# Patient Record
Sex: Male | Born: 1950 | Race: Black or African American | Hispanic: No | Marital: Married | Smoking: Never smoker
Health system: Southern US, Community
[De-identification: ages and names within clinical notes are randomized; demographics above are authoritative.]

## PROBLEM LIST (undated history)

## (undated) DIAGNOSIS — I639 Cerebral infarction, unspecified: Secondary | ICD-10-CM

## (undated) DIAGNOSIS — R569 Unspecified convulsions: Secondary | ICD-10-CM

## (undated) DIAGNOSIS — I10 Essential (primary) hypertension: Secondary | ICD-10-CM

## (undated) DIAGNOSIS — G931 Anoxic brain damage, not elsewhere classified: Secondary | ICD-10-CM

## (undated) DIAGNOSIS — J811 Chronic pulmonary edema: Secondary | ICD-10-CM

## (undated) DIAGNOSIS — I82409 Acute embolism and thrombosis of unspecified deep veins of unspecified lower extremity: Secondary | ICD-10-CM

## (undated) DIAGNOSIS — N189 Chronic kidney disease, unspecified: Secondary | ICD-10-CM

## (undated) DIAGNOSIS — M109 Gout, unspecified: Secondary | ICD-10-CM

## (undated) DIAGNOSIS — G8192 Hemiplegia, unspecified affecting left dominant side: Secondary | ICD-10-CM

## (undated) DIAGNOSIS — D649 Anemia, unspecified: Secondary | ICD-10-CM

## (undated) DIAGNOSIS — D472 Monoclonal gammopathy: Secondary | ICD-10-CM

## (undated) DIAGNOSIS — Z8673 Personal history of transient ischemic attack (TIA), and cerebral infarction without residual deficits: Secondary | ICD-10-CM

## (undated) DIAGNOSIS — D696 Thrombocytopenia, unspecified: Secondary | ICD-10-CM

## (undated) DIAGNOSIS — E785 Hyperlipidemia, unspecified: Secondary | ICD-10-CM

## (undated) DIAGNOSIS — R1310 Dysphagia, unspecified: Secondary | ICD-10-CM

## (undated) DIAGNOSIS — F1010 Alcohol abuse, uncomplicated: Secondary | ICD-10-CM

## (undated) HISTORY — DX: Anoxic brain damage, not elsewhere classified: G93.1

## (undated) HISTORY — DX: Dysphagia, unspecified: R13.10

## (undated) HISTORY — DX: Hemiplegia, unspecified affecting left dominant side: G81.92

## (undated) HISTORY — DX: Anemia, unspecified: D64.9

## (undated) HISTORY — DX: Thrombocytopenia, unspecified: D69.6

## (undated) HISTORY — DX: Chronic pulmonary edema: J81.1

## (undated) HISTORY — DX: Chronic kidney disease, unspecified: N18.9

## (undated) HISTORY — PX: OTHER SURGICAL HISTORY: SHX169

## (undated) HISTORY — DX: Unspecified convulsions: R56.9

## (undated) HISTORY — DX: Personal history of transient ischemic attack (TIA), and cerebral infarction without residual deficits: Z86.73

## (undated) HISTORY — DX: Cerebral infarction, unspecified: I63.9

---

## 1999-02-20 ENCOUNTER — Encounter: Admission: RE | Admit: 1999-02-20 | Discharge: 1999-02-20 | Payer: Self-pay | Admitting: Family Medicine

## 1999-03-03 ENCOUNTER — Encounter: Admission: RE | Admit: 1999-03-03 | Discharge: 1999-03-03 | Payer: Self-pay | Admitting: Family Medicine

## 1999-12-04 ENCOUNTER — Encounter: Admission: RE | Admit: 1999-12-04 | Discharge: 1999-12-04 | Payer: Self-pay | Admitting: Family Medicine

## 1999-12-21 ENCOUNTER — Encounter: Admission: RE | Admit: 1999-12-21 | Discharge: 1999-12-21 | Payer: Self-pay | Admitting: Family Medicine

## 2000-01-04 ENCOUNTER — Encounter: Admission: RE | Admit: 2000-01-04 | Discharge: 2000-01-04 | Payer: Self-pay | Admitting: Family Medicine

## 2000-03-28 ENCOUNTER — Encounter: Admission: RE | Admit: 2000-03-28 | Discharge: 2000-03-28 | Payer: Self-pay | Admitting: Family Medicine

## 2000-04-11 ENCOUNTER — Encounter: Admission: RE | Admit: 2000-04-11 | Discharge: 2000-04-11 | Payer: Self-pay | Admitting: Family Medicine

## 2000-04-18 ENCOUNTER — Encounter: Admission: RE | Admit: 2000-04-18 | Discharge: 2000-04-18 | Payer: Self-pay | Admitting: Family Medicine

## 2001-03-28 ENCOUNTER — Encounter: Admission: RE | Admit: 2001-03-28 | Discharge: 2001-03-28 | Payer: Self-pay | Admitting: Sports Medicine

## 2002-01-22 ENCOUNTER — Encounter: Admission: RE | Admit: 2002-01-22 | Discharge: 2002-01-22 | Payer: Self-pay | Admitting: Sports Medicine

## 2002-07-18 ENCOUNTER — Encounter: Admission: RE | Admit: 2002-07-18 | Discharge: 2002-07-18 | Payer: Self-pay | Admitting: Sports Medicine

## 2002-10-11 ENCOUNTER — Encounter: Admission: RE | Admit: 2002-10-11 | Discharge: 2002-10-11 | Payer: Self-pay | Admitting: Family Medicine

## 2002-11-12 ENCOUNTER — Encounter: Admission: RE | Admit: 2002-11-12 | Discharge: 2002-11-12 | Payer: Self-pay | Admitting: Family Medicine

## 2002-11-19 ENCOUNTER — Encounter: Admission: RE | Admit: 2002-11-19 | Discharge: 2002-11-19 | Payer: Self-pay | Admitting: Family Medicine

## 2003-09-16 ENCOUNTER — Encounter: Admission: RE | Admit: 2003-09-16 | Discharge: 2003-09-16 | Payer: Self-pay | Admitting: Family Medicine

## 2004-01-02 ENCOUNTER — Encounter: Admission: RE | Admit: 2004-01-02 | Discharge: 2004-01-02 | Payer: Self-pay | Admitting: Family Medicine

## 2004-08-27 ENCOUNTER — Ambulatory Visit: Payer: Self-pay | Admitting: Family Medicine

## 2005-03-08 ENCOUNTER — Ambulatory Visit: Payer: Self-pay | Admitting: Family Medicine

## 2005-03-22 ENCOUNTER — Ambulatory Visit: Payer: Self-pay | Admitting: Family Medicine

## 2005-08-30 ENCOUNTER — Ambulatory Visit: Payer: Self-pay | Admitting: Family Medicine

## 2006-08-12 ENCOUNTER — Ambulatory Visit: Payer: Self-pay | Admitting: Family Medicine

## 2006-09-02 ENCOUNTER — Ambulatory Visit: Payer: Self-pay | Admitting: Family Medicine

## 2006-09-07 ENCOUNTER — Ambulatory Visit: Payer: Self-pay | Admitting: Sports Medicine

## 2006-11-24 DIAGNOSIS — I10 Essential (primary) hypertension: Secondary | ICD-10-CM | POA: Insufficient documentation

## 2006-11-24 DIAGNOSIS — M109 Gout, unspecified: Secondary | ICD-10-CM | POA: Insufficient documentation

## 2006-11-24 DIAGNOSIS — K5730 Diverticulosis of large intestine without perforation or abscess without bleeding: Secondary | ICD-10-CM | POA: Insufficient documentation

## 2006-11-24 DIAGNOSIS — E785 Hyperlipidemia, unspecified: Secondary | ICD-10-CM | POA: Insufficient documentation

## 2006-11-24 DIAGNOSIS — K649 Unspecified hemorrhoids: Secondary | ICD-10-CM | POA: Insufficient documentation

## 2007-01-04 ENCOUNTER — Ambulatory Visit: Payer: Self-pay | Admitting: Sports Medicine

## 2007-01-04 DIAGNOSIS — N390 Urinary tract infection, site not specified: Secondary | ICD-10-CM | POA: Insufficient documentation

## 2007-01-04 DIAGNOSIS — N400 Enlarged prostate without lower urinary tract symptoms: Secondary | ICD-10-CM | POA: Insufficient documentation

## 2007-01-09 ENCOUNTER — Encounter: Admission: RE | Admit: 2007-01-09 | Discharge: 2007-01-09 | Payer: Self-pay | Admitting: Sports Medicine

## 2007-01-10 ENCOUNTER — Telehealth: Payer: Self-pay | Admitting: *Deleted

## 2007-06-05 ENCOUNTER — Encounter (INDEPENDENT_AMBULATORY_CARE_PROVIDER_SITE_OTHER): Payer: Self-pay | Admitting: Family Medicine

## 2007-06-05 LAB — CONVERTED CEMR LAB
ALT: 20 units/L
AST: 28 units/L
Creatinine, Ser: 1.2 mg/dL
HCT: 35.5 %
Hemoglobin: 12.2 g/dL

## 2007-08-04 ENCOUNTER — Telehealth (INDEPENDENT_AMBULATORY_CARE_PROVIDER_SITE_OTHER): Payer: Self-pay | Admitting: Family Medicine

## 2007-08-30 ENCOUNTER — Encounter (INDEPENDENT_AMBULATORY_CARE_PROVIDER_SITE_OTHER): Payer: Self-pay | Admitting: Family Medicine

## 2012-12-04 ENCOUNTER — Ambulatory Visit
Admission: RE | Admit: 2012-12-04 | Discharge: 2012-12-04 | Disposition: A | Discharge status: Home / Self Care | Payer: PRIVATE HEALTH INSURANCE | Source: Ambulatory Visit | Attending: Internal Medicine | Admitting: Internal Medicine

## 2012-12-04 ENCOUNTER — Other Ambulatory Visit: Payer: Self-pay | Admitting: Internal Medicine

## 2012-12-04 DIAGNOSIS — R609 Edema, unspecified: Secondary | ICD-10-CM

## 2013-07-19 ENCOUNTER — Ambulatory Visit
Admission: RE | Admit: 2013-07-19 | Discharge: 2013-07-19 | Disposition: A | Discharge status: Home / Self Care | Payer: PRIVATE HEALTH INSURANCE | Source: Ambulatory Visit | Attending: Family Medicine | Admitting: Family Medicine

## 2013-07-19 ENCOUNTER — Other Ambulatory Visit: Payer: Self-pay | Admitting: Family Medicine

## 2013-07-19 DIAGNOSIS — Z86718 Personal history of other venous thrombosis and embolism: Secondary | ICD-10-CM

## 2014-10-31 DIAGNOSIS — H2513 Age-related nuclear cataract, bilateral: Secondary | ICD-10-CM | POA: Diagnosis not present

## 2014-10-31 DIAGNOSIS — H4011X1 Primary open-angle glaucoma, mild stage: Secondary | ICD-10-CM | POA: Diagnosis not present

## 2014-11-25 DIAGNOSIS — Z7901 Long term (current) use of anticoagulants: Secondary | ICD-10-CM | POA: Diagnosis not present

## 2014-11-25 DIAGNOSIS — I10 Essential (primary) hypertension: Secondary | ICD-10-CM | POA: Diagnosis not present

## 2014-11-25 DIAGNOSIS — N182 Chronic kidney disease, stage 2 (mild): Secondary | ICD-10-CM | POA: Diagnosis not present

## 2014-12-17 DIAGNOSIS — R799 Abnormal finding of blood chemistry, unspecified: Secondary | ICD-10-CM | POA: Diagnosis not present

## 2014-12-17 DIAGNOSIS — Z79899 Other long term (current) drug therapy: Secondary | ICD-10-CM | POA: Diagnosis not present

## 2014-12-17 DIAGNOSIS — M150 Primary generalized (osteo)arthritis: Secondary | ICD-10-CM | POA: Diagnosis not present

## 2014-12-17 DIAGNOSIS — M1A09X1 Idiopathic chronic gout, multiple sites, with tophus (tophi): Secondary | ICD-10-CM | POA: Diagnosis not present

## 2014-12-17 DIAGNOSIS — N189 Chronic kidney disease, unspecified: Secondary | ICD-10-CM | POA: Diagnosis not present

## 2014-12-17 DIAGNOSIS — M5136 Other intervertebral disc degeneration, lumbar region: Secondary | ICD-10-CM | POA: Diagnosis not present

## 2014-12-17 DIAGNOSIS — Z789 Other specified health status: Secondary | ICD-10-CM | POA: Diagnosis not present

## 2015-01-21 DIAGNOSIS — M1A9XX1 Chronic gout, unspecified, with tophus (tophi): Secondary | ICD-10-CM | POA: Diagnosis not present

## 2015-01-28 DIAGNOSIS — I129 Hypertensive chronic kidney disease with stage 1 through stage 4 chronic kidney disease, or unspecified chronic kidney disease: Secondary | ICD-10-CM | POA: Diagnosis not present

## 2015-01-28 DIAGNOSIS — N182 Chronic kidney disease, stage 2 (mild): Secondary | ICD-10-CM | POA: Diagnosis not present

## 2015-01-28 DIAGNOSIS — I10 Essential (primary) hypertension: Secondary | ICD-10-CM | POA: Diagnosis not present

## 2015-02-10 DIAGNOSIS — D649 Anemia, unspecified: Secondary | ICD-10-CM | POA: Diagnosis not present

## 2015-02-10 DIAGNOSIS — I10 Essential (primary) hypertension: Secondary | ICD-10-CM | POA: Diagnosis not present

## 2015-02-10 DIAGNOSIS — N183 Chronic kidney disease, stage 3 (moderate): Secondary | ICD-10-CM | POA: Diagnosis not present

## 2015-03-18 DIAGNOSIS — N183 Chronic kidney disease, stage 3 (moderate): Secondary | ICD-10-CM | POA: Diagnosis not present

## 2015-03-18 DIAGNOSIS — I10 Essential (primary) hypertension: Secondary | ICD-10-CM | POA: Diagnosis not present

## 2015-03-18 DIAGNOSIS — R197 Diarrhea, unspecified: Secondary | ICD-10-CM | POA: Diagnosis not present

## 2015-03-20 DIAGNOSIS — R197 Diarrhea, unspecified: Secondary | ICD-10-CM | POA: Diagnosis not present

## 2015-03-26 DIAGNOSIS — E785 Hyperlipidemia, unspecified: Secondary | ICD-10-CM | POA: Diagnosis not present

## 2015-03-26 DIAGNOSIS — E786 Lipoprotein deficiency: Secondary | ICD-10-CM | POA: Diagnosis not present

## 2015-03-26 DIAGNOSIS — R197 Diarrhea, unspecified: Secondary | ICD-10-CM | POA: Diagnosis not present

## 2015-04-23 DIAGNOSIS — M1A09X1 Idiopathic chronic gout, multiple sites, with tophus (tophi): Secondary | ICD-10-CM | POA: Diagnosis not present

## 2015-04-23 DIAGNOSIS — N189 Chronic kidney disease, unspecified: Secondary | ICD-10-CM | POA: Diagnosis not present

## 2015-04-23 DIAGNOSIS — M150 Primary generalized (osteo)arthritis: Secondary | ICD-10-CM | POA: Diagnosis not present

## 2015-04-23 DIAGNOSIS — M5136 Other intervertebral disc degeneration, lumbar region: Secondary | ICD-10-CM | POA: Diagnosis not present

## 2015-06-30 DIAGNOSIS — R197 Diarrhea, unspecified: Secondary | ICD-10-CM | POA: Diagnosis not present

## 2015-06-30 DIAGNOSIS — R195 Other fecal abnormalities: Secondary | ICD-10-CM | POA: Diagnosis not present

## 2015-06-30 DIAGNOSIS — Z23 Encounter for immunization: Secondary | ICD-10-CM | POA: Diagnosis not present

## 2015-06-30 DIAGNOSIS — I10 Essential (primary) hypertension: Secondary | ICD-10-CM | POA: Diagnosis not present

## 2015-07-08 ENCOUNTER — Inpatient Hospital Stay (HOSPITAL_COMMUNITY): Payer: Medicare Other

## 2015-07-08 ENCOUNTER — Inpatient Hospital Stay (HOSPITAL_COMMUNITY)
Admission: EM | Admit: 2015-07-08 | Discharge: 2015-07-19 | DRG: 308 | Disposition: A | Discharge status: Skilled Nursing Facility | Payer: Medicare Other | Attending: Internal Medicine | Admitting: Internal Medicine

## 2015-07-08 ENCOUNTER — Emergency Department (HOSPITAL_COMMUNITY): Payer: Medicare Other

## 2015-07-08 ENCOUNTER — Encounter (HOSPITAL_COMMUNITY): Payer: Self-pay | Admitting: Emergency Medicine

## 2015-07-08 DIAGNOSIS — M1000 Idiopathic gout, unspecified site: Secondary | ICD-10-CM | POA: Diagnosis present

## 2015-07-08 DIAGNOSIS — R9431 Abnormal electrocardiogram [ECG] [EKG]: Secondary | ICD-10-CM | POA: Diagnosis not present

## 2015-07-08 DIAGNOSIS — J9691 Respiratory failure, unspecified with hypoxia: Secondary | ICD-10-CM | POA: Diagnosis not present

## 2015-07-08 DIAGNOSIS — Z4659 Encounter for fitting and adjustment of other gastrointestinal appliance and device: Secondary | ICD-10-CM

## 2015-07-08 DIAGNOSIS — J189 Pneumonia, unspecified organism: Secondary | ICD-10-CM | POA: Diagnosis present

## 2015-07-08 DIAGNOSIS — F1010 Alcohol abuse, uncomplicated: Secondary | ICD-10-CM | POA: Diagnosis present

## 2015-07-08 DIAGNOSIS — G931 Anoxic brain damage, not elsewhere classified: Secondary | ICD-10-CM | POA: Diagnosis present

## 2015-07-08 DIAGNOSIS — R1310 Dysphagia, unspecified: Secondary | ICD-10-CM | POA: Diagnosis not present

## 2015-07-08 DIAGNOSIS — J810 Acute pulmonary edema: Secondary | ICD-10-CM | POA: Diagnosis not present

## 2015-07-08 DIAGNOSIS — I639 Cerebral infarction, unspecified: Secondary | ICD-10-CM

## 2015-07-08 DIAGNOSIS — Z888 Allergy status to other drugs, medicaments and biological substances status: Secondary | ICD-10-CM | POA: Diagnosis not present

## 2015-07-08 DIAGNOSIS — I429 Cardiomyopathy, unspecified: Secondary | ICD-10-CM | POA: Diagnosis present

## 2015-07-08 DIAGNOSIS — N179 Acute kidney failure, unspecified: Secondary | ICD-10-CM | POA: Diagnosis not present

## 2015-07-08 DIAGNOSIS — D696 Thrombocytopenia, unspecified: Secondary | ICD-10-CM | POA: Diagnosis present

## 2015-07-08 DIAGNOSIS — G9340 Encephalopathy, unspecified: Secondary | ICD-10-CM | POA: Diagnosis not present

## 2015-07-08 DIAGNOSIS — Y848 Other medical procedures as the cause of abnormal reaction of the patient, or of later complication, without mention of misadventure at the time of the procedure: Secondary | ICD-10-CM | POA: Diagnosis present

## 2015-07-08 DIAGNOSIS — I468 Cardiac arrest due to other underlying condition: Secondary | ICD-10-CM | POA: Diagnosis present

## 2015-07-08 DIAGNOSIS — F17200 Nicotine dependence, unspecified, uncomplicated: Secondary | ICD-10-CM | POA: Diagnosis present

## 2015-07-08 DIAGNOSIS — E785 Hyperlipidemia, unspecified: Secondary | ICD-10-CM | POA: Diagnosis not present

## 2015-07-08 DIAGNOSIS — Z86718 Personal history of other venous thrombosis and embolism: Secondary | ICD-10-CM | POA: Diagnosis not present

## 2015-07-08 DIAGNOSIS — Z9289 Personal history of other medical treatment: Secondary | ICD-10-CM

## 2015-07-08 DIAGNOSIS — R0902 Hypoxemia: Secondary | ICD-10-CM

## 2015-07-08 DIAGNOSIS — I5022 Chronic systolic (congestive) heart failure: Secondary | ICD-10-CM | POA: Diagnosis present

## 2015-07-08 DIAGNOSIS — T8182XA Emphysema (subcutaneous) resulting from a procedure, initial encounter: Secondary | ICD-10-CM | POA: Diagnosis not present

## 2015-07-08 DIAGNOSIS — G8194 Hemiplegia, unspecified affecting left nondominant side: Secondary | ICD-10-CM | POA: Diagnosis not present

## 2015-07-08 DIAGNOSIS — E872 Acidosis: Secondary | ICD-10-CM | POA: Diagnosis present

## 2015-07-08 DIAGNOSIS — Y92019 Unspecified place in single-family (private) house as the place of occurrence of the external cause: Secondary | ICD-10-CM

## 2015-07-08 DIAGNOSIS — I10 Essential (primary) hypertension: Secondary | ICD-10-CM | POA: Diagnosis present

## 2015-07-08 DIAGNOSIS — J9600 Acute respiratory failure, unspecified whether with hypoxia or hypercapnia: Secondary | ICD-10-CM | POA: Diagnosis not present

## 2015-07-08 DIAGNOSIS — D62 Acute posthemorrhagic anemia: Secondary | ICD-10-CM | POA: Diagnosis present

## 2015-07-08 DIAGNOSIS — I469 Cardiac arrest, cause unspecified: Secondary | ICD-10-CM

## 2015-07-08 DIAGNOSIS — E870 Hyperosmolality and hypernatremia: Secondary | ICD-10-CM | POA: Diagnosis not present

## 2015-07-08 DIAGNOSIS — S11023A Puncture wound without foreign body of trachea, initial encounter: Secondary | ICD-10-CM | POA: Diagnosis present

## 2015-07-08 DIAGNOSIS — Z79899 Other long term (current) drug therapy: Secondary | ICD-10-CM | POA: Diagnosis not present

## 2015-07-08 DIAGNOSIS — I4901 Ventricular fibrillation: Secondary | ICD-10-CM | POA: Diagnosis not present

## 2015-07-08 DIAGNOSIS — M109 Gout, unspecified: Secondary | ICD-10-CM | POA: Diagnosis present

## 2015-07-08 DIAGNOSIS — Z452 Encounter for adjustment and management of vascular access device: Secondary | ICD-10-CM | POA: Diagnosis not present

## 2015-07-08 DIAGNOSIS — Z7901 Long term (current) use of anticoagulants: Secondary | ICD-10-CM

## 2015-07-08 DIAGNOSIS — Z4682 Encounter for fitting and adjustment of non-vascular catheter: Secondary | ICD-10-CM | POA: Diagnosis not present

## 2015-07-08 DIAGNOSIS — Q321 Other congenital malformations of trachea: Secondary | ICD-10-CM | POA: Diagnosis not present

## 2015-07-08 DIAGNOSIS — E876 Hypokalemia: Secondary | ICD-10-CM | POA: Diagnosis not present

## 2015-07-08 DIAGNOSIS — N170 Acute kidney failure with tubular necrosis: Secondary | ICD-10-CM | POA: Diagnosis not present

## 2015-07-08 DIAGNOSIS — J9601 Acute respiratory failure with hypoxia: Secondary | ICD-10-CM | POA: Diagnosis not present

## 2015-07-08 DIAGNOSIS — T797XXA Traumatic subcutaneous emphysema, initial encounter: Secondary | ICD-10-CM | POA: Diagnosis not present

## 2015-07-08 DIAGNOSIS — R4182 Altered mental status, unspecified: Secondary | ICD-10-CM | POA: Diagnosis not present

## 2015-07-08 DIAGNOSIS — K7200 Acute and subacute hepatic failure without coma: Secondary | ICD-10-CM | POA: Diagnosis present

## 2015-07-08 DIAGNOSIS — J811 Chronic pulmonary edema: Secondary | ICD-10-CM

## 2015-07-08 DIAGNOSIS — J681 Pulmonary edema due to chemicals, gases, fumes and vapors: Secondary | ICD-10-CM | POA: Diagnosis not present

## 2015-07-08 DIAGNOSIS — I130 Hypertensive heart and chronic kidney disease with heart failure and stage 1 through stage 4 chronic kidney disease, or unspecified chronic kidney disease: Secondary | ICD-10-CM | POA: Diagnosis present

## 2015-07-08 DIAGNOSIS — J398 Other specified diseases of upper respiratory tract: Secondary | ICD-10-CM | POA: Diagnosis present

## 2015-07-08 DIAGNOSIS — T884XXA Failed or difficult intubation, initial encounter: Secondary | ICD-10-CM | POA: Diagnosis present

## 2015-07-08 DIAGNOSIS — R092 Respiratory arrest: Secondary | ICD-10-CM | POA: Diagnosis not present

## 2015-07-08 DIAGNOSIS — E7800 Pure hypercholesterolemia, unspecified: Secondary | ICD-10-CM | POA: Diagnosis not present

## 2015-07-08 DIAGNOSIS — J690 Pneumonitis due to inhalation of food and vomit: Secondary | ICD-10-CM | POA: Diagnosis not present

## 2015-07-08 HISTORY — DX: Essential (primary) hypertension: I10

## 2015-07-08 HISTORY — DX: Gout, unspecified: M10.9

## 2015-07-08 HISTORY — DX: Hyperlipidemia, unspecified: E78.5

## 2015-07-08 HISTORY — DX: Cardiac arrest, cause unspecified: I46.9

## 2015-07-08 HISTORY — DX: Alcohol abuse, uncomplicated: F10.10

## 2015-07-08 LAB — CBC WITH DIFFERENTIAL/PLATELET
BASOS ABS: 0 10*3/uL (ref 0.0–0.1)
Basophils Relative: 0 %
EOS PCT: 1 %
Eosinophils Absolute: 0.1 10*3/uL (ref 0.0–0.7)
HCT: 32.8 % — ABNORMAL LOW (ref 39.0–52.0)
Hemoglobin: 10.9 g/dL — ABNORMAL LOW (ref 13.0–17.0)
LYMPHS PCT: 35 %
Lymphs Abs: 3.1 10*3/uL (ref 0.7–4.0)
MCH: 31.7 pg (ref 26.0–34.0)
MCHC: 33.2 g/dL (ref 30.0–36.0)
MCV: 95.3 fL (ref 78.0–100.0)
MONO ABS: 0.6 10*3/uL (ref 0.1–1.0)
Monocytes Relative: 7 %
Neutro Abs: 4.9 10*3/uL (ref 1.7–7.7)
Neutrophils Relative %: 57 %
PLATELETS: 131 10*3/uL — AB (ref 150–400)
RBC: 3.44 MIL/uL — ABNORMAL LOW (ref 4.22–5.81)
RDW: 14.5 % (ref 11.5–15.5)
WBC: 8.7 10*3/uL (ref 4.0–10.5)

## 2015-07-08 LAB — COMPREHENSIVE METABOLIC PANEL
ALT: 34 U/L (ref 17–63)
ANION GAP: 16 — AB (ref 5–15)
AST: 229 U/L — AB (ref 15–41)
Albumin: 2.6 g/dL — ABNORMAL LOW (ref 3.5–5.0)
Alkaline Phosphatase: 99 U/L (ref 38–126)
BUN: 8 mg/dL (ref 6–20)
CHLORIDE: 105 mmol/L (ref 101–111)
CO2: 19 mmol/L — ABNORMAL LOW (ref 22–32)
Calcium: 8.1 mg/dL — ABNORMAL LOW (ref 8.9–10.3)
Creatinine, Ser: 1.65 mg/dL — ABNORMAL HIGH (ref 0.61–1.24)
GFR, EST AFRICAN AMERICAN: 49 mL/min — AB (ref >60)
GFR, EST NON AFRICAN AMERICAN: 42 mL/min — AB (ref >60)
Glucose, Bld: 160 mg/dL — ABNORMAL HIGH (ref 65–99)
POTASSIUM: 3.7 mmol/L (ref 3.5–5.1)
Sodium: 140 mmol/L (ref 135–145)
Total Bilirubin: 0.7 mg/dL (ref 0.3–1.2)
Total Protein: 6.2 g/dL — ABNORMAL LOW (ref 6.5–8.1)

## 2015-07-08 LAB — I-STAT CHEM 8, ED
BUN: 8 mg/dL (ref 6–20)
CALCIUM ION: 1.03 mmol/L — AB (ref 1.13–1.30)
Chloride: 107 mmol/L (ref 101–111)
Creatinine, Ser: 1.6 mg/dL — ABNORMAL HIGH (ref 0.61–1.24)
GLUCOSE: 163 mg/dL — AB (ref 65–99)
HCT: 36 % — ABNORMAL LOW (ref 39.0–52.0)
HEMOGLOBIN: 12.2 g/dL — AB (ref 13.0–17.0)
Potassium: 3.7 mmol/L (ref 3.5–5.1)
Sodium: 141 mmol/L (ref 135–145)
TCO2: 17 mmol/L (ref 0–100)

## 2015-07-08 LAB — POCT I-STAT, CHEM 8
BUN: 7 mg/dL (ref 6–20)
BUN: 6 mg/dL (ref 6–20)
BUN: 7 mg/dL (ref 6–20)
BUN: 6 mg/dL (ref 6–20)
CALCIUM ION: 1.05 mmol/L — AB (ref 1.13–1.30)
CALCIUM ION: 0.98 mmol/L — AB (ref 1.13–1.30)
CHLORIDE: 112 mmol/L — AB (ref 101–111)
CHLORIDE: 110 mmol/L (ref 101–111)
CREATININE: 1.3 mg/dL — AB (ref 0.61–1.24)
CREATININE: 1 mg/dL (ref 0.61–1.24)
Calcium, Ion: 1.01 mmol/L — ABNORMAL LOW (ref 1.13–1.30)
Calcium, Ion: 1 mmol/L — ABNORMAL LOW (ref 1.13–1.30)
Chloride: 108 mmol/L (ref 101–111)
Chloride: 115 mmol/L — ABNORMAL HIGH (ref 101–111)
Creatinine, Ser: 1 mg/dL (ref 0.61–1.24)
Creatinine, Ser: 1 mg/dL (ref 0.61–1.24)
Glucose, Bld: 119 mg/dL — ABNORMAL HIGH (ref 65–99)
Glucose, Bld: 199 mg/dL — ABNORMAL HIGH (ref 65–99)
Glucose, Bld: 148 mg/dL — ABNORMAL HIGH (ref 65–99)
Glucose, Bld: 114 mg/dL — ABNORMAL HIGH (ref 65–99)
HCT: 37 % — ABNORMAL LOW (ref 39.0–52.0)
HCT: 35 % — ABNORMAL LOW (ref 39.0–52.0)
HEMATOCRIT: 33 % — AB (ref 39.0–52.0)
HEMATOCRIT: 36 % — AB (ref 39.0–52.0)
HEMOGLOBIN: 11.2 g/dL — AB (ref 13.0–17.0)
Hemoglobin: 12.6 g/dL — ABNORMAL LOW (ref 13.0–17.0)
Hemoglobin: 12.2 g/dL — ABNORMAL LOW (ref 13.0–17.0)
Hemoglobin: 11.9 g/dL — ABNORMAL LOW (ref 13.0–17.0)
POTASSIUM: 4.2 mmol/L (ref 3.5–5.1)
POTASSIUM: 3.6 mmol/L (ref 3.5–5.1)
Potassium: 3.6 mmol/L (ref 3.5–5.1)
Potassium: 3.4 mmol/L — ABNORMAL LOW (ref 3.5–5.1)
SODIUM: 141 mmol/L (ref 135–145)
SODIUM: 142 mmol/L (ref 135–145)
SODIUM: 139 mmol/L (ref 135–145)
Sodium: 142 mmol/L (ref 135–145)
TCO2: 18 mmol/L (ref 0–100)
TCO2: 15 mmol/L (ref 0–100)
TCO2: 14 mmol/L (ref 0–100)
TCO2: 16 mmol/L (ref 0–100)

## 2015-07-08 LAB — I-STAT ARTERIAL BLOOD GAS, ED
Acid-base deficit: 8 mmol/L — ABNORMAL HIGH (ref 0.0–2.0)
Bicarbonate: 19.4 mEq/L — ABNORMAL LOW (ref 20.0–24.0)
O2 Saturation: 100 %
PCO2 ART: 44.7 mmHg (ref 35.0–45.0)
TCO2: 21 mmol/L (ref 0–100)
pH, Arterial: 7.247 — ABNORMAL LOW (ref 7.350–7.450)
pO2, Arterial: 347 mmHg — ABNORMAL HIGH (ref 80.0–100.0)

## 2015-07-08 LAB — APTT: aPTT: 34 seconds (ref 24–37)

## 2015-07-08 LAB — BLOOD GAS, ARTERIAL
Acid-base deficit: 11.7 mmol/L — ABNORMAL HIGH (ref 0.0–2.0)
BICARBONATE: 12.6 meq/L — AB (ref 20.0–24.0)
Drawn by: 338991
FIO2: 0.5
MECHVT: 550 mL
O2 Saturation: 99.3 %
PATIENT TEMPERATURE: 95.5
PEEP: 5 cmH2O
RATE: 28 resp/min
TCO2: 13.3 mmol/L (ref 0–100)
pCO2 arterial: 20.4 mmHg — ABNORMAL LOW (ref 35.0–45.0)
pH, Arterial: 7.397 (ref 7.350–7.450)
pO2, Arterial: 218 mmHg — ABNORMAL HIGH (ref 80.0–100.0)

## 2015-07-08 LAB — PROTIME-INR
INR: 1.25 (ref 0.00–1.49)
INR: 1.32 (ref 0.00–1.49)
PROTHROMBIN TIME: 16.5 s — AB (ref 11.6–15.2)
Prothrombin Time: 15.8 seconds — ABNORMAL HIGH (ref 11.6–15.2)

## 2015-07-08 LAB — BASIC METABOLIC PANEL
Anion gap: 13 (ref 5–15)
BUN: 7 mg/dL (ref 6–20)
CHLORIDE: 106 mmol/L (ref 101–111)
CO2: 17 mmol/L — ABNORMAL LOW (ref 22–32)
Calcium: 7 mg/dL — ABNORMAL LOW (ref 8.9–10.3)
Creatinine, Ser: 1.25 mg/dL — ABNORMAL HIGH (ref 0.61–1.24)
GFR calc Af Amer: >60 mL/min (ref >60)
GFR calc non Af Amer: 59 mL/min — ABNORMAL LOW (ref >60)
GLUCOSE: 113 mg/dL — AB (ref 65–99)
POTASSIUM: 3.1 mmol/L — AB (ref 3.5–5.1)
Sodium: 136 mmol/L (ref 135–145)

## 2015-07-08 LAB — TROPONIN I
TROPONIN I: 0.37 ng/mL — AB (ref <0.031)
Troponin I: <0.03 ng/mL (ref <0.031)

## 2015-07-08 LAB — MAGNESIUM: MAGNESIUM: 1 mg/dL — AB (ref 1.7–2.4)

## 2015-07-08 LAB — GLUCOSE, CAPILLARY
GLUCOSE-CAPILLARY: 164 mg/dL — AB (ref 65–99)
GLUCOSE-CAPILLARY: 164 mg/dL — AB (ref 65–99)
GLUCOSE-CAPILLARY: 108 mg/dL — AB (ref 65–99)
Glucose-Capillary: 113 mg/dL — ABNORMAL HIGH (ref 65–99)
Glucose-Capillary: 155 mg/dL — ABNORMAL HIGH (ref 65–99)
Glucose-Capillary: 131 mg/dL — ABNORMAL HIGH (ref 65–99)
Glucose-Capillary: 99 mg/dL (ref 65–99)
Glucose-Capillary: 102 mg/dL — ABNORMAL HIGH (ref 65–99)
Glucose-Capillary: 108 mg/dL — ABNORMAL HIGH (ref 65–99)

## 2015-07-08 LAB — I-STAT CG4 LACTIC ACID, ED: LACTIC ACID, VENOUS: 6.4 mmol/L — AB (ref 0.5–2.0)

## 2015-07-08 LAB — RAPID URINE DRUG SCREEN, HOSP PERFORMED
AMPHETAMINES: NOT DETECTED
BARBITURATES: NOT DETECTED
Benzodiazepines: NOT DETECTED
Cocaine: NOT DETECTED
Opiates: NOT DETECTED
Tetrahydrocannabinol: NOT DETECTED

## 2015-07-08 LAB — URINALYSIS, ROUTINE W REFLEX MICROSCOPIC
Bilirubin Urine: NEGATIVE
Glucose, UA: 100 mg/dL — AB
KETONES UR: NEGATIVE mg/dL
LEUKOCYTES UA: NEGATIVE
Nitrite: NEGATIVE
Specific Gravity, Urine: 1.012 (ref 1.005–1.030)
UROBILINOGEN UA: 0.2 mg/dL (ref 0.0–1.0)
pH: 6.5 (ref 5.0–8.0)

## 2015-07-08 LAB — ETHANOL

## 2015-07-08 LAB — I-STAT TROPONIN, ED: TROPONIN I, POC: 0 ng/mL (ref 0.00–0.08)

## 2015-07-08 LAB — CBG MONITORING, ED: GLUCOSE-CAPILLARY: 158 mg/dL — AB (ref 65–99)

## 2015-07-08 LAB — URINE MICROSCOPIC-ADD ON

## 2015-07-08 LAB — HEPARIN LEVEL (UNFRACTIONATED): HEPARIN UNFRACTIONATED: 0.56 [IU]/mL (ref 0.30–0.70)

## 2015-07-08 LAB — SAMPLE TO BLOOD BANK

## 2015-07-08 LAB — MRSA PCR SCREENING: MRSA by PCR: NEGATIVE

## 2015-07-08 LAB — TRIGLYCERIDES: Triglycerides: 75 mg/dL (ref <150)

## 2015-07-08 MED ORDER — PROPOFOL 1000 MG/100ML IV EMUL
INTRAVENOUS | Status: AC
Start: 2015-07-08 — End: 2015-07-08
  Filled 2015-07-08: qty 100

## 2015-07-08 MED ORDER — INFLUENZA VAC SPLIT QUAD 0.5 ML IM SUSY
0.5000 mL | PREFILLED_SYRINGE | INTRAMUSCULAR | Status: DC
  Filled 2015-07-08: qty 0.5

## 2015-07-08 MED ORDER — SODIUM CHLORIDE 0.9 % IJ SOLN
10.0000 mL | Freq: Two times a day (BID) | INTRAMUSCULAR | Status: DC
  Administered 2015-07-08 – 2015-07-18 (×19): 10 mL

## 2015-07-08 MED ORDER — SUCCINYLCHOLINE CHLORIDE 20 MG/ML IJ SOLN
1.5000 mg/kg | Freq: Once | INTRAMUSCULAR | Status: DC
  Administered 2015-07-08: 100 mg via INTRAVENOUS

## 2015-07-08 MED ORDER — ASPIRIN 300 MG RE SUPP
300.0000 mg | RECTAL | Status: DC
Start: 2015-07-08 — End: 2015-07-08

## 2015-07-08 MED ORDER — MIDAZOLAM BOLUS VIA INFUSION
1.0000 mg | INTRAVENOUS | Status: DC | PRN
  Filled 2015-07-08: qty 1

## 2015-07-08 MED ORDER — THIAMINE HCL 100 MG/ML IJ SOLN
100.0000 mg | Freq: Every day | INTRAMUSCULAR | Status: DC
  Administered 2015-07-08 – 2015-07-18 (×11): 100 mg via INTRAVENOUS
  Filled 2015-07-08 (×11): qty 2

## 2015-07-08 MED ORDER — ARTIFICIAL TEARS OP OINT
1.0000 "application " | TOPICAL_OINTMENT | Freq: Three times a day (TID) | OPHTHALMIC | Status: DC
  Administered 2015-07-08 – 2015-07-09 (×4): 1 via OPHTHALMIC
  Filled 2015-07-08 (×3): qty 3.5

## 2015-07-08 MED ORDER — SODIUM CHLORIDE 0.9 % IV SOLN: 2000.0000 mL | Freq: Once | INTRAVENOUS | Status: AC

## 2015-07-08 MED ORDER — SODIUM CHLORIDE 0.9 % IJ SOLN
10.0000 mL | INTRAMUSCULAR | Status: DC | PRN
  Administered 2015-07-17: 10 mL
  Filled 2015-07-08: qty 40

## 2015-07-08 MED ORDER — ASPIRIN 300 MG RE SUPP: 300.0000 mg | Freq: Every day | RECTAL | Status: DC

## 2015-07-08 MED ORDER — HEPARIN BOLUS VIA INFUSION
2500.0000 [IU] | Freq: Once | INTRAVENOUS | Status: AC
Start: 2015-07-08 — End: 2015-07-08
  Administered 2015-07-08: 2500 [IU] via INTRAVENOUS
  Filled 2015-07-08: qty 2500

## 2015-07-08 MED ORDER — SUCCINYLCHOLINE CHLORIDE 20 MG/ML IJ SOLN
INTRAMUSCULAR | Status: AC
  Administered 2015-07-08: 100 mg via INTRAVENOUS
  Filled 2015-07-08: qty 1

## 2015-07-08 MED ORDER — SODIUM CHLORIDE 0.9 % IV SOLN
1.0000 mg/h | INTRAVENOUS | Status: DC
  Administered 2015-07-08 – 2015-07-09 (×3): 4 mg/h via INTRAVENOUS
  Filled 2015-07-08 (×3): qty 10

## 2015-07-08 MED ORDER — ANTISEPTIC ORAL RINSE SOLUTION (CORINZ)
7.0000 mL | Freq: Four times a day (QID) | OROMUCOSAL | Status: DC
  Administered 2015-07-08 (×2): 7 mL via OROMUCOSAL

## 2015-07-08 MED ORDER — CISATRACURIUM BOLUS VIA INFUSION
0.0500 mg/kg | INTRAVENOUS | Status: DC | PRN
  Filled 2015-07-08: qty 4

## 2015-07-08 MED ORDER — SODIUM CHLORIDE 0.9 % IV SOLN
25.0000 ug/h | INTRAVENOUS | Status: DC
  Administered 2015-07-08: 100 ug/h via INTRAVENOUS
  Administered 2015-07-09 (×2): 200 ug/h via INTRAVENOUS
  Filled 2015-07-08 (×3): qty 50

## 2015-07-08 MED ORDER — CHLORHEXIDINE GLUCONATE 0.12% ORAL RINSE (MEDLINE KIT)
15.0000 mL | Freq: Two times a day (BID) | OROMUCOSAL | Status: DC
  Administered 2015-07-08: 15 mL via OROMUCOSAL

## 2015-07-08 MED ORDER — MIDAZOLAM HCL 2 MG/2ML IJ SOLN
1.0000 mg | INTRAMUSCULAR | Status: DC | PRN
  Administered 2015-07-10 – 2015-07-11 (×2): 2 mg via INTRAVENOUS
  Filled 2015-07-08 (×2): qty 2

## 2015-07-08 MED ORDER — SODIUM CHLORIDE 0.9 % IV SOLN
1.0000 ug/kg/min | INTRAVENOUS | Status: DC
  Administered 2015-07-08 – 2015-07-09 (×2): 1 ug/kg/min via INTRAVENOUS
  Filled 2015-07-08 (×2): qty 20

## 2015-07-08 MED ORDER — POTASSIUM CHLORIDE 10 MEQ/50ML IV SOLN
10.0000 meq | INTRAVENOUS | Status: AC
  Administered 2015-07-08 – 2015-07-09 (×3): 10 meq via INTRAVENOUS
  Filled 2015-07-08 (×3): qty 50

## 2015-07-08 MED ORDER — SODIUM CHLORIDE 0.9 % IV SOLN
2000.0000 mL | Freq: Once | INTRAVENOUS | Status: AC
  Administered 2015-07-08: 1000 mL via INTRAVENOUS

## 2015-07-08 MED ORDER — ANTISEPTIC ORAL RINSE SOLUTION (CORINZ)
7.0000 mL | OROMUCOSAL | Status: DC
  Administered 2015-07-08 – 2015-07-11 (×32): 7 mL via OROMUCOSAL

## 2015-07-08 MED ORDER — HEPARIN (PORCINE) IN NACL 100-0.45 UNIT/ML-% IJ SOLN
800.0000 [IU]/h | INTRAMUSCULAR | Status: DC
  Administered 2015-07-08: 800 [IU]/h via INTRAVENOUS
  Filled 2015-07-08: qty 250

## 2015-07-08 MED ORDER — PNEUMOCOCCAL VAC POLYVALENT 25 MCG/0.5ML IJ INJ
0.5000 mL | INJECTION | INTRAMUSCULAR | Status: AC
  Administered 2015-07-11: 0.5 mL via INTRAMUSCULAR
  Filled 2015-07-08: qty 0.5

## 2015-07-08 MED ORDER — FENTANYL CITRATE (PF) 100 MCG/2ML IJ SOLN
50.0000 ug | Freq: Once | INTRAMUSCULAR | Status: DC
  Filled 2015-07-08: qty 2

## 2015-07-08 MED ORDER — CISATRACURIUM BOLUS VIA INFUSION
0.1000 mg/kg | Freq: Once | INTRAVENOUS | Status: AC
  Administered 2015-07-08: 7.9 mg via INTRAVENOUS
  Filled 2015-07-08: qty 8

## 2015-07-08 MED ORDER — ROCURONIUM BROMIDE 50 MG/5ML IV SOLN
INTRAVENOUS | Status: AC
  Filled 2015-07-08: qty 2

## 2015-07-08 MED ORDER — ETOMIDATE 2 MG/ML IV SOLN
INTRAVENOUS | Status: AC
  Administered 2015-07-08: 20 mg via INTRAVENOUS
  Filled 2015-07-08: qty 20

## 2015-07-08 MED ORDER — AMPICILLIN-SULBACTAM SODIUM 3 (2-1) G IJ SOLR
3.0000 g | Freq: Three times a day (TID) | INTRAMUSCULAR | Status: DC
Start: 2015-07-08 — End: 2015-07-11
  Administered 2015-07-08 – 2015-07-11 (×10): 3 g via INTRAVENOUS
  Filled 2015-07-08 (×11): qty 3

## 2015-07-08 MED ORDER — NOREPINEPHRINE BITARTRATE 1 MG/ML IV SOLN
0.0000 ug/min | INTRAVENOUS | Status: DC
  Administered 2015-07-09: 4 ug/min via INTRAVENOUS
  Filled 2015-07-08 (×2): qty 4

## 2015-07-08 MED ORDER — ASPIRIN 300 MG RE SUPP
300.0000 mg | RECTAL | Status: AC
  Administered 2015-07-08: 300 mg via RECTAL
  Filled 2015-07-08: qty 1

## 2015-07-08 MED ORDER — PANTOPRAZOLE SODIUM 40 MG IV SOLR
40.0000 mg | INTRAVENOUS | Status: DC
  Administered 2015-07-08 – 2015-07-18 (×11): 40 mg via INTRAVENOUS
  Filled 2015-07-08 (×11): qty 40

## 2015-07-08 MED ORDER — ETOMIDATE 2 MG/ML IV SOLN
20.0000 mg | Freq: Once | INTRAVENOUS | Status: AC
  Administered 2015-07-08: 20 mg via INTRAVENOUS

## 2015-07-08 MED ORDER — IOHEXOL 350 MG/ML SOLN
100.0000 mL | Freq: Once | INTRAVENOUS | Status: AC | PRN
  Administered 2015-07-08: 80 mL via INTRAVENOUS

## 2015-07-08 MED ORDER — FENTANYL BOLUS VIA INFUSION
25.0000 ug | INTRAVENOUS | Status: DC | PRN
  Filled 2015-07-08: qty 25

## 2015-07-08 MED ORDER — FOLIC ACID 5 MG/ML IJ SOLN
1.0000 mg | Freq: Every day | INTRAMUSCULAR | Status: DC
  Administered 2015-07-08 – 2015-07-18 (×9): 1 mg via INTRAVENOUS
  Filled 2015-07-08 (×13): qty 0.2

## 2015-07-08 MED ORDER — CHLORHEXIDINE GLUCONATE 0.12% ORAL RINSE (MEDLINE KIT)
15.0000 mL | Freq: Two times a day (BID) | OROMUCOSAL | Status: DC
  Administered 2015-07-08 – 2015-07-18 (×17): 15 mL via OROMUCOSAL

## 2015-07-08 MED ORDER — LIDOCAINE HCL (CARDIAC) 20 MG/ML IV SOLN
INTRAVENOUS | Status: AC
  Filled 2015-07-08: qty 5

## 2015-07-08 MED ORDER — MAGNESIUM SULFATE 2 GM/50ML IV SOLN
2.0000 g | Freq: Once | INTRAVENOUS | Status: AC
  Administered 2015-07-08: 2 g via INTRAVENOUS
  Filled 2015-07-08: qty 50

## 2015-07-08 MED ORDER — MIDAZOLAM HCL 2 MG/2ML IJ SOLN: 1.0000 mg | Freq: Once | INTRAMUSCULAR | Status: DC

## 2015-07-08 MED ORDER — PROPOFOL 1000 MG/100ML IV EMUL
5.0000 ug/kg/min | Freq: Once | INTRAVENOUS | Status: AC
  Administered 2015-07-08: 5 ug/kg/min via INTRAVENOUS

## 2015-07-08 NOTE — Progress Notes (Signed)
[facility] Progress Note Patient Name: Mitchell [name] DOB: 1951-05-30 MRN: [id number]   Date of Service  07/08/2015  HPI/Events of Note  K 3.1  eICU Interventions  Repleted     Intervention Category Intermediate Interventions: Electrolyte abnormality - evaluation and management  Mitchell [name] 07/08/2015, 9:24 PM

## 2015-07-08 NOTE — Progress Notes (Signed)
Patient arrived to unit with arctic sun pads on but cooling had not been initiated by the [employer].  PIV infiltrated upon arrival therefore we were unable to begin paralytic for cooling protocol.  Remo Lipps Minor, NP is at the bedside for line placement.

## 2015-07-08 NOTE — ED Notes (Signed)
Dr. Titus Mould at bedside and speaking with family.

## 2015-07-08 NOTE — Procedures (Signed)
Central Venous Catheter Insertion Procedure Note Vansh Kubala [id number] 1950/11/06  Procedure: Insertion of Central Venous Catheter Indications: Assessment of intravascular volume, Drug and/or fluid administration and Frequent blood sampling  Procedure Details Consent: Risks of procedure as well as the alternatives and risks of each were explained to the (patient/caregiver).  Consent for procedure obtained. Time Out: Verified patient identification, verified procedure, site/side was marked, verified correct patient position, special equipment/implants available, medications/allergies/relevent history reviewed, required imaging and test results available.  Performed  Maximum sterile technique was used including antiseptics, cap, gloves, gown, hand hygiene, mask and sheet. Skin prep: Chlorhexidine; local anesthetic administered A antimicrobial bonded/coated triple lumen catheter was placed in the left internal jugular vein using the Seldinger technique. Ultrasound guidance used.Yes.   Catheter placed to 20 cm. Blood aspirated via all 3 ports and then flushed x 3. Line sutured x 2 and dressing applied.  Evaluation Blood flow good Complications: No apparent complications Patient did tolerate procedure well. Chest X-ray ordered to verify placement.  CXR: pending.  Richardson Landry Minor ACNP [facility] PCCM Pager [phone] till 3 pm If no answer page [phone] 07/08/2015, 9:08 AM   US guidance Shock, hypotehrmia  Lavon Paganini. Titus Mould, MD, [place] Pgr: [facility]

## 2015-07-08 NOTE — Progress Notes (Signed)
Chaplain was paged to assist family of Pt with cardiac arrest. Chaplain prayed with family and assisted with comfort care. Chaplain spoke with family clergy by phone. Pt was taken to CT for scan. Chaplain will return to check on family.   07/08/15 0700  Clinical Encounter Type  Visited With Family  Visit Type Spiritual support  Referral From Nurse  Spiritual Encounters  Spiritual Needs Emotional;Prayer  Stress Factors  Patient Stress Factors Health changes  Family Stress Factors Major life changes

## 2015-07-08 NOTE — ED Notes (Signed)
Paged Dr. Nadyne Coombes

## 2015-07-08 NOTE — Progress Notes (Signed)
ANTICOAGULATION CONSULT NOTE - Initial Consult  Pharmacy Consult for Heparin and Unasyn Indication: ACS/STEMI + aspiration  Allergies  Allergen Reactions  . Hydrochlorothiazide     REACTION: precipitates gout  . Pseudoephedrine     REACTION: rash    Patient Measurements: Height: 5\' 8"  (172.7 cm) Weight: 175 lb (79.379 kg) IBW/kg (Calculated) : 68.4 Heparin Dosing Weight: 79.4 kg  Vital Signs: Temp: 98.2 F (36.8 C) (10/11 0815) Temp Source: Rectal (10/11 0651) BP: 114/71 mmHg (10/11 0847) Pulse Rate: 117 (10/11 0847)  Labs:  Recent Labs  07/08/15 0622 07/08/15 0628  HGB 10.9* 12.2*  HCT 32.8* 36.0*  PLT 131*  --   APTT 34  --   LABPROT 15.8*  --   INR 1.25  --   CREATININE 1.65* 1.60*  TROPONINI <0.03  --     Estimated Creatinine Clearance: 44.5 mL/min (by C-G formula based on Cr of 1.6).   Medical History: Past Medical History  Diagnosis Date  . Hypertension   . Alcohol abuse   . Gout   . Hyperlipemia     Medications:  Prescriptions prior to admission  Medication Sig Dispense Refill Last Dose  . AZOR 10-40 MG tablet Take 1 tablet by mouth daily.  1 07/07/2015 at Unknown time    Assessment: 64 y/o M found by family in respiratory distress. Shocked for Vfib>>ROSC (down approx 10 min), sinus tach. CT of head and check negative for neurologic injury and PE. Tracheal tear from traumatic intubation. Noted h/o DVT this past year. Patient initiated on hypothermia protocol with plans to start Unasyn to cover tracheal tear contamination from oral secretions and heparin for ischemia.  Anticoagulation: h/o DVT within the last year. Compliance with anticoagulation per wife? Baseline INR elevated 1.25,Start IV heparin cover for cardiac ischemia. Hgb 12.2. Plts 131 (watch)  ID: Tracheal tear from traumatic intubation. Cover for contamination from oral secretions. LA 6.4  Cards: h/o HTN, HLD  Neuro: h/o ETOH abuse. Currently sedated with hypothermia  protocol. Nimbex 14mcg/kg/min, Propofol at 70mcg/kg/min  GI/Nutrition: Noted baseline INR elevated 1.25. MV/thiamine/FA with EtOH abuse. Check baseline TG on propofol (also with h/o HLD)  Renal: Scr 1.6. Lactic acidosis.  Respir: VDRF  Goal of Therapy:  Heparin level 0.3-0.7 units/ml Monitor platelets by anticoagulation protocol: Yes   Plan:  Check baseline triglycerides now. Then q72h Unasyn 3g IV q8hr Heparin bolus 2500 units, Heparin infusion at 800 units/hr Check heparin level in 8 hrs and daily.   Marwa Fuhrman S. Alford Highland, PharmD, BCPS Clinical Staff Pharmacist Pager [phone]  Eilene Ghazi Stillinger 07/08/2015,9:31 AM

## 2015-07-08 NOTE — Consult Note (Addendum)
CARDIOLOGY CONSULT NOTE  Patient ID: Mitchell [name] MRN: [id number] DOB/AGE: 10-13-1950 64 y.o.  Admit date: 07/08/2015 Referring Physician: Critical Care Primary Physician:  No primary care provider on file. Reason for Consultation: Cardiac arrest  HPI: Mitchell [name]  is a 64 y.o. male with known HTN, Gout, Hyperlipidemia, ETOH abuse, and spontaneous DVT, on Xarelto. He was last seen on an outpatient basis in 2012 for palpitations and decreased exercise tolerance with normal echo and nuclear stress test.  Per ED and attending notes, he was noted to have respiratory distress early in the morning of 07/08/15 and EMS was activated. He was down for approximately 10-20 minutes, when EMS he was in V fib., defibrillated x 1 and epi x 1 with return of circulation. Intubated and transported to [facility] ED.  Wife states he had gotten up to go to the bathroom this morning and when he got back in the bed, he started making snorting noises and then became unresponsive. She states he is very sedentary with declining health status and decreased appetite over the past several months. Over the past 2 weeks, she states he has stayed primarily in the bed and has only left the house on 1 or 2 occasions. She states he denied any specific complaints other than not feeling well.   Past Medical History  Diagnosis Date  . Hypertension   . Alcohol abuse   . Gout   . Hyperlipemia      History reviewed. No pertinent past surgical history.   No family history on file.   Social History: Social History   Social History  . Marital Status: Married    Spouse Name: N/A  . Number of Children: N/A  . Years of Education: N/A   Occupational History  . Not on file.   Social History Main Topics  . Smoking status: Not on file  . Smokeless tobacco: Not on file  . Alcohol Use: Not on file  . Drug Use: Not on file  . Sexual Activity: Not on file   Other Topics Concern  . Not on file   Social History Narrative      Prescriptions prior to admission  Medication Sig Dispense Refill Last Dose  . AZOR 10-40 MG tablet Take 1 tablet by mouth daily.  1 07/07/2015 at Unknown time     ROS: unable to obtain, pt intubated and sedated; see HoPI  Physical Exam: Blood pressure 123/97, pulse 84, temperature 90.3 F (32.4 C), temperature source Core (Comment), resp. rate 28, height 5\' 8"  (1.727 m), weight 80 kg (176 lb 5.9 oz), SpO2 100 %.   General appearance: sedated Neck: no adenopathy, no carotid bruit, no JVD, supple, symmetrical, trachea midline and thyroid not enlarged, symmetric, no tenderness/mass/nodules Lungs: clear to auscultation bilaterally Heart: regular rate and rhythm, S1, S2 normal, no murmur, click, rub or gallop Extremities: extremities normal, atraumatic, no cyanosis or edema and cool to touch d/t induced hypothermia Pulses: left DP absent, right DP feeble (likely d/t reduced peripheral circulation from induced hypothermia) Skin: Skin color, texture, turgor normal. No rashes or lesions Neurologic: unresponsive to any stimuli  Labs:   Lab Results  Component Value Date   WBC 8.7 07/08/2015   HGB 12.6* 07/08/2015   HCT 37.0* 07/08/2015   MCV 95.3 07/08/2015   PLT 131* 07/08/2015    Recent Labs Lab 07/08/15 0622  07/08/15 1217  NA 140  < > 141  K 3.7  < > 3.6  CL 105  < > 112*  CO2 19*  --   --   BUN 8  < > 6  CREATININE 1.65*  < > 1.00  CALCIUM 8.1*  --   --   PROT 6.2*  --   --   BILITOT 0.7  --   --   ALKPHOS 99  --   --   ALT 34  --   --   AST 229*  --   --   GLUCOSE 160*  < > 199*  < > = values in this interval not displayed.  Lipid Panel     Component Value Date/Time   TRIG 75 07/08/2015 1123    BNP (last 3 results) No results for input(s): BNP in the last 8760 hours.  ProBNP (last 3 results) No results for input(s): PROBNP in the last 8760 hours.  HEMOGLOBIN A1C No results found for: HGBA1C, MPG  Cardiac Panel (last 3 results)  Recent Labs   07/08/15 0622  TROPONINI <0.03    Lab Results  Component Value Date   TROPONINI <0.03 07/08/2015     TSH No results for input(s): TSH in the last 8760 hours.  EKG 07/08/2015: sinus tachycardia at a rate of 106bpm, normal axis, left atrial abnormality, diffuse T wave abnormality   Radiology: Ct Head Wo Contrast  07/08/2015   CLINICAL DATA:  Status post cardiac arrest.  EXAM: CT HEAD WITHOUT CONTRAST  TECHNIQUE: Contiguous axial images were obtained from the base of the skull through the vertex without intravenous contrast.  COMPARISON:  None.  FINDINGS: No mass effect or midline shift. No evidence of acute intracranial hemorrhage, or infarction. No abnormal extra-axial fluid collections. Gray-white matter differentiation is normal. Basal cisterns are preserved. There is mild brain parenchymal atrophy and chronic small vessel disease changes.  No depressed skull fractures. Visualized paranasal sinuses and mastoid air cells are not opacified.  IMPRESSION: No acute intracranial abnormality.  Mild brain parenchymal atrophy and chronic microvascular disease.   Electronically Signed   By: Fidela Salisbury M.D.   On: 07/08/2015 07:59   Ct Angio Chest Pe W/cm &/or Wo Cm  07/08/2015   CLINICAL DATA:  Hypoxia.  Status post cardiac arrest.  EXAM: CT ANGIOGRAPHY CHEST WITH CONTRAST  TECHNIQUE: Multidetector CT imaging of the chest was performed using the standard protocol during bolus administration of intravenous contrast. Multiplanar CT image reconstructions and MIPs were obtained to evaluate the vascular anatomy.  CONTRAST:  27mL OMNIPAQUE IOHEXOL 350 MG/ML SOLN  COMPARISON:  Chest radiograph July 08, 2015  FINDINGS: There is no demonstrable pulmonary embolus. There is no appreciable thoracic aortic aneurysm. There is patchy atherosclerotic disease in the aortic arch region. Visualized great vessels show mild atherosclerotic change.  There is extensive soft tissue air in the visualized lower neck  tracking into the left superior mediastinum. There is an endotracheal tube present. Air is seen tracking slightly superior to the endotracheal tube in the lower neck region at the level of the thyroid, raising concern for potential tracheal injury.  There is bibasilar atelectatic change. Areas of patchy ground-glass opacity bilaterally most likely represent early pulmonary edema. No pneumothorax.  Visualized thyroid appears unremarkable. No adenopathy is appreciable. There are scattered foci of coronary artery calcification. Pericardium is not thickened.  In the visualized upper abdomen, there are no appreciable lesions.  There are no blastic or lytic bone lesions. There is degenerative change in the thoracic spine.  Review of the MIP images confirms the above findings.  IMPRESSION: There is extensive air in the  neck and upper mediastinal region. Air is noted just anterior to the trachea in the lower neck region. Given the history and the presence of the endotracheal tube, question traumatic intubation with tracheal injury.  No demonstrable pulmonary embolus.  No pneumothorax.  Evidence of early pulmonary edema.  Bibasilar atelectatic change.  No adenopathy.  Critical Value/emergent results were called by telephone at the time of interpretation on 07/08/2015 at 8:01 am to Dr. Pryor Curia , who verbally acknowledged these results.   Electronically Signed   By: Lowella Grip III M.D.   On: 07/08/2015 08:01   Dg Chest Port 1 View  07/08/2015   CLINICAL DATA:  Central line placement.  Ventilator.  Post CPR  EXAM: PORTABLE CHEST 1 VIEW  COMPARISON:  07/08/2015  FINDINGS: Endotracheal tube 3 cm above the carina unchanged. Left jugular central venous catheter tip in the SVC in good position. No pneumothorax. NG tube in the stomach.  Improved aeration of the lungs. Lungs are now clear without infiltrate or atelectasis. No effusion.  IMPRESSION: Endotracheal tube in good position.  Central venous catheter in good  position.  Improved aeration.  Lungs are clear.   Electronically Signed   By: Franchot Gallo M.D.   On: 07/08/2015 09:26   Dg Chest Portable 1 View  07/08/2015   CLINICAL DATA:  Post code.  Assess endotracheal tube.  EXAM: PORTABLE CHEST 1 VIEW  COMPARISON:  None.  FINDINGS: Endotracheal tube with tip measuring 3 cm above the carinal. Additional catheter projected over the left side of the neck is of nonspecific etiology and may be external. Shallow inspiration. Normal heart size and pulmonary vascularity. No focal airspace disease or consolidation in the lungs. No pneumothorax. Visualized ribs appear intact. Degenerative changes in the spine and shoulders.  IMPRESSION: Endotracheal tube tip measures 3 cm above the carinal. No evidence of active pulmonary disease.   Electronically Signed   By: Lucienne Capers M.D.   On: 07/08/2015 06:57    Scheduled Meds: . ampicillin-sulbactam (UNASYN) IV  3 g Intravenous Q8H  . antiseptic oral rinse  7 mL Mouth Rinse QID  . artificial tears  1 application Both Eyes 3 times per day  . aspirin  300 mg Rectal Daily  . chlorhexidine gluconate  15 mL Mouth Rinse BID  . fentaNYL (SUBLIMAZE) injection  50 mcg Intravenous Once  . folic acid  1 mg Intravenous Daily  . lidocaine (cardiac) 100 mg/77ml      . midazolam  1 mg Intravenous Once  . sodium chloride  10-40 mL Intracatheter Q12H  . thiamine IV  100 mg Intravenous Daily   Continuous Infusions: . cisatracurium (NIMBEX) infusion 1 mcg/kg/min (07/08/15 0930)  . fentaNYL infusion INTRAVENOUS 100 mcg/hr (07/08/15 0930)  . heparin 800 Units/hr (07/08/15 1025)  . midazolam (VERSED) infusion 4 mg/hr (07/08/15 0930)  . norepinephrine (LEVOPHED) Adult infusion     PRN Meds:.[COMPLETED] cisatracurium **AND** cisatracurium (NIMBEX) infusion **AND** cisatracurium, fentaNYL, midazolam, midazolam, sodium chloride  ASSESSMENT AND PLAN:  1. Post V.fib Cardiac Arrest 2. Benign essential HTN 3. Hyperlipidemia 4. 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 Initial troponin negative, will repeat.  Consider echocardiogram to evaluate LVEF.  EEG has been performed, pending.  Will defer further testing until results available.   Rachel Bo, NP-C 07/08/2015, 12:25 PM [facility] Pager: [phone] Office: [phone] If no answer Cell [phone]   Cardiac Panel (last 3 results)  Recent Labs  07/08/15 0622 07/08/15 1250  TROPONINI <0.03 0.37*   Patient seen and examined, extremely difficult to state etiology for this sudden onset of cardiac arrest, patient previously has not complained of any chest pain or shortness of breath, I had seen him about 4 years ago. EKG reveals early  transitional R waves with a borderline criteria for LVH, cannot exclude posterior infarct old. Nonspecific inferior ST changes are evident, baseline artifact present.  Would recommend obtaining echocardiogram depending on his neurological comes. Presently on hypothermia protocol, will be difficult to obtain echo. I will continue to follow the patient with you. Does not appear to be ACS, serum troponin elevation is very miniscule compared to his presentation and may suggest global hypoxemia and secondary troponin leak.  I have personally reviewed the patient's record and performed physical exam and agree with the assessment and plan of Ms. Neldon Labella, NP-C.  Adrian Prows, MD 07/08/2015, 4:39 PM [facility] [place] Pager: [phone] Office: [phone] If no answer: Cell:  [phone]

## 2015-07-08 NOTE — Progress Notes (Signed)
EEG Completed; Results Pending  

## 2015-07-08 NOTE — ED Notes (Signed)
Paged Dr. Smith.

## 2015-07-08 NOTE — Progress Notes (Signed)
   07/08/15 1000  Clinical Encounter Type  Visited With Family  Visit Type Initial;Spiritual support  Referral From Nurse  Spiritual Encounters  Spiritual Needs Emotional  Stress Factors  Family Stress Factors Major life changes  Chaplain visited with family; Chaplain facilitated family to see Pt;  Chaplain was notified that the Pt came in through the ED; Chaplain will stay for a little while and return if wife Mrs. Deskins need further support

## 2015-07-08 NOTE — Procedures (Signed)
History: 64 yo M s/p cardiac arrest.   Sedation: versed  Technique: This is a 19 channel routine scalp EEG performed at the bedside with bipolar and monopolar montages arranged in accordance to the international 10/20 system of electrode placement. One channel was dedicated to EKG recording.    Background: The background is markedly attenuated. When reviewed at a sensitivity of 3 V/mm there is visible some low amplitude beta activity seen bilaterally. There is no posterior dominant rhythm.  Photic stimulation: Physiologic driving is not performed  EEG Abnormalities: 1) markedly attenuated background  Clinical Interpretation: This EEG is markedly abnormal due to severe attenuation of background rhythms. This can be seen with over sedation, diffuse cortical injury, hypothermia. There was no seizure or seizure predisposition recorded on this study.   Roland Rack, MD [facility] [phone]  If 7pm- 7am, please page neurology on call as listed in [place].

## 2015-07-08 NOTE — Procedures (Signed)
Arterial Catheter Insertion Procedure Note Mitchell [name] [id number] [birth year]  Procedure: Insertion of Arterial Catheter  Indications: Blood pressure monitoring and Frequent blood sampling  Procedure Details Consent: Risks of procedure as well as the alternatives and risks of each were explained to the (patient/caregiver).  Consent for procedure obtained. Time Out: Verified patient identification, verified procedure, site/side was marked, verified correct patient position, special equipment/implants available, medications/allergies/relevent history reviewed, required imaging and test results available.  Performed  Maximum sterile technique was used including antiseptics, cap, gloves, gown, hand hygiene, mask and sheet. Skin prep: Chlorhexidine; local anesthetic administered 20 gauge catheter was inserted into left femoral artery using the Seldinger technique.  Evaluation Blood flow good; BP tracing good. Complications: No apparent complications  Used left femoral in case he needs cardiac cath via right femoral artery.   Richardson Landry Minor ACNP [facility] PCCM Pager [phone] till 3 pm If no answer page [phone] 07/08/2015, 9:09 AM   Tolerated I consented [place] gudiance  Daniel J. Titus Mould, MD, [place] Pgr: [facility]

## 2015-07-08 NOTE — Consult Note (Signed)
Mitchell [name], Mitchell [name] [id number] [birth year] No att. providers found  Reason for Consult: neck emphysema after intubation, possible tracheal inhury  HPI: 64yo found down after likely MI, intubated with [facility] supraglottic airway in field then converted to ETT in the ER with glide scope. Had chest CT that I reviewed that showed some subcutaneous/mediastinal emphysema/air anterior to and lateral to the trachea suspicious for a possible tracheal injury. ENT consulted for the above  Allergies:  Allergies  Allergen Reactions  . Hydrochlorothiazide     REACTION: precipitates gout  . Pseudoephedrine     REACTION: rash    ROS: unable to obtain ROS due to intubation  PMH:  Past Medical History  Diagnosis Date  . Hypertension   . Alcohol abuse   . Gout   . Hyperlipemia     FH: History reviewed. No pertinent family history.  SH:  Social History   Social History  . Marital Status: Married    Spouse Name: N/A  . Number of Children: N/A  . Years of Education: N/A   Occupational History  . Not on file.   Social History Main Topics  . Smoking status: Current Some Day Smoker    Types: Cigars  . Smokeless tobacco: Not on file  . Alcohol Use: Not on file  . Drug Use: Not on file  . Sexual Activity: Not on file   Other Topics Concern  . Not on file   Social History Narrative    PSH: History reviewed. No pertinent past surgical history.  Physical  Exam: sedated with ETT in place orally and secure. Sanguinous secretions from oral suction. Neck with some crepitus subcutaneously but trachea grossly midline. No palpable lymphadenopathy.   Procedure Note: [id number]  Verbal timeout was performed prior to the procedure. The 18mm flexible scope was advanced through the left nasal cavity. The septum and turbinates appeared normal. The middle meatus was free of polyps or purulence. The eustachian tube, choana, and adenoids were normal in appearance. Even with copious oral and nasal suctioning the pharynx  and laryngeal area are filled with pooled sanguinous secretions and the larynx could not be visualized. The patient tolerated the procedure with no immediate complications.  A/P: possible tracheal/laryngeal/esophageal/pharyngeal injury from emergent intubation. Would monitor on antibiotics for now. Once extubated would check a barium swallow, possibly also a CT neck to evaluate the esophagus/larynx, and will need a speech pathology swallow eval after extubation. If no aspiration and if no leak or only a small contained leak on the barium swallow/CT neck could likely monitor with outpatient followup, as a small injury will likely heal with time.   Ruby Cola 07/08/2015 5:40 PM

## 2015-07-08 NOTE — ED Notes (Addendum)
Pt. arrived with EMS from home , found by spouse in respiratory distress, EMS initiated CPR at home received Epinephrine 1 mg and shocked x1 for Vfib - converted to sinus tachycardia / ROSC, CBG=149 by EMS  , intubated at scene [facility] airway) .

## 2015-07-08 NOTE — ED Notes (Signed)
Mitchell [name] with critical care at bedside

## 2015-07-08 NOTE — ED Notes (Signed)
Pt transported to CT and returned to room with this RN and RT. Pt tolerated well. Remained on monitor.
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
## 2015-07-08 NOTE — H&P (Signed)
PULMONARY / CRITICAL CARE MEDICINE   Name: Mitchell [name] MRN: [id number] DOB: April 12, 1951    ADMISSION DATE:  07/08/2015   REFERRING MD :  EDP  CHIEF COMPLAINT:  Arrest  INITIAL PRESENTATION: Post arrest  STUDIES:  10/11 ct head >>atrophy mild, neg acute 10/11 ct angio chest>>There is extensive air in the neck and upper mediastinal region. Air is noted just anterior to the trachea in the lower neck region  SIGNIFICANT EVENTS: 10/11 cardiac arrest  HISTORY OF PRESENT ILLNESS:    64 yo AAM with incomplete pmh but known HTN, Gout, Hyperlipidemia, ETOH abuse who was noted to have respiratory distress and odd resp sounds per wife 1 min after laying back down  early am 10/11 and EMS was activated. Wife attempted cpr at home. He was down for approximately 10 minutes AND  when EMS he was in V fib. Shocked x 1 and epi x1 with return of circulation. Intubated and transported to [facility] ED. He was taken for CT of chest and head to rule out PE and or Neurological injury.   PAST MEDICAL HISTORY :   has a past medical history of Hypertension; Alcohol abuse; Gout; and Hyperlipemia.  has no past surgical history on file. dvt this past year Prior to Admission medications he takes a blood thinner  Not on File   Allergies  Allergen Reactions  . Hydrochlorothiazide     REACTION: precipitates gout  . Pseudoephedrine     REACTION: rash    FAMILY HISTORY:  has no family status information on file.  SOCIAL HISTORY:  truck driver, smoker., retired  REVIEW OF SYSTEMS:  Unable , code vented  SUBJECTIVE: [id number]  VITAL SIGNS: Temp:  [96.3 F (35.7 C)-98.8 F (37.1 C)] 97.9 F (36.6 C) (10/11 0754) Pulse Rate:  [104-135] 118 (10/11 0754) Resp:  [23-38] 38 (10/11 0754) BP: (111-125)/(82-98) 120/98 mmHg (10/11 0700) SpO2:  [98 %-100 %] 100 % (10/11 0754) FiO2 (%):  [50 %-100 %] 50 % (10/11 0754) Weight:  [175 lb (79.379 kg)] 175 lb (79.379 kg) (10/11 0651) HEMODYNAMICS:   VENTILATOR  SETTINGS: Vent Mode:  [-] PRVC FiO2 (%):  [50 %-100 %] 50 % Set Rate:  [10 bmp-28 bmp] 28 bmp Vt Set:  [480 mL-550 mL] 550 mL PEEP:  [5 cmH20] 5 cmH20 INTAKE / OUTPUT:  Intake/Output Summary (Last 24 hours) at 07/08/15 [id number] Last data filed at 07/08/15 0655  Gross per 24 hour  Intake      0 ml  Output     20 ml  Net    -20 ml    PHYSICAL EXAMINATION: General:  Disheveled AAM, sedated and with nmb ON BOARD  Neuro: NMB, perr, no movement any ext , no seizures noted HEENT: Disconjugate gaze, PERL 3 mm, crepitus? Mild  Cardiovascular:  HSR RR Lungs:  MIld rhonchi Abdomen: SOft , decreased b s Musculoskeletal:  Bilateral wrist contracture Skin: cold  LABS:  CBC  Recent Labs Lab 07/08/15 0622 07/08/15 0628  WBC 8.7  --   HGB 10.9* 12.2*  HCT 32.8* 36.0*  PLT 131*  --    Coag's  Recent Labs Lab 07/08/15 0622  APTT 34  INR 1.25   BMET  Recent Labs Lab 07/08/15 0622 07/08/15 0628  NA 140 141  K 3.7 3.7  CL 105 107  CO2 19*  --   BUN 8 8  CREATININE 1.65* 1.60*  GLUCOSE 160* 163*   Electrolytes  Recent Labs Lab 07/08/15 0622  CALCIUM 8.1*  MG 1.0*   Sepsis Markers  Recent Labs Lab 07/08/15 0628  LATICACIDVEN 6.40*   ABG  Recent Labs Lab 07/08/15 0711  PHART 7.247*  PCO2ART 44.7  PO2ART 347.0*   Liver Enzymes  Recent Labs Lab 07/08/15 0622  AST 229*  ALT 34  ALKPHOS 99  BILITOT 0.7  ALBUMIN 2.6*   Cardiac Enzymes  Recent Labs Lab 07/08/15 0622  TROPONINI <0.03   Glucose  Recent Labs Lab 07/08/15 0616  GLUCAP 158*    Imaging Ct Head Wo Contrast  07/08/2015   CLINICAL DATA:  Status post cardiac arrest.  EXAM: CT HEAD WITHOUT CONTRAST  TECHNIQUE: Contiguous axial images were obtained from the base of the skull through the vertex without intravenous contrast.  COMPARISON:  None.  FINDINGS: No mass effect or midline shift. No evidence of acute intracranial hemorrhage, or infarction. No abnormal extra-axial fluid  collections. Gray-white matter differentiation is normal. Basal cisterns are preserved. There is mild brain parenchymal atrophy and chronic small vessel disease changes.  No depressed skull fractures. Visualized paranasal sinuses and mastoid air cells are not opacified.  IMPRESSION: No acute intracranial abnormality.  Mild brain parenchymal atrophy and chronic microvascular disease.   Electronically Signed   By: Fidela Salisbury M.D.   On: 07/08/2015 07:59   Ct Angio Chest Pe W/cm &/or Wo Cm  07/08/2015   CLINICAL DATA:  Hypoxia.  Status post cardiac arrest.  EXAM: CT ANGIOGRAPHY CHEST WITH CONTRAST  TECHNIQUE: Multidetector CT imaging of the chest was performed using the standard protocol during bolus administration of intravenous contrast. Multiplanar CT image reconstructions and MIPs were obtained to evaluate the vascular anatomy.  CONTRAST:  52mL OMNIPAQUE IOHEXOL 350 MG/ML SOLN  COMPARISON:  Chest radiograph July 08, 2015  FINDINGS: There is no demonstrable pulmonary embolus. There is no appreciable thoracic aortic aneurysm. There is patchy atherosclerotic disease in the aortic arch region. Visualized great vessels show mild atherosclerotic change.  There is extensive soft tissue air in the visualized lower neck tracking into the left superior mediastinum. There is an endotracheal tube present. Air is seen tracking slightly superior to the endotracheal tube in the lower neck region at the level of the thyroid, raising concern for potential tracheal injury.  There is bibasilar atelectatic change. Areas of patchy ground-glass opacity bilaterally most likely represent early pulmonary edema. No pneumothorax.  Visualized thyroid appears unremarkable. No adenopathy is appreciable. There are scattered foci of coronary artery calcification. Pericardium is not thickened.  In the visualized upper abdomen, there are no appreciable lesions.  There are no blastic or lytic bone lesions. There is degenerative change  in the thoracic spine.  Review of the MIP images confirms the above findings.  IMPRESSION: There is extensive air in the neck and upper mediastinal region. Air is noted just anterior to the trachea in the lower neck region. Given the history and the presence of the endotracheal tube, question traumatic intubation with tracheal injury.  No demonstrable pulmonary embolus.  No pneumothorax.  Evidence of early pulmonary edema.  Bibasilar atelectatic change.  No adenopathy.  Critical Value/emergent results were called by telephone at the time of interpretation on 07/08/2015 at 8:01 am to Dr. Pryor Curia , who verbally acknowledged these results.   Electronically Signed   By: Lowella Grip III M.D.   On: 07/08/2015 08:01   Dg Chest Portable 1 View  07/08/2015   CLINICAL DATA:  Post code.  Assess endotracheal tube.  EXAM: PORTABLE CHEST 1 VIEW  COMPARISON:  None.  FINDINGS: Endotracheal tube with tip measuring 3 cm above the carinal. Additional catheter projected over the left side of the neck is of nonspecific etiology and may be external. Shallow inspiration. Normal heart size and pulmonary vascularity. No focal airspace disease or consolidation in the lungs. No pneumothorax. Visualized ribs appear intact. Degenerative changes in the spine and shoulders.  IMPRESSION: Endotracheal tube tip measures 3 cm above the carinal. No evidence of active pulmonary disease.   Electronically Signed   By: Lucienne Capers M.D.   On: 07/08/2015 06:57     ASSESSMENT / PLAN:  PULMONARY OETT 10/11>> A: Respiratory distress Post arrest Tracheal tear (ill assume traumatic intubation) P:   Vent bundle PRN BD ENT consult Unasyn for tracheal tear contamination from oral secretions  CARDIOVASCULAR CVL ij 10/11>>> A:  Post V fib arrest R/o ischemia H/o DVT  P:  Hypothermia protocol Cards consult possible cardiac cath in future Heparin drip  RENAL A:  Creatine 1.64 Lactic acidosis P:   Follow  creatine Follow lactic acid Avoid diuretics   GASTROINTESTINAL A:  GI protection P:   PPI  HEMATOLOGIC A:   Thrombocytopenia P:  Monitor  INFECTIOUS A:   Tracheal tear P:   10/11 unasyn>>  ENDOCRINE A:   At risk hyperglcyemia with hypothermia P:   ssi  NEUROLOGIC A: At risk severe anoxic brain injury Sedated and with NMB ETOH use, at risk WD P:   RASS goal: 0 Sedate per protocol Thiamine/folic acid for etoh history   FAMILY  - Updates:   - Inter-disciplinary family meet or Palliative Care meeting due by:  day 7    TODAY'S SUMMARY:   64 yo AAM with incomplete pmh but known HTN, Gout, Hyperlipidemia, ETOH abuse who was noted to have respiratory distress early am 10/11 and EMS was activated. He was down for approximately 10-20 minutes AND  when EMS he was in V fib. Shocked x 1 and epi x1 with return of circulation. Intubated and transported to [facility] ED. He was taken for CT of chest and head to rule out PE and or Neurological injury.   Richardson Landry Minor ACNP Maryanna Shape PCCM Pager [phone] till 3 pm If no answer page [phone] 07/08/2015, 8:07 AM   STAFF NOTE: I, Merrie Roof, MD FACP have personally reviewed patient's available data, including medical history, events of note, physical examination and test results as part of my evaluation. I have discussed with resident/NP and other care providers such as pharmacist, RN and RRT. In addition, I personally evaluated patient and elicited key findings of:  Post arrest, per, neck crep?, mild abdo distention, MAP 100 but dropping, I interviewed wife extensive, dvt is months to 1 year and compliance with anticoagulation an issue in past, assess CT angio stat, My concern is cardiac ischemia as source VF arrest , has risk factors, witnessed arrest, down time likely 10 min , VF, cool to 32-34, hypothermia protocol, CT head neg bleed, tear noted tracheal , assume traumatic intubation, too sick to neck CT, add empiric abx, ENT  assessment, at some stage would need bronch examination, ett in place now however, ABG abg noted, increase MV, cardiology on board, LA 6.5 some concern, consider early cath, MAP goal 80-85 now, change to versed / fent off prop with concern etoh ed component, give thiamine, folic acid, MVI, no WUA, may need redcution MV on vent once at goal, repeat abg, place cvl , get cvp, aline needed The patient is critically ill with multiple  organ systems failure and requires high complexity decision making for assessment and support, frequent evaluation and titration of therapies, application of advanced monitoring technologies and extensive interpretation of multiple databases.   Critical Care Time devoted to patient care services described in this note is45 Minutes. This time reflects time of care of this signee: Merrie Roof, MD FACP. This critical care time does not reflect procedure time, or teaching time or supervisory time of PA/NP/Med student/Med Resident etc but could involve care discussion time. Rest per NP/medical resident whose note is outlined above and that I agree with   Lavon Paganini. Titus Mould, MD, [place] Pgr: [facility] 07/08/2015 9:19 AM

## 2015-07-08 NOTE — Progress Notes (Signed)
[place] for Heparin  Indication: ACS/STEMI   Allergies  Allergen Reactions  . Hydrochlorothiazide     REACTION: precipitates gout  . Pseudoephedrine     REACTION: rash    Patient Measurements: Height: 5\' 8"  (172.7 cm) Weight: 176 lb 5.9 oz (80 kg) IBW/kg (Calculated) : 68.4 Heparin Dosing Weight: 79.4 kg  Vital Signs: Temp: 91 F (32.8 C) (10/11 1800) Temp Source: Core (Comment) (10/11 1800) BP: 101/74 mmHg (10/11 1544) Pulse Rate: 56 (10/11 1800)  Labs:  Recent Labs  07/08/15 0622  07/08/15 1217 07/08/15 1250 07/08/15 1420 07/08/15 1423 07/08/15 1606 07/08/15 1820  HGB 10.9*  < > 12.6*  --   --  12.2* 11.9*  --   HCT 32.8*  < > 37.0*  --   --  36.0* 35.0*  --   PLT 131*  --   --   --   --   --   --   --   APTT 34  --   --   --   --   --   --   --   LABPROT 15.8*  --   --   --  16.5*  --   --   --   INR 1.25  --   --   --  1.32  --   --   --   HEPARINUNFRC  --   --   --   --   --   --   --  0.56  CREATININE 1.65*  < > 1.00  --   --  1.00 1.00  --   TROPONINI <0.03  --   --  0.37*  --   --   --   --   < > = values in this interval not displayed.  Estimated Creatinine Clearance: 71.3 mL/min (by C-G formula based on Cr of 1).   Medical History: Past Medical History  Diagnosis Date  . Hypertension   . Alcohol abuse   . Gout   . Hyperlipemia     Medications:  Prescriptions prior to admission  Medication Sig Dispense Refill Last Dose  . AZOR 10-40 MG tablet Take 1 tablet by mouth daily.  1 07/07/2015 at Unknown time    Assessment: 64 y/o M found by family in respiratory distress. Shocked for Vfib>>ROSC (down approx 10 min), sinus tach. CT of head and check negative for neurologic injury and PE. Tracheal tear from traumatic intubation. Noted h/o DVT this past year. Patient initiated on hypothermia protocol with plans to start Unasyn to cover tracheal tear contamination from oral secretions and heparin for  ischemia.  H/o DVT within the last year. Compliance with anticoagulation per wife? Baseline INR elevated 1.25,Start IV heparin cover for cardiac ischemia. Hgb 12.2. Plts 131.  Initial HL is therapeutic at 0.56 on heparin 800 units/hr. No issues with infusion or bleeding noted.   Goal of Therapy:  Heparin level 0.3-0.7 units/ml Monitor platelets by anticoagulation protocol: Yes   Plan:  Continue heparin 800 units/hr Daily HL/CBC Monitor s/sx of bleeding   Andrey Cota. Diona Foley, PharmD Clinical Pharmacist Pager [phone] 07/08/2015,7:33 PM

## 2015-07-08 NOTE — Progress Notes (Signed)
K 3.1 reported to eMD Mann[place]

## 2015-07-09 DIAGNOSIS — N179 Acute kidney failure, unspecified: Secondary | ICD-10-CM

## 2015-07-09 DIAGNOSIS — I469 Cardiac arrest, cause unspecified: Secondary | ICD-10-CM | POA: Diagnosis present

## 2015-07-09 DIAGNOSIS — Q321 Other congenital malformations of trachea: Secondary | ICD-10-CM

## 2015-07-09 DIAGNOSIS — I4901 Ventricular fibrillation: Secondary | ICD-10-CM

## 2015-07-09 LAB — BASIC METABOLIC PANEL
ANION GAP: 14 (ref 5–15)
Anion gap: 10 (ref 5–15)
Anion gap: 10 (ref 5–15)
Anion gap: 11 (ref 5–15)
Anion gap: 14 (ref 5–15)
Anion gap: 9 (ref 5–15)
BUN: 5 mg/dL — AB (ref 6–20)
BUN: 7 mg/dL (ref 6–20)
BUN: 7 mg/dL (ref 6–20)
BUN: 8 mg/dL (ref 6–20)
BUN: 8 mg/dL (ref 6–20)
BUN: 9 mg/dL (ref 6–20)
CALCIUM: 7.4 mg/dL — AB (ref 8.9–10.3)
CHLORIDE: 109 mmol/L (ref 101–111)
CHLORIDE: 111 mmol/L (ref 101–111)
CHLORIDE: 112 mmol/L — AB (ref 101–111)
CHLORIDE: 113 mmol/L — AB (ref 101–111)
CHLORIDE: 114 mmol/L — AB (ref 101–111)
CO2: 17 mmol/L — AB (ref 22–32)
CO2: 17 mmol/L — AB (ref 22–32)
CO2: 17 mmol/L — AB (ref 22–32)
CO2: 17 mmol/L — ABNORMAL LOW (ref 22–32)
CO2: 18 mmol/L — ABNORMAL LOW (ref 22–32)
CO2: 19 mmol/L — AB (ref 22–32)
CREATININE: 1.17 mg/dL (ref 0.61–1.24)
CREATININE: 1.25 mg/dL — AB (ref 0.61–1.24)
CREATININE: 1.37 mg/dL — AB (ref 0.61–1.24)
CREATININE: 1.6 mg/dL — AB (ref 0.61–1.24)
Calcium: 7 mg/dL — ABNORMAL LOW (ref 8.9–10.3)
Calcium: 7 mg/dL — ABNORMAL LOW (ref 8.9–10.3)
Calcium: 7.1 mg/dL — ABNORMAL LOW (ref 8.9–10.3)
Calcium: 7.2 mg/dL — ABNORMAL LOW (ref 8.9–10.3)
Calcium: 7.3 mg/dL — ABNORMAL LOW (ref 8.9–10.3)
Chloride: 110 mmol/L (ref 101–111)
Creatinine, Ser: 1.2 mg/dL (ref 0.61–1.24)
Creatinine, Ser: 1.22 mg/dL (ref 0.61–1.24)
GFR calc Af Amer: >60 mL/min (ref >60)
GFR calc Af Amer: >60 mL/min (ref >60)
GFR calc Af Amer: >60 mL/min (ref >60)
GFR calc non Af Amer: 44 mL/min — ABNORMAL LOW (ref >60)
GFR calc non Af Amer: 53 mL/min — ABNORMAL LOW (ref >60)
GFR calc non Af Amer: 59 mL/min — ABNORMAL LOW (ref >60)
GFR calc non Af Amer: >60 mL/min (ref >60)
GFR calc non Af Amer: >60 mL/min (ref >60)
GFR, EST AFRICAN AMERICAN: 51 mL/min — AB (ref >60)
GLUCOSE: 108 mg/dL — AB (ref 65–99)
GLUCOSE: 89 mg/dL (ref 65–99)
Glucose, Bld: 97 mg/dL (ref 65–99)
Glucose, Bld: 91 mg/dL (ref 65–99)
Glucose, Bld: 85 mg/dL (ref 65–99)
Glucose, Bld: 85 mg/dL (ref 65–99)
POTASSIUM: 3.6 mmol/L (ref 3.5–5.1)
POTASSIUM: 3.8 mmol/L (ref 3.5–5.1)
POTASSIUM: 3.4 mmol/L — AB (ref 3.5–5.1)
Potassium: 3.3 mmol/L — ABNORMAL LOW (ref 3.5–5.1)
Potassium: 3.5 mmol/L (ref 3.5–5.1)
Potassium: 3.5 mmol/L (ref 3.5–5.1)
SODIUM: 142 mmol/L (ref 135–145)
SODIUM: 142 mmol/L (ref 135–145)
Sodium: 138 mmol/L (ref 135–145)
Sodium: 139 mmol/L (ref 135–145)
Sodium: 139 mmol/L (ref 135–145)
Sodium: 142 mmol/L (ref 135–145)

## 2015-07-09 LAB — GLUCOSE, CAPILLARY
GLUCOSE-CAPILLARY: 102 mg/dL — AB (ref 65–99)
GLUCOSE-CAPILLARY: 83 mg/dL (ref 65–99)
GLUCOSE-CAPILLARY: 89 mg/dL (ref 65–99)
GLUCOSE-CAPILLARY: 90 mg/dL (ref 65–99)
GLUCOSE-CAPILLARY: 98 mg/dL (ref 65–99)
Glucose-Capillary: 104 mg/dL — ABNORMAL HIGH (ref 65–99)
Glucose-Capillary: 97 mg/dL (ref 65–99)
Glucose-Capillary: 77 mg/dL (ref 65–99)
Glucose-Capillary: 83 mg/dL (ref 65–99)
Glucose-Capillary: 72 mg/dL (ref 65–99)

## 2015-07-09 LAB — CBC
HCT: 32 % — ABNORMAL LOW (ref 39.0–52.0)
HEMOGLOBIN: 11.1 g/dL — AB (ref 13.0–17.0)
MCH: 31.3 pg (ref 26.0–34.0)
MCHC: 34.7 g/dL (ref 30.0–36.0)
MCV: 90.1 fL (ref 78.0–100.0)
PLATELETS: 102 10*3/uL — AB (ref 150–400)
RBC: 3.55 MIL/uL — AB (ref 4.22–5.81)
RDW: 13.7 % (ref 11.5–15.5)
WBC: 10.7 10*3/uL — ABNORMAL HIGH (ref 4.0–10.5)

## 2015-07-09 LAB — HEPARIN LEVEL (UNFRACTIONATED): HEPARIN UNFRACTIONATED: 0.5 [IU]/mL (ref 0.30–0.70)

## 2015-07-09 LAB — LACTIC ACID, PLASMA
Lactic Acid, Venous: 1.4 mmol/L (ref 0.5–2.0)
Lactic Acid, Venous: 1.3 mmol/L (ref 0.5–2.0)

## 2015-07-09 LAB — MAGNESIUM
MAGNESIUM: 1.8 mg/dL (ref 1.7–2.4)
Magnesium: 0.9 mg/dL — CL (ref 1.7–2.4)

## 2015-07-09 LAB — TROPONIN I: Troponin I: 0.25 ng/mL — ABNORMAL HIGH (ref <0.031)

## 2015-07-09 MED ORDER — DEXTROSE 50 % IV SOLN
INTRAVENOUS | Status: AC
  Filled 2015-07-09: qty 50

## 2015-07-09 MED ORDER — NOREPINEPHRINE BITARTRATE 1 MG/ML IV SOLN
0.0000 ug/min | INTRAVENOUS | Status: DC
  Administered 2015-07-09: 8 ug/min via INTRAVENOUS
  Administered 2015-07-11: 1 ug/min via INTRAVENOUS
  Filled 2015-07-09 (×2): qty 16

## 2015-07-09 MED ORDER — ASPIRIN 325 MG PO TABS
325.0000 mg | ORAL_TABLET | Freq: Every day | ORAL | Status: DC
  Administered 2015-07-09 – 2015-07-10 (×2): 325 mg
  Filled 2015-07-09 (×3): qty 1

## 2015-07-09 MED ORDER — MAGNESIUM SULFATE 4 GM/100ML IV SOLN
4.0000 g | Freq: Once | INTRAVENOUS | Status: AC
  Administered 2015-07-09: 4 g via INTRAVENOUS
  Filled 2015-07-09: qty 100

## 2015-07-09 NOTE — Progress Notes (Signed)
PULMONARY / CRITICAL CARE MEDICINE   Name: Mitchell [name] MRN: [id number] DOB: 1951-04-16    ADMISSION DATE:  07/08/2015   REFERRING MD :  EDP  CHIEF COMPLAINT:  S/P Arrest  INITIAL PRESENTATION: Post arrest  STUDIES:  10/11 ct head - atrophy mild, neg acute 10/11 ct angio chest - There is extensive air in the neck and upper mediastinal region. Air is noted just anterior to the trachea in the lower neck region 10/11 EEG - attenuated background rhythm. No seizure activity.  SIGNIFICANT EVENTS: 10/11 - Admitted post cardiac arrest & cooling initiated 10/12 - Rewarming to occur  SUBJECTIVE: No acute events overnight. Patient has required some low-dose vasopressor infusion this morning.  REVIEW OF SYSTEMS:  Unable to obtain as patient is intubated, sedated, & paralyzed.  VITAL SIGNS: Temp:  [88.9 F (31.6 C)-93.2 F (34 C)] 91.6 F (33.1 C) (10/12 1100) Pulse Rate:  [54-89] 71 (10/12 1100) Resp:  [25-31] 28 (10/12 1100) BP: (101-130)/(69-90) 122/69 mmHg (10/12 0756) SpO2:  [100 %] 100 % (10/12 1100) FiO2 (%):  [40 %-50 %] 40 % (10/12 0800) HEMODYNAMICS: CVP:  [4 mmHg-7 mmHg] 5 mmHg VENTILATOR SETTINGS: Vent Mode:  [-] PRVC FiO2 (%):  [40 %-50 %] 40 % Set Rate:  [28 bmp] 28 bmp Vt Set:  [550 mL] 550 mL PEEP:  [5 cmH20] 5 cmH20 Plateau Pressure:  [15 cmH20-18 cmH20] 16 cmH20 INTAKE / OUTPUT:  Intake/Output Summary (Last 24 hours) at 07/09/15 1106 Last data filed at 07/09/15 1034  Gross per 24 hour  Intake 1047.94 ml  Output   1895 ml  Net -847.06 ml    PHYSICAL EXAMINATION: General:  Sedated & paralyzed. No acute distress. Family at bedside.  Integument:  Cool & dry. No rash on exposed skin. No bruising. HEENT:  No scleral injection or icterus. Endotracheal tube in place.  Cardiovascular:  Regular rate. No edema. No appreciable JVD.  Pulmonary:  Good aeration & clear to auscultation bilaterally. Symmetric chest wall rise on ventilator. Abdomen: Soft. Bowel sounds  present. Nondistended.  Neurological: Patient paralyzed & sedated. Pupils pinpoint and equal bilaterally.  LABS:  CBC  Recent Labs Lab 07/08/15 0622  07/08/15 1423 07/08/15 1606 07/09/15 0400  WBC 8.7  --   --   --  10.7*  HGB 10.9*  < > 12.2* 11.9* 11.1*  HCT 32.8*  < > 36.0* 35.0* 32.0*  PLT 131*  --   --   --  102*  < > = values in this interval not displayed. Coag's  Recent Labs Lab 07/08/15 0622 07/08/15 1420  APTT 34  --   INR 1.25 1.32   BMET  Recent Labs Lab 07/09/15 0010 07/09/15 0400 07/09/15 0744  NA 138 142 142  K 3.8 3.6 3.4*  CL 109 110 111  CO2 19* 18* 17*  BUN 5* 7 8  CREATININE 1.20 1.22 1.17  GLUCOSE 108* 97 91   Electrolytes  Recent Labs Lab 07/08/15 0622  07/09/15 0010 07/09/15 0400 07/09/15 0744  CALCIUM 8.1*  < > 7.1* 7.4* 7.3*  MG 1.0*  --   --   --   --   < > = values in this interval not displayed. Sepsis Markers  Recent Labs Lab 07/08/15 0628  LATICACIDVEN 6.40*   ABG  Recent Labs Lab 07/08/15 0711 07/08/15 1130  PHART 7.247* 7.397  PCO2ART 44.7 20.4*  PO2ART 347.0* 218*   Liver Enzymes  Recent Labs Lab 07/08/15 0622  AST 229*  ALT 34  ALKPHOS 99  BILITOT 0.7  ALBUMIN 2.6*   Cardiac Enzymes  Recent Labs Lab 07/08/15 0622 07/08/15 1250  TROPONINI <0.03 0.37*   Glucose  Recent Labs Lab 07/08/15 2206 07/09/15 0005 07/09/15 0219 07/09/15 0349 07/09/15 0546 07/09/15 0736  GLUCAP 102* 98 97 90 77 89    Imaging No results found.   ASSESSMENT / PLAN:  PULMONARY OETT 10/11>> A: Respiratory Failure Post Arrest Tracheal tear (ill assume traumatic intubation)  P:   Vent bundle PRN BD ENT consult See ID section  CARDIOVASCULAR CVL ij 10/11>>> A:  Post V fib arrest H/o DVT  P:  Cardiology following Hypothermia protocol Heparin gtt Levophed to maintain MAP  RENAL A:  Acute Renal Failure - Improving Lactic acidosis  P:   Monitoring UOP w/ Foley Trending daily  BUN/Creatinine Trending Lactic Acid q6hr Monitoring electrolytes per Hypothermia Protocol  GASTROINTESTINAL A:  No acute issues  P:   Protonix IV daily Holding on tube feeds at this time  HEMATOLOGIC A:   Anemia - blood loss from tracheal tear Thrombocytopenia  P:  Trending Hgb daily w/ CBC Trending Platelets daily with CBC Heparin gtt per protocol  INFECTIOUS A:   Tracheal tear  P:   Unasyn 10/11>>  ENDOCRINE A:  At risk hyperglcyemia with hypothermia  P:   Accuchecks q1hr  NEUROLOGIC A:  At risk severe anoxic brain injury - EEG w/o seizure activity Sedated and with NMB ETOH use - at risk for withdrawal  P:   RASS goal: 0 Sedate per protocol Versed gtt Fentanyl gtt Thiamine IV daily Folic acid IV daily   FAMILY  - Updates: Patient's wife and other family members updated by me at bedside.  - Inter-disciplinary family meet or Palliative Care meeting due by:  10/18  TODAY'S SUMMARY:   64 yo AAM with incomplete pmh but known HTN, Gout, Hyperlipidemia, ETOH abuse who was noted to have respiratory distress early am 10/11 and EMS was activated. He was down for approximately 10-20 minutes AND  when EMS he was in V fib. Shocked x 1 and epi x1 with return of circulation. Intubated and transported to [facility] ED. He was taken for CT of chest and head to rule out PE and or Neurological injury. Patient continuing on a cooling protocol. We'll await rewarming to assess neurological injury. Patient's family wished to make him a DO NOT RESUSCITATE at this point.  I have spent a total of 39 minutes of critical care time today caring for the patient, updating the patient's family, & reviewing the patient's electronic medical record.  Sonia Baller Ashok Cordia, M.D. [facility] Critical [facility] Pager:  [phone] After 3pm or if no response, call [phone]  07/09/2015 11:06 AM

## 2015-07-09 NOTE — Progress Notes (Signed)
CRITICAL VALUE ALERT  Critical value received:  Magnesium 0.9  Date of notification:  10/12    Time of notification:  1305   Critical value read back:Yes.    Nurse who received alert:  Dennison Mascot  MD notified (1st page):  Dr.Nestor  Time of first page: 1306  Responding MD:  Dr.Nestor, electrolytes replaced

## 2015-07-09 NOTE — Progress Notes (Signed)
EKG CRITICAL VALUE     12 lead EKG performed.  Critical value noted.  Lianne Bushy, RN notified.   Martie Lee, CCT 07/09/2015 6:45 AM

## 2015-07-09 NOTE — Progress Notes (Signed)
Subjective:  Intubated and sedated. No obvious purposeful movements, wife at bedside.  Objective:  Vital Signs in the last 24 hours: Temp:  [88.9 F (31.6 C)-99 F (37.2 C)] 90.5 F (32.5 C) (10/12 0900) Pulse Rate:  [54-114] 64 (10/12 0900) Resp:  [16-31] 28 (10/12 0900) BP: (101-130)/(69-101) 122/69 mmHg (10/12 0756) SpO2:  [100 %] 100 % (10/12 0900) FiO2 (%):  [40 %-50 %] 40 % (10/12 0800) Weight:  [80 kg (176 lb 5.9 oz)] 80 kg (176 lb 5.9 oz) (10/11 1000)  Intake/Output from previous day: 10/11 0701 - 10/12 0700 In: 1053.6 [I.V.:703.6; IV Piggyback:350] Out: 2065 [Urine:1965; Emesis/NG output:100]  Physical Exam:   General appearance: sedated Eyes: Dolls eve movement absent Neck: no carotid bruit, supple, symmetrical, trachea midline and not able to completely evaluate due to ET tube and central lines Neck: JVP - normal, carotids 2+= without bruits Resp: clear to auscultation bilaterally Chest wall: cooling pads noted Cardio: regular rate and rhythm, S1, S2 normal, no murmur, click, rub or gallop and distant heart sounds GI: soft, non-tender; bowel sounds normal; no masses,  no organomegaly Extremities: Cool due to cooling protocol. No signs of arterial insufficiency    Lab Results: BMP  Recent Labs  07/09/15 0010 07/09/15 0400 07/09/15 0744  NA 138 142 142  K 3.8 3.6 3.4*  CL 109 110 111  CO2 19* 18* 17*  GLUCOSE 108* 97 91  BUN 5* 7 8  CREATININE 1.20 1.22 1.17  CALCIUM 7.1* 7.4* 7.3*  GFRNONAA >60 >60 >60  GFRAA >60 >60 >60    CBC  Recent Labs Lab 07/08/15 0622  07/09/15 0400  WBC 8.7  --  10.7*  RBC 3.44*  --  3.55*  HGB 10.9*  < > 11.1*  HCT 32.8*  < > 32.0*  PLT 131*  --  102*  MCV 95.3  --  90.1  MCH 31.7  --  31.3  MCHC 33.2  --  34.7  RDW 14.5  --  13.7  LYMPHSABS 3.1  --   --   MONOABS 0.6  --   --   EOSABS 0.1  --   --   BASOSABS 0.0  --   --   < > = values in this interval not displayed.  HEMOGLOBIN A1C No results found  for: HGBA1C, MPG  Cardiac Panel (last 3 results)  Recent Labs  07/08/15 0622 07/08/15 1250  TROPONINI <0.03 0.37*    BNP (last 3 results) No results for input(s): PROBNP in the last 8760 hours.  TSH No results for input(s): TSH in the last 8760 hours.  CHOLESTEROL No results for input(s): CHOL in the last 8760 hours.  Hepatic Function Panel  Recent Labs  07/08/15 0622  PROT 6.2*  ALBUMIN 2.6*  AST 229*  ALT 34  ALKPHOS 99  BILITOT 0.7    Imaging: Imaging results have been reviewed  Cardiac Studies: EKG 07/09/2015: Normal sinus rhythm at rate of 64 bpm, prolonged routine trauma, T wave inversion, cannot exclude inferior ischemia.  Abnormal EKG.  Compared to EKG 07/08/2015: Bronchus QT interval is new.  Sinus tachycardia no longer present. Assessment/Plan:  1.  Cardiac arrest 2.  Abnormal EKG 3.  Anoxic injury Recommendation: We will await rewarming to evaluate his neurologic recovery.  By EKG it appears that he has significant pain injury.  I had a long discussion with the patient's wife at the bedside about abnormal EKG, PVCs that is noted on telemetry, risk of recurrent VT.  I suspect the EKG abnormality is probably due to anoxic injury and CNS etiology.  I will repeat serum troponin this morning for trending cardiac markers.  Echo would be helpful, however I'll wait rewarming.  I have discussed end-of-life issues with the patient's wife who agrees that in the event of recurrent cardiac arrest  especially in view of CNS issues, not to have prolonged CPR.  She understands that long-term outlook, short-term outlook is fairly critical at this point.  I have also discussed this with Dr. Tera Partridge, pulmonary critical care.   Adrian Prows, M.D. 07/09/2015, 9:27 AM [facility] Pager: [phone] Office: [phone] If no answer: [phone]

## 2015-07-09 NOTE — Progress Notes (Signed)
Upon assessment, patient had blood oozing from central line, hematuria, and blood coming out of OG tube/oral airway. Heparin stopped at 0700. Pharmacy made aware. Will continue to monitor and address on morning rounds with MD.

## 2015-07-10 DIAGNOSIS — I10 Essential (primary) hypertension: Secondary | ICD-10-CM

## 2015-07-10 LAB — CBC WITH DIFFERENTIAL/PLATELET
BASOS ABS: 0 10*3/uL (ref 0.0–0.1)
Basophils Relative: 0 %
EOS ABS: 0.1 10*3/uL (ref 0.0–0.7)
EOS PCT: 0 %
HCT: 28.8 % — ABNORMAL LOW (ref 39.0–52.0)
HEMOGLOBIN: 10.1 g/dL — AB (ref 13.0–17.0)
Lymphocytes Relative: 5 %
Lymphs Abs: 0.7 10*3/uL (ref 0.7–4.0)
MCH: 32.1 pg (ref 26.0–34.0)
MCHC: 35.1 g/dL (ref 30.0–36.0)
MCV: 91.4 fL (ref 78.0–100.0)
Monocytes Absolute: 1 10*3/uL (ref 0.1–1.0)
Monocytes Relative: 7 %
NEUTROS PCT: 88 %
Neutro Abs: 12.3 10*3/uL — ABNORMAL HIGH (ref 1.7–7.7)
PLATELETS: 97 10*3/uL — AB (ref 150–400)
RBC: 3.15 MIL/uL — AB (ref 4.22–5.81)
RDW: 15.3 % (ref 11.5–15.5)
WBC: 13.9 10*3/uL — AB (ref 4.0–10.5)

## 2015-07-10 LAB — BASIC METABOLIC PANEL
ANION GAP: 11 (ref 5–15)
ANION GAP: 14 (ref 5–15)
BUN: 10 mg/dL (ref 6–20)
BUN: 9 mg/dL (ref 6–20)
CALCIUM: 7.3 mg/dL — AB (ref 8.9–10.3)
CALCIUM: 7.4 mg/dL — AB (ref 8.9–10.3)
CO2: 17 mmol/L — ABNORMAL LOW (ref 22–32)
CO2: 18 mmol/L — AB (ref 22–32)
CREATININE: 1.72 mg/dL — AB (ref 0.61–1.24)
CREATININE: 2.1 mg/dL — AB (ref 0.61–1.24)
Chloride: 112 mmol/L — ABNORMAL HIGH (ref 101–111)
Chloride: 113 mmol/L — ABNORMAL HIGH (ref 101–111)
GFR calc non Af Amer: 31 mL/min — ABNORMAL LOW (ref >60)
GFR calc non Af Amer: 40 mL/min — ABNORMAL LOW (ref >60)
GFR, EST AFRICAN AMERICAN: 46 mL/min — AB (ref >60)
GFR, EST AFRICAN AMERICAN: 36 mL/min — AB (ref >60)
GLUCOSE: 80 mg/dL (ref 65–99)
Glucose, Bld: 81 mg/dL (ref 65–99)
Potassium: 4.2 mmol/L (ref 3.5–5.1)
Potassium: 4.4 mmol/L (ref 3.5–5.1)
SODIUM: 143 mmol/L (ref 135–145)
Sodium: 142 mmol/L (ref 135–145)

## 2015-07-10 LAB — GLUCOSE, CAPILLARY
GLUCOSE-CAPILLARY: 42 mg/dL — AB (ref 65–99)
GLUCOSE-CAPILLARY: 60 mg/dL — AB (ref 65–99)
GLUCOSE-CAPILLARY: 61 mg/dL — AB (ref 65–99)
GLUCOSE-CAPILLARY: 63 mg/dL — AB (ref 65–99)
GLUCOSE-CAPILLARY: 74 mg/dL (ref 65–99)
GLUCOSE-CAPILLARY: 83 mg/dL (ref 65–99)
GLUCOSE-CAPILLARY: 97 mg/dL (ref 65–99)
Glucose-Capillary: 74 mg/dL (ref 65–99)
Glucose-Capillary: 53 mg/dL — ABNORMAL LOW (ref 65–99)
Glucose-Capillary: 42 mg/dL — CL (ref 65–99)
Glucose-Capillary: 54 mg/dL — ABNORMAL LOW (ref 65–99)
Glucose-Capillary: 89 mg/dL (ref 65–99)

## 2015-07-10 LAB — BLOOD GAS, ARTERIAL
ACID-BASE DEFICIT: 9.2 mmol/L — AB (ref 0.0–2.0)
Bicarbonate: 15.5 mEq/L — ABNORMAL LOW (ref 20.0–24.0)
DRAWN BY: 103701
FIO2: 0.4
LHR: 28 {breaths}/min
MECHVT: 550 mL
O2 SAT: 98.4 %
PATIENT TEMPERATURE: 96.8
PEEP/CPAP: 5 cmH2O
TCO2: 16.5 mmol/L (ref 0–100)
pCO2 arterial: 28.8 mmHg — ABNORMAL LOW (ref 35.0–45.0)
pH, Arterial: 7.345 — ABNORMAL LOW (ref 7.350–7.450)
pO2, Arterial: 137 mmHg — ABNORMAL HIGH (ref 80.0–100.0)

## 2015-07-10 LAB — CBC
HCT: 33.3 % — ABNORMAL LOW (ref 39.0–52.0)
Hemoglobin: 11.6 g/dL — ABNORMAL LOW (ref 13.0–17.0)
MCH: 31.8 pg (ref 26.0–34.0)
MCHC: 34.8 g/dL (ref 30.0–36.0)
MCV: 91.2 fL (ref 78.0–100.0)
PLATELETS: 122 10*3/uL — AB (ref 150–400)
RBC: 3.65 MIL/uL — AB (ref 4.22–5.81)
RDW: 14.3 % (ref 11.5–15.5)
WBC: 17.1 10*3/uL — ABNORMAL HIGH (ref 4.0–10.5)

## 2015-07-10 LAB — MAGNESIUM: Magnesium: 2.3 mg/dL (ref 1.7–2.4)

## 2015-07-10 LAB — HEPATIC FUNCTION PANEL
ALBUMIN: 2.3 g/dL — AB (ref 3.5–5.0)
ALK PHOS: 81 U/L (ref 38–126)
ALT: 29 U/L (ref 17–63)
AST: 62 U/L — ABNORMAL HIGH (ref 15–41)
BILIRUBIN INDIRECT: 0.2 mg/dL — AB (ref 0.3–0.9)
BILIRUBIN TOTAL: 0.4 mg/dL (ref 0.3–1.2)
Bilirubin, Direct: 0.2 mg/dL (ref 0.1–0.5)
Total Protein: 6.1 g/dL — ABNORMAL LOW (ref 6.5–8.1)

## 2015-07-10 LAB — PHOSPHORUS: PHOSPHORUS: 3.9 mg/dL (ref 2.5–4.6)

## 2015-07-10 LAB — TRIGLYCERIDES: TRIGLYCERIDES: 189 mg/dL — AB (ref <150)

## 2015-07-10 MED ORDER — CLONAZEPAM 0.5 MG PO TBDP: 2.0000 mg | ORAL_TABLET | Freq: Four times a day (QID) | ORAL | Status: DC

## 2015-07-10 MED ORDER — DEXTROSE 50 % IV SOLN
25.0000 mL | Freq: Once | INTRAVENOUS | Status: AC
  Administered 2015-07-10: 25 mL via INTRAVENOUS
  Filled 2015-07-10: qty 50

## 2015-07-10 MED ORDER — HYDROCORTISONE NA SUCCINATE PF 100 MG IJ SOLR
100.0000 mg | Freq: Two times a day (BID) | INTRAMUSCULAR | Status: DC
  Administered 2015-07-10: 100 mg via INTRAVENOUS
  Filled 2015-07-10: qty 2

## 2015-07-10 MED ORDER — CLONAZEPAM 0.1 MG/ML ORAL SUSPENSION: 2.0000 mg | Freq: Four times a day (QID) | ORAL | Status: DC

## 2015-07-10 MED ORDER — DEXTROSE 50 % IV SOLN
INTRAVENOUS | Status: AC
  Administered 2015-07-10: 25 mL
  Filled 2015-07-10: qty 50

## 2015-07-10 MED ORDER — DEXTROSE 50 % IV SOLN
25.0000 mL | Freq: Once | INTRAVENOUS | Status: AC
  Administered 2015-07-10: 25 mL via INTRAVENOUS

## 2015-07-10 MED ORDER — FENTANYL CITRATE (PF) 100 MCG/2ML IJ SOLN
50.0000 ug | INTRAMUSCULAR | Status: DC | PRN
  Administered 2015-07-10 (×3): 50 ug via INTRAVENOUS
  Filled 2015-07-10: qty 2

## 2015-07-10 MED ORDER — DEXTROSE 50 % IV SOLN
INTRAVENOUS | Status: AC
  Administered 2015-07-10: 50 mL
  Filled 2015-07-10: qty 50

## 2015-07-10 MED ORDER — SODIUM CHLORIDE 0.45 % IV SOLN: INTRAVENOUS | Status: DC

## 2015-07-10 MED ORDER — METOPROLOL TARTRATE 1 MG/ML IV SOLN
5.0000 mg | Freq: Four times a day (QID) | INTRAVENOUS | Status: DC
  Administered 2015-07-11 – 2015-07-14 (×11): 5 mg via INTRAVENOUS
  Filled 2015-07-10 (×12): qty 5

## 2015-07-10 MED ORDER — LORAZEPAM 2 MG/ML IJ SOLN
1.0000 mg | Freq: Three times a day (TID) | INTRAMUSCULAR | Status: DC
  Administered 2015-07-10: 1 mg via INTRAVENOUS

## 2015-07-10 MED ORDER — FENTANYL CITRATE (PF) 100 MCG/2ML IJ SOLN
100.0000 ug | INTRAMUSCULAR | Status: DC | PRN
  Administered 2015-07-11: 100 ug via INTRAVENOUS
  Filled 2015-07-10: qty 2

## 2015-07-10 MED ORDER — SODIUM BICARBONATE 8.4 % IV SOLN
INTRAVENOUS | Status: DC
  Administered 2015-07-10: 14:00:00 via INTRAVENOUS
  Filled 2015-07-10 (×5): qty 900

## 2015-07-10 MED ORDER — VITAL AF 1.2 CAL PO LIQD
1000.0000 mL | ORAL | Status: DC
Start: 2015-07-10 — End: 2015-07-12
  Administered 2015-07-10: 1000 mL
  Filled 2015-07-10 (×4): qty 1000

## 2015-07-10 MED ORDER — VITAL HIGH PROTEIN PO LIQD
1000.0000 mL | ORAL | Status: DC
  Filled 2015-07-10 (×2): qty 1000

## 2015-07-10 MED ORDER — ADULT MULTIVITAMIN LIQUID CH
5.0000 mL | Freq: Every day | ORAL | Status: DC
  Administered 2015-07-18: 5 mL
  Filled 2015-07-10 (×11): qty 5

## 2015-07-10 MED ORDER — LORAZEPAM 2 MG/ML IJ SOLN
2.0000 mg | INTRAMUSCULAR | Status: DC | PRN
Start: 2015-07-10 — End: 2015-07-12
  Administered 2015-07-11 – 2015-07-12 (×4): 2 mg via INTRAVENOUS
  Filled 2015-07-10 (×5): qty 1

## 2015-07-10 MED ORDER — FENTANYL CITRATE (PF) 100 MCG/2ML IJ SOLN
INTRAMUSCULAR | Status: AC
  Filled 2015-07-10: qty 2

## 2015-07-10 MED ORDER — MIDAZOLAM BOLUS VIA INFUSION
2.0000 mg | INTRAVENOUS | Status: DC | PRN
  Filled 2015-07-10: qty 2

## 2015-07-10 MED ORDER — DEXTROSE 50 % IV SOLN: 25.0000 mL | Freq: Once | INTRAVENOUS | Status: AC

## 2015-07-10 MED ORDER — SODIUM CHLORIDE 0.9 % IV SOLN
INTRAVENOUS | Status: DC
  Administered 2015-07-09 – 2015-07-13 (×2): via INTRAVENOUS

## 2015-07-10 MED ORDER — SODIUM CHLORIDE 0.9 % IV BOLUS (SEPSIS)
750.0000 mL | Freq: Once | INTRAVENOUS | Status: AC
  Administered 2015-07-10: 750 mL via INTRAVENOUS

## 2015-07-10 MED ORDER — ADULT MULTIVITAMIN W/MINERALS CH: 1.0000 | ORAL_TABLET | Freq: Every day | ORAL | Status: DC

## 2015-07-10 MED ORDER — LORAZEPAM 2 MG/ML IJ SOLN
INTRAMUSCULAR | Status: AC
  Filled 2015-07-10: qty 1

## 2015-07-10 NOTE — Progress Notes (Signed)
Subjective:  Remains intubated, sedation was discontinued around 1:30am.  Pt clearly aware of my presence in room, opened eyes to touch and follows movements with eyes. Some movement of left hand and arm.    Objective:  Vital Signs in the last 24 hours: Temp:  [90.5 F (32.5 C)-99.1 F (37.3 C)] 98.6 F (37 C) (10/13 0600) Pulse Rate:  [58-137] 103 (10/13 0600) Resp:  [25-28] 28 (10/13 0600) BP: (112-136)/(61-85) 136/79 mmHg (10/13 0401) SpO2:  [98 %-100 %] 98 % (10/13 0600) FiO2 (%):  [40 %] 40 % (10/13 0401)  Intake/Output from previous day: 10/12 0701 - 10/13 0700 In: 1231.3 [I.V.:831.3; IV Piggyback:400] Out: 205 [Urine:155; Emesis/NG output:50]  Physical Exam: General appearance: Mild distress on waking during exam Eyes: Follow movement with eyes purposefully Neck: no carotid bruit, supple, symmetrical, trachea midline and not able to completely evaluate due to ET tube and central lines Resp: clear to auscultation bilaterally Cardio: regular rate and rhythm, S1, S2 normal, no murmur, click, rub or gallop and distant heart sounds GI: soft, non-tender; bowel sounds normal; no masses,  no organomegaly Extremities: No signs of arterial insufficiency  Lab Results: BMP  Recent Labs  07/09/15 2015 07/10/15 07/10/15 0346  NA 139 142 143  K 3.5 4.2 4.4  CL 112* 113* 112*  CO2 17* 18* 17*  GLUCOSE 85 80 81  BUN 9 9 10   CREATININE 1.60* 1.72* 2.10*  CALCIUM 7.0* 7.3* 7.4*  GFRNONAA 44* 40* 31*  GFRAA 51* 46* 36*    CBC  Recent Labs Lab 07/08/15 0622  07/10/15 0346  WBC 8.7  < > 17.1*  RBC 3.44*  < > 3.65*  HGB 10.9*  < > 11.6*  HCT 32.8*  < > 33.3*  PLT 131*  < > 122*  MCV 95.3  < > 91.2  MCH 31.7  < > 31.8  MCHC 33.2  < > 34.8  RDW 14.5  < > 14.3  LYMPHSABS 3.1  --   --   MONOABS 0.6  --   --   EOSABS 0.1  --   --   BASOSABS 0.0  --   --   < > = values in this interval not displayed.  HEMOGLOBIN A1C No results found for: HGBA1C, MPG  Cardiac Panel  (last 3 results)  Recent Labs  07/08/15 0622 07/08/15 1250 07/09/15 1500  TROPONINI <0.03 0.37* 0.25*    BNP (last 3 results) No results for input(s): PROBNP in the last 8760 hours.  TSH No results for input(s): TSH in the last 8760 hours.  CHOLESTEROL No results for input(s): CHOL in the last 8760 hours.  Hepatic Function Panel  Recent Labs  07/08/15 0622  PROT 6.2*  ALBUMIN 2.6*  AST 229*  ALT 34  ALKPHOS 99  BILITOT 0.7    Imaging: Imaging results have been reviewed  Cardiac Studies: EKG 07/09/2015: Normal sinus rhythm at rate of 64 bpm, prolonged routine trauma, T wave inversion, cannot exclude inferior ischemia.  Abnormal EKG.  Compared to EKG 07/08/2015: Bronchus QT interval is new.  Sinus tachycardia no longer present.  Assessment/Plan:  1.  Cardiac arrest 2.  Abnormal EKG 3.  Anoxic injury  Recommendation: Troponin remains flat.  Pt opened eyes during exam and followed my movements with eyes purposefully. Heart rate and blood pressure increased as it appeared he became aware of his surroundings.  As cognitive function appears to be returning since rewarming and withdrawing sedation, will go ahead and obtain echocardiogram to  evaluate cardiac function. No family present during exam.  Rachel Bo, NP-C 07/10/2015, 7:54 AM [facility] Pager: [phone] Office: [phone]

## 2015-07-10 NOTE — Progress Notes (Signed)
eLink Physician-Brief Progress Note Patient Name: Barak Friar DOB: 03-Nov-1950 MRN: [id number]   Date of Service  07/10/2015  HPI/Events of Note  Persistent borderline hypoglycmia despite no insulin rx/ glucose already in IV  eICU Interventions  Hydrocortisone 100 mg IV now and q12     Intervention Category Major Interventions: Seizures - evaluation and management Intermediate Interventions: Other:  Christinia Gully 07/10/2015, 6:58 PM

## 2015-07-10 NOTE — Progress Notes (Signed)
[place] Progress Note Patient Name: Mitchell [name] DOB: 11-Mar-1951 MRN: [id number]   Date of Service  07/10/2015  HPI/Events of Note  Having jerking episodes with eeg not suggesting sz so this is probably myoclonus  eICU Interventions  Try clonazepam 2 mg q 6 and increase ativan       Intervention Category Major Interventions: Seizures - evaluation and management  Christinia Gully 07/10/2015, 6:56 PM

## 2015-07-10 NOTE — Progress Notes (Signed)
Initial Nutrition Assessment   INTERVENTION:   Initiate Vital AF 1.2 @ 25 ml/hr via OG tube and increase by 10 ml every 4 hours to goal rate of 65 ml/hr.   Tube feeding regimen provides 1872 kcal (100% of needs), 117 grams of protein, and 1265 ml of H2O.    NUTRITION DIAGNOSIS:   Inadequate oral intake related to inability to eat as evidenced by NPO status.  GOAL:   Patient will meet greater than or equal to 90% of their needs  MONITOR:   Labs, I & O's, Weight trends, TF tolerance, Vent status  REASON FOR ASSESSMENT:   Consult Enteral/tube feeding initiation and management  ASSESSMENT:   Pt with hx of ETOH abuse, per wife very sedentary with declining health status and appetite for the last several months. Pt admitted after arrest at home, CPR by wife.   Patient is currently intubated on ventilator support MV: 12.8 L/min Temp (24hrs), Avg:96.5 F (35.8 C), Min:91.9 F (33.3 C), Max:99.1 F (37.3 C)   Medications reviewed and include: folic acid, thiamine Labs reviewed: TG 189  Nutrition-Focused physical exam completed. Findings are no fat depletion, mild/moderate muscle depletion, and mild edema.   Pt awake on ventilator. Nurse at bedside. No family present. Per RN family reported pt drinks a lot of ETOH each day. Noted pt shaking, RN felt due to withdrawals.   Diet Order:    NPO  Skin:  Reviewed, no issues  Last BM:  10/11  Height:   Ht Readings from Last 1 Encounters:  No data found for Ht   Weight:   Wt Readings from Last 1 Encounters:  07/08/15 176 lb 5.9 oz (80 kg)   Ideal Body Weight:  70 kg  BMI:  Body mass index is 26.82 kg/(m^2).  Estimated Nutritional Needs:   Kcal:  [id number]  Protein:  100-120 grams  Fluid:  >1.8 L/day  EDUCATION NEEDS:   No education needs identified at this time  [place], [place], [place] Pager [phone] After Hours Pager

## 2015-07-10 NOTE — Progress Notes (Signed)
PULMONARY / CRITICAL CARE MEDICINE   Name: Tennis Mckinnon MRN: [id number] DOB: 1950-12-18    ADMISSION DATE:  07/08/2015   REFERRING MD :  EDP  CHIEF COMPLAINT:  S/P Arrest  INITIAL PRESENTATION: Post arrest  STUDIES:  10/11 ct head - atrophy mild, neg acute 10/11 ct angio chest - There is extensive air in the neck and upper mediastinal region. Air is noted just anterior to the trachea in the lower neck region 10/11 EEG - attenuated background rhythm. No seizure activity.  SIGNIFICANT EVENTS: 10/11 - Admitted post cardiac arrest & cooling initiated 10/12 - Rewarming  SUBJECTIVE: eyes opening, more awake  VITAL SIGNS: Temp:  [90.5 F (32.5 C)-99.1 F (37.3 C)] 98.6 F (37 C) (10/13 0600) Pulse Rate:  [63-137] 103 (10/13 0600) Resp:  [25-28] 28 (10/13 0600) BP: (112-136)/(61-85) 136/79 mmHg (10/13 0401) SpO2:  [98 %-100 %] 98 % (10/13 0600) FiO2 (%):  [40 %] 40 % (10/13 0401) HEMODYNAMICS: CVP:  [4 mmHg-8 mmHg] 4 mmHg VENTILATOR SETTINGS: Vent Mode:  [-] PRVC FiO2 (%):  [40 %] 40 % Set Rate:  [28 bmp] 28 bmp Vt Set:  [550 mL] 550 mL PEEP:  [5 cmH20] 5 cmH20 Plateau Pressure:  [16 cmH20-21 cmH20] 21 cmH20 INTAKE / OUTPUT:  Intake/Output Summary (Last 24 hours) at 07/10/15 0813 Last data filed at 07/10/15 0600  Gross per 24 hour  Intake 1202.53 ml  Output    205 ml  Net 997.53 ml    PHYSICAL EXAMINATION: General: awakans, mild aggitation HEENT: no change in min crep Cardiovascular:  Regular rate. No edema. No appreciable JVD.  Pulmonary:  coarse Abdomen: Soft. Bowel sounds present. Nondistended.  Neurological: awake, eyes open, does not follow commands   LABS:  CBC  Recent Labs Lab 07/08/15 0622  07/08/15 1606 07/09/15 0400 07/10/15 0346  WBC 8.7  --   --  10.7* 17.1*  HGB 10.9*  < > 11.9* 11.1* 11.6*  HCT 32.8*  < > 35.0* 32.0* 33.3*  PLT 131*  --   --  102* 122*  < > = values in this interval not displayed. Coag's  Recent Labs Lab  07/08/15 0622 07/08/15 1420  APTT 34  --   INR 1.25 1.32   BMET  Recent Labs Lab 07/09/15 2015 07/10/15 07/10/15 0346  NA 139 142 143  K 3.5 4.2 4.4  CL 112* 113* 112*  CO2 17* 18* 17*  BUN '9 9 10  ' CREATININE 1.60* 1.72* 2.10*  GLUCOSE 85 80 81   Electrolytes  Recent Labs Lab 07/09/15 1137 07/09/15 1500 07/09/15 2015 07/10/15 07/10/15 0346  CALCIUM 7.0* 7.2* 7.0* 7.3* 7.4*  MG 0.9* 1.8  --   --  2.3  PHOS  --   --   --   --  3.9   Sepsis Markers  Recent Labs Lab 07/08/15 0628 07/09/15 1137 07/09/15 1730  LATICACIDVEN 6.40* 1.4 1.3   ABG  Recent Labs Lab 07/08/15 0711 07/08/15 1130  PHART 7.247* 7.397  PCO2ART 44.7 20.4*  PO2ART 347.0* 218*   Liver Enzymes  Recent Labs Lab 07/08/15 0622  AST 229*  ALT 34  ALKPHOS 99  BILITOT 0.7  ALBUMIN 2.6*   Cardiac Enzymes  Recent Labs Lab 07/08/15 0622 07/08/15 1250 07/09/15 1500  TROPONINI <0.03 0.37* 0.25*   Glucose  Recent Labs Lab 07/09/15 0736 07/09/15 1142 07/09/15 1557 07/09/15 2001 07/09/15 2359 07/10/15 0345  GLUCAP 89 83 72 83 74 74    Imaging No results found.  ASSESSMENT / PLAN:  PULMONARY OETT 10/11>> A: Respiratory Failure Post Arrest Tracheal tear (ill assume traumatic intubation)  P:   Now rewarmed, likley needs lower MV , likely has alk now pcxr in am  If can get MV low, LIKELY, will SBT short time  CARDIOVASCULAR CVL ij 10/11>>> A:  Post V fib arrest H/o DVT Concern ishcmia as cause  P:  Cardiology following Hypothermia protocol- to normo x 48 hr Heparin gtt, off bleeding Levophed to maintain MAP, likely can dc with lower normo rewarm goals Get echo  RENAL A:  Acute Renal Failure - ATN post arrest Lactic acidosis NONAG P:   Chem in am  Avoid free water with risk brain edema kvo cvp 4 after rewarm, bolus May need bicarb for NONAG  GASTROINTESTINAL A:  NPO  P:   Protonix IV daily Start TF  lft in am for shock  liver  HEMATOLOGIC A:   Anemia - blood loss from tracheal tear Thrombocytopenia  P:  scd Cbc in am  Heparin gtt per protocol - off bleeding  INFECTIOUS A:   Tracheal tear  P:   Unasyn 10/11>>  ENDOCRINE A:  At risk hyperglcyemia with hypothermia - controlled  P:   Accuchecks q1hr  NEUROLOGIC A:  At risk severe anoxic brain injury - EEG w/o seizure activity ETOH use - at risk for withdrawal  P:   RASS goal: 0 Sedate per protocol Consider ativan low dose to avoid wd Thiamine IV daily Folic acid IV daily   FAMILY  - Updates: i updated fmaily  - Inter-disciplinary family meet or Palliative Care meeting due by:  10/18  Ccm time 30 mijn   Lavon Paganini. Titus Mould, MD, [place] Pgr: [facility]

## 2015-07-10 NOTE — Progress Notes (Signed)
Pt with continued intermittent tremor with body temperature 37/water temperature 30 per normothermia protocol; pt also continues with low blood sugars; MD elink aware and orders received; will continue to closely monitor

## 2015-07-10 NOTE — Progress Notes (Signed)
Pt opens eyes and tracks movement in room; able to follow simple commands-squeeze both hands, wiggle your toes, and able to answer simple yes/no questions-do you hurt, are you cold; frequent reorientation provided; family at bedside; questions encouraged and answered; will continue to closely monitor

## 2015-07-10 NOTE — Progress Notes (Signed)
eLink Physician-Brief Progress Note Patient Name: Mitchell [name] DOB: [birth year] MRN: [id number]   Date of Service  07/10/2015  HPI/Events of Note  Nursing now reports following commands so clearly can't be myoclonus  eICU Interventions  D/c clonazapam/ d/c normothermia protocol as causing severe shivering     Intervention Category Major Interventions: Change in mental status - evaluation and management  Christinia Gully 07/10/2015, 7:33 PM

## 2015-07-10 NOTE — Progress Notes (Signed)
Versed 55mL and fentanyl 156mL wasted per protocol down sink with RN x 2

## 2015-07-11 ENCOUNTER — Inpatient Hospital Stay (HOSPITAL_COMMUNITY): Payer: Medicare Other

## 2015-07-11 DIAGNOSIS — I4901 Ventricular fibrillation: Secondary | ICD-10-CM | POA: Diagnosis not present

## 2015-07-11 DIAGNOSIS — Z9289 Personal history of other medical treatment: Secondary | ICD-10-CM

## 2015-07-11 DIAGNOSIS — E785 Hyperlipidemia, unspecified: Secondary | ICD-10-CM

## 2015-07-11 LAB — BLOOD GAS, ARTERIAL
Acid-base deficit: 5.6 mmol/L — ABNORMAL HIGH (ref 0.0–2.0)
BICARBONATE: 17.9 meq/L — AB (ref 20.0–24.0)
Drawn by: 398981
FIO2: 0.4
LHR: 28 {breaths}/min
MECHVT: 550 mL
O2 Saturation: 99.5 %
PATIENT TEMPERATURE: 99.1
PEEP: 5 cmH2O
TCO2: 18.7 mmol/L (ref 0–100)
pCO2 arterial: 27.8 mmHg — ABNORMAL LOW (ref 35.0–45.0)
pH, Arterial: 7.427 (ref 7.350–7.450)
pO2, Arterial: 211 mmHg — ABNORMAL HIGH (ref 80.0–100.0)

## 2015-07-11 LAB — COMPREHENSIVE METABOLIC PANEL
ALBUMIN: 1.9 g/dL — AB (ref 3.5–5.0)
ALK PHOS: 64 U/L (ref 38–126)
ALT: 21 U/L (ref 17–63)
ANION GAP: 8 (ref 5–15)
AST: 35 U/L (ref 15–41)
BUN: 16 mg/dL (ref 6–20)
CALCIUM: 7 mg/dL — AB (ref 8.9–10.3)
CHLORIDE: 113 mmol/L — AB (ref 101–111)
CO2: 19 mmol/L — AB (ref 22–32)
Creatinine, Ser: 2.41 mg/dL — ABNORMAL HIGH (ref 0.61–1.24)
GFR calc non Af Amer: 27 mL/min — ABNORMAL LOW (ref >60)
GFR, EST AFRICAN AMERICAN: 31 mL/min — AB (ref >60)
GLUCOSE: 112 mg/dL — AB (ref 65–99)
POTASSIUM: 3.9 mmol/L (ref 3.5–5.1)
Sodium: 140 mmol/L (ref 135–145)
Total Bilirubin: 0.6 mg/dL (ref 0.3–1.2)
Total Protein: 5.5 g/dL — ABNORMAL LOW (ref 6.5–8.1)

## 2015-07-11 LAB — GLUCOSE, CAPILLARY
Glucose-Capillary: 100 mg/dL — ABNORMAL HIGH (ref 65–99)
Glucose-Capillary: 106 mg/dL — ABNORMAL HIGH (ref 65–99)
Glucose-Capillary: 113 mg/dL — ABNORMAL HIGH (ref 65–99)
Glucose-Capillary: 83 mg/dL (ref 65–99)
Glucose-Capillary: 87 mg/dL (ref 65–99)
Glucose-Capillary: 96 mg/dL (ref 65–99)

## 2015-07-11 LAB — CBC WITH DIFFERENTIAL/PLATELET
Basophils Absolute: 0 10*3/uL (ref 0.0–0.1)
Basophils Relative: 0 %
Eosinophils Absolute: 0 10*3/uL (ref 0.0–0.7)
Eosinophils Relative: 0 %
HEMATOCRIT: 26.2 % — AB (ref 39.0–52.0)
HEMOGLOBIN: 9 g/dL — AB (ref 13.0–17.0)
LYMPHS ABS: 0.3 10*3/uL — AB (ref 0.7–4.0)
LYMPHS PCT: 3 %
MCH: 31.7 pg (ref 26.0–34.0)
MCHC: 34.4 g/dL (ref 30.0–36.0)
MCV: 92.3 fL (ref 78.0–100.0)
MONO ABS: 0.4 10*3/uL (ref 0.1–1.0)
MONOS PCT: 4 %
NEUTROS ABS: 9.3 10*3/uL — AB (ref 1.7–7.7)
NEUTROS PCT: 94 %
Platelets: 72 10*3/uL — ABNORMAL LOW (ref 150–400)
RBC: 2.84 MIL/uL — ABNORMAL LOW (ref 4.22–5.81)
RDW: 14.7 % (ref 11.5–15.5)
WBC: 10 10*3/uL (ref 4.0–10.5)

## 2015-07-11 LAB — TRIGLYCERIDES: Triglycerides: 68 mg/dL (ref <150)

## 2015-07-11 LAB — MAGNESIUM: MAGNESIUM: 2 mg/dL (ref 1.7–2.4)

## 2015-07-11 MED ORDER — HYDROCORTISONE NA SUCCINATE PF 100 MG IJ SOLR
50.0000 mg | Freq: Four times a day (QID) | INTRAMUSCULAR | Status: DC
  Administered 2015-07-11 – 2015-07-12 (×4): 50 mg via INTRAVENOUS
  Filled 2015-07-11 (×4): qty 2

## 2015-07-11 MED ORDER — PERFLUTREN LIPID MICROSPHERE
1.0000 mL | INTRAVENOUS | Status: AC | PRN
  Filled 2015-07-11: qty 10

## 2015-07-11 MED ORDER — SODIUM CHLORIDE 0.9 % IV SOLN
1.5000 g | Freq: Two times a day (BID) | INTRAVENOUS | Status: DC
  Administered 2015-07-11 – 2015-07-12 (×3): 1.5 g via INTRAVENOUS
  Filled 2015-07-11 (×5): qty 1.5

## 2015-07-11 MED ORDER — LORAZEPAM 2 MG/ML IJ SOLN
1.0000 mg | Freq: Three times a day (TID) | INTRAMUSCULAR | Status: DC
  Administered 2015-07-11 (×2): 1 mg via INTRAVENOUS
  Filled 2015-07-11 (×2): qty 1

## 2015-07-11 MED ORDER — PERFLUTREN LIPID MICROSPHERE
INTRAVENOUS | Status: AC
  Administered 2015-07-11: 2 mL
  Filled 2015-07-11: qty 10

## 2015-07-11 NOTE — Procedures (Signed)
Extubation Procedure Note  Patient Details:   Name: Mitchell [name] DOB: 11-20-1950 MRN: [id number]   Airway Documentation:     Evaluation  O2 sats: stable throughout Complications: No apparent complications Patient did tolerate procedure well. Bilateral Breath Sounds: Clear Suctioning: Airway Yes   Pt. Was extubated to a 4L [place] with RN at the bedside without any complications, dyspnea or stridor noted. Pt. Is doing well at this time & all vitals are within normal limits.  [name], Eddie North 07/11/2015, 11:50 AM

## 2015-07-11 NOTE — Progress Notes (Addendum)
PULMONARY / CRITICAL CARE MEDICINE   Name: Mitchell [name] MRN: [id number] DOB: 1950/10/16    ADMISSION DATE:  07/08/2015   REFERRING MD :  EDP  CHIEF COMPLAINT:  S/P Arrest  INITIAL PRESENTATION: Post arrest  STUDIES:  10/11 ct head - atrophy mild, neg acute 10/11 ct angio chest - There is extensive air in the neck and upper mediastinal region. Air is noted just anterior to the trachea in the lower neck region 10/11 EEG - attenuated background rhythm. No seizure activity.  SIGNIFICANT EVENTS: 10/11 - Admitted post cardiac arrest & cooling initiated 10/12 - Rewarming 10/13- awake, followed commands, normothermia stopped for severe shivering   SUBJECTIVE: eyes opening, more awake  VITAL SIGNS: Temp:  [97.5 F (36.4 C)-99.3 F (37.4 C)] 98.8 F (37.1 C) (10/14 0700) Pulse Rate:  [49-129] 104 (10/14 0700) Resp:  [16-28] 17 (10/14 0700) BP: (90-146)/(46-87) 132/75 mmHg (10/14 0700) SpO2:  [99 %-100 %] 100 % (10/14 0700) FiO2 (%):  [40 %] 40 % (10/14 0332) Weight:  [80.8 kg (178 lb 2.1 oz)] 80.8 kg (178 lb 2.1 oz) (10/14 0350) HEMODYNAMICS: CVP:  [5 mmHg-9 mmHg] 9 mmHg VENTILATOR SETTINGS: Vent Mode:  [-] PRVC FiO2 (%):  [40 %] 40 % Set Rate:  [28 bmp] 28 bmp Vt Set:  [550 mL] 550 mL PEEP:  [5 cmH20] 5 cmH20 Plateau Pressure:  [16 cmH20-20 cmH20] 19 cmH20 INTAKE / OUTPUT:  Intake/Output Summary (Last 24 hours) at 07/11/15 0918 Last data filed at 07/11/15 0700  Gross per 24 hour  Intake 2361.31 ml  Output    470 ml  Net 1891.31 ml    PHYSICAL EXAMINATION: General: awakans, mild agitation, follows commands  HEENT: no change in min crep Cardiovascular:  Regular rate. No edema. No appreciable JVD.  Pulmonary:  Coarse improved Abdomen: Soft. Bowel sounds present. Nondistended.  Neurological: awake, eyes open, does follow commands   LABS:  CBC  Recent Labs Lab 07/10/15 0346 07/10/15 1229 07/11/15 0337  WBC 17.1* 13.9* 10.0  HGB 11.6* 10.1* 9.0*  HCT 33.3*  28.8* 26.2*  PLT 122* 97* 72*   Coag's  Recent Labs Lab 07/08/15 0622 07/08/15 1420  APTT 34  --   INR 1.25 1.32   BMET  Recent Labs Lab 07/10/15 07/10/15 0346 07/11/15 0337  NA 142 143 140  K 4.2 4.4 3.9  CL 113* 112* 113*  CO2 18* 17* 19*  BUN 9 10 16   CREATININE 1.72* 2.10* 2.41*  GLUCOSE 80 81 112*   Electrolytes  Recent Labs Lab 07/09/15 1500  07/10/15 07/10/15 0346 07/11/15 0337  CALCIUM 7.2*  < > 7.3* 7.4* 7.0*  MG 1.8  --   --  2.3 2.0  PHOS  --   --   --  3.9  --   < > = values in this interval not displayed. Sepsis Markers  Recent Labs Lab 07/08/15 0628 07/09/15 1137 07/09/15 1730  LATICACIDVEN 6.40* 1.4 1.3   ABG  Recent Labs Lab 07/08/15 1130 07/10/15 0845 07/11/15 0341  PHART 7.397 7.345* 7.427  PCO2ART 20.4* 28.8* 27.8*  PO2ART 218* 137* 211*   Liver Enzymes  Recent Labs Lab 07/08/15 0622 07/10/15 0346 07/11/15 0337  AST 229* 62* 35  ALT 34 29 21  ALKPHOS 99 81 64  BILITOT 0.7 0.4 0.6  ALBUMIN 2.6* 2.3* 1.9*   Cardiac Enzymes  Recent Labs Lab 07/08/15 0622 07/08/15 1250 07/09/15 1500  TROPONINI <0.03 0.37* 0.25*   Glucose  Recent Labs Lab 07/10/15 1738  07/10/15 1849 07/10/15 2038 07/10/15 2110 07/10/15 2349 07/11/15 0342  GLUCAP 97 61* 60* 89 96 113*    Imaging Dg Chest Port 1 View  07/11/2015  CLINICAL DATA:  Cardiac arrest.  Hypertension . EXAM: PORTABLE CHEST 1 VIEW COMPARISON:  07/08/2015. FINDINGS: Endotracheal tube, NG tube, left IJ line in stable position. Mild infiltrate left lung base. Small left pleural effusion. Mild cardiomegaly. No pulmonary venous congestion No pneumothorax IMPRESSION: 1. Lines and tubes in stable position. 2. Mild left base infiltrate and left pleural effusion. 3. Mild cardiomegaly.  No pulmonary venous congestion . Electronically Signed   By: [place]   On: 07/11/2015 07:21     ASSESSMENT / PLAN:  PULMONARY OETT 10/11>> A: Respiratory Failure Post  Arrest Tracheal tear (ill assume traumatic intubation)- mild blood from ETT  P:   bronch unlikely to see source Continue empiric abx Wean PS 5/5, needed PS 10, goal 2 hr pcxr in am  Even balance to neg goals Last abg reviewed reduce rate  CARDIOVASCULAR CVL ij 10/11>>> A:  Post V fib arrest H/o DVT Concern ishcmia as cause  P:  Cardiology following Hypothermia protocol- to normo x 48 hr, stopped for shivering (after he followed commands) Levophed to maintain MAP 60 Get echo - pending  RENAL A:  Acute Renal Failure - ATN post arrest Lactic acidosis NONAG P:   Chem in am  kvo Continue bicarb 2 amps in 1/2 NS May need renal US  GASTROINTESTINAL A:  NPO  P:   Protonix IV daily TF to goal lft in am for shock liver - wnl  HEMATOLOGIC A:   Anemia - blood loss from tracheal tear Thrombocytopenia (ETOH, dilution)  P:  scd Cbc in am  Heparin gtt per protocol - off bleeding  INFECTIOUS A:   Tracheal tear No fevers P:   Unasyn 10/11>> pcxr in am for mediastinal air assessment  ENDOCRINE A:  At risk hyperglcyemia with hypothermia - controlled BOX empirically treated for concern rel AI P:   Accuchecks q1hr Stress roids  NEUROLOGIC A:  At risk severe anoxic brain injury - EEG w/o seizure activity ETOH use - at risk for withdrawal  P:   RASS goal: 0 Consider ativan low dose to avoid wd Prn versed Thiamine IV daily Folic acid IV daily precedex if off levophed   FAMILY  - Updates: i updated fmaily daily  - Inter-disciplinary family meet or Palliative Care meeting due by:  10/18  Ccm time 30 min   Lavon Paganini. Titus Mould, MD, [place] Pgr: [facility]

## 2015-07-11 NOTE — Care Management Important Message (Signed)
Important Message  Patient Details  Name: Mitchell [name] MRN: [id number] Date of Birth: 1951-06-29   Medicare Important Message Given:  Yes-second notification given    Delorse Lek 07/11/2015, 10:53 AM

## 2015-07-11 NOTE — Progress Notes (Signed)
ANTIBIOTIC CONSULT NOTE - FOLLOW UP  Pharmacy Consult for Unasyn Indication: aspiration pna   Patient Measurements: Height: 5\' 8"  (172.7 cm) (Per RN) Weight: 178 lb 2.1 oz (80.8 kg) IBW/kg (Calculated) : 68.4   Vital Signs: Temp: 98.8 F (37.1 C) (10/14 0700) BP: 149/93 mmHg (10/14 1150) Pulse Rate: 119 (10/14 1150) Intake/Output from previous day: 10/13 0701 - 10/14 0700 In: 2410.4 [I.V.:1402.1; NG/GT:708.3; IV Piggyback:300] Out: 500 [Urine:500]   Labs: Scr: 2.41 < 2.1 < 1.7 Estimated CrCl: 25- 30 ml/min  ID:  ABX:  Unasyn 10/11>>   CX data:  MRSA PCR 10/11: negative    Assessment: 44 yoM admitted with respiratory distress and vfib that is on unasyn for suspected asp pna. Extubated 10/14.  Goal of Therapy:  Treatment of infection  Plan:  1. With worsening renal fxn and poor overall UOP will adjust Unasyn to 1.5 mg Q12H 2. F/u clinical response and narrow abx when feasible 3. Continuing to hold heparin at this time.  Vincenza Hews, PharmD, BCPS 07/11/2015, 12:30 PM Pager: [phone]

## 2015-07-11 NOTE — Progress Notes (Signed)
Echocardiogram 2D Echocardiogram with Definity has been performed.  Tresa Res 07/11/2015, 12:00 PM

## 2015-07-12 ENCOUNTER — Inpatient Hospital Stay (HOSPITAL_COMMUNITY): Payer: Medicare Other

## 2015-07-12 DIAGNOSIS — J9601 Acute respiratory failure with hypoxia: Secondary | ICD-10-CM

## 2015-07-12 LAB — BASIC METABOLIC PANEL
Anion gap: 9 (ref 5–15)
BUN: 19 mg/dL (ref 6–20)
CALCIUM: 7.7 mg/dL — AB (ref 8.9–10.3)
CO2: 25 mmol/L (ref 22–32)
CREATININE: 2.09 mg/dL — AB (ref 0.61–1.24)
Chloride: 114 mmol/L — ABNORMAL HIGH (ref 101–111)
GFR calc Af Amer: 37 mL/min — ABNORMAL LOW (ref >60)
GFR calc non Af Amer: 32 mL/min — ABNORMAL LOW (ref >60)
GLUCOSE: 111 mg/dL — AB (ref 65–99)
Potassium: 3.7 mmol/L (ref 3.5–5.1)
Sodium: 148 mmol/L — ABNORMAL HIGH (ref 135–145)

## 2015-07-12 LAB — CBC
HCT: 21.7 % — ABNORMAL LOW (ref 39.0–52.0)
HEMOGLOBIN: 7.5 g/dL — AB (ref 13.0–17.0)
MCH: 31.8 pg (ref 26.0–34.0)
MCHC: 34.6 g/dL (ref 30.0–36.0)
MCV: 91.9 fL (ref 78.0–100.0)
PLATELETS: 68 10*3/uL — AB (ref 150–400)
RBC: 2.36 MIL/uL — ABNORMAL LOW (ref 4.22–5.81)
RDW: 14.9 % (ref 11.5–15.5)
WBC: 5.9 10*3/uL (ref 4.0–10.5)

## 2015-07-12 LAB — GLUCOSE, CAPILLARY
GLUCOSE-CAPILLARY: 107 mg/dL — AB (ref 65–99)
GLUCOSE-CAPILLARY: 108 mg/dL — AB (ref 65–99)
GLUCOSE-CAPILLARY: 109 mg/dL — AB (ref 65–99)
GLUCOSE-CAPILLARY: 86 mg/dL (ref 65–99)
GLUCOSE-CAPILLARY: 94 mg/dL (ref 65–99)
Glucose-Capillary: 106 mg/dL — ABNORMAL HIGH (ref 65–99)
Glucose-Capillary: 105 mg/dL — ABNORMAL HIGH (ref 65–99)

## 2015-07-12 LAB — TRIGLYCERIDES: TRIGLYCERIDES: 61 mg/dL (ref <150)

## 2015-07-12 LAB — MAGNESIUM: Magnesium: 1.9 mg/dL (ref 1.7–2.4)

## 2015-07-12 MED ORDER — FUROSEMIDE 10 MG/ML IJ SOLN
INTRAMUSCULAR | Status: AC
  Filled 2015-07-12: qty 8

## 2015-07-12 MED ORDER — FUROSEMIDE 10 MG/ML IJ SOLN
80.0000 mg | Freq: Once | INTRAMUSCULAR | Status: AC
  Administered 2015-07-12: 80 mg via INTRAVENOUS

## 2015-07-12 MED ORDER — LORAZEPAM 2 MG/ML IJ SOLN
1.0000 mg | INTRAMUSCULAR | Status: DC | PRN
  Administered 2015-07-12 – 2015-07-15 (×4): 2 mg via INTRAVENOUS
  Filled 2015-07-12 (×4): qty 1

## 2015-07-12 MED ORDER — HYDRALAZINE HCL 20 MG/ML IJ SOLN
10.0000 mg | Freq: Three times a day (TID) | INTRAMUSCULAR | Status: DC
  Administered 2015-07-12 – 2015-07-14 (×9): 10 mg via INTRAVENOUS
  Administered 2015-07-15: 20 mg via INTRAVENOUS
  Administered 2015-07-15 – 2015-07-17 (×7): 10 mg via INTRAVENOUS
  Filled 2015-07-12 (×17): qty 1

## 2015-07-12 MED ORDER — ASPIRIN 300 MG RE SUPP
300.0000 mg | Freq: Every day | RECTAL | Status: DC
  Administered 2015-07-12 – 2015-07-18 (×7): 300 mg via RECTAL
  Filled 2015-07-12 (×8): qty 1

## 2015-07-12 MED ORDER — DEXTROSE 5 % IV SOLN
INTRAVENOUS | Status: DC
  Administered 2015-07-12 – 2015-07-17 (×4): via INTRAVENOUS

## 2015-07-12 MED ORDER — ANTISEPTIC ORAL RINSE SOLUTION (CORINZ)
7.0000 mL | OROMUCOSAL | Status: DC
  Administered 2015-07-12 – 2015-07-18 (×36): 7 mL via OROMUCOSAL

## 2015-07-12 MED ORDER — HEPARIN SODIUM (PORCINE) 5000 UNIT/ML IJ SOLN
5000.0000 [IU] | Freq: Three times a day (TID) | INTRAMUSCULAR | Status: DC
  Filled 2015-07-12: qty 1

## 2015-07-12 NOTE — Progress Notes (Signed)
Spoke with Dr. Elsworth Soho regarding SQ heparin since pt's platelet level 68 and hgb 7.5.  Orders received to d/c heparin.  Pt has SCDs.  Will continue to monitor.

## 2015-07-12 NOTE — Progress Notes (Addendum)
Subjective:  Extubated and he is able to recognize his family members, verbally responsive yes and no with difficulty. Wife and daughter present the bedside. Able to squeeze hands only. No other purposeful movements. Objective:  Vital Signs in the last 24 hours: Temp:  [96.6 F (35.9 C)-99 F (37.2 C)] 98.6 F (37 C) (10/15 0900) Pulse Rate:  [88-127] 104 (10/15 0900) Resp:  [16-40] 33 (10/15 0900) BP: (118-165)/(65-105) 154/86 mmHg (10/15 0900) SpO2:  [88 %-100 %] 100 % (10/15 0900) Weight:  [81.4 kg (179 lb 7.3 oz)] 81.4 kg (179 lb 7.3 oz) (10/15 0300)  Intake/Output from previous day: 10/14 0701 - 10/15 0700 In: 1487.7 [I.V.:1372.7; NG/GT:65; IV Piggyback:50] Out: 465 [Urine:465]  Physical Exam: General appearance: alert, no distress and slowed mentation Eyes: Follow movement with eyes purposefully Neck: no carotid bruit, supple, symmetrical, trachea midline and not able to completely evaluate due to ET tube and central lines Resp: clear to auscultation bilaterally Cardio: regular rate and rhythm, S1, S2 normal, no murmur, click, rub or gallop and distant heart sounds GI: soft, non-tender; bowel sounds normal; no masses,  no organomegaly Extremities: No signs of arterial insufficiency, 2+ DP pulse. No edema.  Lab Results: BMP  Recent Labs  07/10/15 07/10/15 0346 07/11/15 0337  NA 142 143 140  K 4.2 4.4 3.9  CL 113* 112* 113*  CO2 18* 17* 19*  GLUCOSE 80 81 112*  BUN 9 10 16   CREATININE 1.72* 2.10* 2.41*  CALCIUM 7.3* 7.4* 7.0*  GFRNONAA 40* 31* 27*  GFRAA 46* 36* 31*    CBC  Recent Labs Lab 07/11/15 0337 07/12/15 0415  WBC 10.0 5.9  RBC 2.84* 2.36*  HGB 9.0* 7.5*  HCT 26.2* 21.7*  PLT 72* 68*  MCV 92.3 91.9  MCH 31.7 31.8  MCHC 34.4 34.6  RDW 14.7 14.9  LYMPHSABS 0.3*  --   MONOABS 0.4  --   EOSABS 0.0  --   BASOSABS 0.0  --     HEMOGLOBIN A1C No results found for: HGBA1C, MPG  Cardiac Panel (last 3 results)  Recent Labs  07/08/15 0622  07/08/15 1250 07/09/15 1500  TROPONINI <0.03 0.37* 0.25*    Recent Labs  07/08/15 0622 07/10/15 0346 07/11/15 0337  PROT 6.2* 6.1* 5.5*  ALBUMIN 2.6* 2.3* 1.9*  AST 229* 62* 35  ALT 34 29 21  ALKPHOS 99 81 64  BILITOT 0.7 0.4 0.6  BILIDIR  --  0.2  --   IBILI  --  0.2*  --    Cardiac Studies: EKG 07/09/2015: Normal sinus rhythm at rate of 64 bpm, prolonged routine trauma, T wave inversion, cannot exclude inferior ischemia.  Abnormal EKG.  Compared to EKG 07/08/2015: Bronchus QT interval is new.  Sinus tachycardia no longer present. Echocardiogram 07/11/2015: Marked decrease in LV systolic function, EF 0000000, no significant valvular abnormality. Poor echo window. Global hypokinesis.  Scheduled Meds: . ampicillin-sulbactam (UNASYN) IV  1.5 g Intravenous Q12H  . antiseptic oral rinse  7 mL Mouth Rinse 6 times per day  . aspirin  325 mg Per Tube Daily  . chlorhexidine gluconate  15 mL Mouth Rinse BID  . fentaNYL (SUBLIMAZE) injection  50 mcg Intravenous Once  . folic acid  1 mg Intravenous Daily  . hydrocortisone sodium succinate  50 mg Intravenous Q6H  . Influenza vac split quadrivalent PF  0.5 mL Intramuscular Tomorrow-1000  . LORazepam  1 mg Intravenous 3 times per day  . metoprolol  5 mg Intravenous 4 times  per day  . multivitamin  5 mL Per Tube Daily  . pantoprazole (PROTONIX) IV  40 mg Intravenous Q24H  . sodium chloride  10-40 mL Intracatheter Q12H  . thiamine IV  100 mg Intravenous Daily   Continuous Infusions: . sodium chloride 10 mL/hr at 07/11/15 0800  . dextrose 5 % and 0.45% NaCl 900 mL with sodium bicarbonate 100 mEq infusion 50 mL/hr at 07/11/15 0800  . norepinephrine (LEVOPHED) Adult infusion Stopped (07/11/15 1100)   PRN Meds:.fentaNYL, LORazepam, sodium chloride   Assessment/Plan:  1.  Cardiac arrest, probably precipitated by cardiomyopathy, cannot exclude alcoholic cardiomyopathy, although the left ventricle is not dilated hence underlying coronary  artery disease cannot be completely excluded as an etiology. Acute shock could also lead to this presentation. 2.  Abnormal EKG 3.  Anoxic brain injury 4. History of heavy on call use, patient has been drinking 90 proof half gallon of liquor every 2-3 days. 5. Acute on chronic renal failure. 6. History of DVT in the past, family members state that he has family history also of DVTs and blood clots, was on Xarelto in the outpatient basis. Presently heparin has been turned off due to tracheal injury and bleed, consider DVT prophylaxis.    Recommendation: Patient is presently on beta blockers, blood pressure remains elevated, he can certainly tolerate higher doses of beta blocker, I will also add hydralazine for blood pressure control and also for cardiomyopathy. ACE inhibitors contraindicated due to acute renal failure. He may eventually need coronary angiography, however this depends on his recovery, neurologic. After long discussion with the patient's wife and patient's daughter at the bedside and explained the critical nature and long-term poor prognosis at this point, they all agree that he should be DO NOT RESUSCITATE. D/W Dr. Elsworth Soho. Please call if questions. No cardiac w/u for now. Consider repeat echo in 1-2 weeks.  He will need DVT prophylaxis.  Adrian Prows, MD 07/12/2015, 11:27 AM [facility]. [place] Pager: [phone] Office: [phone] If no answer: Cell:  [phone]

## 2015-07-12 NOTE — Progress Notes (Signed)
PULMONARY / CRITICAL CARE MEDICINE   Name: Mitchell [name] MRN: [id number] DOB: December 10, 1950    ADMISSION DATE:  07/08/2015   REFERRING MD :  EDP  CHIEF COMPLAINT:  S/P Arrest  INITIAL PRESENTATION: 64 yo AAM with incomplete pmh but known HTN, Gout, Hyperlipidemia, ETOH abuse who was noted to have respiratory distress and odd resp sounds per wife 1 min after laying back down early am 10/11 and EMS was activated. Wife attempted cpr at home. He was down for approximately 10 minutes AND when EMS he was in V fib. Shocked x 1 and epi x1 with return of circulation. Intubated and transported to [facility] ED.   STUDIES:  10/11 ct head - atrophy mild, neg acute 10/11 ct angio chest - There is extensive air in the neck and upper mediastinal region. Air is noted just anterior to the trachea in the lower neck region 10/11 EEG - attenuated background rhythm. No seizure activity.  SIGNIFICANT EVENTS: 10/11 - Admitted post cardiac arrest & cooling initiated 10/12 - Rewarming 10/13- awake, followed commands, normothermia stopped for severe shivering  10/14 echo - EF 20%  SUBJECTIVE:  more awake Denies pain Coughing, needs NT suction   VITAL SIGNS: Temp:  [96.6 F (35.9 C)-99 F (37.2 C)] 98.6 F (37 C) (10/15 0900) Pulse Rate:  [88-127] 104 (10/15 0900) Resp:  [16-40] 33 (10/15 0900) BP: (118-165)/(65-105) 154/86 mmHg (10/15 0900) SpO2:  [88 %-100 %] 100 % (10/15 0900) Weight:  [81.4 kg (179 lb 7.3 oz)] 81.4 kg (179 lb 7.3 oz) (10/15 0300) HEMODYNAMICS: CVP:  [3 mmHg-10 mmHg] 10 mmHg VENTILATOR SETTINGS:   INTAKE / OUTPUT:  Intake/Output Summary (Last 24 hours) at 07/12/15 1109 Last data filed at 07/12/15 0900  Gross per 24 hour  Intake   1350 ml  Output    965 ml  Net    385 ml    PHYSICAL EXAMINATION: General: awake, mild agitation HEENT: no change in min crepitus Cardiovascular:  Regular rate. No edema. No appreciable JVD.  Pulmonary:  Decreased BL,  Abdomen: Soft. Bowel sounds  present. Nondistended.  Neurological: awake, eyes open,  Follows 1 step  commands   LABS:  CBC  Recent Labs Lab 07/10/15 1229 07/11/15 0337 07/12/15 0415  WBC 13.9* 10.0 5.9  HGB 10.1* 9.0* 7.5*  HCT 28.8* 26.2* 21.7*  PLT 97* 72* 68*   Coag's  Recent Labs Lab 07/08/15 0622 07/08/15 1420  APTT 34  --   INR 1.25 1.32   BMET  Recent Labs Lab 07/10/15 07/10/15 0346 07/11/15 0337  NA 142 143 140  K 4.2 4.4 3.9  CL 113* 112* 113*  CO2 18* 17* 19*  BUN 9 10 16   CREATININE 1.72* 2.10* 2.41*  GLUCOSE 80 81 112*   Electrolytes  Recent Labs Lab 07/10/15 07/10/15 0346 07/11/15 0337 07/12/15 0415  CALCIUM 7.3* 7.4* 7.0*  --   MG  --  2.3 2.0 1.9  PHOS  --  3.9  --   --    Sepsis Markers  Recent Labs Lab 07/08/15 0628 07/09/15 1137 07/09/15 1730  LATICACIDVEN 6.40* 1.4 1.3   ABG  Recent Labs Lab 07/08/15 1130 07/10/15 0845 07/11/15 0341  PHART 7.397 7.345* 7.427  PCO2ART 20.4* 28.8* 27.8*  PO2ART 218* 137* 211*   Liver Enzymes  Recent Labs Lab 07/08/15 0622 07/10/15 0346 07/11/15 0337  AST 229* 62* 35  ALT 34 29 21  ALKPHOS 99 81 64  BILITOT 0.7 0.4 0.6  ALBUMIN 2.6* 2.3* 1.9*  Cardiac Enzymes  Recent Labs Lab 07/08/15 0622 07/08/15 1250 07/09/15 1500  TROPONINI <0.03 0.37* 0.25*   Glucose  Recent Labs Lab 07/11/15 1355 07/11/15 1635 07/11/15 1936 07/11/15 2346 07/12/15 0413 07/12/15 0745  GLUCAP 83 87 100* 94 108* 109*    Imaging Dg Chest Port 1 View  07/12/2015  CLINICAL DATA:  64 year old male with pulmonary edema. EXAM: PORTABLE CHEST 1 VIEW COMPARISON:  Chest x-ray 07/11/2015. FINDINGS: Patient has been extubated. Nasogastric tube has been removed. Left internal jugular central venous catheter with tip terminating in the mid superior vena cava. Lung volumes are slightly low. Worsening airspace consolidation throughout the lungs bilaterally (asymmetrically distributed), most severe in the right mid to lower lung. No  definite pleural effusions. Cephalization of the pulmonary vasculature. Heart size is normal. The patient is rotated to the left on today's exam, resulting in distortion of the mediastinal contours and reduced diagnostic sensitivity and specificity for mediastinal pathology. IMPRESSION: 1. Support apparatus, as above. 2. Worsening asymmetrically distributed airspace consolidation in the lungs bilaterally, most severe in the right lower lobe, concerning for developing multilobar pneumonia, potentially sequela of recent aspiration. 3. Given the cephalization of pulmonary vasculature, some of these findings could be related to developing pulmonary edema, however, this is not favored given the normal cardiac size. Electronically Signed   By: Vinnie Langton M.D.   On: 07/12/2015 08:03     ASSESSMENT / PLAN:  PULMONARY OETT 10/11>>10/14 A: Acute Respiratory Failure Post Arrest Tracheal tear (ill assume traumatic intubation)- mild blood from ETT 10/15 Worsening infx  - acute pulmonary edema vs aspirated blood P:    NT suction prn   CARDIOVASCULAR CVL ij 10/11>>> A:  Post V fib arrest S/p Hypothermia protocol H/o DVT Concern ischemia as cause  P:  Cardiology following Rpt echo in 1 wk for recovery   RENAL A:  Acute Renal Failure - ATN post arrest Lactic acidosis NONAG P:  Dc bicarb gtt May need renal US Lasix 80 x 1  GASTROINTESTINAL A:  NPO  P:   Protonix IV daily   HEMATOLOGIC A:   Anemia - blood loss from tracheal tear Thrombocytopenia (ETOH, dilution)  P:  scd Heparin SQ  INFECTIOUS A:   Tracheal tear No fevers P:   Unasyn 10/11>>   ENDOCRINE A:  At risk hyperglcyemia with hypothermia - controlled BOX empirically treated for concern rel AI P:   Accuchecks q1hr Dc Stress roids  NEUROLOGIC A:  At risk severe anoxic brain injury - EEG w/o seizure activity ETOH use - at risk for withdrawal  P:    ativan low dose to avoid wd Thiamine IV  daily Folic acid IV daily    FAMILY  - Updates:wife shirley & daughter , confirmed DNR, full medical care  - Inter-disciplinary family meet or Palliative Care meeting due by:  10/18  The patient is critically ill with multiple organ systems failure and requires high complexity decision making for assessment and support, frequent evaluation and titration of therapies, application of advanced monitoring technologies and extensive interpretation of multiple databases. Critical Care Time devoted to patient care services described in this note independent of APP time is 32 minutes.   Kara Mead MD. [place] Flood. [place] [facility] Pager [phone] If no response call [phone]

## 2015-07-12 NOTE — Progress Notes (Signed)
SLP Cancellation Note  Patient Details Name: Alhaji Ostergren MRN: [id number] DOB: Jun 08, 1951   Cancelled treatment:        Checked pt this morning and at 1320 spoke with nurse who said to defer due to lethargy and difficulty mobilizing secretions with NTS this am. Will continue efforts.   Houston Siren 07/12/2015, 3:20 PM  Orbie Pyo Colvin Caroli.Ed [employer] [phone]

## 2015-07-13 ENCOUNTER — Inpatient Hospital Stay (HOSPITAL_COMMUNITY): Payer: Medicare Other

## 2015-07-13 DIAGNOSIS — I429 Cardiomyopathy, unspecified: Secondary | ICD-10-CM

## 2015-07-13 LAB — GLUCOSE, CAPILLARY
GLUCOSE-CAPILLARY: 70 mg/dL (ref 65–99)
GLUCOSE-CAPILLARY: 70 mg/dL (ref 65–99)
Glucose-Capillary: 76 mg/dL (ref 65–99)
Glucose-Capillary: 77 mg/dL (ref 65–99)
Glucose-Capillary: 62 mg/dL — ABNORMAL LOW (ref 65–99)
Glucose-Capillary: 64 mg/dL — ABNORMAL LOW (ref 65–99)
Glucose-Capillary: 78 mg/dL (ref 65–99)

## 2015-07-13 LAB — BASIC METABOLIC PANEL
Anion gap: 10 (ref 5–15)
BUN: 24 mg/dL — AB (ref 6–20)
CHLORIDE: 110 mmol/L (ref 101–111)
CO2: 25 mmol/L (ref 22–32)
CREATININE: 2.12 mg/dL — AB (ref 0.61–1.24)
Calcium: 7.6 mg/dL — ABNORMAL LOW (ref 8.9–10.3)
GFR calc Af Amer: 36 mL/min — ABNORMAL LOW (ref >60)
GFR calc non Af Amer: 31 mL/min — ABNORMAL LOW (ref >60)
Glucose, Bld: 84 mg/dL (ref 65–99)
Potassium: 3.3 mmol/L — ABNORMAL LOW (ref 3.5–5.1)
Sodium: 145 mmol/L (ref 135–145)

## 2015-07-13 LAB — CBC
HEMATOCRIT: 23.1 % — AB (ref 39.0–52.0)
Hemoglobin: 7.8 g/dL — ABNORMAL LOW (ref 13.0–17.0)
MCH: 30.8 pg (ref 26.0–34.0)
MCHC: 33.8 g/dL (ref 30.0–36.0)
MCV: 91.3 fL (ref 78.0–100.0)
PLATELETS: 92 10*3/uL — AB (ref 150–400)
RBC: 2.53 MIL/uL — ABNORMAL LOW (ref 4.22–5.81)
RDW: 14.8 % (ref 11.5–15.5)
WBC: 4.9 10*3/uL (ref 4.0–10.5)

## 2015-07-13 LAB — MAGNESIUM: Magnesium: 1.8 mg/dL (ref 1.7–2.4)

## 2015-07-13 MED ORDER — MAGNESIUM SULFATE 2 GM/50ML IV SOLN
2.0000 g | Freq: Once | INTRAVENOUS | Status: AC
  Administered 2015-07-13: 2 g via INTRAVENOUS
  Filled 2015-07-13: qty 50

## 2015-07-13 MED ORDER — DEXTROSE 50 % IV SOLN
25.0000 mL | Freq: Once | INTRAVENOUS | Status: AC
  Administered 2015-07-13: 25 mL via INTRAVENOUS

## 2015-07-13 MED ORDER — DEXTROSE 5 % IV SOLN
1.0000 g | INTRAVENOUS | Status: AC
  Administered 2015-07-13 – 2015-07-16 (×4): 1 g via INTRAVENOUS
  Filled 2015-07-13 (×4): qty 10

## 2015-07-13 MED ORDER — DEXTROSE 50 % IV SOLN
INTRAVENOUS | Status: AC
  Filled 2015-07-13: qty 50

## 2015-07-13 MED ORDER — POTASSIUM CHLORIDE 10 MEQ/50ML IV SOLN
10.0000 meq | INTRAVENOUS | Status: AC
  Administered 2015-07-13 (×3): 10 meq via INTRAVENOUS
  Filled 2015-07-13 (×3): qty 50

## 2015-07-13 MED ORDER — FUROSEMIDE 10 MG/ML IJ SOLN
80.0000 mg | Freq: Once | INTRAMUSCULAR | Status: AC
  Administered 2015-07-13: 80 mg via INTRAVENOUS
  Filled 2015-07-13: qty 8

## 2015-07-13 NOTE — Progress Notes (Addendum)
SLP Cancellation Note  Patient Details Name: Mitchell [name] MRN: [id number] DOB: 08/11/1951   Cancelled treatment:       Reason Eval/Treat Not Completed: Medical issues which prohibited therapy. RN reports continued lethargy and poor secretion management. ST to continue to monitor for PO readiness  Arvil Chaco MA, [place], [place] 07/13/2015, 7:45 AM

## 2015-07-13 NOTE — Progress Notes (Signed)
PULMONARY / CRITICAL CARE MEDICINE   Name: Mitchell [name] MRN: [id number] DOB: 12/21/1950    ADMISSION DATE:  07/08/2015   REFERRING MD :  EDP  CHIEF COMPLAINT:  S/P Arrest  INITIAL PRESENTATION: 64 yo AAM with incomplete pmh but known HTN, Gout, Hyperlipidemia, ETOH abuse who was noted to have respiratory distress and odd resp sounds per wife 1 min after laying back down early am 10/11 and EMS was activated. Wife attempted cpr at home. He was down for approximately 10 minutes AND when EMS he was in V fib. Shocked x 1 and epi x1 with return of circulation. Intubated and transported to [facility] ED.   STUDIES:  10/11 ct head - atrophy mild, neg acute 10/11 ct angio chest - There is extensive air in the neck and upper mediastinal region. Air is noted just anterior to the trachea in the lower neck region 10/11 EEG - attenuated background rhythm. No seizure activity.  SIGNIFICANT EVENTS: 10/11 - Admitted post cardiac arrest & cooling initiated 10/12 - Rewarming 10/13- awake, followed commands, normothermia stopped for severe shivering  10/14 echo - EF 20%  SUBJECTIVE:  Frequently needs NTS suction. Slightly more awake.  Does not follow commands.    VITAL SIGNS: Temp:  [97.9 F (36.6 C)-99.3 F (37.4 C)] 98.6 F (37 C) (10/16 0700) Pulse Rate:  [85-112] 94 (10/16 0700) Resp:  [17-39] 18 (10/16 0700) BP: (115-156)/(68-102) 140/85 mmHg (10/16 0600) SpO2:  [92 %-100 %] 100 % (10/16 0700) Weight:  [177 lb 7.5 oz (80.5 kg)] 177 lb 7.5 oz (80.5 kg) (10/16 0315) HEMODYNAMICS: CVP:  [8 mmHg] 8 mmHg VENTILATOR SETTINGS:   INTAKE / OUTPUT:  Intake/Output Summary (Last 24 hours) at 07/13/15 0928 Last data filed at 07/13/15 0800  Gross per 24 hour  Intake 1021.5 ml  Output    868 ml  Net  153.5 ml    PHYSICAL EXAMINATION: General: awake, NAD HEENT: no change in min crepitus, copious upper airway secretions  Cardiovascular:  Regular rate. No edema. No appreciable JVD.  Pulmonary:   resps even non labored on [place], coarse, bibasilar crackles   Abdomen: Soft. Bowel sounds present. Nondistended.  Neurological: slow to wake, opens eyes, tracks, does not follow commands, significant gen weakness   LABS:  CBC  Recent Labs Lab 07/11/15 0337 07/12/15 0415 07/13/15 0456  WBC 10.0 5.9 4.9  HGB 9.0* 7.5* 7.8*  HCT 26.2* 21.7* 23.1*  PLT 72* 68* 92*   Coag's  Recent Labs Lab 07/08/15 0622 07/08/15 1420  APTT 34  --   INR 1.25 1.32   BMET  Recent Labs Lab 07/11/15 0337 07/12/15 1257 07/13/15 0456  NA 140 148* 145  K 3.9 3.7 3.3*  CL 113* 114* 110  CO2 19* 25 25  BUN 16 19 24*  CREATININE 2.41* 2.09* 2.12*  GLUCOSE 112* 111* 84   Electrolytes  Recent Labs Lab 07/10/15 0346 07/11/15 0337 07/12/15 0415 07/12/15 1257 07/13/15 0456  CALCIUM 7.4* 7.0*  --  7.7* 7.6*  MG 2.3 2.0 1.9  --  1.8  PHOS 3.9  --   --   --   --    Sepsis Markers  Recent Labs Lab 07/08/15 0628 07/09/15 1137 07/09/15 1730  LATICACIDVEN 6.40* 1.4 1.3   ABG  Recent Labs Lab 07/08/15 1130 07/10/15 0845 07/11/15 0341  PHART 7.397 7.345* 7.427  PCO2ART 20.4* 28.8* 27.8*  PO2ART 218* 137* 211*   Liver Enzymes  Recent Labs Lab 07/08/15 0622 07/10/15 0346 07/11/15 [id number]  AST 229* 62* 35  ALT 34 29 21  ALKPHOS 99 81 64  BILITOT 0.7 0.4 0.6  ALBUMIN 2.6* 2.3* 1.9*   Cardiac Enzymes  Recent Labs Lab 07/08/15 0622 07/08/15 1250 07/09/15 1500  TROPONINI <0.03 0.37* 0.25*   Glucose  Recent Labs Lab 07/12/15 1229 07/12/15 1551 07/12/15 2011 07/12/15 2342 07/13/15 0430 07/13/15 0732  GLUCAP 107* 106* 105* 86 76 77    Imaging Dg Chest Port 1 View  07/13/2015  CLINICAL DATA:  Acute respiratory failure with hypoxemia EXAM: PORTABLE CHEST 1 VIEW COMPARISON:  1 day prior FINDINGS: Left internal jugular line tip at mid SVC. Midline trachea. Upper normal heart size. Probable layering bilateral pleural effusions. No pneumothorax. Relatively diffuse  interstitial and airspace disease. Slightly progressive, especially on the left. IMPRESSION: Slight worsening aeration. At least partially felt to be due to pulmonary edema. Multifocal infection or aspiration cannot be excluded. Probable layering bilateral pleural effusions. Electronically Signed   By: Abigail Miyamoto M.D.   On: 07/13/2015 07:59     ASSESSMENT / PLAN:  PULMONARY OETT 10/11>>10/14 A: Acute Respiratory Failure Post Arrest Tracheal tear (ill assume traumatic intubation)- mild blood from ETT 10/15 Worsening infx  - acute pulmonary edema vs aspirated blood P:   NT suction prn Mobilize as able  F/u CXR  Diuresis as below  Flutter as able  Empiric abx    CARDIOVASCULAR CVL ij 10/11>>> A:  Post V fib arrest S/p Hypothermia protocol H/o DVT Concern ischemia as cause  P:  Cardiology following Rpt echo in 1 wk for recovery Metoprolol, hydralazine per cards  Consider further w/u if neuro recovery    RENAL A:  Acute Renal Failure - ATN post arrest Lactic acidosis hypoMg  P:  May need renal US Lasix 80 x 1 Replete K  Replete Mg  F/u chem, mg, phos   GASTROINTESTINAL A:  NPO P:   Protonix IV daily Swallow eval pending  If fails swallow will need TF    HEMATOLOGIC A:   Anemia - blood loss from tracheal tear Thrombocytopenia (ETOH, dilution)  P:  scd Off heparin   INFECTIOUS A:   Tracheal tear No fevers P:   Unasyn 10/11>>10/16 Rocephin 10/16>>>  Continue empiric coverage for ?pneumonia - change to rocephin per pharmacy recs given large amt sodium in unasyn    ENDOCRINE A:  At risk hyperglcyemia with hypothermia - controlled P:   F/u glucose on chem   NEUROLOGIC A:  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  Have palliative  care meeting scheduled 10/17  - Inter-disciplinary family meet or Palliative Care meeting due by:  10/18  Nickolas Madrid, NP 07/13/2015  9:28 AM Pager: [phone] or [phone]

## 2015-07-14 ENCOUNTER — Inpatient Hospital Stay (HOSPITAL_COMMUNITY): Payer: Medicare Other

## 2015-07-14 DIAGNOSIS — G931 Anoxic brain damage, not elsewhere classified: Secondary | ICD-10-CM

## 2015-07-14 LAB — BASIC METABOLIC PANEL
ANION GAP: 13 (ref 5–15)
Anion gap: 10 (ref 5–15)
BUN: 23 mg/dL — AB (ref 6–20)
BUN: 22 mg/dL — ABNORMAL HIGH (ref 6–20)
CHLORIDE: 108 mmol/L (ref 101–111)
CHLORIDE: 112 mmol/L — AB (ref 101–111)
CO2: 25 mmol/L (ref 22–32)
CO2: 24 mmol/L (ref 22–32)
CREATININE: 1.91 mg/dL — AB (ref 0.61–1.24)
Calcium: 8.1 mg/dL — ABNORMAL LOW (ref 8.9–10.3)
Calcium: 8.8 mg/dL — ABNORMAL LOW (ref 8.9–10.3)
Creatinine, Ser: 1.83 mg/dL — ABNORMAL HIGH (ref 0.61–1.24)
GFR calc Af Amer: 41 mL/min — ABNORMAL LOW (ref >60)
GFR calc non Af Amer: 35 mL/min — ABNORMAL LOW (ref >60)
GFR calc non Af Amer: 37 mL/min — ABNORMAL LOW (ref >60)
GFR, EST AFRICAN AMERICAN: 43 mL/min — AB (ref >60)
GLUCOSE: 80 mg/dL (ref 65–99)
Glucose, Bld: 83 mg/dL (ref 65–99)
POTASSIUM: 4.2 mmol/L (ref 3.5–5.1)
Potassium: 3.1 mmol/L — ABNORMAL LOW (ref 3.5–5.1)
SODIUM: 149 mmol/L — AB (ref 135–145)
Sodium: 143 mmol/L (ref 135–145)

## 2015-07-14 LAB — GLUCOSE, CAPILLARY
GLUCOSE-CAPILLARY: 79 mg/dL (ref 65–99)
GLUCOSE-CAPILLARY: 73 mg/dL (ref 65–99)
Glucose-Capillary: 67 mg/dL (ref 65–99)
Glucose-Capillary: 91 mg/dL (ref 65–99)
Glucose-Capillary: 78 mg/dL (ref 65–99)

## 2015-07-14 LAB — CBC
HEMATOCRIT: 23.8 % — AB (ref 39.0–52.0)
Hemoglobin: 8.1 g/dL — ABNORMAL LOW (ref 13.0–17.0)
MCH: 31 pg (ref 26.0–34.0)
MCHC: 34 g/dL (ref 30.0–36.0)
MCV: 91.2 fL (ref 78.0–100.0)
Platelets: 87 10*3/uL — ABNORMAL LOW (ref 150–400)
RBC: 2.61 MIL/uL — ABNORMAL LOW (ref 4.22–5.81)
RDW: 14.8 % (ref 11.5–15.5)
WBC: 4.4 10*3/uL (ref 4.0–10.5)

## 2015-07-14 LAB — MAGNESIUM: Magnesium: 2 mg/dL (ref 1.7–2.4)

## 2015-07-14 MED ORDER — POTASSIUM CHLORIDE 20 MEQ/15ML (10%) PO SOLN
40.0000 meq | Freq: Three times a day (TID) | ORAL | Status: AC
  Administered 2015-07-14 (×2): 40 meq
  Filled 2015-07-14 (×3): qty 30

## 2015-07-14 MED ORDER — METOPROLOL TARTRATE 25 MG/10 ML ORAL SUSPENSION
25.0000 mg | Freq: Two times a day (BID) | ORAL | Status: DC
  Administered 2015-07-14 (×2): 25 mg
  Filled 2015-07-14 (×3): qty 10

## 2015-07-14 MED ORDER — VITAL AF 1.2 CAL PO LIQD
1000.0000 mL | ORAL | Status: DC
  Filled 2015-07-14 (×2): qty 1000

## 2015-07-14 MED ORDER — DEXTROSE 50 % IV SOLN
25.0000 mL | Freq: Once | INTRAVENOUS | Status: AC
  Administered 2015-07-14: 25 mL via INTRAVENOUS
  Filled 2015-07-14: qty 50

## 2015-07-14 MED ORDER — METOPROLOL TARTRATE 1 MG/ML IV SOLN: 2.5000 mg | INTRAVENOUS | Status: DC | PRN

## 2015-07-14 MED ORDER — POTASSIUM CHLORIDE 10 MEQ/50ML IV SOLN
10.0000 meq | INTRAVENOUS | Status: AC
  Administered 2015-07-14 (×6): 10 meq via INTRAVENOUS
  Filled 2015-07-14 (×6): qty 50

## 2015-07-14 MED ORDER — VITAL AF 1.2 CAL PO LIQD
1000.0000 mL | ORAL | Status: DC
  Administered 2015-07-14: 1000 mL
  Filled 2015-07-14 (×8): qty 1000

## 2015-07-14 MED ORDER — FUROSEMIDE 10 MG/ML IJ SOLN
20.0000 mg | Freq: Four times a day (QID) | INTRAMUSCULAR | Status: AC
  Administered 2015-07-14 (×3): 20 mg via INTRAVENOUS
  Filled 2015-07-14 (×3): qty 2

## 2015-07-14 NOTE — Progress Notes (Signed)
PULMONARY / CRITICAL CARE MEDICINE   Name: Mitchell [name] MRN: [id number] DOB: Nov 13, 1950    ADMISSION DATE:  07/08/2015   REFERRING MD :  EDP  CHIEF COMPLAINT:  S/P Arrest  INITIAL PRESENTATION: 64 yo AAM with incomplete pmh but known HTN, Gout, Hyperlipidemia, ETOH abuse who was noted to have respiratory distress and odd resp sounds per wife 1 min after laying back down early am 10/11 and EMS was activated. Wife attempted cpr at home. He was down for approximately 10 minutes AND when EMS he was in V fib. Shocked x 1 and epi x1 with return of circulation. Intubated and transported to [facility] ED.   STUDIES:  10/11 ct head - atrophy mild, neg acute 10/11 ct angio chest - There is extensive air in the neck and upper mediastinal region. Air is noted just anterior to the trachea in the lower neck region 10/11 EEG - attenuated background rhythm. No seizure activity.  SIGNIFICANT EVENTS: 10/11 - Admitted post cardiac arrest & cooling initiated 10/12 - Rewarming 10/13- awake, followed commands, normothermia stopped for severe shivering  10/14 echo - EF 20%  SUBJECTIVE:  Not following commands, airway protection questionable at best, continues to require NTS.  VITAL SIGNS: Temp:  [98.6 F (37 C)-100.6 F (38.1 C)] 100.6 F (38.1 C) (10/17 0800) Pulse Rate:  [88-112] 110 (10/17 0800) Resp:  [15-53] 24 (10/17 0800) BP: (129-163)/(76-125) 163/83 mmHg (10/17 0600) SpO2:  [95 %-100 %] 100 % (10/17 0800) Weight:  [80.6 kg (177 lb 11.1 oz)] 80.6 kg (177 lb 11.1 oz) (10/17 0500)  HEMODYNAMICS: CVP:  [5 mmHg] 5 mmHg  VENTILATOR SETTINGS:    INTAKE / OUTPUT:  Intake/Output Summary (Last 24 hours) at 07/14/15 0932 Last data filed at 07/14/15 0900  Gross per 24 hour  Intake   1370 ml  Output   1695 ml  Net   -325 ml   PHYSICAL EXAMINATION: General: Not following commands, shakes head in bed. HEENT: No change in min crepitus, copious upper airway secretions  Cardiovascular:  Regular  rate. No edema. No appreciable JVD.  Pulmonary:  resps even non labored on [place], coarse, bibasilar crackles   Abdomen: Soft. Bowel sounds present. Nondistended.  Neurological: Moves head back and forth and moves all ext spontaneously but nothing to command.  LABS:   CBC  Recent Labs Lab 07/12/15 0415 07/13/15 0456 07/14/15 0350  WBC 5.9 4.9 4.4  HGB 7.5* 7.8* 8.1*  HCT 21.7* 23.1* 23.8*  PLT 68* 92* 87*   Coag's  Recent Labs Lab 07/08/15 0622 07/08/15 1420  APTT 34  --   INR 1.25 1.32   BMET  Recent Labs Lab 07/12/15 1257 07/13/15 0456 07/14/15 0350  NA 148* 145 143  K 3.7 3.3* 3.1*  CL 114* 110 108  CO2 25 25 25   BUN 19 24* 23*  CREATININE 2.09* 2.12* 1.91*  GLUCOSE 111* 84 80   Electrolytes  Recent Labs Lab 07/10/15 0346  07/12/15 0415 07/12/15 1257 07/13/15 0456 07/14/15 0350  CALCIUM 7.4*  < >  --  7.7* 7.6* 8.1*  MG 2.3  < > 1.9  --  1.8 2.0  PHOS 3.9  --   --   --   --   --   < > = values in this interval not displayed. Sepsis Markers  Recent Labs Lab 07/08/15 0628 07/09/15 1137 07/09/15 1730  LATICACIDVEN 6.40* 1.4 1.3   ABG  Recent Labs Lab 07/08/15 1130 07/10/15 0845 07/11/15 0341  PHART 7.397  7.345* 7.427  PCO2ART 20.4* 28.8* 27.8*  PO2ART 218* 137* 211*   Liver Enzymes  Recent Labs Lab 07/08/15 0622 07/10/15 0346 07/11/15 0337  AST 229* 62* 35  ALT 34 29 21  ALKPHOS 99 81 64  BILITOT 0.7 0.4 0.6  ALBUMIN 2.6* 2.3* 1.9*   Cardiac Enzymes  Recent Labs Lab 07/08/15 0622 07/08/15 1250 07/09/15 1500  TROPONINI <0.03 0.37* 0.25*   Glucose  Recent Labs Lab 07/13/15 2039 07/13/15 2106 07/14/15 0025 07/14/15 0128 07/14/15 0353 07/14/15 0737  GLUCAP 64* 78 67 91 78 79    Imaging No results found.   ASSESSMENT / PLAN:  PULMONARY OETT 10/11>>10/14 A: Acute Respiratory Failure Post Arrest Tracheal tear (ill assume traumatic intubation)- mild blood from ETT 10/15 Worsening infx  - acute pulmonary  edema vs aspirated blood P:   NT suction prn Mobilize as able  F/u CXR  Diuresis as below  Flutter as able  Empiric abx  Monitor closely for airway protection. If deteriorates then will transition to comfort care.  CARDIOVASCULAR CVL ij 10/11>>> A:  Post V fib arrest S/p Hypothermia protocol H/o DVT Concern ischemia as cause  P:  Cardiology following Rpt echo in 1 wk for recovery Metoprolol PO scheduled, hydralazine IV per cards  Consider further w/u if neuro recovery  Lopressor IV PRN  RENAL A:  Acute Renal Failure - ATN post arrest Lactic acidosis hypoMg  P:  May need renal US if deteriorates. Lasix 20 mg IV q6 hours x3 doses Replete K IV and PO. F/u chem, mg, phos   GASTROINTESTINAL A:  NPO P:   Protonix IV daily Insert panda Restart TF May need PEG.  HEMATOLOGIC A:   Anemia - blood loss from tracheal tear Thrombocytopenia (ETOH, dilution)  P:  scd Off heparin   INFECTIOUS A:   Tracheal tear No fevers P:   Unasyn 10/11>>10/16 Rocephin 10/16>>>  Continue empiric coverage for ?pneumonia - change to rocephin per pharmacy recs given large amt sodium in unasyn   ENDOCRINE A:  At risk hyperglcyemia with hypothermia - controlled P:   F/u glucose on chem   NEUROLOGIC A:  At risk severe anoxic brain injury - EEG w/o seizure activity ETOH use - at risk for withdrawal  P:   Ativan PRN for agitation or s/s wd Thiamine IV daily Folic acid IV daily  FAMILY  - Updates: Wife and daughter updated bedside, discussed code status at length, no reintubation and no CPR/cardioversion.  They are discussing PEG placement and where he would go from here but there is disagreement about that topic.  Luckily its not a decision that has be made today.  - Inter-disciplinary family meet or Palliative Care meeting due by:  10/18  Transfer to SDU and to [facility] service with PCCM off 10/18.  The patient is critically ill with multiple organ systems failure and  requires high complexity decision making for assessment and support, frequent evaluation and titration of therapies, application of advanced monitoring technologies and extensive interpretation of multiple databases.   Critical Care Time devoted to patient care services described in this note is  35  Minutes. This time reflects time of care of this signee Dr Jennet Maduro. This critical care time does not reflect procedure time, or teaching time or supervisory time of PA/NP/Med student/Med Resident etc but could involve care discussion time.  Rush Farmer, M.D. [facility] Pulmonary/Critical Care Medicine. Pager: [phone]. After hours pager: [phone].

## 2015-07-14 NOTE — Progress Notes (Signed)
eLink Physician-Brief Progress Note Patient Name: Mitchell [name] DOB: 1950/10/18 MRN: [id number]   Date of Service  07/14/2015  HPI/Events of Note  Hypokalemia  eICU Interventions  KCl 60 mEq IV central line. Repeat BMP @ 1400     Intervention Category Intermediate Interventions: Electrolyte abnormality - evaluation and management  Tera Partridge 07/14/2015, 5:55 AM

## 2015-07-14 NOTE — Care Management Important Message (Signed)
Important Message  Patient Details  Name: Mitchell [name] MRN: [id number] Date of Birth: [birth year]   Medicare Important Message Given:  Yes-third notification given    Delorse Lek 07/14/2015, 1:12 PM

## 2015-07-14 NOTE — Progress Notes (Signed)
Nutrition Follow-up   INTERVENTION:  Initiate Vital AF 1.2 @ 65 ml/hr via Cortrak feeding tube   Tube feeding regimen provides 1872 kcal, 117 grams of protein, and 1265 ml of H2O.   NUTRITION DIAGNOSIS:   Inadequate oral intake related to inability to eat as evidenced by NPO status. Ongoing.   GOAL:   Patient will meet greater than or equal to 90% of their needs Not yet met.   MONITOR:   I & O's, Labs, Weight trends, TF tolerance  REASON FOR ASSESSMENT:   Consult Enteral/tube feeding initiation and management  ASSESSMENT:   Pt with hx of ETOH abuse, per wife very sedentary with declining health status and appetite for the last several months. Pt admitted after arrest at home, CPR by wife.   Pt extubated 10/14. Remains NPO. Cortrak feeding tube placed. Family deciding on next steps, PEG, etc.  Sodium elevated 149  Diet Order:  Diet NPO time specified  Skin:  Reviewed, no issues  Last BM:  10/15  Height:   Ht Readings from Last 1 Encounters:  07/14/15 5' 8" (1.727 m)   Weight:   Wt Readings from Last 1 Encounters:  07/14/15 177 lb 11.1 oz (80.6 kg)  Admission weight: 79.3 kg 10/11  Ideal Body Weight:  70 kg  BMI:  Body mass index is 27.02 kg/(m^2).  Estimated Nutritional Needs:   Kcal:  1800-2000  Protein:  100-120 grams  Fluid:  >1.8 L/day  EDUCATION NEEDS:   No education needs identified at this time  [place], [place], [place] Pager [phone] After Hours Pager

## 2015-07-14 NOTE — Progress Notes (Addendum)
SLP Cancellation Note  Patient Details Name: Mitchell [name] MRN: [id number] DOB: 05/03/1951   Cancelled treatment:        Spoke with RN earlier reporting pt continues with significantly decreased endurance and not appropriate for swallow assessment at that time. Note Palliative care mtng 10/18. ST will return next date.   [place] Siren 07/14/2015, 3:05 PM  Orbie Pyo Colvin Caroli.Ed CCC-SLP Pager [phone] '

## 2015-07-15 DIAGNOSIS — D696 Thrombocytopenia, unspecified: Secondary | ICD-10-CM

## 2015-07-15 DIAGNOSIS — I5022 Chronic systolic (congestive) heart failure: Secondary | ICD-10-CM | POA: Diagnosis present

## 2015-07-15 DIAGNOSIS — N179 Acute kidney failure, unspecified: Secondary | ICD-10-CM | POA: Diagnosis present

## 2015-07-15 DIAGNOSIS — J9601 Acute respiratory failure with hypoxia: Secondary | ICD-10-CM | POA: Diagnosis present

## 2015-07-15 DIAGNOSIS — J690 Pneumonitis due to inhalation of food and vomit: Secondary | ICD-10-CM

## 2015-07-15 DIAGNOSIS — G9340 Encephalopathy, unspecified: Secondary | ICD-10-CM

## 2015-07-15 DIAGNOSIS — J398 Other specified diseases of upper respiratory tract: Secondary | ICD-10-CM

## 2015-07-15 DIAGNOSIS — J811 Chronic pulmonary edema: Secondary | ICD-10-CM | POA: Diagnosis present

## 2015-07-15 DIAGNOSIS — J189 Pneumonia, unspecified organism: Secondary | ICD-10-CM

## 2015-07-15 DIAGNOSIS — F1010 Alcohol abuse, uncomplicated: Secondary | ICD-10-CM

## 2015-07-15 DIAGNOSIS — G931 Anoxic brain damage, not elsewhere classified: Secondary | ICD-10-CM | POA: Diagnosis present

## 2015-07-15 DIAGNOSIS — J810 Acute pulmonary edema: Secondary | ICD-10-CM

## 2015-07-15 LAB — CBC
HCT: 24 % — ABNORMAL LOW (ref 39.0–52.0)
Hemoglobin: 8.1 g/dL — ABNORMAL LOW (ref 13.0–17.0)
MCH: 30.8 pg (ref 26.0–34.0)
MCHC: 33.8 g/dL (ref 30.0–36.0)
MCV: 91.3 fL (ref 78.0–100.0)
PLATELETS: 121 10*3/uL — AB (ref 150–400)
RBC: 2.63 MIL/uL — AB (ref 4.22–5.81)
RDW: 15.1 % (ref 11.5–15.5)
WBC: 6.9 10*3/uL (ref 4.0–10.5)

## 2015-07-15 LAB — BASIC METABOLIC PANEL
ANION GAP: 11 (ref 5–15)
BUN: 22 mg/dL — ABNORMAL HIGH (ref 6–20)
CALCIUM: 8.7 mg/dL — AB (ref 8.9–10.3)
CO2: 24 mmol/L (ref 22–32)
Chloride: 111 mmol/L (ref 101–111)
Creatinine, Ser: 1.99 mg/dL — ABNORMAL HIGH (ref 0.61–1.24)
GFR, EST AFRICAN AMERICAN: 39 mL/min — AB (ref >60)
GFR, EST NON AFRICAN AMERICAN: 33 mL/min — AB (ref >60)
Glucose, Bld: 91 mg/dL (ref 65–99)
Potassium: 3.7 mmol/L (ref 3.5–5.1)
SODIUM: 146 mmol/L — AB (ref 135–145)

## 2015-07-15 LAB — GLUCOSE, CAPILLARY
GLUCOSE-CAPILLARY: 80 mg/dL (ref 65–99)
GLUCOSE-CAPILLARY: 88 mg/dL (ref 65–99)
GLUCOSE-CAPILLARY: 93 mg/dL (ref 65–99)
Glucose-Capillary: 109 mg/dL — ABNORMAL HIGH (ref 65–99)
Glucose-Capillary: 83 mg/dL (ref 65–99)
Glucose-Capillary: 86 mg/dL (ref 65–99)
Glucose-Capillary: 91 mg/dL (ref 65–99)
Glucose-Capillary: 98 mg/dL (ref 65–99)

## 2015-07-15 LAB — CREATININE, URINE, RANDOM: Creatinine, Urine: 160.51 mg/dL

## 2015-07-15 LAB — MAGNESIUM: MAGNESIUM: 1.8 mg/dL (ref 1.7–2.4)

## 2015-07-15 LAB — PHOSPHORUS: PHOSPHORUS: 3.4 mg/dL (ref 2.5–4.6)

## 2015-07-15 LAB — NA AND K (SODIUM & POTASSIUM), RAND UR
Potassium Urine: 66 mmol/L
SODIUM UR: 113 mmol/L

## 2015-07-15 LAB — URIC ACID: URIC ACID, SERUM: 9.4 mg/dL — AB (ref 4.4–7.6)

## 2015-07-15 MED ORDER — FOLIC ACID 5 MG/ML IJ SOLN: 1.0000 mg | Freq: Every day | INTRAMUSCULAR | Status: DC

## 2015-07-15 MED ORDER — LORAZEPAM 2 MG/ML IJ SOLN: 2.0000 mg | INTRAMUSCULAR | Status: DC | PRN

## 2015-07-15 MED ORDER — METOPROLOL TARTRATE 1 MG/ML IV SOLN
5.0000 mg | Freq: Four times a day (QID) | INTRAVENOUS | Status: DC
Start: 2015-07-15 — End: 2015-07-17
  Administered 2015-07-15 – 2015-07-17 (×10): 5 mg via INTRAVENOUS
  Filled 2015-07-15 (×10): qty 5

## 2015-07-15 MED ORDER — MIDAZOLAM HCL 2 MG/2ML IJ SOLN: 1.0000 mg | Freq: Once | INTRAMUSCULAR | Status: DC

## 2015-07-15 MED ORDER — THIAMINE HCL 100 MG/ML IJ SOLN: 100.0000 mg | Freq: Every day | INTRAMUSCULAR | Status: DC

## 2015-07-15 NOTE — Progress Notes (Signed)
Panda clogged and removed. MD called for new orders for NG tube. NG attempted X2. Pt continued to cough and it kept passing through the mouth. MD made aware

## 2015-07-15 NOTE — Evaluation (Signed)
Clinical/Bedside Swallow Evaluation Patient Details  Name: Mitchell [name] MRN: [id number] Date of Birth: [birth year]  Today's Date: 07/15/2015 Time: SLP Start Time (ACUTE ONLY): 1048 SLP Stop Time (ACUTE ONLY): 1106 SLP Time Calculation (min) (ACUTE ONLY): 18 min  Past Medical History:  Past Medical History  Diagnosis Date  . Hypertension   . Alcohol abuse   . Gout   . Hyperlipemia    Past Surgical History: History reviewed. No pertinent past surgical history. HPI:  Pt is a 64 yo AAM with incomplete pmh but known HTN, Gout, Hyperlipidemia, ETOH abuse who was noted to have respiratory distress and odd resp sounds per wife 1 min after laying back down early am 10/11 and EMS was activated. Wife attempted cpr at home. He was down for approximately 10 minutes AND when EMS he was in V fib. Shocked x 1 and epi x1 with return of circulation. Intubated and transported to [facility] ED. He was taken for CT of chest and head to rule out PE and or Neurological injury. Pt with possible tracheal/laryngeal/esophageal/pharyngeal injury from emergent intubation. Intubated 10/11-10/14. ENT perfomed laryngoscopy however unable to visualize larynx due to copious secretions.    Assessment / Plan / Recommendation Clinical Impression  Pt exhibits cognitive impairments, anoxic brain injury following cardiac arrest (?). Followed several directions given by wife to "open mouth and open eyes" and verbalized x 2. Difficulty managing secretions, mild pooling of saliva and coughing on secretions. Decreased awareness to po's, oral delays and initiated one swallow with puree consistency with puree. Return to po's predicted to be slow, given presentation and cognitive deficits. Recommend continue NPO, not appropriate/ready for MBS;  longer term nutritional needs may be beneficial.      Aspiration Risk  Severe    Diet Recommendation NPO;Alternative means - long-term        Other  Recommendations Oral Care Recommendations: Oral  care QID   Follow Up Recommendations       Frequency and Duration min 1 x/week  2 weeks   Pertinent Vitals/Pain none    SLP Swallow Goals     Swallow Study Prior Functional Status       General Other Pertinent Information: Pt is a 64 yo AAM with incomplete pmh but known HTN, Gout, Hyperlipidemia, ETOH abuse who was noted to have respiratory distress and odd resp sounds per wife 1 min after laying back down early am 10/11 and EMS was activated. Wife attempted cpr at home. He was down for approximately 10 minutes AND when EMS he was in V fib. Shocked x 1 and epi x1 with return of circulation. Intubated and transported to [facility] ED. He was taken for CT of chest and head to rule out PE and or Neurological injury. Pt with possible tracheal/laryngeal/esophageal/pharyngeal injury from emergent intubation. Intubated 10/11-10/14. ENT perfomed laryngoscopy however unable to visualize larynx due to copious secretions.  Type of Study: Bedside swallow evaluation Previous Swallow Assessment:  (none) Diet Prior to this Study: NPO Temperature Spikes Noted: Yes Respiratory Status: Room air History of Recent Intubation: Yes Length of Intubations (days):  (4-5) Date extubated: 07/11/15 Behavior/Cognition: Lethargic/Drowsy;Requires cueing Oral Cavity - Dentition:  (possibly missing several?) Self-Feeding Abilities: Total assist Patient Positioning: Upright in bed Baseline Vocal Quality: Low vocal intensity (vocalized several times) Volitional Cough: Cognitively unable to elicit Volitional Swallow: Unable to elicit    Oral/Motor/Sensory Function Overall Oral Motor/Sensory Function:  (no response to oral-motor commands)   Ice Chips Ice chips: Impaired Presentation: Spoon Oral Phase Impairments: Reduced  labial seal;Reduced lingual movement/coordination;Impaired anterior to posterior transit;Poor awareness of bolus Oral Phase Functional Implications: Prolonged oral transit Pharyngeal Phase  Impairments:  (did not appear to trigger swallow)   Thin Liquid Thin Liquid: Not tested    Nectar Thick Nectar Thick Liquid: Not tested   Honey Thick Honey Thick Liquid: Not tested   Puree Puree: Impaired Presentation: Spoon Oral Phase Impairments: Reduced lingual movement/coordination;Impaired anterior to posterior transit;Reduced labial seal Oral Phase Functional Implications: Prolonged oral transit Pharyngeal Phase Impairments: Suspected delayed Swallow   Solid   GO    Solid: Not tested       [place] Siren 07/15/2015,2:38 PM   Orbie Pyo Colvin Caroli.Ed [employer] [phone]

## 2015-07-15 NOTE — Progress Notes (Signed)
   07/15/15 1000  Clinical Encounter Type  Visited With Patient and family together  Visit Type Follow-up  Referral From Chaplain  Spiritual Encounters  Spiritual Needs Prayer  Stress Factors  Patient Stress Factors Health changes  Family Stress Factors Health changes  Chaplain visited with family and Pt; Chaplain was elated to see Pt awoke; Chaplain can return upon request

## 2015-07-15 NOTE — Progress Notes (Signed)
Paged on-call. Unable to give Versed since contraindicated on this units level of care. Spoke with Rapid Response nurse who isnt able to devote total time needed for sedation. Spoke with on-call, told to hold off versed and MRI until am

## 2015-07-15 NOTE — Progress Notes (Signed)
[place] TEAM 1 - Stepdown/ICU TEAM Progress Note  Mitchell [name] [id number] DOB: 02/15/1951 DOA: 07/08/2015 PCP: No primary care provider on file.  Admit HPI / Brief Narrative: 64 yo BM  PMHx HTN, HLD, Gout,  ETOH Abuse  Who was noted to have respiratory distress and odd resp sounds per wife 1 min after laying back down early am 10/11 and EMS was activated. Wife attempted cpr at home. He was down for approximately 10 minutes AND when EMS he was in V fib. Shocked x 1 and epi x1 with return of circulation. Intubated and transported to [facility] ED.    HPI/Subjective: 10/18 A/O 3 (does not know why), follows commands. Overnight NG tube became occluded and when withdrawn significant amount of blood also produced (exacerbation of tracheal tear? vs nasal source?). Tmax overnight 38.1C  Assessment/Plan: Acute Respiratory Failure Post Arrest -Resolved  Tracheal perforation (ill assume traumatic intubation) -blood from NG tube when removed -Hold on placing additional NG tube consulted IR for PEG placement -  CAP -Continue current antibiotic regimen  Post V fib arrest  -S/p Hypothermia protocol  H/o DVT -Concern ischemia as cause, patient currently cannot be anticoagulated secondary to tracheal tear  Chronic Systolic CHF -Hydralazine IV 10 mg TID -Metoprolol IV 5 mg QID -Strict in and out -Daily a.m. Weight -CVP monitoring  HTN -BP check q 4hr -See systolic CHF  Acute Renal Failure/ ATN post arrest -Continue D5 at 70ml/hr -Obtain FENA ; appears to be dehydrated  Hypernatremia -See acute renal failure  Nutrition -NPO -Panda with tube feeds -May need PEG.  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 Air is noted just anterior to the trachea in the lower neck region 10/11 EEG - markedly abnormal due to severe attenuation of background rhythms. No seizure activity.  SIGNIFICANT EVENTS: 10/11 - Admitted post cardiac arrest & cooling initiated 10/12 - Rewarming 10/13- awake, followed commands, normothermia stopped for severe shivering  10/14 echocardiogram; - LVEF 20%,Diffuse hypokinesis   Culture   Antibiotics: Unasyn 10/11>>10/16 Rocephin 10/16>>>   DVT prophylaxis: SCD   Devices   LINES / TUBES:  CVL ij 10/11>>>     Continuous Infusions: . sodium chloride Stopped (07/14/15 0700)  . dextrose 30 mL/hr at 07/15/15 0800  . feeding supplement (VITAL AF 1.2 CAL) Stopped (07/15/15 0100)    Objective: VITAL SIGNS: Temp: 98.3 F (36.8 C) (10/18 1734) Temp Source: Oral (10/18 1734) BP: 130/81 mmHg (10/18 1833) Pulse Rate: 117 (10/18 1905) SPO2; FIO2:   Intake/Output Summary (Last 24 hours) at 07/15/15 1924 Last data filed at 07/15/15 1400  Gross per 24 hour  Intake    810 ml  Output   1150 ml  Net   -340 ml     Exam: General: A/O 3 (does not know why), follows commands, appears to be in pain, No acute respiratory distress Eyes: ,negative scleral hemorrhage ENT: Negative Runny nose, negative gingival bleeding, Neck:  Negative scars,  masses, torticollis, lymphadenopathy, JVD, positive emphysema Rt>>Lt Lungs: Clear to auscultation bilaterally without wheezes or crackles Cardiovascular: Regular rate and rhythm without murmur gallop or rub normal S1 and S2 Abdomen:negative abdominal pain, nondistended, positive soft, bowel sounds, no rebound, no ascites, no appreciable  mass Extremities: bilateral wrists swollen warm to the touch painful Rt>>Lt Psychiatric:  Unable to fully assess  Neurologic:  Cranial nerves II through XII intact, tongue/uvula midline, RUE/RLE muscle strength 5/5, LUE/LLE muscle strength 1/5, sensation intact throughout, positive dysarthria, negative expressive aphasia, negative receptive aphasia.   Data Reviewed: Basic Metabolic Panel:  Recent Labs Lab 07/10/15 0346 07/11/15 0337 07/12/15 0415 07/12/15 1257 07/13/15 0456 07/14/15 0350 07/14/15 1451 07/15/15 0410  NA 143 140  --  148* 145 143 149* 146*  K 4.4 3.9  --  3.7 3.3* 3.1* 4.2 3.7  CL 112* 113*  --  114* 110 108 112* 111  CO2 17* 19*  --  25 25 25 24 24   GLUCOSE 81 112*  --  111* 84 80 83 91  BUN 10 16  --  19 24* 23* 22* 22*  CREATININE 2.10* 2.41*  --  2.09* 2.12* 1.91* 1.83* 1.99*  CALCIUM 7.4* 7.0*  --  7.7* 7.6* 8.1* 8.8* 8.7*  MG 2.3 2.0 1.9  --  1.8 2.0  --  1.8  PHOS 3.9  --   --   --   --   --   --  3.4   Liver Function Tests:  Recent Labs Lab 07/10/15 0346 07/11/15 0337  AST 62* 35  ALT 29 21  ALKPHOS 81 64  BILITOT 0.4 0.6  PROT 6.1* 5.5*  ALBUMIN 2.3* 1.9*   No results for input(s): LIPASE, AMYLASE in the last 168 hours. No results for input(s): AMMONIA in the last 168 hours. CBC:  Recent Labs Lab 07/10/15 1229 07/11/15 0337 07/12/15 0415 07/13/15 0456 07/14/15 0350 07/15/15 0410  WBC 13.9* 10.0 5.9 4.9 4.4 6.9  NEUTROABS 12.3* 9.3*  --   --   --   --   HGB 10.1* 9.0* 7.5* 7.8* 8.1* 8.1*  HCT 28.8* 26.2* 21.7* 23.1* 23.8* 24.0*  MCV 91.4 92.3 91.9 91.3 91.2 91.3  PLT 97* 72* 68* 92* 87* 121*   Cardiac Enzymes:  Recent Labs Lab 07/09/15 1500  TROPONINI 0.25*   BNP (last 3 results) No results for input(s): BNP in the last 8760 hours.  ProBNP (last 3 results) No results for input(s): PROBNP in the last 8760 hours.  CBG:  Recent Labs Lab 07/14/15 2030 07/15/15 0025 07/15/15 0454 07/15/15 0735 07/15/15 1623  GLUCAP  91 109* 98 88 93    Recent Results (from the past 240 hour(s))  MRSA PCR Screening     Status: None   Collection Time: 07/08/15  8:46 AM  Result Value Ref Range Status   MRSA by PCR NEGATIVE NEGATIVE Final    Comment:        The GeneXpert MRSA Assay (FDA approved for NASAL specimens only), is one component of a comprehensive MRSA colonization surveillance program. It is not intended to diagnose MRSA infection nor to guide or monitor treatment for MRSA infections.      Studies:  Recent x-ray studies have been reviewed in detail by the Attending Physician  Scheduled Meds:  Scheduled Meds: . antiseptic oral rinse  7 mL Mouth Rinse 6 times per day  . aspirin  300 mg Rectal Daily  . cefTRIAXone (ROCEPHIN)  IV  1 g Intravenous Q24H  .  chlorhexidine gluconate  15 mL Mouth Rinse BID  . folic acid  1 mg Intravenous Daily  . hydrALAZINE  10 mg Intravenous TID  . Influenza vac split quadrivalent PF  0.5 mL Intramuscular Tomorrow-1000  . metoprolol  5 mg Intravenous 4 times per day  . midazolam  1 mg Intravenous Once  . multivitamin  5 mL Per Tube Daily  . pantoprazole (PROTONIX) IV  40 mg Intravenous Q24H  . sodium chloride  10-40 mL Intracatheter Q12H  . thiamine IV  100 mg Intravenous Daily    Time spent on care of this patient: 40 mins   Tung Pustejovsky, Geraldo Docker , MD  [facility] Office  [phone] Pager - [phone]  On-Call/Text Page:      [email or web address]      password [email or web address]  If 7PM-7AM, please contact night-coverage [email or web address] Password [email or web address] 07/15/2015, 7:24 PM   LOS: 7 days   Care during the described time interval was provided by me .  I have reviewed this patient's available data, including medical history, events of note, physical examination, and all test results as part of my evaluation. I have personally reviewed and interpreted all radiology studies.   Dia Crawford, MD [phone] Pager

## 2015-07-15 NOTE — Progress Notes (Signed)
PULMONARY / CRITICAL CARE MEDICINE   Name: Mitchell [name] MRN: [id number] DOB: 1951/06/16    ADMISSION DATE:  07/08/2015   REFERRING MD :  EDP  CHIEF COMPLAINT:  S/P Arrest  INITIAL PRESENTATION: 64 yo AAM with incomplete pmh but known HTN, Gout, Hyperlipidemia, ETOH abuse who was noted to have respiratory distress and odd resp sounds per wife 1 min after laying back down early am 10/11 and EMS was activated. Wife attempted cpr at home. He was down for approximately 10 minutes AND when EMS he was in V fib. Shocked x 1 and epi x1 with return of circulation. Intubated and transported to [facility] ED.   STUDIES:  10/11 ct head - atrophy mild, neg acute 10/11 ct angio chest - There is extensive air in the neck and upper mediastinal region. Air is noted just anterior to the trachea in the lower neck region 10/11 EEG - attenuated background rhythm. No seizure activity.  SIGNIFICANT EVENTS: 10/11 - Admitted post cardiac arrest & cooling initiated 10/12 - Rewarming 10/13- awake, followed commands, normothermia stopped for severe shivering  10/14 echo - EF 20%  SUBJECTIVE:  Not following commands, airway protection questionable at best, continues to require NTS.  VITAL SIGNS: Temp:  [97.7 F (36.5 C)-99.8 F (37.7 C)] 97.7 F (36.5 C) (10/18 0800) Pulse Rate:  [103-124] 118 (10/18 0600) Resp:  [19-50] 33 (10/18 0600) BP: (129-178)/(78-104) 155/94 mmHg (10/18 0600) SpO2:  [96 %-100 %] 97 % (10/18 0600) Weight:  [78.6 kg (173 lb 4.5 oz)] 78.6 kg (173 lb 4.5 oz) (10/18 0415)  HEMODYNAMICS:    VENTILATOR SETTINGS:    INTAKE / OUTPUT:  Intake/Output Summary (Last 24 hours) at 07/15/15 1114 Last data filed at 07/15/15 1000  Gross per 24 hour  Intake   1155 ml  Output   2575 ml  Net  -1420 ml   PHYSICAL EXAMINATION: General: Not following commands, shakes head in bed. HEENT: No change in min crepitus, copious upper airway secretions  Cardiovascular:  Regular rate. No edema. No  appreciable JVD.  Pulmonary:  resps even non labored on [place], coarse, bibasilar crackles   Abdomen: Soft. Bowel sounds present. Nondistended.  Neurological: Moves head back and forth and moves all ext spontaneously but nothing to command.  LABS:   CBC  Recent Labs Lab 07/13/15 0456 07/14/15 0350 07/15/15 0410  WBC 4.9 4.4 6.9  HGB 7.8* 8.1* 8.1*  HCT 23.1* 23.8* 24.0*  PLT 92* 87* 121*   Coag's  Recent Labs Lab 07/08/15 1420  INR 1.32   BMET  Recent Labs Lab 07/14/15 0350 07/14/15 1451 07/15/15 0410  NA 143 149* 146*  K 3.1* 4.2 3.7  CL 108 112* 111  CO2 25 24 24   BUN 23* 22* 22*  CREATININE 1.91* 1.83* 1.99*  GLUCOSE 80 83 91   Electrolytes  Recent Labs Lab 07/10/15 0346  07/13/15 0456 07/14/15 0350 07/14/15 1451 07/15/15 0410  CALCIUM 7.4*  < > 7.6* 8.1* 8.8* 8.7*  MG 2.3  < > 1.8 2.0  --  1.8  PHOS 3.9  --   --   --   --  3.4  < > = values in this interval not displayed. Sepsis Markers  Recent Labs Lab 07/09/15 1137 07/09/15 1730  LATICACIDVEN 1.4 1.3   ABG  Recent Labs Lab 07/08/15 1130 07/10/15 0845 07/11/15 0341  PHART 7.397 7.345* 7.427  PCO2ART 20.4* 28.8* 27.8*  PO2ART 218* 137* 211*   Liver Enzymes  Recent Labs Lab 07/10/15  0346 07/11/15 0337  AST 62* 35  ALT 29 21  ALKPHOS 81 64  BILITOT 0.4 0.6  ALBUMIN 2.3* 1.9*   Cardiac Enzymes  Recent Labs Lab 07/08/15 1250 07/09/15 1500  TROPONINI 0.37* 0.25*   Glucose  Recent Labs Lab 07/14/15 1245 07/14/15 1638 07/14/15 2030 07/15/15 0025 07/15/15 0454 07/15/15 0735  GLUCAP 73 80 91 109* 98 88    Imaging Dg Abd Portable 1v  07/14/2015  CLINICAL DATA:  Repositioning of feeding tube EXAM: PORTABLE ABDOMEN - 1 VIEW COMPARISON:  Radiograph 07/14/2015 FINDINGS: Feeding tube with tip in the second portion duodenum. IMPRESSION: Feeding tube with weighted tip in second portion duodenum. Electronically Signed   By: Suzy Bouchard M.D.   On: 07/14/2015 15:42   Dg  Abd Portable 1v  07/14/2015  CLINICAL DATA:  Feeding tube placement. EXAM: PORTABLE ABDOMEN - 1 VIEW COMPARISON:  None. FINDINGS: The tip of the feeding tube is in the fundus of the stomach. There is a loop of the feeding tube which extends into the distal stomach. Bowel gas pattern is normal. IMPRESSION: Feeding tube in the stomach as described. Electronically Signed   By: Lorriane Shire M.D.   On: 07/14/2015 13:14   I reviewed abdominal x-ray myself, NGT in good position.  ASSESSMENT / PLAN:  PULMONARY OETT 10/11>>10/14 A: Acute Respiratory Failure Post Arrest Tracheal tear (ill assume traumatic intubation)- mild blood from ETT 10/15 Worsening infx  - acute pulmonary edema vs aspirated blood P:   NT suction prn Mobilize as able  Diuresis as below  Flutter as able  Empiric abx  Monitor closely for airway protection. If deteriorates then will transition to comfort care.  CARDIOVASCULAR CVL ij 10/11>>> A:  Post V fib arrest S/p Hypothermia protocol H/o DVT Concern ischemia as cause  P:  Cardiology following Rpt echo in 1 wk for recovery Metoprolol PO scheduled, hydralazine IV per cards  Consider further w/u if neuro recovery  Lopressor IV PRN  RENAL A:  Acute Renal Failure - ATN post arrest Lactic acidosis hypoMg  P:  May need renal US if deteriorates. Lasix 20 mg IV q6 hours x3 doses Replete K IV and PO. F/u chem, mg, phos   GASTROINTESTINAL A:  Unable to assess swallow. P:   Protonix IV daily Continuet TF May need PEG if continues to be unable to swallow.  HEMATOLOGIC A:   Anemia - blood loss from tracheal tear Thrombocytopenia (ETOH, dilution)  P:  SCD Off heparin   INFECTIOUS A:   Tracheal tear No fevers P:   Unasyn 10/11>>10/16 Rocephin 10/16>>>10/19  Continue empiric coverage for ?pneumonia - change to rocephin per pharmacy recs given large amt sodium in unasyn   ENDOCRINE A:  At risk hyperglcyemia with hypothermia - controlled P:    F/u glucose on chem   NEUROLOGIC A:  At risk severe anoxic brain injury - EEG w/o seizure activity ETOH use - at risk for withdrawal  P:   Ativan PRN for agitation or s/s wd Thiamine IV daily Folic acid IV daily  FAMILY  - Updates: No family bedside.  - Inter-disciplinary family meet or Palliative Care meeting due by:  10/18  Discussed with bedside RN.  PCCM will sign off, please call back if needed.  Rush Farmer, M.D. [facility] Pulmonary/Critical Care Medicine. Pager: [phone]. After hours pager: [phone].

## 2015-07-15 NOTE — Progress Notes (Signed)
[place] Progress Note Patient Name: Mitchell [name] DOB: [birth year] MRN: [id number]   Date of Service  07/15/2015  HPI/Events of Note  RN reports unable to pass NGT. Attempted twice but patient coughed & tube came out of mouth. Had some mild bleeding with attempted placement.  eICU Interventions  Hold on further NGT attempts. Patient may require PEG tube per progress note.     Intervention Category Intermediate Interventions: Other:  Tera Partridge 07/15/2015, 1:21 AM

## 2015-07-15 NOTE — Progress Notes (Signed)
[place] Progress Note Patient Name: Mitchell [name] DOB: 1951-01-31 MRN: [id number]   Date of Service  07/15/2015  HPI/Events of Note  RN notified "Loni Muse" pulled out. Requesting replacement with NGT due to frequent clogs  eICU Interventions  Replace with NGT. KUB to confirm placement.     Intervention Category Intermediate Interventions: Other:  Tera Partridge 07/15/2015, 1:00 AM

## 2015-07-16 ENCOUNTER — Inpatient Hospital Stay (HOSPITAL_COMMUNITY): Payer: Medicare Other

## 2015-07-16 DIAGNOSIS — R1310 Dysphagia, unspecified: Secondary | ICD-10-CM | POA: Diagnosis present

## 2015-07-16 LAB — COMPREHENSIVE METABOLIC PANEL
ALBUMIN: 2.1 g/dL — AB (ref 3.5–5.0)
ALT: 22 U/L (ref 17–63)
ANION GAP: 11 (ref 5–15)
AST: 40 U/L (ref 15–41)
Alkaline Phosphatase: 54 U/L (ref 38–126)
BUN: 22 mg/dL — AB (ref 6–20)
CHLORIDE: 110 mmol/L (ref 101–111)
CO2: 23 mmol/L (ref 22–32)
Calcium: 8.3 mg/dL — ABNORMAL LOW (ref 8.9–10.3)
Creatinine, Ser: 2.08 mg/dL — ABNORMAL HIGH (ref 0.61–1.24)
GFR calc Af Amer: 37 mL/min — ABNORMAL LOW (ref >60)
GFR, EST NON AFRICAN AMERICAN: 32 mL/min — AB (ref >60)
GLUCOSE: 97 mg/dL (ref 65–99)
POTASSIUM: 3.5 mmol/L (ref 3.5–5.1)
Sodium: 144 mmol/L (ref 135–145)
Total Bilirubin: 1.1 mg/dL (ref 0.3–1.2)
Total Protein: 5.7 g/dL — ABNORMAL LOW (ref 6.5–8.1)

## 2015-07-16 LAB — CBC WITH DIFFERENTIAL/PLATELET
BASOS ABS: 0 10*3/uL (ref 0.0–0.1)
Basophils Relative: 0 %
Eosinophils Absolute: 0.1 10*3/uL (ref 0.0–0.7)
Eosinophils Relative: 1 %
HEMATOCRIT: 23.4 % — AB (ref 39.0–52.0)
HEMOGLOBIN: 8 g/dL — AB (ref 13.0–17.0)
LYMPHS ABS: 0.8 10*3/uL (ref 0.7–4.0)
LYMPHS PCT: 8 %
MCH: 31.4 pg (ref 26.0–34.0)
MCHC: 34.2 g/dL (ref 30.0–36.0)
MCV: 91.8 fL (ref 78.0–100.0)
Monocytes Absolute: 1.2 10*3/uL — ABNORMAL HIGH (ref 0.1–1.0)
Monocytes Relative: 12 %
NEUTROS ABS: 7.6 10*3/uL (ref 1.7–7.7)
Neutrophils Relative %: 79 %
PLATELETS: 144 10*3/uL — AB (ref 150–400)
RBC: 2.55 MIL/uL — AB (ref 4.22–5.81)
RDW: 15.1 % (ref 11.5–15.5)
WBC: 9.6 10*3/uL (ref 4.0–10.5)

## 2015-07-16 LAB — GLUCOSE, CAPILLARY
GLUCOSE-CAPILLARY: 82 mg/dL (ref 65–99)
GLUCOSE-CAPILLARY: 84 mg/dL (ref 65–99)
GLUCOSE-CAPILLARY: 88 mg/dL (ref 65–99)
GLUCOSE-CAPILLARY: 89 mg/dL (ref 65–99)
Glucose-Capillary: 82 mg/dL (ref 65–99)

## 2015-07-16 LAB — MAGNESIUM: MAGNESIUM: 1.8 mg/dL (ref 1.7–2.4)

## 2015-07-16 MED ORDER — LORAZEPAM 2 MG/ML IJ SOLN
INTRAMUSCULAR | Status: AC
  Filled 2015-07-16: qty 1

## 2015-07-16 MED ORDER — CEFAZOLIN SODIUM-DEXTROSE 2-3 GM-% IV SOLR
2.0000 g | INTRAVENOUS | Status: AC
  Administered 2015-07-17: 2 g via INTRAVENOUS
  Filled 2015-07-16: qty 50

## 2015-07-16 MED ORDER — LORAZEPAM 2 MG/ML IJ SOLN
1.0000 mg | Freq: Once | INTRAMUSCULAR | Status: DC | PRN
  Administered 2015-07-16: 1 mg via INTRAVENOUS

## 2015-07-16 MED ORDER — LORAZEPAM 2 MG/ML IJ SOLN
1.0000 mg | Freq: Once | INTRAMUSCULAR | Status: AC
  Administered 2015-07-16: 1 mg via INTRAVENOUS
  Filled 2015-07-16: qty 1

## 2015-07-16 NOTE — Progress Notes (Signed)
[place] TEAM 1 - Stepdown/ICU TEAM PROGRESS NOTE  Mitchell [name] [id number] DOB: 1951/05/23 DOA: 07/08/2015 PCP: No primary care provider on file.  Admit HPI / Brief Narrative: 64 yo MHx HTN, HLD, Gout, and ETOH abuse who was noted to have respiratory distress and odd resp sounds per wife 1 min after laying downearly am 10/11.  EMS was activated. Wife attempted cpr at home. He was down for approximately 10 minutes when EMS arrived and noted he was in V fib. Shocked x 1 and epi x1 with return of circulation. Intubated and transported to [facility] ED.   Significant Events: 10/11 - Admitted post cardiac arrest & cooling initiated 10/11 ct head - atrophy mild, neg acute 10/11 ct angio chest - extensive air in the neck and upper mediastinal region. Air is noted just anterior to the trachea in the lower neck region 10/12 - Rewarming 10/13 - awake, followed commands  10/14 echocardiogram EF 20%, diffuse hypokinesis  HPI/Subjective: Patient is sedate at the time of my exam, having received 2 mg of IV Ativan in an attempt to obtain an MRI of the head.  Unfortunately patient was still unable to lay still in the exam was not able to be completed.  The patient can provide no history.  His wife and son are at the bedside and I have spoken to them at length.  They state that prior to the MRI attempt he did appear to be more interactive today and was requesting water.  Assessment/Plan:  Acute Respiratory Failure Post Arrest -Resolved  Tracheal perforation (assumed traumatic intubation) -blood from NG tube when removed -IR for PEG placement 10/20  Pulmonary infiltrate - acute pulmonary edema vs aspirated blood / PNA -Continue current antibiotic regimen to complete 10 days of tx  Post V fib arrest  -S/p Hypothermia protocol - possible anoxic brain injury - consider MRI head w/ anesthesia after PEG placed if mental status not signif improved   Anoxic brain injury vs CVA -EEG w/o seizure  activity -Brain MRI will require heavy sedation per anesthesia - postpone until after PEG placed   H/o DVT -patient currently cannot be anticoagulated secondary to tracheal tear  Chronic Systolic CHF -no evidence of acute volume overload at this time   HTN -BP currently reasonably controlled   Acute Renal Failure/ ATN post arrest -crt stable at ~2.0  Hypernatremia -resolved   Nutrition -NPO -PEG in IR 10/20 to allow nutrition   Anemia  -Acute blood loss from tracheal tear - Hgb holding steady   Thrombocytopenia  -on an upward trend   ETOH abuse  -Continue CIWA protocol  Code Status: DNR Family Communication: Spoke with wife and son at bedside Disposition Plan: SDU  Consultants: PCCM  Antibiotics: Unasyn 10/11 > 10/16 Rocephin 10/16 >  DVT prophylaxis: SCDs  Objective: Blood pressure 135/82, pulse 121, temperature 98.6 F (37 C), temperature source Axillary, resp. rate 23, height 5\' 8"  (1.727 m), weight 78.608 kg (173 lb 4.8 oz), SpO2 95 %.  Intake/Output Summary (Last 24 hours) at 07/16/15 1654 Last data filed at 07/16/15 0300  Gross per 24 hour  Intake      0 ml  Output    300 ml  Net   -300 ml   Exam: General: No acute respiratory distress - obtunded  Lungs: Clear to auscultation bilaterally without wheezes or crackles Cardiovascular: Regular rate and rhythm without murmur gallop or rub normal S1 and S2 Abdomen: Nontender, nondistended, soft, bowel sounds positive, no rebound, no ascites, no  appreciable mass Extremities: No significant cyanosis, clubbing, or edema bilateral lower extremities  Data Reviewed: Basic Metabolic Panel:  Recent Labs Lab 07/10/15 0346  07/12/15 0415  07/13/15 0456 07/14/15 0350 07/14/15 1451 07/15/15 0410 07/16/15 0517  NA 143  < >  --   < > 145 143 149* 146* 144  K 4.4  < >  --   < > 3.3* 3.1* 4.2 3.7 3.5  CL 112*  < >  --   < > 110 108 112* 111 110  CO2 17*  < >  --   < > 25 25 24 24 23   GLUCOSE 81  < >  --    < > 84 80 83 91 97  BUN 10  < >  --   < > 24* 23* 22* 22* 22*  CREATININE 2.10*  < >  --   < > 2.12* 1.91* 1.83* 1.99* 2.08*  CALCIUM 7.4*  < >  --   < > 7.6* 8.1* 8.8* 8.7* 8.3*  MG 2.3  < > 1.9  --  1.8 2.0  --  1.8 1.8  PHOS 3.9  --   --   --   --   --   --  3.4  --   < > = values in this interval not displayed.  CBC:  Recent Labs Lab 07/10/15 1229 07/11/15 0337 07/12/15 0415 07/13/15 0456 07/14/15 0350 07/15/15 0410 07/16/15 0517  WBC 13.9* 10.0 5.9 4.9 4.4 6.9 9.6  NEUTROABS 12.3* 9.3*  --   --   --   --  7.6  HGB 10.1* 9.0* 7.5* 7.8* 8.1* 8.1* 8.0*  HCT 28.8* 26.2* 21.7* 23.1* 23.8* 24.0* 23.4*  MCV 91.4 92.3 91.9 91.3 91.2 91.3 91.8  PLT 97* 72* 68* 92* 87* 121* 144*    Liver Function Tests:  Recent Labs Lab 07/10/15 0346 07/11/15 0337 07/16/15 0517  AST 62* 35 40  ALT 29 21 22   ALKPHOS 81 64 54  BILITOT 0.4 0.6 1.1  PROT 6.1* 5.5* 5.7*  ALBUMIN 2.3* 1.9* 2.1*   CBG:  Recent Labs Lab 07/15/15 2006 07/15/15 2334 07/16/15 0354 07/16/15 0740 07/16/15 1135  GLUCAP 86 83 89 82 84    Recent Results (from the past 240 hour(s))  MRSA PCR Screening     Status: None   Collection Time: 07/08/15  8:46 AM  Result Value Ref Range Status   MRSA by PCR NEGATIVE NEGATIVE Final    Comment:        The GeneXpert MRSA Assay (FDA approved for NASAL specimens only), is one component of a comprehensive MRSA colonization surveillance program. It is not intended to diagnose MRSA infection nor to guide or monitor treatment for MRSA infections.   Culture, blood (routine x 2)     Status: None (Preliminary result)   Collection Time: 07/15/15  8:24 PM  Result Value Ref Range Status   Specimen Description BLOOD RIGHT ARM  Final   Special Requests IN PEDIATRIC BOTTLE 1.5CC  Final   Culture NO GROWTH < 24 HOURS  Final   Report Status PENDING  Incomplete  Culture, blood (routine x 2)     Status: None (Preliminary result)   Collection Time: 07/15/15  8:41 PM  Result  Value Ref Range Status   Specimen Description BLOOD LEFT HAND  Final   Special Requests IN PEDIATRIC BOTTLE 2CC  Final   Culture NO GROWTH < 24 HOURS  Final   Report Status PENDING  Incomplete  Studies:   Recent x-ray studies have been reviewed in detail by the Attending Physician  Scheduled Meds:  Scheduled Meds: . antiseptic oral rinse  7 mL Mouth Rinse 6 times per day  . aspirin  300 mg Rectal Daily  . [START ON 07/17/2015]  ceFAZolin (ANCEF) IV  2 g Intravenous to XRAY  . chlorhexidine gluconate  15 mL Mouth Rinse BID  . folic acid  1 mg Intravenous Daily  . hydrALAZINE  10 mg Intravenous TID  . Influenza vac split quadrivalent PF  0.5 mL Intramuscular Tomorrow-1000  . LORazepam      . metoprolol  5 mg Intravenous 4 times per day  . multivitamin  5 mL Per Tube Daily  . pantoprazole (PROTONIX) IV  40 mg Intravenous Q24H  . sodium chloride  10-40 mL Intracatheter Q12H  . thiamine IV  100 mg Intravenous Daily    Time spent on care of this patient: 35 mins   Shanara Schnieders T , MD   [facility] Office  [phone] Pager - Text Page per Shea Evans as per below:  On-Call/Text Page:      [email or web address]      password [email or web address]  If 7PM-7AM, please contact night-coverage [email or web address] Password [email or web address] 07/16/2015, 4:54 PM   LOS: 8 days

## 2015-07-16 NOTE — Consult Note (Signed)
Chief Complaint: Patient was seen in consultation today for anoxic brain injury- dysphagia--need for percutaneous gastric tube Chief Complaint  Patient presents with  . Cardiac Arrest   at the request of [place]  Referring Physician(s): Dr Thereasa Solo  History of Present Illness: Mitchell [name] is a 64 y.o. male   Found down EMS called- V fib and intubated Cardiac arrest Anoxic brain injury Dysphagia- poss secondary traumatic intubation Failed swallow study Request made for percutaneous gastric tube placement Imaging was reviewed with Dr Vira Blanco procedure   Past Medical History  Diagnosis Date  . Hypertension   . Alcohol abuse   . Gout   . Hyperlipemia     History reviewed. No pertinent past surgical history.  Allergies: Hydrochlorothiazide and Pseudoephedrine  Medications: Prior to Admission medications   Medication Sig Start Date End Date Taking? Authorizing Provider  AZOR 10-40 MG tablet Take 1 tablet by mouth daily. 07/01/15  Yes Historical Provider, MD     History reviewed. No pertinent family history.  Social History   Social History  . Marital Status: Married    Spouse Name: N/A  . Number of Children: N/A  . Years of Education: N/A   Social History Main Topics  . Smoking status: Current Some Day Smoker    Types: Cigars  . Smokeless tobacco: None  . Alcohol Use: None  . Drug Use: None  . Sexual Activity: Not Asked   Other Topics Concern  . None   Social History Narrative     Review of Systems: A 12 point ROS discussed and pertinent positives are indicated in the HPI above.  All other systems are negative.  Review of Systems  Constitutional: Positive for activity change. Negative for fever.  Respiratory: Positive for shortness of breath.   Psychiatric/Behavioral: Positive for agitation.    Vital Signs: BP 135/82 mmHg  Pulse 121  Temp(Src) 100.4 F (38 C) (Axillary)  Resp 23  Ht 5\' 8"  (1.727 m)  Wt 173 lb 4.8 oz (78.608 kg)  BMI  26.36 kg/m2  SpO2 95%  Physical Exam  Cardiovascular: Normal rate.   Pulmonary/Chest: He is in respiratory distress. He has no wheezes.  Breathing is panting  Musculoskeletal:  No response Moves all 4s---not to command Unable to perform MRI - too fidgety- no orders to sedate  Skin: Skin is warm.  Psychiatric:  Consented wife at bedside  Nursing note and vitals reviewed.   Mallampati Score:  MD Evaluation Airway: Other (comments) Airway comments: difficult to assess Heart: WNL Abdomen: WNL Chest/ Lungs: WNL ASA  Classification: 3 Mallampati/Airway Score: Two  Imaging: Ct Head Wo Contrast  07/08/2015  CLINICAL DATA:  Status post cardiac arrest. EXAM: CT HEAD WITHOUT CONTRAST TECHNIQUE: Contiguous axial images were obtained from the base of the skull through the vertex without intravenous contrast. COMPARISON:  None. FINDINGS: No mass effect or midline shift. No evidence of acute intracranial hemorrhage, or infarction. No abnormal extra-axial fluid collections. Gray-white matter differentiation is normal. Basal cisterns are preserved. There is mild brain parenchymal atrophy and chronic small vessel disease changes. No depressed skull fractures. Visualized paranasal sinuses and mastoid air cells are not opacified. IMPRESSION: No acute intracranial abnormality. Mild brain parenchymal atrophy and chronic microvascular disease. Electronically Signed   By: Fidela Salisbury M.D.   On: 07/08/2015 07:59   Ct Angio Chest Pe W/cm &/or Wo Cm  07/08/2015  CLINICAL DATA:  Hypoxia.  Status post cardiac arrest. EXAM: CT ANGIOGRAPHY CHEST WITH CONTRAST TECHNIQUE: Multidetector CT  imaging of the chest was performed using the standard protocol during bolus administration of intravenous contrast. Multiplanar CT image reconstructions and MIPs were obtained to evaluate the vascular anatomy. CONTRAST:  68mL OMNIPAQUE IOHEXOL 350 MG/ML SOLN COMPARISON:  Chest radiograph July 08, 2015 FINDINGS: There  is no demonstrable pulmonary embolus. There is no appreciable thoracic aortic aneurysm. There is patchy atherosclerotic disease in the aortic arch region. Visualized great vessels show mild atherosclerotic change. There is extensive soft tissue air in the visualized lower neck tracking into the left superior mediastinum. There is an endotracheal tube present. Air is seen tracking slightly superior to the endotracheal tube in the lower neck region at the level of the thyroid, raising concern for potential tracheal injury. There is bibasilar atelectatic change. Areas of patchy ground-glass opacity bilaterally most likely represent early pulmonary edema. No pneumothorax. Visualized thyroid appears unremarkable. No adenopathy is appreciable. There are scattered foci of coronary artery calcification. Pericardium is not thickened. In the visualized upper abdomen, there are no appreciable lesions. There are no blastic or lytic bone lesions. There is degenerative change in the thoracic spine. Review of the MIP images confirms the above findings. IMPRESSION: There is extensive air in the neck and upper mediastinal region. Air is noted just anterior to the trachea in the lower neck region. Given the history and the presence of the endotracheal tube, question traumatic intubation with tracheal injury. No demonstrable pulmonary embolus.  No pneumothorax. Evidence of early pulmonary edema.  Bibasilar atelectatic change. No adenopathy. Critical Value/emergent results were called by telephone at the time of interpretation on 07/08/2015 at 8:01 am to Dr. Pryor Curia , who verbally acknowledged these results. Electronically Signed   By: Lowella Grip III M.D.   On: 07/08/2015 08:01   Dg Chest Port 1 View  07/13/2015  CLINICAL DATA:  Acute respiratory failure with hypoxemia EXAM: PORTABLE CHEST 1 VIEW COMPARISON:  1 day prior FINDINGS: Left internal jugular line tip at mid SVC. Midline trachea. Upper normal heart size.  Probable layering bilateral pleural effusions. No pneumothorax. Relatively diffuse interstitial and airspace disease. Slightly progressive, especially on the left. IMPRESSION: Slight worsening aeration. At least partially felt to be due to pulmonary edema. Multifocal infection or aspiration cannot be excluded. Probable layering bilateral pleural effusions. Electronically Signed   By: Abigail Miyamoto M.D.   On: 07/13/2015 07:59   Dg Chest Port 1 View  07/12/2015  CLINICAL DATA:  64 year old male with pulmonary edema. EXAM: PORTABLE CHEST 1 VIEW COMPARISON:  Chest x-ray 07/11/2015. FINDINGS: Patient has been extubated. Nasogastric tube has been removed. Left internal jugular central venous catheter with tip terminating in the mid superior vena cava. Lung volumes are slightly low. Worsening airspace consolidation throughout the lungs bilaterally (asymmetrically distributed), most severe in the right mid to lower lung. No definite pleural effusions. Cephalization of the pulmonary vasculature. Heart size is normal. The patient is rotated to the left on today's exam, resulting in distortion of the mediastinal contours and reduced diagnostic sensitivity and specificity for mediastinal pathology. IMPRESSION: 1. Support apparatus, as above. 2. Worsening asymmetrically distributed airspace consolidation in the lungs bilaterally, most severe in the right lower lobe, concerning for developing multilobar pneumonia, potentially sequela of recent aspiration. 3. Given the cephalization of pulmonary vasculature, some of these findings could be related to developing pulmonary edema, however, this is not favored given the normal cardiac size. Electronically Signed   By: Vinnie Langton M.D.   On: 07/12/2015 08:03   Dg Chest Port 1  View  07/11/2015  CLINICAL DATA:  Cardiac arrest.  Hypertension . EXAM: PORTABLE CHEST 1 VIEW COMPARISON:  07/08/2015. FINDINGS: Endotracheal tube, NG tube, left IJ line in stable position. Mild  infiltrate left lung base. Small left pleural effusion. Mild cardiomegaly. No pulmonary venous congestion No pneumothorax IMPRESSION: 1. Lines and tubes in stable position. 2. Mild left base infiltrate and left pleural effusion. 3. Mild cardiomegaly.  No pulmonary venous congestion . Electronically Signed   By: Marcello Moores  Register   On: 07/11/2015 07:21   Dg Chest Port 1 View  07/08/2015  CLINICAL DATA:  Central line placement.  Ventilator.  Post CPR EXAM: PORTABLE CHEST 1 VIEW COMPARISON:  07/08/2015 FINDINGS: Endotracheal tube 3 cm above the carina unchanged. Left jugular central venous catheter tip in the SVC in good position. No pneumothorax. NG tube in the stomach. Improved aeration of the lungs. Lungs are now clear without infiltrate or atelectasis. No effusion. IMPRESSION: Endotracheal tube in good position. Central venous catheter in good position. Improved aeration.  Lungs are clear. Electronically Signed   By: Franchot Gallo M.D.   On: 07/08/2015 09:26   Dg Chest Portable 1 View  07/08/2015  CLINICAL DATA:  Post code.  Assess endotracheal tube. EXAM: PORTABLE CHEST 1 VIEW COMPARISON:  None. FINDINGS: Endotracheal tube with tip measuring 3 cm above the carinal. Additional catheter projected over the left side of the neck is of nonspecific etiology and may be external. Shallow inspiration. Normal heart size and pulmonary vascularity. No focal airspace disease or consolidation in the lungs. No pneumothorax. Visualized ribs appear intact. Degenerative changes in the spine and shoulders. IMPRESSION: Endotracheal tube tip measures 3 cm above the carinal. No evidence of active pulmonary disease. Electronically Signed   By: Lucienne Capers M.D.   On: 07/08/2015 06:57   Dg Abd Portable 1v  07/14/2015  CLINICAL DATA:  Repositioning of feeding tube EXAM: PORTABLE ABDOMEN - 1 VIEW COMPARISON:  Radiograph 07/14/2015 FINDINGS: Feeding tube with tip in the second portion duodenum. IMPRESSION: Feeding tube with  weighted tip in second portion duodenum. Electronically Signed   By: Suzy Bouchard M.D.   On: 07/14/2015 15:42   Dg Abd Portable 1v  07/14/2015  CLINICAL DATA:  Feeding tube placement. EXAM: PORTABLE ABDOMEN - 1 VIEW COMPARISON:  None. FINDINGS: The tip of the feeding tube is in the fundus of the stomach. There is a loop of the feeding tube which extends into the distal stomach. Bowel gas pattern is normal. IMPRESSION: Feeding tube in the stomach as described. Electronically Signed   By: Lorriane Shire M.D.   On: 07/14/2015 13:14    Labs:  CBC:  Recent Labs  07/13/15 0456 07/14/15 0350 07/15/15 0410 07/16/15 0517  WBC 4.9 4.4 6.9 9.6  HGB 7.8* 8.1* 8.1* 8.0*  HCT 23.1* 23.8* 24.0* 23.4*  PLT 92* 87* 121* 144*    COAGS:  Recent Labs  07/08/15 0622 07/08/15 1420  INR 1.25 1.32  APTT 34  --     BMP:  Recent Labs  07/14/15 0350 07/14/15 1451 07/15/15 0410 07/16/15 0517  NA 143 149* 146* 144  K 3.1* 4.2 3.7 3.5  CL 108 112* 111 110  CO2 25 24 24 23   GLUCOSE 80 83 91 97  BUN 23* 22* 22* 22*  CALCIUM 8.1* 8.8* 8.7* 8.3*  CREATININE 1.91* 1.83* 1.99* 2.08*  GFRNONAA 35* 37* 33* 32*  GFRAA 41* 43* 39* 37*    LIVER FUNCTION TESTS:  Recent Labs  07/08/15 0622  07/10/15 0346 07/11/15 0337 07/16/15 0517  BILITOT 0.7 0.4 0.6 1.1  AST 229* 62* 35 40  ALT 34 29 21 22   ALKPHOS 99 81 64 54  PROT 6.2* 6.1* 5.5* 5.7*  ALBUMIN 2.6* 2.3* 1.9* 2.1*    TUMOR MARKERS: No results for input(s): AFPTM, CEA, CA199, CHROMGRNA in the last 8760 hours.  Assessment and Plan:  Cardiac arrest Anoxic brain injury Intubated and now severe dysphagia Scheduled for poss G tube in IR 10/20 Risks and Benefits discussed with the patient's wife including, but not limited to the need for a barium enema during the procedure, bleeding, infection, peritonitis, or damage to adjacent structures. All of her questions were answered, she is agreeable to proceed. Consent signed and in  chart.   Thank you for this interesting consult.  I greatly enjoyed meeting Mitchell [name] and look forward to participating in their care.  A copy of this report was sent to the requesting provider on this date.  Signed: Neythan Kozlov A 07/16/2015, 1:17 PM   I spent a total of 40 Minutes    in face to face in clinical consultation, greater than 50% of which was counseling/coordinating care for percutaneous G tube placement

## 2015-07-16 NOTE — Progress Notes (Signed)
Pt came down to MRI after being given 2mg  of Ativan.  Pt still was uncooperative and we were unable to obtain any diagnostic images.  Dr Dorothy Puffer was called and he stated he would have to re-evaluate and decide if it important enough to order under anesthesia.  Pt sent back up to floor.

## 2015-07-17 ENCOUNTER — Inpatient Hospital Stay (HOSPITAL_COMMUNITY): Payer: Medicare Other

## 2015-07-17 ENCOUNTER — Inpatient Hospital Stay (HOSPITAL_COMMUNITY): Payer: Medicare Other | Admitting: Anesthesiology

## 2015-07-17 ENCOUNTER — Encounter (HOSPITAL_COMMUNITY)
Admission: EM | Disposition: A | Discharge status: Skilled Nursing Facility | Payer: Self-pay | Source: Home / Self Care | Attending: Internal Medicine

## 2015-07-17 DIAGNOSIS — R4182 Altered mental status, unspecified: Secondary | ICD-10-CM | POA: Diagnosis present

## 2015-07-17 DIAGNOSIS — I639 Cerebral infarction, unspecified: Secondary | ICD-10-CM

## 2015-07-17 DIAGNOSIS — R410 Disorientation, unspecified: Secondary | ICD-10-CM

## 2015-07-17 DIAGNOSIS — R1310 Dysphagia, unspecified: Secondary | ICD-10-CM

## 2015-07-17 HISTORY — PX: RADIOLOGY WITH ANESTHESIA: SHX6223

## 2015-07-17 LAB — CBC WITH DIFFERENTIAL/PLATELET
Basophils Absolute: 0 10*3/uL (ref 0.0–0.1)
Basophils Relative: 0 %
EOS PCT: 1 %
Eosinophils Absolute: 0.1 10*3/uL (ref 0.0–0.7)
HCT: 22.6 % — ABNORMAL LOW (ref 39.0–52.0)
Hemoglobin: 7.6 g/dL — ABNORMAL LOW (ref 13.0–17.0)
LYMPHS ABS: 0.8 10*3/uL (ref 0.7–4.0)
LYMPHS PCT: 7 %
MCH: 30.9 pg (ref 26.0–34.0)
MCHC: 33.6 g/dL (ref 30.0–36.0)
MCV: 91.9 fL (ref 78.0–100.0)
Monocytes Absolute: 1 10*3/uL (ref 0.1–1.0)
Monocytes Relative: 9 %
NEUTROS ABS: 9 10*3/uL — AB (ref 1.7–7.7)
Neutrophils Relative %: 83 %
PLATELETS: 188 10*3/uL (ref 150–400)
RBC: 2.46 MIL/uL — ABNORMAL LOW (ref 4.22–5.81)
RDW: 14.8 % (ref 11.5–15.5)
WBC: 10.9 10*3/uL — AB (ref 4.0–10.5)

## 2015-07-17 LAB — MAGNESIUM: Magnesium: 1.7 mg/dL (ref 1.7–2.4)

## 2015-07-17 LAB — COMPREHENSIVE METABOLIC PANEL
ALT: 17 U/L (ref 17–63)
AST: 31 U/L (ref 15–41)
Albumin: 1.9 g/dL — ABNORMAL LOW (ref 3.5–5.0)
Alkaline Phosphatase: 54 U/L (ref 38–126)
Anion gap: 10 (ref 5–15)
BUN: 23 mg/dL — ABNORMAL HIGH (ref 6–20)
CHLORIDE: 114 mmol/L — AB (ref 101–111)
CO2: 22 mmol/L (ref 22–32)
CREATININE: 1.91 mg/dL — AB (ref 0.61–1.24)
Calcium: 8.3 mg/dL — ABNORMAL LOW (ref 8.9–10.3)
GFR, EST AFRICAN AMERICAN: 41 mL/min — AB (ref >60)
GFR, EST NON AFRICAN AMERICAN: 35 mL/min — AB (ref >60)
Glucose, Bld: 93 mg/dL (ref 65–99)
POTASSIUM: 3.2 mmol/L — AB (ref 3.5–5.1)
Sodium: 146 mmol/L — ABNORMAL HIGH (ref 135–145)
Total Bilirubin: 1 mg/dL (ref 0.3–1.2)
Total Protein: 5.6 g/dL — ABNORMAL LOW (ref 6.5–8.1)

## 2015-07-17 LAB — GLUCOSE, CAPILLARY
GLUCOSE-CAPILLARY: 74 mg/dL (ref 65–99)
GLUCOSE-CAPILLARY: 77 mg/dL (ref 65–99)
Glucose-Capillary: 86 mg/dL (ref 65–99)
Glucose-Capillary: 89 mg/dL (ref 65–99)
Glucose-Capillary: 92 mg/dL (ref 65–99)

## 2015-07-17 LAB — APTT: aPTT: 41 seconds — ABNORMAL HIGH (ref 24–37)

## 2015-07-17 LAB — PREPARE RBC (CROSSMATCH)

## 2015-07-17 LAB — PROTIME-INR
INR: 1.31 (ref 0.00–1.49)
PROTHROMBIN TIME: 16.4 s — AB (ref 11.6–15.2)

## 2015-07-17 LAB — ABO/RH: ABO/RH(D): B POS

## 2015-07-17 SURGERY — RADIOLOGY WITH ANESTHESIA
Anesthesia: General

## 2015-07-17 MED ORDER — LIDOCAINE HCL 1 % IJ SOLN
INTRAMUSCULAR | Status: AC
  Filled 2015-07-17: qty 20

## 2015-07-17 MED ORDER — FENTANYL CITRATE (PF) 100 MCG/2ML IJ SOLN
INTRAMUSCULAR | Status: AC | PRN
  Administered 2015-07-17: 50 ug via INTRAVENOUS

## 2015-07-17 MED ORDER — METOPROLOL TARTRATE 1 MG/ML IV SOLN
7.5000 mg | Freq: Four times a day (QID) | INTRAVENOUS | Status: DC
  Administered 2015-07-17 – 2015-07-18 (×4): 7.5 mg via INTRAVENOUS
  Filled 2015-07-17 (×5): qty 10

## 2015-07-17 MED ORDER — SODIUM CHLORIDE 0.9 % IV SOLN: Freq: Once | INTRAVENOUS | Status: DC

## 2015-07-17 MED ORDER — BACITRACIN-NEOMYCIN-POLYMYXIN 400-5-5000 EX OINT
1.0000 "application " | TOPICAL_OINTMENT | Freq: Every day | CUTANEOUS | Status: DC
  Administered 2015-07-18: 1 via TOPICAL
  Filled 2015-07-17 (×3): qty 1

## 2015-07-17 MED ORDER — MIDAZOLAM HCL 2 MG/2ML IJ SOLN
INTRAMUSCULAR | Status: AC
  Filled 2015-07-17: qty 4

## 2015-07-17 MED ORDER — ONDANSETRON HCL 4 MG/2ML IJ SOLN
INTRAMUSCULAR | Status: AC
  Filled 2015-07-17: qty 2

## 2015-07-17 MED ORDER — BACITRACIN-NEOMYCIN-POLYMYXIN OINTMENT TUBE
TOPICAL_OINTMENT | Freq: Every day | CUTANEOUS | Status: DC
  Administered 2015-07-17: 16:00:00 via TOPICAL
  Filled 2015-07-17: qty 15

## 2015-07-17 MED ORDER — MIDAZOLAM HCL 2 MG/2ML IJ SOLN: 2.0000 mg | Freq: Once | INTRAMUSCULAR | Status: DC

## 2015-07-17 MED ORDER — POTASSIUM CHLORIDE 10 MEQ/100ML IV SOLN
10.0000 meq | INTRAVENOUS | Status: AC
  Administered 2015-07-17 (×4): 10 meq via INTRAVENOUS
  Filled 2015-07-17 (×4): qty 100

## 2015-07-17 MED ORDER — IOHEXOL 300 MG/ML  SOLN
50.0000 mL | Freq: Once | INTRAMUSCULAR | Status: DC | PRN
  Administered 2015-07-17: 10 mL via INTRAVENOUS
  Filled 2015-07-17: qty 50

## 2015-07-17 MED ORDER — FENTANYL CITRATE (PF) 100 MCG/2ML IJ SOLN
INTRAMUSCULAR | Status: AC
  Filled 2015-07-17: qty 4

## 2015-07-17 MED ORDER — GLUCAGON HCL RDNA (DIAGNOSTIC) 1 MG IJ SOLR
INTRAMUSCULAR | Status: AC
  Filled 2015-07-17: qty 1

## 2015-07-17 MED ORDER — MIDAZOLAM HCL 2 MG/2ML IJ SOLN
INTRAMUSCULAR | Status: AC | PRN
  Administered 2015-07-17: 1 mg via INTRAVENOUS

## 2015-07-17 MED ORDER — CEFAZOLIN SODIUM-DEXTROSE 2-3 GM-% IV SOLR
INTRAVENOUS | Status: AC
  Administered 2015-07-17: 2 g via INTRAVENOUS
  Filled 2015-07-17: qty 50

## 2015-07-17 NOTE — Sedation Documentation (Signed)
Successful GTUBE placement

## 2015-07-17 NOTE — Progress Notes (Signed)
SLP Cancellation Note  Patient Details Name: Mitchell [name] MRN: [id number] DOB: 27-Jun-1951   Cancelled treatment:       Reason Eval/Treat Not Completed: Patient at procedure or test/unavailable, G-Tube placement, will f/u next day.   Lanier Ensign, Student-SLP  Lanier Ensign 07/17/2015, 11:42 AM

## 2015-07-17 NOTE — Procedures (Signed)
Interventional Radiology Procedure Note  Procedure: Placement of percutaneous 20F pull-through gastrostomy tube. Complications: None Recommendations: - NPO except for sips and chips remainder of today and overnight - Maintain G-tube to LWS until tomorrow morning  - May advance diet as tolerated and begin using tube tomorrow morning  Signed,   Jayleen Scaglione S. Quintavius Niebuhr, DO   

## 2015-07-17 NOTE — Progress Notes (Signed)
Mitchell [name] TEAM 1 - Stepdown/ICU TEAM Progress Note  Mitchell [name] [id number] DOB: 09-07-1951 DOA: 07/08/2015 PCP: No primary care provider on file.  Admit HPI / Brief Narrative: 64 yo BM  PMHx HTN, HLD, Gout,  ETOH Abuse  Who was noted to have respiratory distress and odd resp sounds per wife 1 min after laying back down early am 10/11 and EMS was activated. Wife attempted cpr at home. He was down for approximately 10 minutes AND when EMS he was in V fib. Shocked x 1 and epi x1 with return of circulation. Intubated and transported to [facility] ED.    HPI/Subjective: 10/20 A/O  0, just returned from procedure.   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 - EEG w/o seizure activity -Brain MRI; shows acute/subacute stroke see results below  Acute Respiratory Failure Post Arrest -Resolved  Tracheal perforation (ill assume traumatic intubation) -blood from NG tube when removed -Hold on placing additional NG tube consulted IR for PEG placement -  CAP -Continue current antibiotic regimen to complete 10 days of tx  Post V fib arrest  -S/p Hypothermia protocol  H/o DVT -Concern ischemia as cause, patient currently cannot be anticoagulated secondary to tracheal tear  Chronic Systolic CHF -DC scheduled Hydralazine IV  -Increase Metoprolol IV 7.5 mg QID -Strict in and out since admission + 568ml -Daily a.m. Weight -10/20 CVP= 8 mmHg -Transfuse for hemoglobin<8 -10/20 transfuse 1 unit PRBC  HTN -BP check q 4hr -See systolic CHF  Acute Renal Failure/ ATN post arrest -Continue D5 at 65ml/hr -Improving with hydration -Obtain FENA ; appears to be dehydrated  Hypernatremia -See acute renal failure  Hypokalemia -Potassium goal> 4 -Potassium IV 40 mEq  Nutrition -NPO -10/20 S/P PEG. Per IR may start tube feeds in the A.m. -Nutrition consult  Anemia  -Acute blood loss from tracheal tear and placement of PEG tube -Monitor closely, trending down -See chronic  CHF  Thrombocytopenia (ETOH, dilution) -Improving continue monitor  ETOH abuse  -Continue CIWA protocol use    Code Status: DO NOT RESUSCITATE Family Communication: Wife and son present at time of exam Disposition Plan:  SNF    Consultants: Dr.Wesam Kathryne Sharper [facility])   Procedure/Significant Events: 10/11 ct head - atrophy mild, neg acute 10/11 ct angio chest - There is extensive air in the neck and upper mediastinal region. Air is noted just anterior to the trachea in the lower neck region 10/11 EEG - markedly abnormal due to severe attenuation of background rhythms. No seizure activity. 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 -Minimal T2 changes w/i brainstem suggesting microvascular disease. 10/20 S/P IR GASTROSTOMY TUBE placed 10/20 transfuse 1 unit PRBC  Culture   Antibiotics: Unasyn 10/11>>10/16 Rocephin 10/16>>>   DVT prophylaxis: SCD   Devices   LINES / TUBES:  CVL ij 10/11>>>     Continuous Infusions: . dextrose 50 mL/hr at 07/17/15 1652  . feeding supplement (VITAL AF 1.2 CAL) Stopped (07/15/15 0100)    Objective: VITAL SIGNS: Temp: 99.2 F (37.3 C) (10/20 1643) Temp Source: Oral (10/20 1643) BP: 138/79 mmHg (10/20 1615) Pulse Rate: 111 (10/20 1615) SPO2; FIO2:   Intake/Output Summary (Last 24 hours) at 07/17/15 1829 Last data filed at 07/17/15 0438  Gross per 24 hour  Intake  0 ml  Output    450 ml  Net   -450 ml     Exam: General: A/O 0 just returned from procedure, No acute respiratory distress Eyes: ,negative scleral hemorrhage ENT: Negative Runny nose, negative gingival bleeding, Neck:  Negative scars, masses, torticollis,  lymphadenopathy, JVD, positive emphysema Rt>>Lt (improving) Lungs: Clear to auscultation bilaterally without wheezes or crackles Cardiovascular: Regular rate and rhythm without murmur gallop or rub normal S1 and S2 Abdomen:negative abdominal pain, nondistended, positive soft, bowel sounds, no rebound, no ascites, no appreciable mass Extremities: bilateral wrists swollen warm to the touch painful Rt>>Lt Psychiatric:  Unable to fully assess  Neurologic:  Unable to fully assess  Data Reviewed: Basic Metabolic Panel:  Recent Labs Lab 07/13/15 0456 07/14/15 0350 07/14/15 1451 07/15/15 0410 07/16/15 0517 07/17/15 0345  NA 145 143 149* 146* 144 146*  K 3.3* 3.1* 4.2 3.7 3.5 3.2*  CL 110 108 112* 111 110 114*  CO2 25 25 24 24 23 22   GLUCOSE 84 80 83 91 97 93  BUN 24* 23* 22* 22* 22* 23*  CREATININE 2.12* 1.91* 1.83* 1.99* 2.08* 1.91*  CALCIUM 7.6* 8.1* 8.8* 8.7* 8.3* 8.3*  MG 1.8 2.0  --  1.8 1.8 1.7  PHOS  --   --   --  3.4  --   --    Liver Function Tests:  Recent Labs Lab 07/11/15 0337 07/16/15 0517 07/17/15 0345  AST 35 40 31  ALT 21 22 17   ALKPHOS 64 54 54  BILITOT 0.6 1.1 1.0  PROT 5.5* 5.7* 5.6*  ALBUMIN 1.9* 2.1* 1.9*   No results for input(s): LIPASE, AMYLASE in the last 168 hours. No results for input(s): AMMONIA in the last 168 hours. CBC:  Recent Labs Lab 07/11/15 0337  07/13/15 0456 07/14/15 0350 07/15/15 0410 07/16/15 0517 07/17/15 0345  WBC 10.0  < > 4.9 4.4 6.9 9.6 10.9*  NEUTROABS 9.3*  --   --   --   --  7.6 9.0*  HGB 9.0*  < > 7.8* 8.1* 8.1* 8.0* 7.6*  HCT 26.2*  < > 23.1* 23.8* 24.0* 23.4* 22.6*  MCV 92.3  < > 91.3 91.2 91.3 91.8 91.9  PLT 72*  < > 92* 87* 121* 144* 188  < > = values in this interval not displayed. Cardiac Enzymes: No results for input(s): CKTOTAL, CKMB, CKMBINDEX, TROPONINI in the last 168 hours. BNP (last 3 results) No results for input(s): BNP in the last 8760 hours.  ProBNP (last 3 results) No results for input(s):  PROBNP in the last 8760 hours.  CBG:  Recent Labs Lab 07/16/15 1944 07/16/15 2349 07/17/15 0351 07/17/15 0753 07/17/15 1642  GLUCAP 82 88 89 92 74    Recent Results (from the past 240 hour(s))  MRSA PCR Screening     Status: None   Collection Time: 07/08/15  8:46 AM  Result Value Ref Range Status   MRSA by PCR NEGATIVE NEGATIVE Final    Comment:        The GeneXpert MRSA Assay (FDA approved for NASAL specimens only), is one component of a comprehensive MRSA colonization surveillance program. It is not intended to diagnose MRSA infection nor to guide or monitor treatment for MRSA infections.   Culture, blood (routine x 2)     Status: None (Preliminary result)   Collection Time: 07/15/15  8:24 PM  Result Value Ref Range Status   Specimen Description BLOOD RIGHT ARM  Final   Special Requests IN PEDIATRIC  BOTTLE 1.5CC  Final   Culture NO GROWTH 2 DAYS  Final   Report Status PENDING  Incomplete  Culture, blood (routine x 2)     Status: None (Preliminary result)   Collection Time: 07/15/15  8:41 PM  Result Value Ref Range Status   Specimen Description BLOOD LEFT HAND  Final   Special Requests IN PEDIATRIC BOTTLE 2CC  Final   Culture NO GROWTH 2 DAYS  Final   Report Status PENDING  Incomplete     Studies:  Recent x-ray studies have been reviewed in detail by the Attending Physician  Scheduled Meds:  Scheduled Meds: . sodium chloride   Intravenous Once  . antiseptic oral rinse  7 mL Mouth Rinse 6 times per day  . aspirin  300 mg Rectal Daily  . chlorhexidine gluconate  15 mL Mouth Rinse BID  . fentaNYL      . folic acid  1 mg Intravenous Daily  . Influenza vac split quadrivalent PF  0.5 mL Intramuscular Tomorrow-1000  . lidocaine      . metoprolol  7.5 mg Intravenous 4 times per day  . midazolam      . midazolam  2 mg Intravenous Once  . multivitamin  5 mL Per Tube Daily  . neomycin-bacitracin-polymyxin  1 application Topical Daily  .  neomycin-bacitracin-polymyxin   Topical Daily  . pantoprazole (PROTONIX) IV  40 mg Intravenous Q24H  . potassium chloride  10 mEq Intravenous Q1 Hr x 4  . sodium chloride  10-40 mL Intracatheter Q12H  . thiamine IV  100 mg Intravenous Daily    Time spent on care of this patient: 40 mins   WOODS, Geraldo Docker , MD  [facility] Office  [phone] Pager - [phone]  On-Call/Text Page:      [email or web address]      password [email or web address]  If 7PM-7AM, please contact night-coverage [email or web address] Password [email or web address] 07/17/2015, 6:29 PM   LOS: 9 days   Care during the described time interval was provided by me .  I have reviewed this patient's available data, including medical history, events of note, physical examination, and all test results as part of my evaluation. I have personally reviewed and interpreted all radiology studies.   Dia Crawford, MD [phone] Pager

## 2015-07-17 NOTE — Anesthesia Preprocedure Evaluation (Signed)
Anesthesia Evaluation  Patient identified by MRN, date of birth, ID band Patient awake    Reviewed: Allergy & Precautions, NPO status , Patient's Chart, lab work & pertinent test results  Airway Mallampati: III  TM Distance: >3 FB Neck ROM: Full    Dental   Pulmonary pneumonia, Current Smoker,  Tracheal perforation from traumatic intubation   breath sounds clear to auscultation       Cardiovascular hypertension, Pt. on medications and Pt. on home beta blockers +CHF   Rhythm:Regular Rate:Normal     Neuro/Psych Altered mental status    GI/Hepatic negative GI ROS, Neg liver ROS,   Endo/Other  negative endocrine ROS  Renal/GU ARFRenal disease     Musculoskeletal   Abdominal   Peds  Hematology  (+) anemia ,   Anesthesia Other Findings   Reproductive/Obstetrics                             Anesthesia Physical Anesthesia Plan  ASA: III  Anesthesia Plan: General   Post-op Pain Management:    Induction: Intravenous  Airway Management Planned: Oral ETT  Additional Equipment:   Intra-op Plan:   Post-operative Plan: Possible Post-op intubation/ventilation and Extubation in OR  Informed Consent: I have reviewed the patients History and Physical, chart, labs and discussed the procedure including the risks, benefits and alternatives for the proposed anesthesia with the patient or authorized representative who has indicated his/her understanding and acceptance.     Plan Discussed with: CRNA  Anesthesia Plan Comments:         Anesthesia Quick Evaluation

## 2015-07-17 NOTE — Sedation Documentation (Signed)
Denies pain. VS stable

## 2015-07-17 NOTE — Progress Notes (Deleted)
Patient still can not have MRI at this time still need info on shunt and clip that was placed in 2006 after previous stroke.

## 2015-07-17 NOTE — Transfer of Care (Signed)
Immediate Anesthesia Transfer of Care Note  Patient: Mitchell [name]  Procedure(s) Performed: Procedure(s): RADIOLOGY WITH ANESTHESIA (N/A)  Patient Location: PACU  Anesthesia Type:General  Level of Consciousness: awake, alert  and oriented  Airway & Oxygen Therapy: Patient Spontanous Breathing and Patient connected to nasal cannula oxygen  Post-op Assessment: Report given to RN and Post -op Vital signs reviewed and stable  Post vital signs: Reviewed and stable  Last Vitals:  Filed Vitals:   07/17/15 1143  BP: 140/86  Pulse: 108  Temp:   Resp: 22    Complications: No apparent anesthesia complications

## 2015-07-17 NOTE — Care Management Important Message (Signed)
Important Message  Patient Details  Name: Mitchell [name] MRN: [id number] Date of Birth: Aug 03, 1951   Medicare Important Message Given:  Yes-third notification given    Nathen May 07/17/2015, 1:39 PM

## 2015-07-17 NOTE — Care Management Note (Signed)
Case Management Note  Patient Details  Name: Gabrien Felman MRN: [id number] Date of Birth: [birth year]  Subjective/Objective: Pt admitted for Acute Respiratory Failure Post Cardiac Arrest. Pt has family support.                    Action/Plan: Pt with failed swallow evaluation-post g tube placement 07-17-15. Plan will be for SNF once medically stable. Per MD plan for d/c within 24-48 hrs. CM will continue to monitor.    Expected Discharge Date:                  Expected Discharge Plan:  [place]  In-House Referral:  Clinical Social Work  Discharge planning Services  CM Consult  Post Acute Care Choice:    Choice offered to:     DME Arranged:    DME Agency:     HH Arranged:    [place] Agency:     Status of Service:  In process, will continue to follow  Medicare Important Message Given:  Yes-third notification given Date Medicare IM Given:    Medicare IM give by:    Date Additional Medicare IM Given:    Additional Medicare Important Message give by:     If discussed at [place] of Stay Meetings, dates discussed:    Additional Comments:  Bethena Roys, RN 07/17/2015, 2:58 PM

## 2015-07-17 NOTE — Progress Notes (Signed)
Paged on-call. Pts lungs getting a little wet sounding. Stopped D5NS and NS

## 2015-07-18 ENCOUNTER — Inpatient Hospital Stay (HOSPITAL_COMMUNITY): Payer: Medicare Other

## 2015-07-18 ENCOUNTER — Encounter (HOSPITAL_COMMUNITY): Payer: Self-pay | Admitting: Radiology

## 2015-07-18 DIAGNOSIS — M1000 Idiopathic gout, unspecified site: Secondary | ICD-10-CM | POA: Diagnosis present

## 2015-07-18 LAB — GLUCOSE, CAPILLARY
GLUCOSE-CAPILLARY: 101 mg/dL — AB (ref 65–99)
GLUCOSE-CAPILLARY: 75 mg/dL (ref 65–99)
GLUCOSE-CAPILLARY: 76 mg/dL (ref 65–99)
GLUCOSE-CAPILLARY: 77 mg/dL (ref 65–99)
Glucose-Capillary: 80 mg/dL (ref 65–99)
Glucose-Capillary: 108 mg/dL — ABNORMAL HIGH (ref 65–99)

## 2015-07-18 LAB — COMPREHENSIVE METABOLIC PANEL
ALT: 15 U/L — ABNORMAL LOW (ref 17–63)
ANION GAP: 10 (ref 5–15)
AST: 36 U/L (ref 15–41)
Albumin: 1.9 g/dL — ABNORMAL LOW (ref 3.5–5.0)
Alkaline Phosphatase: 58 U/L (ref 38–126)
BUN: 20 mg/dL (ref 6–20)
CHLORIDE: 110 mmol/L (ref 101–111)
CO2: 22 mmol/L (ref 22–32)
CREATININE: 1.96 mg/dL — AB (ref 0.61–1.24)
Calcium: 7.9 mg/dL — ABNORMAL LOW (ref 8.9–10.3)
GFR, EST AFRICAN AMERICAN: 40 mL/min — AB (ref >60)
GFR, EST NON AFRICAN AMERICAN: 34 mL/min — AB (ref >60)
Glucose, Bld: 80 mg/dL (ref 65–99)
POTASSIUM: 3.3 mmol/L — AB (ref 3.5–5.1)
Sodium: 142 mmol/L (ref 135–145)
TOTAL PROTEIN: 5.5 g/dL — AB (ref 6.5–8.1)
Total Bilirubin: 1 mg/dL (ref 0.3–1.2)

## 2015-07-18 LAB — CBC WITH DIFFERENTIAL/PLATELET
BASOS PCT: 0 %
Basophils Absolute: 0 10*3/uL (ref 0.0–0.1)
EOS PCT: 1 %
Eosinophils Absolute: 0.1 10*3/uL (ref 0.0–0.7)
HCT: 25 % — ABNORMAL LOW (ref 39.0–52.0)
Hemoglobin: 8.3 g/dL — ABNORMAL LOW (ref 13.0–17.0)
Lymphocytes Relative: 9 %
Lymphs Abs: 1.1 10*3/uL (ref 0.7–4.0)
MCH: 29.7 pg (ref 26.0–34.0)
MCHC: 33.2 g/dL (ref 30.0–36.0)
MCV: 89.6 fL (ref 78.0–100.0)
MONO ABS: 1.2 10*3/uL — AB (ref 0.1–1.0)
Monocytes Relative: 10 %
Neutro Abs: 10 10*3/uL — ABNORMAL HIGH (ref 1.7–7.7)
Neutrophils Relative %: 80 %
PLATELETS: 189 10*3/uL (ref 150–400)
RBC: 2.79 MIL/uL — ABNORMAL LOW (ref 4.22–5.81)
RDW: 15.8 % — AB (ref 11.5–15.5)
WBC: 12.4 10*3/uL — AB (ref 4.0–10.5)

## 2015-07-18 LAB — TYPE AND SCREEN
ABO/RH(D): B POS
ANTIBODY SCREEN: NEGATIVE
Unit division: 0

## 2015-07-18 LAB — MAGNESIUM: Magnesium: 1.7 mg/dL (ref 1.7–2.4)

## 2015-07-18 MED ORDER — MAGNESIUM OXIDE 400 (241.3 MG) MG PO TABS
400.0000 mg | ORAL_TABLET | Freq: Once | ORAL | Status: AC
  Administered 2015-07-18: 400 mg
  Filled 2015-07-18: qty 1

## 2015-07-18 MED ORDER — FREE WATER
200.0000 mL | Freq: Four times a day (QID) | Status: DC
  Administered 2015-07-18 (×4): 200 mL

## 2015-07-18 MED ORDER — JEVITY 1.2 CAL PO LIQD
1000.0000 mL | ORAL | Status: DC
  Administered 2015-07-18: 1000 mL
  Administered 2015-07-18: 50 mL/h
  Administered 2015-07-19: 60 mL/h
  Filled 2015-07-18 (×4): qty 1000

## 2015-07-18 MED ORDER — MORPHINE SULFATE (PF) 2 MG/ML IV SOLN
2.0000 mg | INTRAVENOUS | Status: AC
  Administered 2015-07-18: 2 mg via INTRAVENOUS
  Filled 2015-07-18: qty 1

## 2015-07-18 MED ORDER — ACETAMINOPHEN 650 MG RE SUPP
650.0000 mg | RECTAL | Status: AC
  Administered 2015-07-18: 650 mg via RECTAL
  Filled 2015-07-18: qty 1

## 2015-07-18 MED ORDER — PRO-STAT SUGAR FREE PO LIQD
30.0000 mL | Freq: Two times a day (BID) | ORAL | Status: DC
  Administered 2015-07-18 – 2015-07-19 (×3): 30 mL
  Filled 2015-07-18 (×3): qty 30

## 2015-07-18 MED ORDER — FUROSEMIDE 10 MG/ML IJ SOLN
60.0000 mg | Freq: Once | INTRAMUSCULAR | Status: AC
  Administered 2015-07-18: 60 mg via INTRAVENOUS
  Filled 2015-07-18: qty 6

## 2015-07-18 MED ORDER — JEVITY 1.2 CAL PO LIQD: 20.0000 mL | ORAL | Status: DC

## 2015-07-18 MED ORDER — METOPROLOL TARTRATE 25 MG/10 ML ORAL SUSPENSION
25.0000 mg | Freq: Two times a day (BID) | ORAL | Status: DC
  Administered 2015-07-18: 25 mg
  Filled 2015-07-18 (×3): qty 10

## 2015-07-18 MED ORDER — JEVITY 1.2 CAL PO LIQD
1000.0000 mL | ORAL | Status: DC
  Administered 2015-07-18: 1000 mL
  Filled 2015-07-18: qty 1000

## 2015-07-18 MED ORDER — ACETAMINOPHEN 325 MG PO TABS: 650.0000 mg | ORAL_TABLET | ORAL | Status: DC

## 2015-07-18 MED ORDER — PREDNISONE 20 MG PO TABS
40.0000 mg | ORAL_TABLET | Freq: Every day | ORAL | Status: DC
  Administered 2015-07-19: 40 mg
  Filled 2015-07-18 (×2): qty 2

## 2015-07-18 MED ORDER — FUROSEMIDE 10 MG/ML IJ SOLN
INTRAMUSCULAR | Status: AC
  Filled 2015-07-18: qty 4

## 2015-07-18 NOTE — Progress Notes (Signed)
[place] TEAM 1 - Stepdown/ICU TEAM Progress Note  Mitchell [name] [id number] DOB: Nov 05, 1950 DOA: 07/08/2015 PCP: No primary care provider on file.  Admit HPI / Brief Narrative: 64 yo BM  PMHx HTN, HLD, Gout,  ETOH Abuse  Who was noted to have respiratory distress and odd resp sounds per wife 1 min after laying back down early am 10/11 and EMS was activated. Wife attempted cpr at home. He was down for approximately 10 minutes AND when EMS he was in V fib. Shocked x 1 and epi x1 with return of circulation. Intubated and transported to [facility] ED.    HPI/Subjective: 10/21 A/O  2 (does not know where, when), sitting in bed comfortably eating his dinner.  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 - EEG w/o seizure activity -Brain MRI; shows acute/subacute stroke see results below  Acute Respiratory Failure Post Arrest -Resolved  Tracheal perforation (ill assume traumatic intubation) -blood from NG tube when removed -Hold on placing additional NG tube; S/P IR PEG placement  CAP -Completed 10 days of tx  Post V fib arrest  -S/p Hypothermia protocol  H/o DVT -Concern ischemia as cause, patient currently cannot be anticoagulated secondary to tracheal tear  Chronic Systolic CHF -[id number] CVP= 8 mmHg -Transfuse for hemoglobin<8 -10/20 transfuse 1 unit PRBC -Start metoprolol 25 mg  BID per tube -Strict in and out since admission + 562ml -Daily a.m. Weight; 10/21 bed weight= 79 kg  HTN -BP check q 4hr -See systolic CHF  Acute Renal Failure/ ATN post arrest -Continue D5 at 45ml/hr -Improving with hydration -Obtain FENA ; appears to be dehydrated  Hypernatremia -See acute renal failure  Hypokalemia -Potassium goal> 4 -Potassium IV 40 mEq  Hypomagnesemia -Magnesium oxide 400 mg 1  Nutrition -10/21 Passed MBS; dysphagia 3 fluid in -10/21 S/P PEG.; Continue Jevity; goal rate 60 ml/h per nutrition  Anemia  -Acute blood loss from tracheal tear and placement of PEG  tube -Monitor closely, trending down -See chronic CHF  Thrombocytopenia (ETOH, dilution) -Improving continue monitor  ETOH abuse  -Continue CIWA protocol use  Gout flare -Uric acid goal<6 -10/18 uric acid= 9.4 -Prednisone 40 mg daily for 7 days (will allow patients renal function to improve)     Code Status: DO NOT RESUSCITATE Family Communication: Wife present at time of exam Disposition Plan:  SNF    Consultants: Dr.Wesam Kathryne [facility] M)   Procedure/Significant Events: 10/11 ct head - atrophy mild, neg acute 10/11 ct angio chest - There is extensive air in the neck and upper mediastinal region. Air is noted just anterior to the trachea in the lower neck region 10/11 EEG - markedly abnormal due to severe attenuation of background rhythms. No seizure activity. 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 -Minimal T2 changes w/i brainstem suggesting microvascular disease. 10/20 S/P IR GASTROSTOMY TUBE placed 10/20 transfuse 1 unit PRBC  Culture 10/11 MRSA by PCR negative 10/18 blood right arm/left hand NGTD   Antibiotics: Unasyn 10/11>>10/16 Rocephin 10/16>>> stopped 10/19 Cefazolin 10/20 one dose   DVT prophylaxis: SCD   Devices   LINES / TUBES:  CVL ij 10/11>>>     Continuous Infusions: . dextrose 20 mL (07/18/15 0155)  . feeding supplement (JEVITY 1.2 CAL) 1,000 mL (07/18/15 1618)  Objective: VITAL SIGNS: Temp: 98 F (36.7 C) (10/21 1614) Temp Source: Oral (10/21 1614) BP: 125/71 mmHg (10/21 1615) Pulse Rate: 106 (10/21 1615) SPO2; FIO2:   Intake/Output Summary (Last 24 hours) at 07/18/15 1811 Last data filed at 07/18/15 1600  Gross per 24 hour  Intake  1342.5 ml  Output   1300 ml  Net   42.5 ml     Exam: General: A/O  2 (does not know where, when), No acute respiratory distress Eyes: ,negative scleral hemorrhage ENT: Negative Runny nose, negative gingival bleeding, Neck:  Negative scars, masses, torticollis, lymphadenopathy, JVD, positive emphysema Rt>>Lt (improving) Lungs: Clear to auscultation bilaterally without wheezes or crackles Cardiovascular: Regular rate and rhythm without murmur gallop or rub normal S1 and S2 Abdomen:negative abdominal pain, nondistended, positive soft, bowel sounds, no rebound, no ascites, no appreciable mass Extremities: bilateral wrists swollen warm to the touch painful Rt>>Lt Psychiatric:  Negative depression, negative anxiety, negative fatigue, negative mania  Neurologic:  Cranial nerves II through XII intact, tongue/uvula midline, RUE/RLE strength 5/5 (fine motor function decrease), Left hemiparesis, sensation intact throughout, positive mild dysarthria, negative expressive aphasia, negative receptive aphasia.   Data Reviewed: Basic Metabolic Panel:  Recent Labs Lab 07/14/15 0350 07/14/15 1451 07/15/15 0410 07/16/15 0517 07/17/15 0345 07/18/15 0532  NA 143 149* 146* 144 146* 142  K 3.1* 4.2 3.7 3.5 3.2* 3.3*  CL 108 112* 111 110 114* 110  CO2 25 24 24 23 22 22   GLUCOSE 80 83 91 97 93 80  BUN 23* 22* 22* 22* 23* 20  CREATININE 1.91* 1.83* 1.99* 2.08* 1.91* 1.96*  CALCIUM 8.1* 8.8* 8.7* 8.3* 8.3* 7.9*  MG 2.0  --  1.8 1.8 1.7 1.7  PHOS  --   --  3.4  --   --   --    Liver Function Tests:  Recent Labs Lab 07/16/15 0517 07/17/15 0345 07/18/15 0532  AST 40 31 36  ALT 22 17 15*  ALKPHOS 54 54 58  BILITOT 1.1 1.0 1.0  PROT 5.7* 5.6* 5.5*  ALBUMIN 2.1* 1.9* 1.9*   No results for input(s): LIPASE, AMYLASE in the last 168 hours. No results for input(s): AMMONIA in the last 168 hours. CBC:  Recent Labs Lab 07/14/15 0350 07/15/15 0410 07/16/15 0517 07/17/15 0345 07/18/15 0532    WBC 4.4 6.9 9.6 10.9* 12.4*  NEUTROABS  --   --  7.6 9.0* 10.0*  HGB 8.1* 8.1* 8.0* 7.6* 8.3*  HCT 23.8* 24.0* 23.4* 22.6* 25.0*  MCV 91.2 91.3 91.8 91.9 89.6  PLT 87* 121* 144* 188 189   Cardiac Enzymes: No results for input(s): CKTOTAL, CKMB, CKMBINDEX, TROPONINI in the last 168 hours. BNP (last 3 results) No results for input(s): BNP in the last 8760 hours.  ProBNP (last 3 results) No results for input(s): PROBNP in the last 8760 hours.  CBG:  Recent Labs Lab 07/18/15 0006 07/18/15 0440 07/18/15 0716 07/18/15 1122 07/18/15 1619  GLUCAP 76 80 77 75 101*    Recent Results (from the past 240 hour(s))  Culture, blood (routine x 2)     Status: None (Preliminary result)   Collection Time: 07/15/15  8:24 PM  Result Value Ref Range Status   Specimen Description BLOOD RIGHT ARM  Final   Special Requests IN PEDIATRIC BOTTLE 1.5CC  Final   Culture NO GROWTH 2 DAYS  Final   Report Status PENDING  Incomplete  Culture, blood (routine x 2)     Status: None (Preliminary result)  Collection Time: 07/15/15  8:41 PM  Result Value Ref Range Status   Specimen Description BLOOD LEFT HAND  Final   Special Requests IN PEDIATRIC BOTTLE 2CC  Final   Culture NO GROWTH 2 DAYS  Final   Report Status PENDING  Incomplete     Studies:  Recent x-ray studies have been reviewed in detail by the Attending Physician  Scheduled Meds:  Scheduled Meds: . sodium chloride   Intravenous Once  . antiseptic oral rinse  7 mL Mouth Rinse 6 times per day  . aspirin  300 mg Rectal Daily  . chlorhexidine gluconate  15 mL Mouth Rinse BID  . feeding supplement (PRO-STAT SUGAR FREE 64)  30 mL Per Tube BID  . folic acid  1 mg Intravenous Daily  . free water  200 mL Per Tube QID  . Influenza vac split quadrivalent PF  0.5 mL Intramuscular Tomorrow-1000  . magnesium oxide  400 mg Per Tube Once  . metoprolol tartrate  25 mg Per Tube BID  . midazolam  2 mg Intravenous Once  . multivitamin  5 mL Per Tube  Daily  . neomycin-bacitracin-polymyxin  1 application Topical Daily  . neomycin-bacitracin-polymyxin   Topical Daily  . pantoprazole (PROTONIX) IV  40 mg Intravenous Q24H  . predniSONE  40 mg Per Tube Q breakfast  . sodium chloride  10-40 mL Intracatheter Q12H  . thiamine IV  100 mg Intravenous Daily    Time spent on care of this patient: 40 mins   Su Duma, Geraldo Docker , MD  [facility] Office  [phone] Pager - [phone]  On-Call/Text Page:      [email or web address]      password [email or web address]  If 7PM-7AM, please contact night-coverage [email or web address] Password [email or web address] 07/18/2015, 6:11 PM   LOS: 10 days   Care during the described time interval was provided by me .  I have reviewed this patient's available data, including medical history, events of note, physical examination, and all test results as part of my evaluation. I have personally reviewed and interpreted all radiology studies.   Dia Crawford, MD [phone] Pager

## 2015-07-18 NOTE — Progress Notes (Signed)
PT s/p perc G-tube yesterday. Feels pretty good, "thirsty'  BP 133/76 mmHg  Pulse 103  Temp(Src) 99.1 F (37.3 C) (Axillary)  Resp 24  Ht 5\' 8"  (1.727 m)  Wt 174 lb 3.2 oz (79.017 kg)  BMI 26.49 kg/m2  SpO2 99% G-tube intact, site clean, NT  OK to begin use for TF and/or meds at [place] PA-C Interventional Radiology 07/18/2015 9:27 AM

## 2015-07-18 NOTE — Progress Notes (Signed)
Speech Language Pathology Treatment: Dysphagia  Patient Details Name: Mitchell [name] MRN: [id number] DOB: 08-Nov-1950 Today's Date: 07/18/2015 Time: 0930-1005 SLP Time Calculation (min) (ACUTE ONLY): 35 min  Assessment / Plan / Recommendation Clinical Impression  Patient was seen to determine readiness for P.O.'s or objective swallow study. Patient is alert, cooperative and oriented to situation, place, self and recent events. SLP assessed patient's toleration of cup sips of thin liquids, and spoon bites of puree solids. Patient presented with timely swallow initiation, no oral residuals post swallow, good oral control and manipulation of boluses, no overt s/s of aspiration or penetration. SLP noted that patient did not achieve full laryngeal elevation during swallows of purees and thin liquids. Based on patient's prior h/o significant cognitive impairments and placement of PEG yesterday (10/20), recommend that patient have objective swallow study to assess swallow function and r/o aspiration prior to recommending P.O.'s   HPI Other Pertinent Information: Pt is a 64 yo AAM with incomplete pmh but known HTN, Gout, Hyperlipidemia, ETOH abuse who was noted to have respiratory distress and odd resp sounds per wife 1 min after laying back down early am 10/11 and EMS was activated. Wife attempted cpr at home. He was down for approximately 10 minutes AND when EMS he was in V fib. Shocked x 1 and epi x1 with return of circulation. Intubated and transported to [facility] ED. He was taken for CT of chest and head to rule out PE and or Neurological injury. Pt with possible tracheal/laryngeal/esophageal/pharyngeal injury from emergent intubation. Intubated 10/11-10/14. ENT perfomed laryngoscopy however unable to visualize larynx due to copious secretions.    Pertinent Vitals Pain Assessment: No/denies pain  SLP Plan       Recommendations Diet recommendations: NPO (Recommend MBSS to objectively assess swallow function)              Oral Care Recommendations: Oral care BID    Sonia Baller, MA, CCC-SLP 07/18/2015 1:19 PM

## 2015-07-18 NOTE — Progress Notes (Signed)
Nutrition Follow-up / Consult  DOCUMENTATION CODES:   Not applicable  INTERVENTION:    Continue Jevity 1.2 via PEG, increase by 10 ml every 4 hours to goal rate of 60 ml/h with Prostat 30 ml BID to provide 1928 kcals, 110 gm protein, and 1166 ml free water daily.  Once continuous TF tolerance established, can transition to bolus feedings if desired, goal for bolus feedings is 360 ml Jevity 1.2 4 times per day with Prostat 30 ml BID.  Add free water flushes 200 ml QID for total free water intake of 1966 ml per day  NUTRITION DIAGNOSIS:   Inadequate oral intake related to inability to eat as evidenced by NPO status.  Ongoing   GOAL:   Patient will meet greater than or equal to 90% of their needs  Progressing   MONITOR:   I & O's, Labs, Weight trends, TF tolerance  REASON FOR ASSESSMENT:   Consult Enteral/tube feeding initiation and management  ASSESSMENT:   Pt with hx of ETOH abuse, per wife very sedentary with declining health status and appetite for the last several months. Pt admitted after arrest at home, CPR by wife.   Labs reviewed. Potassium low at 3.3.  S/P PEG placement yesterday, ready to start feedings this AM. RN has initiated Jevity 1.2 at 20 ml/h. Received MD Consult for TF initiation and management. Previously received Vital AF 1.2 at 65 ml/h. SLP continues to follow for ability to complete an objective swallow evaluation. Patient is thirsty, requesting water to drink.   Diet Order:  Diet NPO time specified Except for: Sips with Meds  Skin:  Reviewed, no issues  Last BM:  10/19  Height:   Ht Readings from Last 1 Encounters:  07/14/15 5\' 8"  (1.727 m)    Weight:   Wt Readings from Last 1 Encounters:  07/18/15 174 lb 3.2 oz (79.017 kg)    Ideal Body Weight:  70 kg  BMI:  Body mass index is 26.49 kg/(m^2).  Estimated Nutritional Needs:   Kcal:  1800-2000  Protein:  100-120 grams  Fluid:  >1.8 L/day  EDUCATION NEEDS:   No education  needs identified at this time  Molli Barrows, [place], [place], [place] Pager [phone] After Hours Pager [phone]

## 2015-07-18 NOTE — Evaluation (Signed)
Occupational Therapy Evaluation Patient Details Name: Mitchell [name] MRN: [id number] DOB: July 24, 1951 Today's Date: 07/18/2015    History of Present Illness Pt is a 64 yo AAM with incomplete pmh but known HTN, Gout, Hyperlipidemia, ETOH abuse who was noted to have respiratory distress and odd resp sounds per wife 1 min after laying back down early am 10/11 and EMS was activated. Wife attempted cpr at home. He was down for approximately 10 minutes AND when EMS he was in V fib   Clinical Impression   Pt demonstrates decline in function and safety with ADLs and ADL mobility with decreased strength, balance, endurance and B UE ROM. Pt's O2 99% during activity and HR 113. PTA, pt independent with selfcare and mobility. Pt very pleasant and cooperative, acute OT will follow    Follow Up Recommendations  SNF;Supervision/Assistance - 24 hour    Equipment Recommendations    TBD at next venue of care   Recommendations for Other Services       Precautions / Restrictions Precautions Precautions: Fall Restrictions Weight Bearing Restrictions: No      Mobility Bed Mobility Overal bed mobility: Needs Assistance Bed Mobility: Supine to Sit;Sit to Supine     Supine to sit: Min assist Sit to supine: Min guard   General bed mobility comments: min A to bring B LEs to EOB, pt able to get LEs back onto bed with no physical assust  Transfers Overall transfer level: Needs assistance Equipment used: Rolling walker (2 wheeled) Transfers: Sit to/from [facility] Sit to Stand: Min assist Stand pivot transfers: Mod assist            Balance Overall balance assessment: Needs assistance Sitting-balance support: No upper extremity supported;Feet supported Sitting balance-Leahy Scale: Fair     Standing balance support: During functional activity;Bilateral upper extremity supported Standing balance-Leahy Scale: Poor                              ADL Overall ADL's  : Needs assistance/impaired     Grooming: Wash/dry hands;Wash/dry face;Sitting;Min guard   Upper Body Bathing: Min guard;Sitting   Lower Body Bathing: Maximal assistance   Upper Body Dressing : Min guard;Sitting   Lower Body Dressing: Maximal assistance   Toilet Transfer: RW;BSC;Stand-pivot;Moderate assistance   Toileting- Clothing Manipulation and Hygiene: Maximal assistance       Functional mobility during ADLs: Moderate assistance;Rolling walker General ADL Comments: min cues for task continuation due to distractability     Vision  wears glasses at all times, no change from baseline   Perception Perception Perception Tested?: No   Praxis Praxis Praxis tested?: Not tested    Pertinent Vitals/Pain Pain Assessment: No/denies pain     Hand Dominance Right   Extremity/Trunk Assessment Upper Extremity Assessment Upper Extremity Assessment: Generalized weakness   Lower Extremity Assessment Lower Extremity Assessment: Defer to PT evaluation       Communication Communication Communication: No difficulties   Cognition Arousal/Alertness: Awake/alert Behavior During Therapy:  (easily distracted) Overall Cognitive Status: Within Functional Limits for tasks assessed                     General Comments   pt pleasant, cooperative and jovial                 Home Living Family/patient expects to be discharged to:: Private residence Living Arrangements: Spouse/significant other Available Help at Discharge: Family Type of Home: [place]  Access: Level entry     Home Layout: One level     Bathroom Shower/Tub: Teacher, early years/pre: Standard     Home Equipment: Cane - single point;Walker - 2 wheels;Shower seat;Bedside commode   Additional Comments: wife's equipment       Prior Functioning/Environment Level of Independence: Independent             OT Diagnosis: Generalized weakness   OT Problem List: Decreased  strength;Decreased knowledge of use of DME or AE;Decreased activity tolerance;Decreased safety awareness;Impaired balance (sitting and/or standing)   OT Treatment/Interventions: Self-care/ADL training;Patient/family education;Therapeutic activities;DME and/or AE instruction;Neuromuscular education;Therapeutic exercise    OT Goals(Current goals can be found in the care plan section) Acute Rehab OT Goals Patient Stated Goal: get rehab and go home OT Goal Formulation: With patient/family Time For Goal Achievement: 07/25/15 Potential to Achieve Goals: Good ADL Goals Pt Will Perform Grooming: with min assist;standing Pt Will Perform Upper Body Bathing: with set-up;with supervision;sitting Pt Will Perform Lower Body Bathing: with min assist;sitting/lateral leans;sit to/from stand Pt Will Perform Upper Body Dressing: with set-up;with supervision;sitting Pt Will Transfer to Toilet: with min assist  OT Frequency: Min 2X/week   Barriers to D/C: Decreased caregiver support                        End of Session Equipment Utilized During Treatment: Gait belt;Rolling walker  Activity Tolerance: Patient tolerated treatment well Patient left: in bed;with call bell/phone within reach;with bed alarm set;with family/visitor present   Time: [id number] OT Time Calculation (min): 32 min Charges:  OT General Charges $OT Visit: 1 Procedure OT Evaluation $Initial OT Evaluation Tier I: 1 Procedure OT Treatments $Self Care/Home Management : 8-22 mins $Therapeutic Activity: 8-22 mins G-Codes:    Britt Bottom 07/18/2015, 2:22 PM

## 2015-07-18 NOTE — Progress Notes (Signed)
Patient is receiving a unit of PRB"C and his temp prior to start was 100.2. After 2 hours into the transfusion he has as temp of 101.0. Patient has no c/o's at this time otherwise. Baltazar Najjar NP paged to make aware. [facility] Rn

## 2015-07-18 NOTE — Clinical Social Work Note (Signed)
Patient has bed at [facility]. Wife will complete paperwork today. Report will be left for weekend CSW. MD please sign FL2 on chart and any needed prescriptions.   Liz Beach MSW, [place], [place], [phone]

## 2015-07-18 NOTE — Clinical Social Work Note (Signed)
Clinical Social Work Assessment  Patient Details  Name: Mitchell [name] MRN: [id number] Date of Birth: [birth year]  Date of referral:  07/18/15               Reason for consult:  Discharge Planning, Facility Placement                Permission sought to share information with:  Family Supports, Chartered certified accountant granted to share information::  Yes, Hospital doctor (By wife)  Name::     Museum/gallery curator::  SNFs  Relationship::     Contact Information:     Housing/Transportation Living arrangements for the past 2 months:  Single Family Home Source of Information:  Spouse Patient Interpreter Needed:  None Criminal Activity/Legal Involvement Pertinent to Current Situation/Hospitalization:  No - Comment as needed Significant Relationships:  Spouse Lives with:  Spouse Do you feel safe going back to the place where you live?  Yes Need for family participation in patient care:  Yes (Comment)  Care giving concerns:  Patient's wife agrees that she cannot manage the patient at home at this time and requests placement.   Social Worker assessment / plan:  CSW met with patient's wife at bedside. Patient currently away for Peg tube placement and MRI. CSW explained reason for visit and CSW's role. Wife Mitchell [name] states that she agrees with the treatment team's rec of SNF at discharge. She specifically requests [facility] SNF as this is closest to her home. Mitchell [name] reports that it is just her and the patient in the home. CSW explained SNF search/placement process and answered Shirley's questions. CSW will followup with available bed offers.  Employment status:  Disabled (Comment on whether or not currently receiving Disability), Retired Forensic scientist:  Medicare PT Recommendations:  Not assessed at this time Information / Referral to community resources:  [place]  Patient/Family's Response to care:  Patient's wife Mitchell [name] appears to be happy with the  care the patient is receiving.  Patient/Family's Understanding of and Emotional Response to Diagnosis, Current Treatment, and Prognosis:  Patient's wife Mitchell [name] has good understanding of reason for admission and the patient's diagnosis. Mitchell [name] has a good understanding of the patient's post DC needs.   Emotional Assessment Appearance:  Appears stated age Attitude/Demeanor/Rapport:  Unable to Assess Affect (typically observed):  Unable to Assess Orientation:  Oriented to Self, Oriented to Place Alcohol / Substance use:  Tobacco Use Psych involvement (Current and /or in the community):  No (Comment)  Discharge Needs  Concerns to be addressed:  Discharge Planning Concerns Readmission within the last 30 days:  No Current discharge risk:  Chronically ill, Physical Impairment, Cognitively Impaired Barriers to Discharge:  Continued Medical Work up   [employer] MSW, [place], [place], [phone]

## 2015-07-18 NOTE — Evaluation (Signed)
Physical Therapy Evaluation Patient Details Name: Mitchell [name] MRN: [id number] DOB: 11-29-1950 Today's Date: 07/18/2015   History of Present Illness  Pt is a 64 yo AAM with incomplete pmh but known HTN, Gout, Hyperlipidemia, ETOH abuse who was noted to have respiratory distress and odd resp sounds per wife 1 min after laying back down early am 10/11 and EMS was activated. Wife attempted cpr at home. He was down for approximately 10 minutes AND when EMS he was in V fib. MRI- acute/subacute infarct left frontal lobe.  Clinical Impression  Patient presents with generalized weakness, balance deficits, and impaired mobility due to hospitalization and acute vs subacute CVA. Pt easily distracted throughout session with lines and requires cues to stay focused on task. Tolerated transfers and pregait activities with Min-MOd A for balance/safety. Would benefit from ST SNF to maximize independence and mobility prior to return home.    Follow Up Recommendations SNF    Equipment Recommendations  None recommended by PT    Recommendations for Other Services       Precautions / Restrictions Precautions Precautions: Fall Restrictions Weight Bearing Restrictions: No      Mobility  Bed Mobility Overal bed mobility: Needs Assistance Bed Mobility: Rolling;Sidelying to Sit Rolling: Min guard Sidelying to sit: Min assist;HOB elevated Supine to sit: Min assist Sit to supine: Min guard   General bed mobility comments: Assist to bring LLE to EOB; cues for log roll technique and to use rail for support. Min A to elevate trunk to sitting.  Transfers Overall transfer level: Needs assistance Equipment used: Rolling walker (2 wheeled) Transfers: Sit to/from Stand Sit to Stand: Mod assist;+2 physical assistance Stand pivot transfers: Mod assist;+2 physical assistance;+2 safety/equipment       General transfer comment: Mod A of 2 to boost from EOB with cues for hand placement/technique. Right lateral  lean noted occasionally. Easily distracted. SPT bed to chair with MOd a for balance/safety as pt impulsive and assist with RW management.  Ambulation/Gait                Stairs            Wheelchair Mobility    Modified Rankin (Stroke Patients Only) Modified Rankin (Stroke Patients Only) Pre-Morbid Rankin Score: No symptoms Modified Rankin: Moderately severe disability     Balance Overall balance assessment: Needs assistance Sitting-balance support: Feet supported;Single extremity supported Sitting balance-Leahy Scale: Fair     Standing balance support: During functional activity;Bilateral upper extremity supported Standing balance-Leahy Scale: Poor Standing balance comment: Relient on RW for support. performed marching in standing with UE support and Min A of 2 for balance/safety. Posterior lean and right lateral lean.                              Pertinent Vitals/Pain Pain Assessment: No/denies pain    Home Living Family/patient expects to be discharged to:: Skilled nursing facility Living Arrangements: Spouse/significant other Available Help at Discharge: Family Type of Home: House Home Access: Level entry     Home Layout: One level Home Equipment: Cane - single point;Walker - 2 wheels;Shower seat;Bedside commode Additional Comments: wife's equipment     Prior Function Level of Independence: Independent               Hand Dominance   Dominant Hand: Right    Extremity/Trunk Assessment   Upper Extremity Assessment: Defer to OT evaluation;Generalized weakness  Lower Extremity Assessment: Generalized weakness         Communication   Communication: No difficulties  Cognition Arousal/Alertness: Awake/alert Behavior During Therapy:  (very distracted by all lines.) Overall Cognitive Status: Within Functional Limits for tasks assessed                      General Comments General comments (skin integrity,  edema, etc.): Spouse present during session.    Exercises        Assessment/Plan    PT Assessment Patient needs continued PT services  PT Diagnosis Difficulty walking;Generalized weakness   PT Problem List Decreased strength;Decreased activity tolerance;Decreased balance;Decreased mobility;Decreased safety awareness;Cardiopulmonary status limiting activity  PT Treatment Interventions Balance training;Gait training;Functional mobility training;Therapeutic activities;Therapeutic exercise;Patient/family education   PT Goals (Current goals can be found in the Care Plan section) Acute Rehab PT Goals Patient Stated Goal: get rehab and go home PT Goal Formulation: With patient Time For Goal Achievement: 08/01/15 Potential to Achieve Goals: Fair    Frequency Min 3X/week   Barriers to discharge        Co-evaluation               End of Session Equipment Utilized During Treatment: Gait belt Activity Tolerance: Patient tolerated treatment well Patient left: in chair;with call bell/phone within reach;with chair alarm set;with family/visitor present Nurse Communication: Mobility status;Other (comment) (Instructed tech to have 2 person assist to get back to bed .)         Time: 1353-1420 PT Time Calculation (min) (ACUTE ONLY): 27 min   Charges:   PT Evaluation $Initial PT Evaluation Tier I: 1 Procedure PT Treatments $Therapeutic Activity: 8-22 mins   PT G Codes:        Bricia Taher A Mana Morison 07/18/2015, 2:57 PM Wray Kearns, [place], DPT [phone]

## 2015-07-18 NOTE — Progress Notes (Signed)
Blood completed and patient resp in 30's ,os sats in high 80's, resp labored. Lung sounds with fine crackles throughout. Difficult for patient to talk due to shortness of breath. Oxygen replaced, patient keeps taking it off, when o2  Is off sats stay around 86-88, with oxygen on at 2liters o2 sats 92-94. Baltazar Najjar NP paged to make aware of change in condition. She had ordered po tylenol for patient and also paged regarding need suppository order due to patient is NPO and he failed his swallow test. Zachary George RN

## 2015-07-18 NOTE — Progress Notes (Signed)
Patient condition much improved. Patient 's heart rate ,b/p, o2 sats are WNL now. Patient resting quiet, res[ even and unlabored. Will continue to monitor. [facility] [employer]

## 2015-07-18 NOTE — Progress Notes (Signed)
NP returned call and order received to give lasix 60mg  IV, morphine 2mg  IV, place patient on NRB, and give tylenol supp instead of po. [facility] [employer]

## 2015-07-19 ENCOUNTER — Inpatient Hospital Stay (HOSPITAL_COMMUNITY)
Admission: EM | Admit: 2015-07-19 | Discharge: 2015-07-27 | DRG: 177 | Disposition: A | Discharge status: Skilled Nursing Facility | Payer: Medicare Other | Attending: Internal Medicine | Admitting: Internal Medicine

## 2015-07-19 ENCOUNTER — Encounter (HOSPITAL_COMMUNITY): Payer: Self-pay | Admitting: Emergency Medicine

## 2015-07-19 ENCOUNTER — Emergency Department (HOSPITAL_COMMUNITY): Payer: Medicare Other

## 2015-07-19 DIAGNOSIS — R0602 Shortness of breath: Secondary | ICD-10-CM

## 2015-07-19 DIAGNOSIS — F1010 Alcohol abuse, uncomplicated: Secondary | ICD-10-CM | POA: Diagnosis present

## 2015-07-19 DIAGNOSIS — R509 Fever, unspecified: Secondary | ICD-10-CM | POA: Diagnosis not present

## 2015-07-19 DIAGNOSIS — I469 Cardiac arrest, cause unspecified: Secondary | ICD-10-CM | POA: Diagnosis present

## 2015-07-19 DIAGNOSIS — Z66 Do not resuscitate: Secondary | ICD-10-CM | POA: Diagnosis present

## 2015-07-19 DIAGNOSIS — E785 Hyperlipidemia, unspecified: Secondary | ICD-10-CM | POA: Diagnosis present

## 2015-07-19 DIAGNOSIS — J189 Pneumonia, unspecified organism: Secondary | ICD-10-CM | POA: Diagnosis not present

## 2015-07-19 DIAGNOSIS — R1312 Dysphagia, oropharyngeal phase: Secondary | ICD-10-CM | POA: Diagnosis not present

## 2015-07-19 DIAGNOSIS — R918 Other nonspecific abnormal finding of lung field: Secondary | ICD-10-CM | POA: Diagnosis not present

## 2015-07-19 DIAGNOSIS — E876 Hypokalemia: Secondary | ICD-10-CM | POA: Diagnosis present

## 2015-07-19 DIAGNOSIS — I10 Essential (primary) hypertension: Secondary | ICD-10-CM | POA: Diagnosis not present

## 2015-07-19 DIAGNOSIS — I110 Hypertensive heart disease with heart failure: Secondary | ICD-10-CM | POA: Diagnosis present

## 2015-07-19 DIAGNOSIS — R531 Weakness: Secondary | ICD-10-CM | POA: Diagnosis not present

## 2015-07-19 DIAGNOSIS — M6281 Muscle weakness (generalized): Secondary | ICD-10-CM | POA: Diagnosis not present

## 2015-07-19 DIAGNOSIS — F17200 Nicotine dependence, unspecified, uncomplicated: Secondary | ICD-10-CM | POA: Diagnosis present

## 2015-07-19 DIAGNOSIS — Z931 Gastrostomy status: Secondary | ICD-10-CM

## 2015-07-19 DIAGNOSIS — I82403 Acute embolism and thrombosis of unspecified deep veins of lower extremity, bilateral: Secondary | ICD-10-CM | POA: Diagnosis not present

## 2015-07-19 DIAGNOSIS — J811 Chronic pulmonary edema: Secondary | ICD-10-CM

## 2015-07-19 DIAGNOSIS — J810 Acute pulmonary edema: Secondary | ICD-10-CM | POA: Diagnosis present

## 2015-07-19 DIAGNOSIS — R069 Unspecified abnormalities of breathing: Secondary | ICD-10-CM | POA: Diagnosis not present

## 2015-07-19 DIAGNOSIS — R000 Tachycardia, unspecified: Secondary | ICD-10-CM | POA: Diagnosis present

## 2015-07-19 DIAGNOSIS — I5022 Chronic systolic (congestive) heart failure: Secondary | ICD-10-CM | POA: Diagnosis not present

## 2015-07-19 DIAGNOSIS — G931 Anoxic brain damage, not elsewhere classified: Secondary | ICD-10-CM | POA: Diagnosis not present

## 2015-07-19 DIAGNOSIS — I82511 Chronic embolism and thrombosis of right femoral vein: Secondary | ICD-10-CM | POA: Diagnosis present

## 2015-07-19 DIAGNOSIS — M109 Gout, unspecified: Secondary | ICD-10-CM | POA: Diagnosis present

## 2015-07-19 DIAGNOSIS — I5040 Unspecified combined systolic (congestive) and diastolic (congestive) heart failure: Secondary | ICD-10-CM | POA: Diagnosis present

## 2015-07-19 DIAGNOSIS — Z7952 Long term (current) use of systemic steroids: Secondary | ICD-10-CM

## 2015-07-19 DIAGNOSIS — Z7982 Long term (current) use of aspirin: Secondary | ICD-10-CM | POA: Diagnosis not present

## 2015-07-19 DIAGNOSIS — J9601 Acute respiratory failure with hypoxia: Secondary | ICD-10-CM | POA: Diagnosis not present

## 2015-07-19 DIAGNOSIS — R488 Other symbolic dysfunctions: Secondary | ICD-10-CM | POA: Diagnosis not present

## 2015-07-19 DIAGNOSIS — I69354 Hemiplegia and hemiparesis following cerebral infarction affecting left non-dominant side: Secondary | ICD-10-CM | POA: Diagnosis not present

## 2015-07-19 DIAGNOSIS — R410 Disorientation, unspecified: Secondary | ICD-10-CM | POA: Diagnosis not present

## 2015-07-19 DIAGNOSIS — N179 Acute kidney failure, unspecified: Secondary | ICD-10-CM | POA: Diagnosis not present

## 2015-07-19 DIAGNOSIS — I5042 Chronic combined systolic (congestive) and diastolic (congestive) heart failure: Secondary | ICD-10-CM | POA: Diagnosis not present

## 2015-07-19 DIAGNOSIS — I639 Cerebral infarction, unspecified: Secondary | ICD-10-CM | POA: Diagnosis not present

## 2015-07-19 DIAGNOSIS — R609 Edema, unspecified: Secondary | ICD-10-CM

## 2015-07-19 DIAGNOSIS — I4901 Ventricular fibrillation: Secondary | ICD-10-CM

## 2015-07-19 DIAGNOSIS — D62 Acute posthemorrhagic anemia: Secondary | ICD-10-CM | POA: Diagnosis present

## 2015-07-19 DIAGNOSIS — J690 Pneumonitis due to inhalation of food and vomit: Principal | ICD-10-CM | POA: Diagnosis present

## 2015-07-19 DIAGNOSIS — I69391 Dysphagia following cerebral infarction: Secondary | ICD-10-CM | POA: Diagnosis not present

## 2015-07-19 DIAGNOSIS — R4781 Slurred speech: Secondary | ICD-10-CM | POA: Diagnosis not present

## 2015-07-19 DIAGNOSIS — J80 Acute respiratory distress syndrome: Secondary | ICD-10-CM | POA: Diagnosis not present

## 2015-07-19 DIAGNOSIS — I509 Heart failure, unspecified: Secondary | ICD-10-CM | POA: Diagnosis not present

## 2015-07-19 DIAGNOSIS — I825Z3 Chronic embolism and thrombosis of unspecified deep veins of distal lower extremity, bilateral: Secondary | ICD-10-CM | POA: Diagnosis not present

## 2015-07-19 LAB — URINALYSIS, ROUTINE W REFLEX MICROSCOPIC
GLUCOSE, UA: NEGATIVE mg/dL
Hgb urine dipstick: NEGATIVE
KETONES UR: 15 mg/dL — AB
Leukocytes, UA: NEGATIVE
Nitrite: NEGATIVE
PH: 5 (ref 5.0–8.0)
Protein, ur: 30 mg/dL — AB
Specific Gravity, Urine: 1.023 (ref 1.005–1.030)
Urobilinogen, UA: 1 mg/dL (ref 0.0–1.0)

## 2015-07-19 LAB — COMPREHENSIVE METABOLIC PANEL
ALBUMIN: 1.8 g/dL — AB (ref 3.5–5.0)
ALBUMIN: 2.1 g/dL — AB (ref 3.5–5.0)
ALT: 12 U/L — ABNORMAL LOW (ref 17–63)
ALT: 12 U/L — ABNORMAL LOW (ref 17–63)
ANION GAP: 12 (ref 5–15)
ANION GAP: 10 (ref 5–15)
AST: 27 U/L (ref 15–41)
AST: 37 U/L (ref 15–41)
Alkaline Phosphatase: 58 U/L (ref 38–126)
Alkaline Phosphatase: 72 U/L (ref 38–126)
BILIRUBIN TOTAL: 1.7 mg/dL — AB (ref 0.3–1.2)
BUN: 22 mg/dL — ABNORMAL HIGH (ref 6–20)
BUN: 22 mg/dL — AB (ref 6–20)
CALCIUM: 8.5 mg/dL — AB (ref 8.9–10.3)
CO2: 22 mmol/L (ref 22–32)
CO2: 21 mmol/L — ABNORMAL LOW (ref 22–32)
CREATININE: 1.68 mg/dL — AB (ref 0.61–1.24)
Calcium: 8 mg/dL — ABNORMAL LOW (ref 8.9–10.3)
Chloride: 107 mmol/L (ref 101–111)
Chloride: 112 mmol/L — ABNORMAL HIGH (ref 101–111)
Creatinine, Ser: 1.78 mg/dL — ABNORMAL HIGH (ref 0.61–1.24)
GFR calc Af Amer: 48 mL/min — ABNORMAL LOW (ref >60)
GFR calc non Af Amer: 38 mL/min — ABNORMAL LOW (ref >60)
GFR calc non Af Amer: 41 mL/min — ABNORMAL LOW (ref >60)
GFR, EST AFRICAN AMERICAN: 44 mL/min — AB (ref >60)
GLUCOSE: 108 mg/dL — AB (ref 65–99)
Glucose, Bld: 138 mg/dL — ABNORMAL HIGH (ref 65–99)
POTASSIUM: 2.9 mmol/L — AB (ref 3.5–5.1)
POTASSIUM: 4.8 mmol/L (ref 3.5–5.1)
SODIUM: 141 mmol/L (ref 135–145)
SODIUM: 143 mmol/L (ref 135–145)
TOTAL PROTEIN: 5.5 g/dL — AB (ref 6.5–8.1)
Total Bilirubin: 0.8 mg/dL (ref 0.3–1.2)
Total Protein: 6.8 g/dL (ref 6.5–8.1)

## 2015-07-19 LAB — CBC WITH DIFFERENTIAL/PLATELET
BASOS ABS: 0 10*3/uL (ref 0.0–0.1)
BASOS PCT: 1 %
Basophils Absolute: 0.1 10*3/uL (ref 0.0–0.1)
Basophils Relative: 0 %
EOS ABS: 0.1 10*3/uL (ref 0.0–0.7)
EOS ABS: 0 10*3/uL (ref 0.0–0.7)
EOS PCT: 0 %
Eosinophils Relative: 1 %
HCT: 22.6 % — ABNORMAL LOW (ref 39.0–52.0)
HCT: 27.8 % — ABNORMAL LOW (ref 39.0–52.0)
Hemoglobin: 8 g/dL — ABNORMAL LOW (ref 13.0–17.0)
Hemoglobin: 9.6 g/dL — ABNORMAL LOW (ref 13.0–17.0)
LYMPHS ABS: 1.3 10*3/uL (ref 0.7–4.0)
LYMPHS ABS: 0.8 10*3/uL (ref 0.7–4.0)
Lymphocytes Relative: 9 %
Lymphocytes Relative: 6 %
MCH: 31 pg (ref 26.0–34.0)
MCH: 30.2 pg (ref 26.0–34.0)
MCHC: 35.4 g/dL (ref 30.0–36.0)
MCHC: 34.5 g/dL (ref 30.0–36.0)
MCV: 87.6 fL (ref 78.0–100.0)
MCV: 87.4 fL (ref 78.0–100.0)
MONO ABS: 0.4 10*3/uL (ref 0.1–1.0)
Monocytes Absolute: 1.3 10*3/uL — ABNORMAL HIGH (ref 0.1–1.0)
Monocytes Relative: 9 %
Monocytes Relative: 3 %
NEUTROS ABS: 11.5 10*3/uL — AB (ref 1.7–7.7)
Neutro Abs: 11.5 10*3/uL — ABNORMAL HIGH (ref 1.7–7.7)
Neutrophils Relative %: 81 %
Neutrophils Relative %: 90 %
PLATELETS: 216 10*3/uL (ref 150–400)
Platelets: 212 10*3/uL (ref 150–400)
RBC: 2.58 MIL/uL — ABNORMAL LOW (ref 4.22–5.81)
RBC: 3.18 MIL/uL — AB (ref 4.22–5.81)
RDW: 15.5 % (ref 11.5–15.5)
RDW: 15.4 % (ref 11.5–15.5)
WBC: 14.2 10*3/uL — ABNORMAL HIGH (ref 4.0–10.5)
WBC: 12.8 10*3/uL — AB (ref 4.0–10.5)

## 2015-07-19 LAB — I-STAT CG4 LACTIC ACID, ED
LACTIC ACID, VENOUS: 1.2 mmol/L (ref 0.5–2.0)
Lactic Acid, Venous: 1.68 mmol/L (ref 0.5–2.0)

## 2015-07-19 LAB — GLUCOSE, CAPILLARY
GLUCOSE-CAPILLARY: 104 mg/dL — AB (ref 65–99)
Glucose-Capillary: 109 mg/dL — ABNORMAL HIGH (ref 65–99)

## 2015-07-19 LAB — MAGNESIUM
MAGNESIUM: 1.7 mg/dL (ref 1.7–2.4)
Magnesium: 1.8 mg/dL (ref 1.7–2.4)

## 2015-07-19 LAB — I-STAT TROPONIN, ED: Troponin i, poc: 0.08 ng/mL (ref 0.00–0.08)

## 2015-07-19 LAB — URINE MICROSCOPIC-ADD ON

## 2015-07-19 LAB — BRAIN NATRIURETIC PEPTIDE: B Natriuretic Peptide: 3064 pg/mL — ABNORMAL HIGH (ref 0.0–100.0)

## 2015-07-19 LAB — PHOSPHORUS: PHOSPHORUS: 3 mg/dL (ref 2.5–4.6)

## 2015-07-19 MED ORDER — SODIUM CHLORIDE 0.9 % IJ SOLN
3.0000 mL | Freq: Two times a day (BID) | INTRAMUSCULAR | Status: DC
  Administered 2015-07-20 – 2015-07-21 (×3): 3 mL via INTRAVENOUS

## 2015-07-19 MED ORDER — PREDNISONE 20 MG PO TABS: 40.0000 mg | ORAL_TABLET | Freq: Every day | ORAL | Status: DC

## 2015-07-19 MED ORDER — PREDNISONE 20 MG PO TABS
40.0000 mg | ORAL_TABLET | Freq: Every day | ORAL | Status: DC
  Administered 2015-07-20 – 2015-07-23 (×4): 40 mg
  Filled 2015-07-19 (×6): qty 2

## 2015-07-19 MED ORDER — SODIUM CHLORIDE 0.9 % IJ SOLN: 3.0000 mL | INTRAMUSCULAR | Status: DC | PRN

## 2015-07-19 MED ORDER — METOPROLOL TARTRATE 25 MG PO TABS
25.0000 mg | ORAL_TABLET | Freq: Two times a day (BID) | ORAL | Status: DC
Start: 2015-07-19 — End: 2015-07-19
  Administered 2015-07-19: 25 mg via ORAL
  Filled 2015-07-19: qty 1

## 2015-07-19 MED ORDER — IPRATROPIUM BROMIDE 0.02 % IN SOLN
0.5000 mg | Freq: Four times a day (QID) | RESPIRATORY_TRACT | Status: DC
  Administered 2015-07-20: 0.5 mg via RESPIRATORY_TRACT
  Filled 2015-07-19: qty 2.5

## 2015-07-19 MED ORDER — SODIUM CHLORIDE 0.9 % IV SOLN
1000.0000 mL | INTRAVENOUS | Status: DC
  Administered 2015-07-19: 1000 mL via INTRAVENOUS

## 2015-07-19 MED ORDER — PROSIGHT PO TABS
1.0000 | ORAL_TABLET | Freq: Every day | ORAL | Status: DC
  Administered 2015-07-19: 1 via ORAL
  Filled 2015-07-19: qty 1

## 2015-07-19 MED ORDER — ENOXAPARIN SODIUM 40 MG/0.4ML ~~LOC~~ SOLN
40.0000 mg | SUBCUTANEOUS | Status: DC
  Administered 2015-07-19: 40 mg via SUBCUTANEOUS
  Filled 2015-07-19 (×2): qty 0.4

## 2015-07-19 MED ORDER — PANTOPRAZOLE SODIUM 40 MG PO TBEC
40.0000 mg | DELAYED_RELEASE_TABLET | Freq: Every day | ORAL | Status: DC
  Administered 2015-07-19: 40 mg via ORAL
  Filled 2015-07-19: qty 1

## 2015-07-19 MED ORDER — LEVALBUTEROL HCL 0.63 MG/3ML IN NEBU
1.2500 mg | INHALATION_SOLUTION | Freq: Four times a day (QID) | RESPIRATORY_TRACT | Status: DC
  Administered 2015-07-20: 1.25 mg via RESPIRATORY_TRACT
  Filled 2015-07-19: qty 6

## 2015-07-19 MED ORDER — METOPROLOL TARTRATE 25 MG PO TABS
25.0000 mg | ORAL_TABLET | Freq: Two times a day (BID) | ORAL | Status: DC
  Administered 2015-07-19 – 2015-07-26 (×15): 25 mg
  Filled 2015-07-19 (×20): qty 1

## 2015-07-19 MED ORDER — METOPROLOL TARTRATE 25 MG PO TABS: 25.0000 mg | ORAL_TABLET | Freq: Two times a day (BID) | ORAL | Status: DC

## 2015-07-19 MED ORDER — POTASSIUM CHLORIDE 20 MEQ/15ML (10%) PO SOLN
60.0000 meq | Freq: Once | ORAL | Status: AC
  Administered 2015-07-19: 60 meq via ORAL
  Filled 2015-07-19: qty 45

## 2015-07-19 MED ORDER — FOLIC ACID 1 MG PO TABS
1.0000 mg | ORAL_TABLET | Freq: Every day | ORAL | Status: DC
  Administered 2015-07-19: 1 mg via ORAL
  Filled 2015-07-19: qty 1

## 2015-07-19 MED ORDER — VANCOMYCIN HCL IN DEXTROSE 750-5 MG/150ML-% IV SOLN
750.0000 mg | Freq: Two times a day (BID) | INTRAVENOUS | Status: DC
  Administered 2015-07-19 – 2015-07-20 (×2): 750 mg via INTRAVENOUS
  Filled 2015-07-19 (×3): qty 150

## 2015-07-19 MED ORDER — ASPIRIN 81 MG PO TBEC: 81.0000 mg | DELAYED_RELEASE_TABLET | Freq: Every day | ORAL | Status: DC

## 2015-07-19 MED ORDER — DEXTROSE 5 % IV SOLN
2.0000 g | Freq: Two times a day (BID) | INTRAVENOUS | Status: DC
  Administered 2015-07-19 – 2015-07-20 (×2): 2 g via INTRAVENOUS
  Filled 2015-07-19 (×4): qty 2

## 2015-07-19 MED ORDER — IPRATROPIUM BROMIDE 0.02 % IN SOLN
0.5000 mg | Freq: Once | RESPIRATORY_TRACT | Status: AC
  Administered 2015-07-20: 0.5 mg via RESPIRATORY_TRACT
  Filled 2015-07-19: qty 2.5

## 2015-07-19 MED ORDER — THIAMINE HCL 100 MG PO TABS: 100.0000 mg | ORAL_TABLET | Freq: Every day | ORAL | Status: DC

## 2015-07-19 MED ORDER — PROSIGHT PO TABS
1.0000 | ORAL_TABLET | Freq: Every day | ORAL | Status: DC
Start: 2015-07-19 — End: 2024-02-29

## 2015-07-19 MED ORDER — MAGNESIUM OXIDE 400 (241.3 MG) MG PO TABS
400.0000 mg | ORAL_TABLET | Freq: Once | ORAL | Status: AC
  Administered 2015-07-19: 400 mg via ORAL
  Filled 2015-07-19: qty 1

## 2015-07-19 MED ORDER — VITAMIN B-1 100 MG PO TABS
100.0000 mg | ORAL_TABLET | Freq: Every day | ORAL | Status: DC
  Administered 2015-07-19 – 2015-07-23 (×5): 100 mg via ORAL
  Filled 2015-07-19 (×6): qty 1

## 2015-07-19 MED ORDER — PANTOPRAZOLE SODIUM 40 MG PO TBEC: 40.0000 mg | DELAYED_RELEASE_TABLET | Freq: Every day | ORAL | Status: DC

## 2015-07-19 MED ORDER — ASPIRIN EC 81 MG PO TBEC
81.0000 mg | DELAYED_RELEASE_TABLET | Freq: Every day | ORAL | Status: DC
  Administered 2015-07-19: 81 mg via ORAL
  Filled 2015-07-19: qty 1

## 2015-07-19 MED ORDER — POTASSIUM CHLORIDE CRYS ER 20 MEQ PO TBCR: 60.0000 meq | EXTENDED_RELEASE_TABLET | Freq: Once | ORAL | Status: DC

## 2015-07-19 MED ORDER — PRO-STAT SUGAR FREE PO LIQD
30.0000 mL | Freq: Two times a day (BID) | ORAL | Status: DC
  Administered 2015-07-19 – 2015-07-27 (×16): 30 mL
  Filled 2015-07-19 (×18): qty 30

## 2015-07-19 MED ORDER — JEVITY 1.2 CAL PO LIQD: 1000.0000 mL | ORAL | Status: DC

## 2015-07-19 MED ORDER — PRO-STAT SUGAR FREE PO LIQD: 30.0000 mL | Freq: Two times a day (BID) | ORAL | Status: DC

## 2015-07-19 MED ORDER — LEVALBUTEROL HCL 0.63 MG/3ML IN NEBU: 1.2500 mg | INHALATION_SOLUTION | Freq: Four times a day (QID) | RESPIRATORY_TRACT | Status: DC

## 2015-07-19 MED ORDER — FOLIC ACID 1 MG PO TABS: 1.0000 mg | ORAL_TABLET | Freq: Every day | ORAL | Status: DC

## 2015-07-19 MED ORDER — LEVALBUTEROL HCL 1.25 MG/0.5ML IN NEBU
1.2500 mg | INHALATION_SOLUTION | Freq: Once | RESPIRATORY_TRACT | Status: DC
  Filled 2015-07-19 (×2): qty 0.5

## 2015-07-19 MED ORDER — VITAMIN B-1 100 MG PO TABS
100.0000 mg | ORAL_TABLET | Freq: Every day | ORAL | Status: DC
  Administered 2015-07-19: 100 mg via ORAL
  Filled 2015-07-19: qty 1

## 2015-07-19 MED ORDER — SODIUM CHLORIDE 0.9 % IV SOLN: 250.0000 mL | INTRAVENOUS | Status: DC | PRN

## 2015-07-19 NOTE — ED Notes (Signed)
Chest xray at bedside.

## 2015-07-19 NOTE — ED Notes (Signed)
Report attempted x 1

## 2015-07-19 NOTE — Discharge Summary (Signed)
Physician Discharge Summary  Onofrio Tessendorf [id number] DOB: 02/22/1951 DOA: 07/08/2015  PCP: No primary care provider on file.  Admit date: 07/08/2015 Discharge date: 07/19/2015  Time spent: 40 minutes  Recommendations for Outpatient Follow-up:  Anoxic brain injury vs CVA - EEG w/o seizure activity -Brain MRI; shows acute/subacute stroke see results below -Patient be transferred to [facility] for neuro Rehabilitation -Establish care with [facility] in 3 months  Acute Respiratory Failure Post Arrest -Resolved  Tracheal perforation (ill assume traumatic intubation) -blood from NG tube when removed -S/P IR PEG placement. Restart Jevity at 34ml/hr, would not advance for the next 24-48 hours secondary to positive N/V today. -Establish care with [facility] in 4 weeks follow-up tracheal perforation  CAP -Completed 10 days of tx  Post V fib arrest  -S/p Hypothermia protocol  H/o DVT -Concern ischemia as cause, patient currently cannot be anticoagulated secondary to tracheal tear  Chronic Systolic CHF -[id number] CVP= 8 mmHg -Transfuse for hemoglobin<8 -10/20 transfuse 1 unit PRBC -Start metoprolol 25 mg BID per tube -Strict in and out since admission + 275ml -Daily a.m. Weight; 10/22 bed weight= 77.7 kg  HTN -See systolic CHF -Establish care [facility] in 3 weeks hypertension in 3 weeks, HTN, acute renal failure, thrombocytopenia   Acute Renal Failure/ ATN post arrest -Continues to improve.  -Follow closely  Hypernatremia -Resolved -See acute renal failure  Hypokalemia -Potassium goal> 4 -Potassium IV 60 mEq  Hypomagnesemia -Magnesium goal>2 -Magnesium oxide 400 mg 1  Nutrition -10/21 Passed MBS; dysphagia 3 fluid in -10/22 S/P PEG.;  reduce Jevity; goal rate 30 ml/h, until patient tolerates PO/PEG feeds   Anemia  -Acute blood loss from tracheal tear and placement of PEG tube -stable -See chronic  CHF  Thrombocytopenia (ETOH, dilution) - resolved   ETOH abuse  -Continue CIWA protocol use  Gout flare -Uric acid goal<6 -10/18 uric acid= 9.4 -Prednisone 40 mg daily for 7 days (will allow patients renal function to improve)       Discharge Diagnoses:  Principal Problem:   Cardiac arrest [facility]) Active Problems:   Hyperlipidemia   HYPERTENSION, BENIGN SYSTEMIC   Cardiac arrest with ventricular fibrillation (HCC)   History of ETT   Acute respiratory failure with hypoxemia (HCC)   Pulmonary edema   Tracheal perforation   CAP (community acquired pneumonia)   Chronic systolic CHF (congestive heart failure) (HCC)   Acute renal failure (HCC)   Alcohol abuse   Anoxic brain injury ([place])   Thrombocytopenia ([place])   Dysphagia   Cerebrovascular accident (CVA) ([place])   Mental status change   Primary gout   Discharge Condition: stable  Diet recommendation: Jevity 30 ml/hr via PEG+ dysphagia 3 liquids  Filed Weights   07/17/15 0400 07/18/15 0442 07/19/15 0431  Weight: 78.1 kg (172 lb 2.9 oz) 79.017 kg (174 lb 3.2 oz) 77.792 kg (171 lb 8 oz)    History of present illness:  64 yo BM PMHx HTN, HLD, Gout, ETOH Abuse  Who was noted to have respiratory distress and odd resp sounds per wife 1 min after laying back down early am 10/11 and EMS was activated. Wife attempted cpr at home. He was down for approximately 10 minutes AND when EMS he was in V fib. Shocked x 1 and epi x1 with return of circulation. Intubated and transported to [facility] ED.  This patient was treated for tracheal perforation secondary to, traumatic intubation, compounded by acute CVA/Anoxic Brain injury and CHF. Patient with  extreme deconditioning and residual left side hemiparesis requiring neuro rehabilitation.    Consultants: Dr.Wesam Kathryne [facility] M)   Procedure/Significant Events: 10/11 ct head - atrophy mild, neg acute 10/11 ct angio chest - There is extensive air in the neck and upper mediastinal  region. Air is noted just anterior to the trachea in the lower neck region 10/11 EEG - markedly abnormal due to severe attenuation of background rhythms. No seizure activity. 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 -Minimal T2 changes w/i brainstem suggesting microvascular disease. 10/20 S/P IR GASTROSTOMY TUBE placed 10/20 transfuse 1 unit PRBC  Culture 10/11 MRSA by PCR negative 10/18 blood right arm/left hand NGTD   Antibiotics: Unasyn 10/11>>10/16 Rocephin 10/16>>> stopped 10/19 Cefazolin 10/20 one dose    Discharge Exam: Filed Vitals:   07/18/15 2000 07/19/15 0027 07/19/15 0431 07/19/15 0812  BP: 117/65 134/70 128/71   Pulse: 107 110 110   Temp: 100.1 F (37.8 C) 99.2 F (37.3 C) 98.4 F (36.9 C) 99.4 F (37.4 C)  TempSrc: Oral Oral Oral Oral  Resp: 24 30 21    Height:      Weight:   77.792 kg (171 lb 8 oz)   SpO2: 96% 93% 97% 95%    General: A/O  4, No acute respiratory distress Eyes: ,negative scleral hemorrhage ENT: Negative Runny nose, negative gingival bleeding, Neck: Negative scars, masses, torticollis, lymphadenopathy, JVD, neck emphysema resolved  Lungs: Clear to auscultation bilaterally without wheezes or crackles Cardiovascular: Regular rate and rhythm without murmur gallop or rub normal S1 and S2 Abdomen:negative abdominal pain, nondistended, positive soft, bowel sounds, no rebound, no ascites, no appreciable mass  Discharge Instructions     Medication List    ASK your doctor about these medications        AZOR 10-40 MG tablet  Generic drug:  amLODipine-olmesartan  Take 1 tablet by mouth daily.       Allergies  Allergen Reactions  . Hydrochlorothiazide      REACTION: precipitates gout  . Pseudoephedrine     REACTION: rash      The results of significant diagnostics from this hospitalization (including imaging, microbiology, ancillary and laboratory) are listed below for reference.    Significant Diagnostic Studies: Ct Head Wo Contrast  07/08/2015  CLINICAL DATA:  Status post cardiac arrest. EXAM: CT HEAD WITHOUT CONTRAST TECHNIQUE: Contiguous axial images were obtained from the base of the skull through the vertex without intravenous contrast. COMPARISON:  None. FINDINGS: No mass effect or midline shift. No evidence of acute intracranial hemorrhage, or infarction. No abnormal extra-axial fluid collections. Gray-white matter differentiation is normal. Basal cisterns are preserved. There is mild brain parenchymal atrophy and chronic small vessel disease changes. No depressed skull fractures. Visualized paranasal sinuses and mastoid air cells are not opacified. IMPRESSION: No acute intracranial abnormality. Mild brain parenchymal atrophy and chronic microvascular disease. Electronically Signed   By: Fidela Salisbury M.D.   On: 07/08/2015 07:59   Ct Angio Chest Pe W/cm &/or Wo Cm  07/08/2015  CLINICAL DATA:  Hypoxia.  Status post cardiac arrest. EXAM: CT ANGIOGRAPHY CHEST WITH CONTRAST TECHNIQUE: Multidetector CT imaging of the chest was performed using the standard protocol during bolus administration of intravenous contrast. Multiplanar CT image reconstructions and MIPs were obtained to evaluate  the vascular anatomy. CONTRAST:  67mL OMNIPAQUE IOHEXOL 350 MG/ML SOLN COMPARISON:  Chest radiograph July 08, 2015 FINDINGS: There is no demonstrable pulmonary embolus. There is no appreciable thoracic aortic aneurysm. There is patchy atherosclerotic disease in the aortic arch region. Visualized great vessels show mild atherosclerotic change. There is extensive soft tissue air in the visualized lower neck tracking into the left superior mediastinum.  There is an endotracheal tube present. Air is seen tracking slightly superior to the endotracheal tube in the lower neck region at the level of the thyroid, raising concern for potential tracheal injury. There is bibasilar atelectatic change. Areas of patchy ground-glass opacity bilaterally most likely represent early pulmonary edema. No pneumothorax. Visualized thyroid appears unremarkable. No adenopathy is appreciable. There are scattered foci of coronary artery calcification. Pericardium is not thickened. In the visualized upper abdomen, there are no appreciable lesions. There are no blastic or lytic bone lesions. There is degenerative change in the thoracic spine. Review of the MIP images confirms the above findings. IMPRESSION: There is extensive air in the neck and upper mediastinal region. Air is noted just anterior to the trachea in the lower neck region. Given the history and the presence of the endotracheal tube, question traumatic intubation with tracheal injury. No demonstrable pulmonary embolus.  No pneumothorax. Evidence of early pulmonary edema.  Bibasilar atelectatic change. No adenopathy. Critical Value/emergent results were called by telephone at the time of interpretation on 07/08/2015 at 8:01 am to Dr. Pryor Curia , who verbally acknowledged these results. Electronically Signed   By: Lowella Grip III M.D.   On: 07/08/2015 08:01   Mr Brain Wo Contrast  07/17/2015  CLINICAL DATA:  Mental status change. Recent cardiac arrest with ventricular fibrillation. Question the knocked brain injury versus CVA. EXAM: MRI HEAD WITHOUT CONTRAST TECHNIQUE: Multiplanar, multiecho pulse sequences of the brain and surrounding structures were obtained without intravenous contrast. COMPARISON:  CT head without contrast 07/08/2015 FINDINGS: A linear subcortical white matter infarct in the left frontal lobe measures 14 mm in cephalo caudad dimension. There is no acute hemorrhage or mass lesion. Minimal  supratentorial T2 changes are within normal limits for age. There is some white matter change within the brainstem. The internal auditory canals are within normal limits. Flow is present in the major intracranial arteries. Globes and orbits are intact. Fluid is present in the nasopharynx. Mild mucosal thickening is present in the maxillary sinuses and ethmoid air cells bilaterally. There is some fluid in the mastoid air cells bilaterally. No obstructing nasopharyngeal lesion is present. The ventricles are of normal size. No significant extra-axial fluid collection is present. IMPRESSION: 1. Acute/subacute linear infarct in the subcortical white matter of the left frontal lobe. 2. No other significant supratentorial white matter disease. 3. Minimal T2 changes within the brainstem suggesting microvascular disease. 4. Fluid layering in the in nasopharynx and mild sinus disease as described. This is likely related to the patient's hospitalization and previous intubation. Patient was extubated 4 days ago. Electronically Signed   By: San Morelle M.D.   On: 07/17/2015 16:47   Ir Gastrostomy Tube Mod Sed  07/17/2015  CLINICAL DATA:  64 year old male with a history of dysphagia. History of cerebral vascular accident. He has been referred for placement of percutaneous gastrostomy tube. EXAM: PERCUTANEOUS GASTROSTOMY FLUOROSCOPY TIME:  1 minutes 30 seconds MEDICATIONS AND MEDICAL HISTORY: Versed 1.0 mg, Fentanyl 50 mcg. ANESTHESIA/SEDATION: Moderate sedation time: 10 minutes CONTRAST:  10 cc enteric through the tube PROCEDURE: The procedure, risks, benefits, and  alternatives were explained to the patient's family. Questions regarding the procedure were encouraged and answered. The patient understands and consents to the procedure. The epigastrium was prepped with Betadine in a sterile fashion, and a sterile drape was applied covering the operative field. A sterile gown and sterile gloves were used for the  procedure. A 5-French orogastric tube is placed under fluoroscopic guidance. Scout imaging of the abdomen confirms barium within the transverse colon. The stomach was distended with gas. Under fluoroscopic guidance, an 18 gauge needle was utilized to puncture the anterior wall of the body of the stomach. An Amplatz wire was advanced through the needle passing a T fastener into the lumen of the stomach. The T fastener was secured for gastropexy. A 9-French sheath was inserted. A snare was advanced through the 9-French sheath. A Britta Mccreedy was advanced through the orogastric tube. It was snared then pulled out the oral cavity, pulling the snare, as well. The leading edge of the gastrostomy was attached to the snare. It was then pulled down the esophagus and out the percutaneous site. It was secured in place. Contrast was injected. No complication. COMPLICATIONS: None FINDINGS: The image demonstrates placement of a 20-French pull-through type gastrostomy tube into the body of the stomach. IMPRESSION: Status post placement of 20 French pull-through percutaneous gastrostomy tube. Signed, Dulcy Fanny. Earleen Newport, DO [facility] Electronically Signed   By: Corrie Mckusick D.O.   On: 07/17/2015 16:30   Dg Chest Port 1 View  07/18/2015  CLINICAL DATA:  Hypoxia. EXAM: PORTABLE CHEST 1 VIEW COMPARISON:  07/13/2015 FINDINGS: Left internal jugular central venous catheter tip in the proximal SVC. Cardiomediastinal contours are unchanged. Persistent bilateral perihilar opacities suspicious for pulmonary edema, with mild improvement from the left and unchanged appearance in the right allowing for differences in technique. Question layering pleural effusions, with retrocardiac opacity. No pneumothorax. Overlying artifacts project over the upper hemithorax. IMPRESSION: Minimal improved aeration of the left perihilar region, with persistent perihilar opacities suspicious for pulmonary  edema. Probable pleural effusions, retrocardiac opacity likely effusion and atelectasis. Electronically Signed   By: Jeb Levering M.D.   On: 07/18/2015 02:29   Dg Chest Port 1 View  07/13/2015  CLINICAL DATA:  Acute respiratory failure with hypoxemia EXAM: PORTABLE CHEST 1 VIEW COMPARISON:  1 day prior FINDINGS: Left internal jugular line tip at mid SVC. Midline trachea. Upper normal heart size. Probable layering bilateral pleural effusions. No pneumothorax. Relatively diffuse interstitial and airspace disease. Slightly progressive, especially on the left. IMPRESSION: Slight worsening aeration. At least partially felt to be due to pulmonary edema. Multifocal infection or aspiration cannot be excluded. Probable layering bilateral pleural effusions. Electronically Signed   By: Abigail Miyamoto M.D.   On: 07/13/2015 07:59   Dg Chest Port 1 View  07/12/2015  CLINICAL DATA:  64 year old male with pulmonary edema. EXAM: PORTABLE CHEST 1 VIEW COMPARISON:  Chest x-ray 07/11/2015. FINDINGS: Patient has been extubated. Nasogastric tube has been removed. Left internal jugular central venous catheter with tip terminating in the mid superior vena cava. Lung volumes are slightly low. Worsening airspace consolidation throughout the lungs bilaterally (asymmetrically distributed), most severe in the right mid to lower lung. No definite pleural effusions. Cephalization of the pulmonary vasculature. Heart size is normal. The patient is rotated to the left on today's exam, resulting in distortion of the mediastinal contours and reduced diagnostic sensitivity and specificity for mediastinal pathology. IMPRESSION: 1. Support apparatus, as above. 2. Worsening asymmetrically distributed airspace consolidation  in the lungs bilaterally, most severe in the right lower lobe, concerning for developing multilobar pneumonia, potentially sequela of recent aspiration. 3. Given the cephalization of pulmonary vasculature, some of these  findings could be related to developing pulmonary edema, however, this is not favored given the normal cardiac size. Electronically Signed   By: Vinnie Langton M.D.   On: 07/12/2015 08:03   Dg Chest Port 1 View  07/11/2015  CLINICAL DATA:  Cardiac arrest.  Hypertension . EXAM: PORTABLE CHEST 1 VIEW COMPARISON:  07/08/2015. FINDINGS: Endotracheal tube, NG tube, left IJ line in stable position. Mild infiltrate left lung base. Small left pleural effusion. Mild cardiomegaly. No pulmonary venous congestion No pneumothorax IMPRESSION: 1. Lines and tubes in stable position. 2. Mild left base infiltrate and left pleural effusion. 3. Mild cardiomegaly.  No pulmonary venous congestion . Electronically Signed   By: Marcello Moores  Register   On: 07/11/2015 07:21   Dg Chest Port 1 View  07/08/2015  CLINICAL DATA:  Central line placement.  Ventilator.  Post CPR EXAM: PORTABLE CHEST 1 VIEW COMPARISON:  07/08/2015 FINDINGS: Endotracheal tube 3 cm above the carina unchanged. Left jugular central venous catheter tip in the SVC in good position. No pneumothorax. NG tube in the stomach. Improved aeration of the lungs. Lungs are now clear without infiltrate or atelectasis. No effusion. IMPRESSION: Endotracheal tube in good position. Central venous catheter in good position. Improved aeration.  Lungs are clear. Electronically Signed   By: Franchot Gallo M.D.   On: 07/08/2015 09:26   Dg Chest Portable 1 View  07/08/2015  CLINICAL DATA:  Post code.  Assess endotracheal tube. EXAM: PORTABLE CHEST 1 VIEW COMPARISON:  None. FINDINGS: Endotracheal tube with tip measuring 3 cm above the carinal. Additional catheter projected over the left side of the neck is of nonspecific etiology and may be external. Shallow inspiration. Normal heart size and pulmonary vascularity. No focal airspace disease or consolidation in the lungs. No pneumothorax. Visualized ribs appear intact. Degenerative changes in the spine and shoulders. IMPRESSION:  Endotracheal tube tip measures 3 cm above the carinal. No evidence of active pulmonary disease. Electronically Signed   By: Lucienne Capers M.D.   On: 07/08/2015 06:57   Dg Abd Portable 1v  07/14/2015  CLINICAL DATA:  Repositioning of feeding tube EXAM: PORTABLE ABDOMEN - 1 VIEW COMPARISON:  Radiograph 07/14/2015 FINDINGS: Feeding tube with tip in the second portion duodenum. IMPRESSION: Feeding tube with weighted tip in second portion duodenum. Electronically Signed   By: Suzy Bouchard M.D.   On: 07/14/2015 15:42   Dg Abd Portable 1v  07/14/2015  CLINICAL DATA:  Feeding tube placement. EXAM: PORTABLE ABDOMEN - 1 VIEW COMPARISON:  None. FINDINGS: The tip of the feeding tube is in the fundus of the stomach. There is a loop of the feeding tube which extends into the distal stomach. Bowel gas pattern is normal. IMPRESSION: Feeding tube in the stomach as described. Electronically Signed   By: Lorriane Shire M.D.   On: 07/14/2015 13:14   Dg Swallowing Func-speech Pathology  07/18/2015  Objective Swallowing Evaluation:   Patient Details Name: Mitchell [name] MRN: [id number] Date of Birth: [birth year] Today's Date: 07/18/2015 Time: SLP Start Time (ACUTE ONLY): 1145-SLP Stop Time (ACUTE ONLY): 1210 SLP Time Calculation (min) (ACUTE ONLY): 25 min Past Medical History: Past Medical History Diagnosis Date . Hypertension  . Alcohol abuse  . Gout  . Hyperlipemia  Past Surgical History: No past surgical history on file. HPI: Other Pertinent Information: Pt  is a 64 yo AAM with incomplete pmh but known HTN, Gout, Hyperlipidemia, ETOH abuse who was noted to have respiratory distress and odd resp sounds per wife 1 min after laying back down early am 10/11 and EMS was activated. Wife attempted cpr at home. He was down for approximately 10 minutes AND when EMS he was in V fib. Shocked x 1 and epi x1 with return of circulation. Intubated and transported to [facility] ED. He was taken for CT of chest and head to rule out PE and or  Neurological injury. Pt with possible tracheal/laryngeal/esophageal/pharyngeal injury from emergent intubation. Intubated 10/11-10/14. ENT perfomed laryngoscopy however unable to visualize larynx due to copious secretions.  No Data Recorded Assessment / Plan / Recommendation CHL IP CLINICAL IMPRESSIONS 07/18/2015 Therapy Diagnosis Mild oral phase dysphagia;Mild pharyngeal phase dysphagia Clinical Impression Pt presents with a mild oral dysphagia with anterior mastication and slow bolus formation and transit with solids. Oropharyngeal phase also mildy impaired with a mild delay in swallow iniatition with resuting trace penetration before/during the swallow with large consecutive sips. Pt also with mild weakness of the hyolaryngeal mechanism with mild residuals post swallow. Chin tuck does not aid in transit, though cued effortful swallow shows potential for improvement. Recommend pt initiate a dys 3 (mechanical soft) diet with thin liquids, being careful to take one single sip at a time, avoid straws and take pills whole in puree. Pt was able to repeat these precautions and return demonstrate independently with water after session complete. SLP will f/u for tolerance.    CHL IP TREATMENT RECOMMENDATION 07/18/2015 Treatment Recommendations Therapy as outlined in treatment plan below   CHL IP DIET RECOMMENDATION 07/18/2015 SLP Diet Recommendations Dysphagia 3 (Mech soft);Thin Liquid Administration via (None) Medication Administration Whole meds with puree Compensations Slow rate;Small sips/bites Postural Changes and/or Swallow Maneuvers (None)   CHL IP OTHER RECOMMENDATIONS 07/18/2015 Recommended Consults (None) Oral Care Recommendations Oral care BID Other Recommendations (None)   No flowsheet data found.  CHL IP FREQUENCY AND DURATION 07/18/2015 Speech Therapy Frequency (ACUTE ONLY) min 2x/week Treatment Duration 2 weeks   Pertinent Vitals/Pain NA  SLP Swallow Goals No flowsheet data found. No flowsheet data found.    CHL IP REASON FOR REFERRAL 07/18/2015 Reason for Referral Objectively evaluate swallowing function         Herbie Baltimore, MA CCC-SLP [id number] Lynann Beaver 07/18/2015, 1:56 PM    Microbiology: Recent Results (from the past 240 hour(s))  Culture, blood (routine x 2)     Status: None (Preliminary result)   Collection Time: 07/15/15  8:24 PM  Result Value Ref Range Status   Specimen Description BLOOD RIGHT ARM  Final   Special Requests IN PEDIATRIC BOTTLE 1.5CC  Final   Culture NO GROWTH 4 DAYS  Final   Report Status PENDING  Incomplete  Culture, blood (routine x 2)     Status: None (Preliminary result)   Collection Time: 07/15/15  8:41 PM  Result Value Ref Range Status   Specimen Description BLOOD LEFT HAND  Final   Special Requests IN PEDIATRIC BOTTLE [facility]  Final   Culture NO GROWTH 4 DAYS  Final   Report Status PENDING  Incomplete     Labs: Basic Metabolic Panel:  Recent Labs Lab 07/15/15 0410 07/16/15 0517 07/17/15 0345 07/18/15 0532 07/19/15 0624  NA 146* 144 146* 142 141  K 3.7 3.5 3.2* 3.3* 2.9*  CL 111 110 114* 110 107  CO2 24 23 22 22 22   GLUCOSE 91 97  93 80 108*  BUN 22* 22* 23* 20 22*  CREATININE 1.99* 2.08* 1.91* 1.96* 1.78*  CALCIUM 8.7* 8.3* 8.3* 7.9* 8.0*  MG 1.8 1.8 1.7 1.7 1.7  PHOS 3.4  --   --   --   --    Liver Function Tests:  Recent Labs Lab 07/16/15 0517 07/17/15 0345 07/18/15 0532 07/19/15 0624  AST 40 31 36 27  ALT 22 17 15* 12*  ALKPHOS 54 54 58 58  BILITOT 1.1 1.0 1.0 0.8  PROT 5.7* 5.6* 5.5* 5.5*  ALBUMIN 2.1* 1.9* 1.9* 1.8*   No results for input(s): LIPASE, AMYLASE in the last 168 hours. No results for input(s): AMMONIA in the last 168 hours. CBC:  Recent Labs Lab 07/15/15 0410 07/16/15 0517 07/17/15 0345 07/18/15 0532 07/19/15 0624  WBC 6.9 9.6 10.9* 12.4* 14.2*  NEUTROABS  --  7.6 9.0* 10.0* 11.5*  HGB 8.1* 8.0* 7.6* 8.3* 8.0*  HCT 24.0* 23.4* 22.6* 25.0* 22.6*  MCV 91.3 91.8 91.9 89.6 87.6  PLT 121* 144*  188 189 212   Cardiac Enzymes: No results for input(s): CKTOTAL, CKMB, CKMBINDEX, TROPONINI in the last 168 hours. BNP: BNP (last 3 results) No results for input(s): BNP in the last 8760 hours.  ProBNP (last 3 results) No results for input(s): PROBNP in the last 8760 hours.  CBG:  Recent Labs Lab 07/18/15 0716 07/18/15 1122 07/18/15 1619 07/18/15 2046 07/19/15 0729  GLUCAP 77 75 101* 108* 109*       Signed:  Dia Crawford, MD [facility] [phone] pager

## 2015-07-19 NOTE — Progress Notes (Signed)
Attempted to call report twice to [place].  Receiving RN did not answer.

## 2015-07-19 NOTE — ED Notes (Addendum)
Pt was just d/c from this Hospital this morning and sent to nursing home [facility]. Pt started having shortness of breath with a cough and noted to have a fever. Pt sent back here for further evaluation to r/o Pneumonia.  only been tolerating solid foods for 2 days.  Pt denies any pain.

## 2015-07-19 NOTE — Clinical Social Work Placement (Signed)
   CLINICAL SOCIAL WORK PLACEMENT  NOTE  Date:  07/19/2015  Patient Details  Name: Mitchell [name] MRN: [id number] Date of Birth: 06-May-1951  Clinical Social Work is seeking post-discharge placement for this patient at the   level of care (*CSW will initial, date and re-position this form in  chart as items are completed):      Patient/family provided with [facility] Work Department's list of facilities offering this level of care within the geographic area requested by the patient (or if unable, by the patient's family).      Patient/family informed of their freedom to choose among providers that offer the needed level of care, that participate in Medicare, Medicaid or managed care program needed by the patient, have an available bed and are willing to accept the patient.      Patient/family informed of [facility]'s ownership interest in [facility] and [facility], as well as of the fact that they are under no obligation to receive care at these facilities.  PASRR submitted to EDS on       PASRR number received on       Existing PASRR number confirmed on       FL2 transmitted to all facilities in geographic area requested by pt/family on       FL2 transmitted to all facilities within larger geographic area on       Patient informed that his/her managed care company has contracts with or will negotiate with certain facilities, including the following:            Patient/family informed of bed offers received.  Patient chooses bed at     [facility]  Physician recommends and patient chooses bed at      Patient to be transferred to   on  . [facility] 10/22  Patient to be transferred to facility by     [facility]  Patient family notified on   of transfer.VM left for wife on 10/22  Name of family member notified:      Wife Mrs. [name]  PHYSICIAN       Additional Comment:    _______________________________________________ Roanna Raider, LCSW 07/19/2015, 12:58  PM

## 2015-07-19 NOTE — ED Notes (Signed)
Admitting at bedside 

## 2015-07-19 NOTE — ED Notes (Signed)
MD at bedside.Tomi Bamberger

## 2015-07-19 NOTE — H&P (Signed)
[facility] History and Physical  Mitchell [name] [id number] DOB: 06/13/1951 DOA: 07/19/2015  Referring physician: Dorie Rank, MD. PCP: No primary care provider on file.   Chief Complaint: Fever and shortness of breath.  HPI: Mitchell [name] is a 64 y.o. male with a past medical history of hypertension, alcohol abuse, gout, hyperlipidemia, status post cardiac arrest with ventricular fibrillation (down for about 10 minutes on the field), status post CVA, status post community-acquired pneumonia who was discharged from the hospital earlier to [facility] rehabilitation facility, but returned to the hospital via the emergency department due to a 38C fever when he arrived to the skilled nursing facility. Per patient's wife, today early morning, he has some trouble swallowing applesauce and some pills, he coughed after this, but was able to eat more through the day without any major events. He is currently in no acute distress and denies any complaints.   Review of Systems:  Unable to obtain due to patient's confusion secondary to recent CVA and anoxic event.  Past Medical History  Diagnosis Date  . Hypertension   . Alcohol abuse   . Gout   . Hyperlipemia    Past Surgical History  Procedure Laterality Date  . Radiology with anesthesia N/A 07/17/2015    Procedure: RADIOLOGY WITH ANESTHESIA;  Surgeon: Medication Radiologist, MD;  Location: [facility];  Service: Radiology;  Laterality: N/A;   Social History:  reports that he has been smoking Cigars.  He does not have any smokeless tobacco history on file. His alcohol and drug histories are not on file.  Allergies  Allergen Reactions  . Hydrochlorothiazide     REACTION: precipitates gout  . Pseudoephedrine     REACTION: rash    No family history on file.  Prior to Admission medications   Medication Sig Start Date End Date Taking? Authorizing Provider  Amino Acids-Protein Hydrolys (FEEDING SUPPLEMENT, PRO-STAT SUGAR FREE 64,) LIQD Place 30  mLs into feeding tube 2 (two) times daily. 07/19/15   Allie Bossier, MD  aspirin EC 81 MG EC tablet Take 1 tablet (81 mg total) by mouth daily. 07/19/15   Allie Bossier, MD  folic acid (FOLVITE) 1 MG tablet Take 1 tablet (1 mg total) by mouth daily. 07/19/15   Allie Bossier, MD  metoprolol tartrate (LOPRESSOR) 25 MG tablet Take 1 tablet (25 mg total) by mouth 2 (two) times daily. 07/19/15   Allie Bossier, MD  multivitamin (PROSIGHT) TABS tablet Take 1 tablet by mouth daily. 07/19/15   Allie Bossier, MD  Nutritional Supplements (FEEDING SUPPLEMENT, JEVITY 1.2 CAL,) LIQD Place 1,000 mLs into feeding tube continuous. Increase by 10 ml every 4 hours to goal rate of 60 ml/hr.; NOTE will hold at 52ml/hr for next 48 hours and then attempt to increase to previously mentioned goal secondary to patient not tolerating feeds. Check Residuals q 2hr 07/19/15   Allie Bossier, MD  pantoprazole (PROTONIX) 40 MG tablet Take 1 tablet (40 mg total) by mouth daily. 07/19/15   Allie Bossier, MD  predniSONE (DELTASONE) 20 MG tablet Place 2 tablets (40 mg total) into feeding tube daily with breakfast. 07/19/15   Allie Bossier, MD  thiamine 100 MG tablet Take 1 tablet (100 mg total) by mouth daily. 07/19/15   Allie Bossier, MD   Physical Exam: Filed Vitals:   07/19/15 1841 07/19/15 1845 07/19/15 1916 07/19/15 2104  BP: 119/77 130/73  131/75  Pulse: 102 102  102  Temp:   100.1 F (  37.8 C) 100.4 F (38 C)  TempSrc:   Oral Oral  Resp: 31 26  18   Height:      Weight:      SpO2: 98% 96%  93%    Wt Readings from Last 3 Encounters:  07/19/15 77.565 kg (171 lb)  07/19/15 77.792 kg (171 lb 8 oz)  01/04/07 88.451 kg (195 lb)    General:  Appears calm and comfortable Eyes: PERRL, normal lids, irises & conjunctiva ENT: grossly normal hearing, lips & tongue Neck: no LAD, masses or thyromegaly Cardiovascular: RRR, no m/r/g. No LE edema. Telemetry: SR, no arrhythmias  Respiratory: Tachypnea at 28 respirations  per minute, Bilateral wheezing and rhonchi. Abdomen: soft, ntnd Skin: no rash or induration seen on limited exam Musculoskeletal: grossly normal tone BUE/BLE Psychiatric: grossly normal mood and affect, speech is mildly slurred, but fluent and appropriate Neurologic: Awake alert oriented 2, partially oriented to time, mild left hemiparesis.          Labs on Admission:  Basic Metabolic Panel:  Recent Labs Lab 07/15/15 0410 07/16/15 0517 07/17/15 0345 07/18/15 0532 07/19/15 0624 07/19/15 1718  NA 146* 144 146* 142 141 143  K 3.7 3.5 3.2* 3.3* 2.9* 4.8  CL 111 110 114* 110 107 112*  CO2 24 23 22 22 22  21*  GLUCOSE 91 97 93 80 108* 138*  BUN 22* 22* 23* 20 22* 22*  CREATININE 1.99* 2.08* 1.91* 1.96* 1.78* 1.68*  CALCIUM 8.7* 8.3* 8.3* 7.9* 8.0* 8.5*  MG 1.8 1.8 1.7 1.7 1.7  --   PHOS 3.4  --   --   --   --   --    Liver Function Tests:  Recent Labs Lab 07/16/15 0517 07/17/15 0345 07/18/15 0532 07/19/15 0624 07/19/15 1718  AST 40 31 36 27 37  ALT 22 17 15* 12* 12*  ALKPHOS 54 54 58 58 72  BILITOT 1.1 1.0 1.0 0.8 1.7*  PROT 5.7* 5.6* 5.5* 5.5* 6.8  ALBUMIN 2.1* 1.9* 1.9* 1.8* 2.1*   CBC:  Recent Labs Lab 07/16/15 0517 07/17/15 0345 07/18/15 0532 07/19/15 0624 07/19/15 1718  WBC 9.6 10.9* 12.4* 14.2* 12.8*  NEUTROABS 7.6 9.0* 10.0* 11.5* 11.5*  HGB 8.0* 7.6* 8.3* 8.0* 9.6*  HCT 23.4* 22.6* 25.0* 22.6* 27.8*  MCV 91.8 91.9 89.6 87.6 87.4  PLT 144* 188 189 212 216    BNP (last 3 results)  Recent Labs  07/19/15 1718  BNP 3064.0*    CBG:  Recent Labs Lab 07/18/15 1122 07/18/15 1619 07/18/15 2046 07/19/15 0729 07/19/15 1105  GLUCAP 75 101* 108* 109* 104*    Radiological Exams on Admission: Dg Chest Port 1 View  07/19/2015  CLINICAL DATA:  Respiratory distress, chest pain EXAM: PORTABLE CHEST 1 VIEW COMPARISON:  06/28/2015 FINDINGS: Cardiomegaly with perihilar edema. No definite pleural effusions. No pneumothorax. Interval removal of left IJ  venous catheter. IMPRESSION: Cardiomegaly with perihilar edema. Electronically Signed   By: Julian Hy M.D.   On: 07/19/2015 17:38   Dg Chest Port 1 View  07/18/2015  CLINICAL DATA:  Hypoxia. EXAM: PORTABLE CHEST 1 VIEW COMPARISON:  07/13/2015 FINDINGS: Left internal jugular central venous catheter tip in the proximal SVC. Cardiomediastinal contours are unchanged. Persistent bilateral perihilar opacities suspicious for pulmonary edema, with mild improvement from the left and unchanged appearance in the right allowing for differences in technique. Question layering pleural effusions, with retrocardiac opacity. No pneumothorax. Overlying artifacts project over the upper hemithorax. IMPRESSION: Minimal improved aeration of the left  perihilar region, with persistent perihilar opacities suspicious for pulmonary edema. Probable pleural effusions, retrocardiac opacity likely effusion and atelectasis. Electronically Signed   By: Jeb Levering M.D.   On: 07/18/2015 02:29   Dg Swallowing Func-speech Pathology  07/18/2015  Objective Swallowing Evaluation:   Patient Details Name: Mitchell [name] MRN: [id number] Date of Birth: 1950/12/26 Today's Date: 07/18/2015 Time: SLP Start Time (ACUTE ONLY): 1145-SLP Stop Time (ACUTE ONLY): 1210 SLP Time Calculation (min) (ACUTE ONLY): 25 min Past Medical History: Past Medical History Diagnosis Date . Hypertension  . Alcohol abuse  . Gout  . Hyperlipemia  Past Surgical History: No past surgical history on file. HPI: Other Pertinent Information: Pt is a 64 yo AAM with incomplete pmh but known HTN, Gout, Hyperlipidemia, ETOH abuse who was noted to have respiratory distress and odd resp sounds per wife 1 min after laying back down early am 10/11 and EMS was activated. Wife attempted cpr at home. He was down for approximately 10 minutes AND when EMS he was in V fib. Shocked x 1 and epi x1 with return of circulation. Intubated and transported to [facility] ED. He was taken for CT of chest  and head to rule out PE and or Neurological injury. Pt with possible tracheal/laryngeal/esophageal/pharyngeal injury from emergent intubation. Intubated 10/11-10/14. ENT perfomed laryngoscopy however unable to visualize larynx due to copious secretions.  No Data Recorded Assessment / Plan / Recommendation CHL IP CLINICAL IMPRESSIONS 07/18/2015 Therapy Diagnosis Mild oral phase dysphagia;Mild pharyngeal phase dysphagia Clinical Impression Pt presents with a mild oral dysphagia with anterior mastication and slow bolus formation and transit with solids. Oropharyngeal phase also mildy impaired with a mild delay in swallow iniatition with resuting trace penetration before/during the swallow with large consecutive sips. Pt also with mild weakness of the hyolaryngeal mechanism with mild residuals post swallow. Chin tuck does not aid in transit, though cued effortful swallow shows potential for improvement. Recommend pt initiate a dys 3 (mechanical soft) diet with thin liquids, being careful to take one single sip at a time, avoid straws and take pills whole in puree. Pt was able to repeat these precautions and return demonstrate independently with water after session complete. SLP will f/u for tolerance.    CHL IP TREATMENT RECOMMENDATION 07/18/2015 Treatment Recommendations Therapy as outlined in treatment plan below   CHL IP DIET RECOMMENDATION 07/18/2015 SLP Diet Recommendations Dysphagia 3 (Mech soft);Thin Liquid Administration via (None) Medication Administration Whole meds with puree Compensations Slow rate;Small sips/bites Postural Changes and/or Swallow Maneuvers (None)   CHL IP OTHER RECOMMENDATIONS 07/18/2015 Recommended Consults (None) Oral Care Recommendations Oral care BID Other Recommendations (None)   No flowsheet data found.  CHL IP FREQUENCY AND DURATION 07/18/2015 Speech Therapy Frequency (ACUTE ONLY) min 2x/week Treatment Duration 2 weeks   Pertinent Vitals/Pain NA  SLP Swallow Goals No flowsheet data  found. No flowsheet data found.   CHL IP REASON FOR REFERRAL 07/18/2015 Reason for Referral Objectively evaluate swallowing function         Herbie Baltimore, MA CCC-SLP [id number] [place], Katherene Ponto 07/18/2015, 1:56 PM    Echocardiogram: 07/11/2015. ------------------------------------------------------------------- LV EF: 20% -  25%  ------------------------------------------------------------------- Indications:   Cardiac arrest 427.5.  ------------------------------------------------------------------- History:  Risk factors: Alcohol Abuse. Current tobacco use. Hypertension.  ------------------------------------------------------------------- Study Conclusions  - Left ventricle: Poor echo window. The cavity size was normal. Systolic function was severely reduced. The estimated ejection fraction was in the range of 20% to 25%. Diffuse hypokinesis. Although no diagnostic regional wall motion abnormality was  identified, this possibility cannot be completely excluded on the basis of this study. The study is not technically sufficient to allow evaluation of LV diastolic function. - Aortic valve: There was trivial regurgitation.    Assessment/Plan Principal Problem:   HCAP (healthcare-associated pneumonia)/Fever Not sure at this point if the patient has a recurrence of pneumonia or if symptoms are from aspiration, since the patient wife states that he occasionally coughs when eating. I explained to his wife, that after this CVA, that is going to be a persistent risk of aspiration. The only way to avoid this risk is too permanently use the feeding tube, which is something that they are not amenable to do long-term. I will continue nosocomial coverage antibiotic therapy started in the ED since the patient was febrile, however there is no increase in WBC. They have actually decreased.  Follow-up blood cultures and sensitivity. Continue supplemental oxygen. Continue  bronchodilators.  Active Problems:   Cerebrovascular accident (CVA) ([place]) Continue supportive care. Patient to be discharged back to rehabilitation facility once treatment is completed.     Cardiac arrest with ventricular fibrillation (HCC) Continue cardiac monitoring.    Hyperlipidemia Continue diet measures.    HYPERTENSION, BENIGN SYSTEMIC Continue metoprolol. Monitor blood pressure.      Chronic systolic CHF (congestive heart failure) (HCC) Continue metoprolol and monitor input/output. See recent echocardiogram results above.        Code Status: Full code. DVT Prophylaxis: Lovenox SQ. Family Communication: Mrs. Jeraldo Vivier (the patient's wife) was present in the room during the time of examination. Disposition Plan: Admit for treatment of possible aspiration/HCAP.   Time spent: Over 70 minutes were spent in the process of this admission.   Reubin Milan [facility] Pager [phone]-

## 2015-07-19 NOTE — ED Provider Notes (Signed)
CSN: [id number]     Arrival date & time 07/19/15  1634 History   First MD Initiated Contact with Patient 07/19/15 1637     Chief Complaint  Patient presents with  . Respiratory Distress   HPI Patient presents to the emergency room for evaluation of fever and coughing. Patient has had a complex recent medical history. Approximately 10 days ago he was admitted to the hospital after cardiac arrest.   during his hospitalization he was treated for a community-acquired pneumonia, a tracheal tear presumably due to his traumatic intubation during resuscitation, and had an anoxic brain injury versus an ischemic stroke. The patient was discharged from this hospital today and was transferred to [facility] facility. Upon arrival, the patient was noted to have a fever and coughing. He was immediately sent back to the emergency room.  Patient's wife states he has been having some difficulty with coughing and choking especially after he tries to drink any liquids. Patient has a feeding tube that he has been having small sips of fluid and taking his pills orally. Patient has been coughing and gagging when he does that.   Currently the patient is denying any complaints of shortness of breath or chest pain. He is not feel nauseated. He is not having any abdominal pain.   Past Medical History  Diagnosis Date  . Hypertension   . Alcohol abuse   . Gout   . Hyperlipemia    Past Surgical History  Procedure Laterality Date  . Radiology with anesthesia N/A 07/17/2015    Procedure: RADIOLOGY WITH ANESTHESIA;  Surgeon: Medication Radiologist, MD;  Location: [facility];  Service: Radiology;  Laterality: N/A;   No family history on file. Social History  Substance Use Topics  . Smoking status: Current Some Day Smoker    Types: Cigars  . Smokeless tobacco: None  . Alcohol Use: None    Review of Systems  All other systems reviewed and are negative.     Allergies  Hydrochlorothiazide and  Pseudoephedrine  Home Medications   Prior to Admission medications   Medication Sig Start Date End Date Taking? Authorizing Provider  Amino Acids-Protein Hydrolys (FEEDING SUPPLEMENT, PRO-STAT SUGAR FREE 64,) LIQD Place 30 mLs into feeding tube 2 (two) times daily. 07/19/15   Allie Bossier, MD  aspirin EC 81 MG EC tablet Take 1 tablet (81 mg total) by mouth daily. 07/19/15   Allie Bossier, MD  folic acid (FOLVITE) 1 MG tablet Take 1 tablet (1 mg total) by mouth daily. 07/19/15   Allie Bossier, MD  metoprolol tartrate (LOPRESSOR) 25 MG tablet Take 1 tablet (25 mg total) by mouth 2 (two) times daily. 07/19/15   Allie Bossier, MD  multivitamin (PROSIGHT) TABS tablet Take 1 tablet by mouth daily. 07/19/15   Allie Bossier, MD  Nutritional Supplements (FEEDING SUPPLEMENT, JEVITY 1.2 CAL,) LIQD Place 1,000 mLs into feeding tube continuous. Increase by 10 ml every 4 hours to goal rate of 60 ml/hr.; NOTE will hold at 2ml/hr for next 48 hours and then attempt to increase to previously mentioned goal secondary to patient not tolerating feeds. Check Residuals q 2hr 07/19/15   Allie Bossier, MD  pantoprazole (PROTONIX) 40 MG tablet Take 1 tablet (40 mg total) by mouth daily. 07/19/15   Allie Bossier, MD  predniSONE (DELTASONE) 20 MG tablet Place 2 tablets (40 mg total) into feeding tube daily with breakfast. 07/19/15   Allie Bossier, MD  thiamine 100 MG tablet Take  1 tablet (100 mg total) by mouth daily. 07/19/15   Allie Bossier, MD   BP 119/77 mmHg  Pulse 102  Temp(Src) 100.5 F (38.1 C) (Oral)  Resp 31  Ht 5\' 8"  (1.727 m)  Wt 171 lb (77.565 kg)  BMI 26.01 kg/m2  SpO2 98% Physical Exam  Constitutional: He has a sickly appearance. No distress.  HENT:  Head: Normocephalic and atraumatic.  Right Ear: External ear normal.  Left Ear: External ear normal.  Eyes: Conjunctivae are normal. Right eye exhibits no discharge. Left eye exhibits no discharge. No scleral icterus.  Neck: Neck supple.  No tracheal deviation present.  Cardiovascular: Normal rate, regular rhythm and intact distal pulses.   Pulmonary/Chest: Effort normal. No stridor. No respiratory distress. He has no wheezes. He has no rales.  Distant breath sounds bilaterally occasional faint wheeze on expiration  Abdominal: Soft. Bowel sounds are normal. He exhibits no distension. There is no tenderness. There is no rebound and no guarding.  Feeding tube in the left upper quadrant of the abdomen without any drainage  Musculoskeletal: He exhibits no edema or tenderness.  Neurological: He is alert. No cranial nerve deficit (no facial droop, extraocular movements intact, no slurred speech) or sensory deficit. He exhibits normal muscle tone. He displays no seizure activity. Coordination normal.  Generalized weakness but the patient is able to sit up in bed with some assistance. He has equal grip strength bilaterally, normal plantar flexion strength bilaterally, normal sensation  Skin: Skin is warm and dry. No rash noted.  Psychiatric: He has a normal mood and affect.  Nursing note and vitals reviewed.   ED Course  Procedures (including critical care time) Labs Review Labs Reviewed  COMPREHENSIVE METABOLIC PANEL - Abnormal; Notable for the following:    Chloride 112 (*)    CO2 21 (*)    Glucose, Bld 138 (*)    BUN 22 (*)    Creatinine, Ser 1.68 (*)    Calcium 8.5 (*)    Albumin 2.1 (*)    ALT 12 (*)    Total Bilirubin 1.7 (*)    GFR calc non Af Amer 41 (*)    GFR calc Af Amer 48 (*)    All other components within normal limits  CBC WITH DIFFERENTIAL/PLATELET - Abnormal; Notable for the following:    WBC 12.8 (*)    RBC 3.18 (*)    Hemoglobin 9.6 (*)    HCT 27.8 (*)    Neutro Abs 11.5 (*)    All other components within normal limits  URINALYSIS, ROUTINE W REFLEX MICROSCOPIC (NOT AT [facility]) - Abnormal; Notable for the following:    Color, Urine AMBER (*)    APPearance CLOUDY (*)    Bilirubin Urine SMALL (*)     Ketones, ur 15 (*)    Protein, ur 30 (*)    All other components within normal limits  URINE MICROSCOPIC-ADD ON - Abnormal; Notable for the following:    Bacteria, UA FEW (*)    All other components within normal limits  CULTURE, BLOOD (ROUTINE X 2)  CULTURE, BLOOD (ROUTINE X 2)  URINE CULTURE  BRAIN NATRIURETIC PEPTIDE  I-STAT CG4 LACTIC ACID, ED  Randolm Idol, ED    Imaging Review Dg Chest Port 1 View  07/19/2015  CLINICAL DATA:  Respiratory distress, chest pain EXAM: PORTABLE CHEST 1 VIEW COMPARISON:  06/28/2015 FINDINGS: Cardiomegaly with perihilar edema. No definite pleural effusions. No pneumothorax. Interval removal of left IJ venous catheter. IMPRESSION: Cardiomegaly with  perihilar edema. Electronically Signed   By: Julian Hy M.D.   On: 07/19/2015 17:38   Dg Chest Port 1 View  07/18/2015  CLINICAL DATA:  Hypoxia. EXAM: PORTABLE CHEST 1 VIEW COMPARISON:  07/13/2015 FINDINGS: Left internal jugular central venous catheter tip in the proximal SVC. Cardiomediastinal contours are unchanged. Persistent bilateral perihilar opacities suspicious for pulmonary edema, with mild improvement from the left and unchanged appearance in the right allowing for differences in technique. Question layering pleural effusions, with retrocardiac opacity. No pneumothorax. Overlying artifacts project over the upper hemithorax. IMPRESSION: Minimal improved aeration of the left perihilar region, with persistent perihilar opacities suspicious for pulmonary edema. Probable pleural effusions, retrocardiac opacity likely effusion and atelectasis. Electronically Signed   By: Jeb Levering M.D.   On: 07/18/2015 02:29    I have personally reviewed and evaluated these images and lab results as part of my medical decision-making.   MDM   Final diagnoses:  Fever, unspecified fever cause    Pt is having issues with  Fever and cough.  He was just released from the hospital and was immediately sent  back from the nursing facility.  Pt has low grade temp of 100.5  No PNA is noted on the xray.  No uti.  No other source of infection noted.  Pt has been coughing.  Possible viral illness vs pna not visible on xray at this point.  Pt appears stable to return to nursing facility but will consult with the medical team that recently discharged patient for their assessment.    Dorie Rank, MD 07/19/15 Lurline Hare

## 2015-07-19 NOTE — Progress Notes (Signed)
ANTIBIOTIC CONSULT NOTE - INITIAL  Pharmacy Consult for vancomycin  Indication: pneumonia  Allergies  Allergen Reactions  . Hydrochlorothiazide     REACTION: precipitates gout  . Pseudoephedrine     REACTION: rash    Patient Measurements: Height: 5\' 8"  (172.7 cm) Weight: 171 lb (77.565 kg) IBW/kg (Calculated) : 68.4 Adjusted Body Weight:   Vital Signs: Temp: 100.5 F (38.1 C) (10/22 1648) Temp Source: Oral (10/22 1648) BP: 130/73 mmHg (10/22 1845) Pulse Rate: 102 (10/22 1845) Intake/Output from previous day:   Intake/Output from this shift:    Labs:  Recent Labs  07/18/15 0532 07/19/15 0624 07/19/15 1718  WBC 12.4* 14.2* 12.8*  HGB 8.3* 8.0* 9.6*  PLT 189 212 216  CREATININE 1.96* 1.78* 1.68*   Estimated Creatinine Clearance: 42.4 mL/min (by C-G formula based on Cr of 1.68). No results for input(s): VANCOTROUGH, VANCOPEAK, VANCORANDOM, GENTTROUGH, GENTPEAK, GENTRANDOM, TOBRATROUGH, TOBRAPEAK, TOBRARND, AMIKACINPEAK, AMIKACINTROU, AMIKACIN in the last 72 hours.   Microbiology: Recent Results (from the past 720 hour(s))  MRSA PCR Screening     Status: None   Collection Time: 07/08/15  8:46 AM  Result Value Ref Range Status   MRSA by PCR NEGATIVE NEGATIVE Final    Comment:        The GeneXpert MRSA Assay (FDA approved for NASAL specimens only), is one component of a comprehensive MRSA colonization surveillance program. It is not intended to diagnose MRSA infection nor to guide or monitor treatment for MRSA infections.   Culture, blood (routine x 2)     Status: None (Preliminary result)   Collection Time: 07/15/15  8:24 PM  Result Value Ref Range Status   Specimen Description BLOOD RIGHT ARM  Final   Special Requests IN PEDIATRIC BOTTLE 1.5CC  Final   Culture NO GROWTH 4 DAYS  Final   Report Status PENDING  Incomplete  Culture, blood (routine x 2)     Status: None (Preliminary result)   Collection Time: 07/15/15  8:41 PM  Result Value Ref Range  Status   Specimen Description BLOOD LEFT HAND  Final   Special Requests IN PEDIATRIC BOTTLE 2CC  Final   Culture NO GROWTH 4 DAYS  Final   Report Status PENDING  Incomplete    Medical History: Past Medical History  Diagnosis Date  . Hypertension   . Alcohol abuse   . Gout   . Hyperlipemia     Medications:  Scheduled:   Infusions:  . sodium chloride 1,000 mL (07/19/15 1804)  . cefTAZidime (FORTAZ)  IV     Assessment: 64 yo male with pneumonia will be started on vancomycin in addition to ceftazidime.  CrCl ~42.  Goal of Therapy:  Vancomycin trough level 15-20 mcg/ml  Plan:  - vancomycin 750 mg iv q12h - change ceftazidime to 2g iv q12h - check vancomycin trough when it's appropriate - monitor renal function  Mitchell [name], Mitchell [name] 07/19/2015,7:12 PM

## 2015-07-20 ENCOUNTER — Inpatient Hospital Stay (HOSPITAL_COMMUNITY): Payer: Medicare Other

## 2015-07-20 DIAGNOSIS — J810 Acute pulmonary edema: Secondary | ICD-10-CM

## 2015-07-20 DIAGNOSIS — I639 Cerebral infarction, unspecified: Secondary | ICD-10-CM

## 2015-07-20 DIAGNOSIS — I469 Cardiac arrest, cause unspecified: Secondary | ICD-10-CM

## 2015-07-20 DIAGNOSIS — E876 Hypokalemia: Secondary | ICD-10-CM | POA: Diagnosis present

## 2015-07-20 DIAGNOSIS — R509 Fever, unspecified: Secondary | ICD-10-CM

## 2015-07-20 DIAGNOSIS — M1000 Idiopathic gout, unspecified site: Secondary | ICD-10-CM

## 2015-07-20 DIAGNOSIS — E785 Hyperlipidemia, unspecified: Secondary | ICD-10-CM

## 2015-07-20 DIAGNOSIS — D696 Thrombocytopenia, unspecified: Secondary | ICD-10-CM

## 2015-07-20 DIAGNOSIS — I10 Essential (primary) hypertension: Secondary | ICD-10-CM

## 2015-07-20 DIAGNOSIS — I5022 Chronic systolic (congestive) heart failure: Secondary | ICD-10-CM

## 2015-07-20 DIAGNOSIS — I4901 Ventricular fibrillation: Secondary | ICD-10-CM

## 2015-07-20 DIAGNOSIS — J398 Other specified diseases of upper respiratory tract: Secondary | ICD-10-CM

## 2015-07-20 LAB — CBC WITH DIFFERENTIAL/PLATELET
BASOS PCT: 0 %
BASOS PCT: 0 %
Basophils Absolute: 0 10*3/uL (ref 0.0–0.1)
Basophils Absolute: 0 10*3/uL (ref 0.0–0.1)
EOS ABS: 0 10*3/uL (ref 0.0–0.7)
EOS PCT: 0 %
EOS PCT: 0 %
Eosinophils Absolute: 0 10*3/uL (ref 0.0–0.7)
HEMATOCRIT: 22.8 % — AB (ref 39.0–52.0)
HEMATOCRIT: 23.5 % — AB (ref 39.0–52.0)
HEMOGLOBIN: 7.7 g/dL — AB (ref 13.0–17.0)
HEMOGLOBIN: 8.3 g/dL — AB (ref 13.0–17.0)
LYMPHS PCT: 5 %
Lymphocytes Relative: 6 %
Lymphs Abs: 0.7 10*3/uL (ref 0.7–4.0)
Lymphs Abs: 0.5 10*3/uL — ABNORMAL LOW (ref 0.7–4.0)
MCH: 29.7 pg (ref 26.0–34.0)
MCH: 30.6 pg (ref 26.0–34.0)
MCHC: 33.8 g/dL (ref 30.0–36.0)
MCHC: 35.3 g/dL (ref 30.0–36.0)
MCV: 88 fL (ref 78.0–100.0)
MCV: 86.7 fL (ref 78.0–100.0)
MONO ABS: 0.9 10*3/uL (ref 0.1–1.0)
Monocytes Absolute: 0.4 10*3/uL (ref 0.1–1.0)
Monocytes Relative: 8 %
Monocytes Relative: 4 %
NEUTROS PCT: 91 %
Neutro Abs: 10.1 10*3/uL — ABNORMAL HIGH (ref 1.7–7.7)
Neutro Abs: 9.8 10*3/uL — ABNORMAL HIGH (ref 1.7–7.7)
Neutrophils Relative %: 86 %
Platelets: 229 10*3/uL (ref 150–400)
Platelets: 275 10*3/uL (ref 150–400)
RBC: 2.59 MIL/uL — ABNORMAL LOW (ref 4.22–5.81)
RBC: 2.71 MIL/uL — ABNORMAL LOW (ref 4.22–5.81)
RDW: 15.4 % (ref 11.5–15.5)
RDW: 15.4 % (ref 11.5–15.5)
WBC: 11.7 10*3/uL — ABNORMAL HIGH (ref 4.0–10.5)
WBC: 10.7 10*3/uL — ABNORMAL HIGH (ref 4.0–10.5)

## 2015-07-20 LAB — COMPREHENSIVE METABOLIC PANEL
ALBUMIN: 1.9 g/dL — AB (ref 3.5–5.0)
ALT: 11 U/L — AB (ref 17–63)
ALT: 14 U/L — ABNORMAL LOW (ref 17–63)
ANION GAP: 9 (ref 5–15)
AST: 23 U/L (ref 15–41)
AST: 32 U/L (ref 15–41)
Albumin: 2 g/dL — ABNORMAL LOW (ref 3.5–5.0)
Alkaline Phosphatase: 56 U/L (ref 38–126)
Alkaline Phosphatase: 58 U/L (ref 38–126)
Anion gap: 9 (ref 5–15)
BUN: 27 mg/dL — AB (ref 6–20)
BUN: 34 mg/dL — ABNORMAL HIGH (ref 6–20)
CALCIUM: 8.1 mg/dL — AB (ref 8.9–10.3)
CHLORIDE: 109 mmol/L (ref 101–111)
CHLORIDE: 110 mmol/L (ref 101–111)
CO2: 19 mmol/L — AB (ref 22–32)
CO2: 20 mmol/L — AB (ref 22–32)
CREATININE: 1.7 mg/dL — AB (ref 0.61–1.24)
Calcium: 7.8 mg/dL — ABNORMAL LOW (ref 8.9–10.3)
Creatinine, Ser: 1.74 mg/dL — ABNORMAL HIGH (ref 0.61–1.24)
GFR calc Af Amer: 47 mL/min — ABNORMAL LOW (ref >60)
GFR calc non Af Amer: 41 mL/min — ABNORMAL LOW (ref >60)
GFR calc non Af Amer: 39 mL/min — ABNORMAL LOW (ref >60)
GFR, EST AFRICAN AMERICAN: 46 mL/min — AB (ref >60)
Glucose, Bld: 130 mg/dL — ABNORMAL HIGH (ref 65–99)
Glucose, Bld: 134 mg/dL — ABNORMAL HIGH (ref 65–99)
Potassium: 4.3 mmol/L (ref 3.5–5.1)
Potassium: 4.4 mmol/L (ref 3.5–5.1)
SODIUM: 137 mmol/L (ref 135–145)
SODIUM: 139 mmol/L (ref 135–145)
Total Bilirubin: 0.9 mg/dL (ref 0.3–1.2)
Total Bilirubin: 0.8 mg/dL (ref 0.3–1.2)
Total Protein: 6.1 g/dL — ABNORMAL LOW (ref 6.5–8.1)
Total Protein: 6.9 g/dL (ref 6.5–8.1)

## 2015-07-20 LAB — CULTURE, BLOOD (ROUTINE X 2)
CULTURE: NO GROWTH
Culture: NO GROWTH

## 2015-07-20 LAB — BLOOD GAS, ARTERIAL
ACID-BASE DEFICIT: 4.9 mmol/L — AB (ref 0.0–2.0)
BICARBONATE: 18.4 meq/L — AB (ref 20.0–24.0)
DELIVERY SYSTEMS: POSITIVE
DRAWN BY: 33176
Expiratory PAP: 5
FIO2: 100
Inspiratory PAP: 10
O2 Saturation: 99.7 %
PATIENT TEMPERATURE: 98.6
PH ART: 7.449 (ref 7.350–7.450)
PO2 ART: 335 mmHg — AB (ref 80.0–100.0)
RATE: 10 resp/min
TCO2: 19.2 mmol/L (ref 0–100)
pCO2 arterial: 26.9 mmHg — ABNORMAL LOW (ref 35.0–45.0)

## 2015-07-20 LAB — TROPONIN I
TROPONIN I: 0.05 ng/mL — AB (ref <0.031)
Troponin I: 0.06 ng/mL — ABNORMAL HIGH (ref <0.031)

## 2015-07-20 LAB — STREP PNEUMONIAE URINARY ANTIGEN: STREP PNEUMO URINARY ANTIGEN: NEGATIVE

## 2015-07-20 LAB — LACTIC ACID, PLASMA: LACTIC ACID, VENOUS: 1.4 mmol/L (ref 0.5–2.0)

## 2015-07-20 LAB — MAGNESIUM: Magnesium: 1.9 mg/dL (ref 1.7–2.4)

## 2015-07-20 MED ORDER — ADULT MULTIVITAMIN W/MINERALS CH
1.0000 | ORAL_TABLET | Freq: Every day | ORAL | Status: DC
  Administered 2015-07-21 – 2015-07-23 (×3): 1 via ORAL
  Filled 2015-07-20 (×4): qty 1

## 2015-07-20 MED ORDER — LEVALBUTEROL HCL 0.63 MG/3ML IN NEBU
1.2500 mg | INHALATION_SOLUTION | Freq: Four times a day (QID) | RESPIRATORY_TRACT | Status: DC
  Administered 2015-07-20: 1.25 mg via RESPIRATORY_TRACT
  Filled 2015-07-20: qty 6

## 2015-07-20 MED ORDER — FUROSEMIDE 10 MG/ML IJ SOLN
60.0000 mg | Freq: Once | INTRAMUSCULAR | Status: AC
  Administered 2015-07-20: 60 mg via INTRAVENOUS
  Filled 2015-07-20: qty 6

## 2015-07-20 MED ORDER — FOLIC ACID 1 MG PO TABS
1.0000 mg | ORAL_TABLET | Freq: Every day | ORAL | Status: DC
  Administered 2015-07-21 – 2015-07-23 (×3): 1 mg via ORAL
  Filled 2015-07-20 (×4): qty 1

## 2015-07-20 MED ORDER — PANTOPRAZOLE SODIUM 40 MG IV SOLR
40.0000 mg | Freq: Two times a day (BID) | INTRAVENOUS | Status: DC
  Administered 2015-07-21: 40 mg via INTRAVENOUS
  Filled 2015-07-20 (×3): qty 40

## 2015-07-20 MED ORDER — IPRATROPIUM BROMIDE 0.02 % IN SOLN
0.5000 mg | Freq: Four times a day (QID) | RESPIRATORY_TRACT | Status: DC
  Administered 2015-07-20: 0.5 mg via RESPIRATORY_TRACT
  Filled 2015-07-20: qty 2.5

## 2015-07-20 MED ORDER — FUROSEMIDE 10 MG/ML IJ SOLN
60.0000 mg | Freq: Once | INTRAMUSCULAR | Status: AC
  Administered 2015-07-20: 60 mg via INTRAVENOUS

## 2015-07-20 MED ORDER — MORPHINE SULFATE (PF) 2 MG/ML IV SOLN
INTRAVENOUS | Status: AC
  Filled 2015-07-20: qty 1

## 2015-07-20 MED ORDER — LEVALBUTEROL HCL 0.63 MG/3ML IN NEBU
1.2500 mg | INHALATION_SOLUTION | Freq: Three times a day (TID) | RESPIRATORY_TRACT | Status: DC
  Administered 2015-07-20: 0.63 mg via RESPIRATORY_TRACT
  Filled 2015-07-20: qty 6

## 2015-07-20 MED ORDER — MORPHINE SULFATE (PF) 2 MG/ML IV SOLN
2.0000 mg | Freq: Once | INTRAVENOUS | Status: AC
  Administered 2015-07-20: 2 mg via INTRAVENOUS

## 2015-07-20 MED ORDER — IPRATROPIUM BROMIDE 0.02 % IN SOLN
0.5000 mg | Freq: Three times a day (TID) | RESPIRATORY_TRACT | Status: DC
  Filled 2015-07-20: qty 2.5

## 2015-07-20 MED ORDER — JEVITY 1.2 CAL PO LIQD: 1000.0000 mL | ORAL | Status: DC

## 2015-07-20 MED ORDER — LORAZEPAM 2 MG/ML IJ SOLN
2.0000 mg | INTRAMUSCULAR | Status: DC | PRN
  Administered 2015-07-20: 3 mg via INTRAVENOUS
  Administered 2015-07-21 – 2015-07-23 (×6): 2 mg via INTRAVENOUS
  Filled 2015-07-20 (×3): qty 1
  Filled 2015-07-20: qty 2
  Filled 2015-07-20 (×3): qty 1

## 2015-07-20 MED ORDER — JEVITY 1.2 CAL PO LIQD
1000.0000 mL | ORAL | Status: DC
  Administered 2015-07-20 (×2): 1000 mL
  Filled 2015-07-20 (×2): qty 1000

## 2015-07-20 NOTE — Significant Event (Signed)
Rapid Response Event Note  Overview: Time Called: 1408 Arrival Time: 1410 Event Type: Respiratory  Initial Focused Assessment:  Called by RN to evaluate patient with Respiratory distress.  Upon my arrival to patients room, RN and RT and wife at bedside.  Patient receiving xopenex and atrovent neb, sats 93%.  Patient with labored increased WOB, SOB, RR 27 and accessory muscle use noted.  Skin diaphoretic.  Patient alert and oriented.  As per RN patient progressively had worsening breathing through out day, received 60 mg IV lasix at 1315 with minimally output.   Interventions:  Breath sounds audible crackles, Rhonchi b/l.  Dr Sherral Hammers at bedside. BP 143/93, RR 28, sats 93%, HR 93,  BIPAP  Initiated, 60 mg IV Lasix and 2 mg IV morphine given as per DR. Woods.  EKG done.  PCXR done, ABG pending.  BP at 1500 134/92, HR 101, Sats 94% on BIPAP.  Patient breathing much improved on BPAP, patient states feels much better   Event Summary:  Patient transferred to 3 S 16 via bed with monitor and BIPAP.  RRT report given to bedside RN   at      at          [facility], Harlin Rain

## 2015-07-20 NOTE — Progress Notes (Signed)
RT note- patient seen for routine treatment HHN, very short of breath, RR30+, very anxious. Rapis respoonse called and patient placed on NIV, and then transported to 3S16.

## 2015-07-20 NOTE — Progress Notes (Signed)
Pt extremely agitated, pulling at lines and tubes, hallucinating and continues to call out and standup at bedside without assist. Medication given as ordered.

## 2015-07-20 NOTE — Anesthesia Postprocedure Evaluation (Signed)
  Anesthesia Post-op Note  Patient: Mitchell [name]  Procedure(s) Performed: Procedure(s): RADIOLOGY WITH ANESTHESIA (N/A)  Patient Location: PACU  Anesthesia Type:General  Level of Consciousness: awake and alert   Airway and Oxygen Therapy: Patient Spontanous Breathing  Post-op Pain: none  Post-op Assessment: Post-op Vital signs reviewed LLE Motor Response: Purposeful movement   RLE Motor Response: Purposeful movement        Post-op Vital Signs: Reviewed  Last Vitals:  Filed Vitals:   07/19/15 1134  BP: 136/70  Pulse: 110  Temp:   Resp:     Complications: No apparent anesthesia complications

## 2015-07-20 NOTE — Progress Notes (Addendum)
Labored breathing noted when arrived in the room to begin Jevity feeding. Pt was distressed with c/o being very hot.  Skin diaphoretic. Patient alert and oriented but very distressed trying to pull off gown. O2 sats had fallen to 83 on RA. Placed pt on 2L O2. Shortly after Respiratory arrived and began breathing treatment. O2 sats up to 93 while receiving treatment. Pt still in distress, labored breathing with rhonchi heard. Called charge nurse to help with decision to call rapid response. Rapid response called and Dr. Sherral Hammers paged. Per order from Dr. Sherral Hammers STAT Chest X-ray ordered BPAP started by Respiratory and pt transferred to 3S. Called report to nurse.

## 2015-07-20 NOTE — Progress Notes (Signed)
[facility] TEAM 1 - Stepdown/ICU TEAM Progress Note  Mitchell [name] [id number] DOB: 20-Jun-1951 DOA: 07/19/2015 PCP: No primary care provider on file.  Admit HPI / Brief Narrative: 64 yo BM  PMHx HTN, HLD, Gout,  ETOH Abuse  Who was noted to have respiratory distress and odd resp sounds per wife 1 min after laying back down early am 10/11 and EMS was activated. Wife attempted cpr at home. He was down for approximately 10 minutes AND when EMS he was in V fib. Shocked x 1 and epi x1 with return of circulation. Intubated and transported to [facility] ED.    HPI/Subjective: 10/23 A/O  4, patient states became acutely short of breath, positive diaphoresis, negative N/V, negative CP. Wife states patient's diaphoresis was so significant was as if a bucket of water have been thrown on patient.    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 - EEG w/o seizure activity -Brain MRI; shows acute/subacute stroke see results below  Acute Respiratory Failure Post secondary to flash pulmonary edema/aspiration? -New MI? Decrease in cardiac status? -Trend troponins -EKG appeared to EKG from 10/12 shows continue prolonged QT interval, ST-T wave abnormalities consistent with inferior lateral ischemia. Secondary to demand ischemia? -Obtain echocardiogram -Nothing by mouth/hold tube feeds  Tracheal perforation (ill assume traumatic intubation) - S/P IR PEG placement -Hold all anticoagulants  CAP/aspiration pneumonitis? -Completed 10 days of tx prior to discharge on 10/22 -Obtain PCXR in a.m. -Will DC antibiotics and only restart if WBCs increased, bands, left shift, fever since patient has just completed 10 day course antibiotics  Post V fib arrest  -S/p Hypothermia protocol are to discharge on 10/22  H/o DVT -cannot be anticoagulated secondary to tracheal tear  Chronic Systolic CHF -[id number] CVP= 8 mmHg -Transfuse for hemoglobin<8 -10/20 transfuse 1 unit PRBC -Start metoprolol 25 mg  BID per  tube -Strict in and out since admission -439ml -Daily a.m. Weight; 10/23 bed weight= 76 kg  Pulmonary edema -Lasix IV 60 mg  HTN -BP check q 4hr -See systolic CHF  Acute Renal Failure/ ATN post arrest -Cr trending up with diuretic monitor closely. Use diuretics judiciously  Hypokalemia -Potassium goal> 4  Hypomagnesemia -Magnesium oxide 400 mg 1  Nutrition -10/21 Passed MBS; NPO -10/21 S/P PEG.; Hold tube feeds (Jevity; goal rate 60 ml/h)  Anemia  -Acute blood loss from tracheal tear and placement of PEG tube -Monitor closely, trending down -See chronic CHF  Thrombocytopenia (ETOH, dilution) -Improving continue monitor  ETOH abuse  -Continue CIWA protocol use  Gout flare -Uric acid goal<6 -10/18 uric acid= 9.4 -Prednisone 40 mg daily for 7 days (will allow patients renal function to improve)     Code Status: DO NOT RESUSCITATE Family Communication: Wife present at time of exam Disposition Plan:  SNF    Consultants: Dr.Wesam Kathryne Sharper [facility] M)   Procedure/Significant Events: 10/11 ct head - atrophy mild, neg acute 10/11 ct angio chest - There is extensive air in the neck and upper mediastinal region. Air is noted just anterior to the trachea in the lower neck region 10/11 EEG - markedly abnormal due to severe attenuation of background rhythms. No seizure activity. 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 -Minimal T2 changes w/i brainstem suggesting microvascular disease. 10/20 S/P IR GASTROSTOMY TUBE  placed 10/20 transfuse 1 unit PRBC   Culture 10/11 MRSA by PCR negative 10/18 blood right arm/left hand NGTD   Antibiotics: Unasyn 10/11>>10/16 Rocephin 10/16>>>  stopped 10/19 Cefazolin 10/20 one dose Fortaz 10/22>> stopped 10/23 Vancomycin 10/22>> stopped 10/23   DVT prophylaxis: SCD   Devices   LINES / TUBES:  CVL ij 10/11>>>     Continuous Infusions: . feeding supplement (JEVITY 1.2 CAL) 1,000 mL (07/20/15 1522)    Objective: VITAL SIGNS: Temp: 99.2 F (37.3 C) (10/23 1922) Temp Source: Axillary (10/23 1922) BP: 136/86 mmHg (10/23 1525) Pulse Rate: 104 (10/23 1525) SPO2; FIO2:   Intake/Output Summary (Last 24 hours) at 07/20/15 2001 Last data filed at 07/20/15 1926  Gross per 24 hour  Intake     90 ml  Output   1725 ml  Net  -1635 ml     Exam: General: A/O  4, positive acute respiratory distress Eyes: ,negative scleral hemorrhage ENT: Negative Runny nose, negative gingival bleeding, Neck:  Negative scars, masses, torticollis, lymphadenopathy, JVD, positive emphysema Rt>>Lt (improving) Lungs: bilateral rhonchi Rt lung fields>>>>Lt left lung fields, positive wheezing left lung fields, negative crackles Cardiovascular: Tachycardic, Regular rhythm without murmur gallop or rub normal S1 and S2 Abdomen:negative abdominal pain, nondistended, positive soft, bowel sounds, no rebound, no ascites, no appreciable mass Extremities: bilateral wrists swollen warm to the touch, negative pain Psychiatric:  Negative depression, negative anxiety, negative fatigue, negative mania  Neurologic:  Cranial nerves II through XII intact, tongue/uvula midline, RUE/RLE strength 5/5 (fine motor function decrease), Left hemiparesis, sensation intact throughout, positive mild dysarthria, negative expressive aphasia, negative receptive aphasia.   Data Reviewed: Basic Metabolic Panel:  Recent Labs Lab 07/15/15 0410  07/17/15 0345 07/18/15 0532 07/19/15 [id number] 07/19/15 1718 07/19/15 2122 07/20/15 0359 07/20/15 1630  NA 146*  < > 146* 142 141 143  --  137 139  K 3.7  < > 3.2* 3.3* 2.9* 4.8  --  4.3 4.4  CL 111  < > 114* 110 107 112*  --   109 110  CO2 24  < > 22 22 22  21*  --  19* 20*  GLUCOSE 91  < > 93 80 108* 138*  --  130* 134*  BUN 22*  < > 23* 20 22* 22*  --  27* 34*  CREATININE 1.99*  < > 1.91* 1.96* 1.78* 1.68*  --  1.70* 1.74*  CALCIUM 8.7*  < > 8.3* 7.9* 8.0* 8.5*  --  7.8* 8.1*  MG 1.8  < > 1.7 1.7 1.7  --  1.8  --  1.9  PHOS 3.4  --   --   --   --   --  3.0  --   --   < > = values in this interval not displayed. Liver Function Tests:  Recent Labs Lab 07/18/15 0532 07/19/15 0624 07/19/15 1718 07/20/15 0359 07/20/15 1630  AST 36 27 37 23 32  ALT 15* 12* 12* 11* 14*  ALKPHOS 58 58 72 56 58  BILITOT 1.0 0.8 1.7* 0.9 0.8  PROT 5.5* 5.5* 6.8 6.1* 6.9  ALBUMIN 1.9* 1.8* 2.1* 1.9* 2.0*   No results for input(s): LIPASE, AMYLASE in the last 168 hours. No results for input(s): AMMONIA in the last 168 hours. CBC:  Recent Labs Lab 07/18/15 0532 07/19/15 0624 07/19/15 1718 07/20/15 0359 07/20/15 1630  WBC 12.4* 14.2* 12.8* 11.7* 10.7*  NEUTROABS 10.0* 11.5* 11.5* 10.1* 9.8*  HGB 8.3* 8.0* 9.6* 7.7* 8.3*  HCT 25.0* 22.6*  27.8* 22.8* 23.5*  MCV 89.6 87.6 87.4 88.0 86.7  PLT 189 212 216 229 275   Cardiac Enzymes:  Recent Labs Lab 07/20/15 1630  TROPONINI 0.05*   BNP (last 3 results)  Recent Labs  07/19/15 1718  BNP 3064.0*    ProBNP (last 3 results) No results for input(s): PROBNP in the last 8760 hours.  CBG:  Recent Labs Lab 07/18/15 1122 07/18/15 1619 07/18/15 2046 07/19/15 0729 07/19/15 1105  GLUCAP 75 101* 108* 109* 104*    Recent Results (from the past 240 hour(s))  Culture, blood (routine x 2)     Status: None   Collection Time: 07/15/15  8:24 PM  Result Value Ref Range Status   Specimen Description BLOOD RIGHT ARM  Final   Special Requests IN PEDIATRIC BOTTLE 1.5CC  Final   Culture NO GROWTH 5 DAYS  Final   Report Status 07/20/2015 FINAL  Final  Culture, blood (routine x 2)     Status: None   Collection Time: 07/15/15  8:41 PM  Result Value Ref Range Status    Specimen Description BLOOD LEFT HAND  Final   Special Requests IN PEDIATRIC BOTTLE 2CC  Final   Culture NO GROWTH 5 DAYS  Final   Report Status 07/20/2015 FINAL  Final  Blood Culture (routine x 2)     Status: None (Preliminary result)   Collection Time: 07/19/15  5:18 PM  Result Value Ref Range Status   Specimen Description BLOOD LEFT HAND  Final   Special Requests BOTTLES DRAWN AEROBIC AND ANAEROBIC 5CC  Final   Culture NO GROWTH < 24 HOURS  Final   Report Status PENDING  Incomplete  Blood Culture (routine x 2)     Status: None (Preliminary result)   Collection Time: 07/19/15  5:20 PM  Result Value Ref Range Status   Specimen Description BLOOD RIGHT ANTECUBITAL  Final   Special Requests BOTTLES DRAWN AEROBIC ONLY 5CC  Final   Culture NO GROWTH < 24 HOURS  Final   Report Status PENDING  Incomplete  Urine culture     Status: None (Preliminary result)   Collection Time: 07/19/15  5:39 PM  Result Value Ref Range Status   Specimen Description URINE, CLEAN CATCH  Final   Special Requests NONE  Final   Culture CULTURE REINCUBATED FOR BETTER GROWTH  Final   Report Status PENDING  Incomplete  Culture, blood (routine x 2) Call MD if unable to obtain prior to antibiotics being given     Status: None (Preliminary result)   Collection Time: 07/19/15  9:17 PM  Result Value Ref Range Status   Specimen Description BLOOD LEFT ARM  Final   Special Requests BOTTLES DRAWN AEROBIC AND ANAEROBIC 5CC  Final   Culture NO GROWTH < 12 HOURS  Final   Report Status PENDING  Incomplete  Culture, blood (routine x 2) Call MD if unable to obtain prior to antibiotics being given     Status: None (Preliminary result)   Collection Time: 07/19/15  9:22 PM  Result Value Ref Range Status   Specimen Description BLOOD LEFT HAND  Final   Special Requests BOTTLES DRAWN AEROBIC AND ANAEROBIC 2.5CC  Final   Culture NO GROWTH < 12 HOURS  Final   Report Status PENDING  Incomplete     Studies:  Recent x-ray studies  have been reviewed in detail by the Attending Physician  Scheduled Meds:  Scheduled Meds: . feeding supplement (PRO-STAT SUGAR FREE 64)  30 mL Per Tube  BID  . folic acid  1 mg Oral Daily  . ipratropium  0.5 mg Nebulization Q6H  . levalbuterol  1.25 mg Nebulization Once  . levalbuterol  1.25 mg Nebulization Q6H  . metoprolol tartrate  25 mg Per Tube BID  . morphine      . multivitamin with minerals  1 tablet Oral Daily  . predniSONE  40 mg Per Tube Q breakfast  . sodium chloride  3 mL Intravenous Q12H  . sodium chloride  3 mL Intravenous Q12H  . thiamine  100 mg Oral Daily    Time spent on care of this patient: 40 mins   WOODS, Geraldo Docker , MD  [facility] Office  [phone] Pager - [phone]  On-Call/Text Page:      [email or web address]      password [email or web address]  If 7PM-7AM, please contact night-coverage [email or web address] Password [email or web address] 07/20/2015, 8:01 PM   LOS: 1 day   Care during the described time interval was provided by me .  I have reviewed this patient's available data, including medical history, events of note, physical examination, and all test results as part of my evaluation. I have personally reviewed and interpreted all radiology studies.   Dia Crawford, MD [phone] Pager

## 2015-07-21 ENCOUNTER — Inpatient Hospital Stay (HOSPITAL_COMMUNITY): Payer: Medicare Other

## 2015-07-21 DIAGNOSIS — I509 Heart failure, unspecified: Secondary | ICD-10-CM

## 2015-07-21 DIAGNOSIS — J189 Pneumonia, unspecified organism: Secondary | ICD-10-CM

## 2015-07-21 LAB — COMPREHENSIVE METABOLIC PANEL
ALBUMIN: 1.9 g/dL — AB (ref 3.5–5.0)
ALT: 13 U/L — AB (ref 17–63)
AST: 25 U/L (ref 15–41)
Alkaline Phosphatase: 56 U/L (ref 38–126)
Anion gap: 8 (ref 5–15)
BUN: 39 mg/dL — ABNORMAL HIGH (ref 6–20)
CHLORIDE: 113 mmol/L — AB (ref 101–111)
CO2: 22 mmol/L (ref 22–32)
CREATININE: 1.82 mg/dL — AB (ref 0.61–1.24)
Calcium: 8.4 mg/dL — ABNORMAL LOW (ref 8.9–10.3)
GFR calc non Af Amer: 37 mL/min — ABNORMAL LOW (ref >60)
GFR, EST AFRICAN AMERICAN: 43 mL/min — AB (ref >60)
GLUCOSE: 120 mg/dL — AB (ref 65–99)
Potassium: 4.1 mmol/L (ref 3.5–5.1)
SODIUM: 143 mmol/L (ref 135–145)
Total Bilirubin: 0.8 mg/dL (ref 0.3–1.2)
Total Protein: 6 g/dL — ABNORMAL LOW (ref 6.5–8.1)

## 2015-07-21 LAB — GLUCOSE, CAPILLARY
GLUCOSE-CAPILLARY: 163 mg/dL — AB (ref 65–99)
GLUCOSE-CAPILLARY: 153 mg/dL — AB (ref 65–99)
GLUCOSE-CAPILLARY: 128 mg/dL — AB (ref 65–99)
Glucose-Capillary: 135 mg/dL — ABNORMAL HIGH (ref 65–99)
Glucose-Capillary: 169 mg/dL — ABNORMAL HIGH (ref 65–99)

## 2015-07-21 LAB — CBC WITH DIFFERENTIAL/PLATELET
BASOS PCT: 0 %
Basophils Absolute: 0 10*3/uL (ref 0.0–0.1)
EOS PCT: 0 %
Eosinophils Absolute: 0 10*3/uL (ref 0.0–0.7)
HEMATOCRIT: 23.3 % — AB (ref 39.0–52.0)
Hemoglobin: 7.9 g/dL — ABNORMAL LOW (ref 13.0–17.0)
Lymphocytes Relative: 10 %
Lymphs Abs: 0.9 10*3/uL (ref 0.7–4.0)
MCH: 30.2 pg (ref 26.0–34.0)
MCHC: 33.9 g/dL (ref 30.0–36.0)
MCV: 88.9 fL (ref 78.0–100.0)
MONO ABS: 1.1 10*3/uL — AB (ref 0.1–1.0)
Monocytes Relative: 12 %
NEUTROS ABS: 7.1 10*3/uL (ref 1.7–7.7)
NEUTROS PCT: 78 %
Platelets: 271 10*3/uL (ref 150–400)
RBC: 2.62 MIL/uL — ABNORMAL LOW (ref 4.22–5.81)
RDW: 15.3 % (ref 11.5–15.5)
WBC: 9.1 10*3/uL (ref 4.0–10.5)

## 2015-07-21 LAB — TROPONIN I: TROPONIN I: 0.04 ng/mL — AB (ref <0.031)

## 2015-07-21 LAB — MAGNESIUM: Magnesium: 1.9 mg/dL (ref 1.7–2.4)

## 2015-07-21 LAB — URINE CULTURE: Culture: 20000

## 2015-07-21 MED ORDER — JEVITY 1.2 CAL PO LIQD
1000.0000 mL | ORAL | Status: DC
  Administered 2015-07-21 – 2015-07-25 (×5): 1000 mL
  Filled 2015-07-21 (×13): qty 1000

## 2015-07-21 MED ORDER — IPRATROPIUM BROMIDE 0.02 % IN SOLN
0.5000 mg | Freq: Three times a day (TID) | RESPIRATORY_TRACT | Status: DC
  Administered 2015-07-21 – 2015-07-25 (×14): 0.5 mg via RESPIRATORY_TRACT
  Filled 2015-07-21 (×15): qty 2.5

## 2015-07-21 MED ORDER — FUROSEMIDE 10 MG/ML IJ SOLN
40.0000 mg | Freq: Every day | INTRAMUSCULAR | Status: DC
  Administered 2015-07-21 – 2015-07-22 (×2): 40 mg via INTRAVENOUS
  Filled 2015-07-21 (×3): qty 4

## 2015-07-21 MED ORDER — LEVALBUTEROL HCL 1.25 MG/0.5ML IN NEBU
1.2500 mg | INHALATION_SOLUTION | Freq: Three times a day (TID) | RESPIRATORY_TRACT | Status: DC
  Filled 2015-07-21 (×2): qty 6
  Filled 2015-07-21: qty 0.5
  Filled 2015-07-21 (×2): qty 6

## 2015-07-21 MED ORDER — LEVALBUTEROL HCL 1.25 MG/0.5ML IN NEBU
1.2500 mg | INHALATION_SOLUTION | Freq: Three times a day (TID) | RESPIRATORY_TRACT | Status: DC
  Administered 2015-07-21 – 2015-07-25 (×14): 1.25 mg via RESPIRATORY_TRACT
  Filled 2015-07-21 (×18): qty 0.5

## 2015-07-21 MED ORDER — PANTOPRAZOLE SODIUM 40 MG PO PACK
40.0000 mg | PACK | Freq: Two times a day (BID) | ORAL | Status: DC
  Administered 2015-07-21 – 2015-07-27 (×12): 40 mg
  Filled 2015-07-21 (×15): qty 20

## 2015-07-21 NOTE — Progress Notes (Signed)
RN removed BIPAP to preform mouth care. She stated patient was doing well off BIPAP. Pt seems to be doing well without at this time. RN will watch patient. She will call if patient begins having difficulty.

## 2015-07-21 NOTE — Progress Notes (Signed)
Dr. Thereasa Solo notified that the patient's wife has a number of questions she would like addressed by the physician.  Patient's spouse in agreement that she is having some anxiety related to her husband's care and condition.  Emotional support provided to spouse by me.  All questions she had answered to the best of my abilities.

## 2015-07-21 NOTE — Progress Notes (Signed)
Pt still off BIPAP. Pt is resting with 3L [place]. Sats 100%. No distress.

## 2015-07-21 NOTE — Progress Notes (Signed)
[place] TEAM 1 - Stepdown/ICU TEAM PROGRESS NOTE  Mitchell [name] [id number] DOB: 1951/05/25 DOA: 07/19/2015 PCP: No primary care provider on file.  Admit HPI / Brief Narrative: 64 yo MHx HTN, HLD, Gout,and ETOH abuse who was noted to have respiratory distress and odd resp sounds per wife 1 min after laying downearly am 10/11. EMS was activated. Wife attempted cpr at home. He was down for approximately 10 minutes when EMS arrived and noted he was in V fib. Shocked x 1 and epi x1 with return of circulation. Intubated and transported to [facility] ED. He was admitted, underwent a full workup, and was ultimately d/c to a SNF for rehab on 10/22.  Said SNF returned the pt to the hospital the evening of the same day w/ a reported "fever" of 38C.    HPI/Subjective: Patient has been intermittently agitated throughout the day as per his RN.  At the time of visit he is sedate and not able to provide history.  He does not appear to be uncomfortable bili is mildly tachypneic at 28 breaths per minute.  I spoke with his wife and son at bedside at length.  Assessment/Plan:   Acute Hypoxic Respiratory Failure - flash pulmonary edema +/- aspiration pneumonitis  -Currently requiring only 2 L nasal cannula with sats 97-99 percent - CXR noting "alveolar edema pattern" c/w simple pulmonary edema v/s aspiration pneumonitis - no temp >100.4 since admit - WBC quickly normalized - I suspect this represents flash pulmonary edema with a possible contribution from aspiration pneumonitis from aspiration of secretions - attempt to gently diurese as renal function while - keep nothing by mouth for now  Tracheal perforation (assumed traumatic intubation) -blood from NG tube when removed -No evidence of ongoing bleeding at this time  Post V fib arrest  -S/p Hypothermia protocol - anoxic brain injury    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 -EEG w/o seizure activity last admit  -Brain MRI  noted acute/subacute CVA   H/o DVT -patient currently cannot be anticoagulated secondary to recent tracheal tear  Chronic Severe Systolic and diastolic CHF -EF 0000000 via TTE 07/11/2015 w/ diffuse hypokinesis - f/u TTE 10/24 notes improvement in EF to 30-35% and grade 2 DD - wgt at time of d/c 76kg - presently 76.8kg - gently diurese as renal function allows  HTN -BP currently reasonably controlled   Acute Renal Failure/ ATN post arrest -crt stable at ~2.0  Nutrition -NPO - was cleared by SLP for D3 (mech soft) thin liquid diet prior to d/c on 10/21 -PEG in IR 10/21  Anemia  -Acute blood loss from tracheal tear + PEG placement - Hgb holding steady around 8.0  Thrombocytopenia  -resolved   ETOH abuse  -Should be well beyond the window for withdrawal  Code Status: FULL Family Communication: spoke w/ wife and son at bedside at length  Disposition Plan: SDU  Consultants: None   Procedures: 10/24 TTE  Antibiotics: ceftaz 10/22 > 10/23 vanc 10/22 > 10/23  DVT prophylaxis: SCDs  Objective: Blood pressure 124/72, pulse 96, temperature 98.6 F (37 C), temperature source Axillary, resp. rate 25, height 5\' 8"  (1.727 m), weight 76.8 kg (169 lb 5 oz), SpO2 97 %.  Intake/Output Summary (Last 24 hours) at 07/21/15 1549 Last data filed at 07/21/15 0600  Gross per 24 hour  Intake    510 ml  Output    500 ml  Net     10 ml   Exam: General: mild  tachypnea - no severe distress Lungs: fine crackles diffusely - no wheeze  Cardiovascular: Regular rhythm without murmur gallop or rub normal S1 and S2 Abdomen: Nontender, nondistended, soft, bowel sounds positive, no rebound, no ascites, no appreciable mass - PEG insertion clean and dry  Extremities: No significant cyanosis, clubbing, or edema bilateral lower extremities  Data Reviewed: Basic Metabolic Panel:  Recent Labs Lab 07/15/15 0410  07/18/15 0532 07/19/15 [id number] 07/19/15 1718 07/19/15 2122 07/20/15 0359  07/20/15 1630 07/21/15 0314  NA 146*  < > 142 141 143  --  137 139 143  K 3.7  < > 3.3* 2.9* 4.8  --  4.3 4.4 4.1  CL 111  < > 110 107 112*  --  109 110 113*  CO2 24  < > 22 22 21*  --  19* 20* 22  GLUCOSE 91  < > 80 108* 138*  --  130* 134* 120*  BUN 22*  < > 20 22* 22*  --  27* 34* 39*  CREATININE 1.99*  < > 1.96* 1.78* 1.68*  --  1.70* 1.74* 1.82*  CALCIUM 8.7*  < > 7.9* 8.0* 8.5*  --  7.8* 8.1* 8.4*  MG 1.8  < > 1.7 1.7  --  1.8  --  1.9 1.9  PHOS 3.4  --   --   --   --  3.0  --   --   --   < > = values in this interval not displayed.  CBC:  Recent Labs Lab 07/19/15 0624 07/19/15 1718 07/20/15 0359 07/20/15 1630 07/21/15 0314  WBC 14.2* 12.8* 11.7* 10.7* 9.1  NEUTROABS 11.5* 11.5* 10.1* 9.8* 7.1  HGB 8.0* 9.6* 7.7* 8.3* 7.9*  HCT 22.6* 27.8* 22.8* 23.5* 23.3*  MCV 87.6 87.4 88.0 86.7 88.9  PLT 212 216 229 275 271    Liver Function Tests:  Recent Labs Lab 07/19/15 0624 07/19/15 1718 07/20/15 0359 07/20/15 1630 07/21/15 0314  AST 27 37 23 32 25  ALT 12* 12* 11* 14* 13*  ALKPHOS 58 72 56 58 56  BILITOT 0.8 1.7* 0.9 0.8 0.8  PROT 5.5* 6.8 6.1* 6.9 6.0*  ALBUMIN 1.8* 2.1* 1.9* 2.0* 1.9*    Coags:  Recent Labs Lab 07/17/15 0345  INR 1.31    Recent Labs Lab 07/17/15 0345  APTT 41*    Cardiac Enzymes:  Recent Labs Lab 07/20/15 1630 07/20/15 2016 07/21/15 0314  TROPONINI 0.05* 0.06* 0.04*    CBG:  Recent Labs Lab 07/18/15 1619 07/18/15 2046 07/19/15 0729 07/19/15 1105 07/21/15 1115  GLUCAP 101* 108* 109* 104* 135*    Recent Results (from the past 240 hour(s))  Culture, blood (routine x 2)     Status: None   Collection Time: 07/15/15  8:24 PM  Result Value Ref Range Status   Specimen Description BLOOD RIGHT ARM  Final   Special Requests IN PEDIATRIC BOTTLE 1.5CC  Final   Culture NO GROWTH 5 DAYS  Final   Report Status 07/20/2015 FINAL  Final  Culture, blood (routine x 2)     Status: None   Collection Time: 07/15/15  8:41 PM   Result Value Ref Range Status   Specimen Description BLOOD LEFT HAND  Final   Special Requests IN PEDIATRIC BOTTLE [facility]  Final   Culture NO GROWTH 5 DAYS  Final   Report Status 07/20/2015 FINAL  Final  Blood Culture (routine x 2)     Status: None (Preliminary result)   Collection Time: 07/19/15  5:18 PM  Result Value Ref Range Status   Specimen Description BLOOD LEFT HAND  Final   Special Requests BOTTLES DRAWN AEROBIC AND ANAEROBIC 5CC  Final   Culture NO GROWTH 2 DAYS  Final   Report Status PENDING  Incomplete  Blood Culture (routine x 2)     Status: None (Preliminary result)   Collection Time: 07/19/15  5:20 PM  Result Value Ref Range Status   Specimen Description BLOOD RIGHT ANTECUBITAL  Final   Special Requests BOTTLES DRAWN AEROBIC ONLY 5CC  Final   Culture NO GROWTH 2 DAYS  Final   Report Status PENDING  Incomplete  Urine culture     Status: None   Collection Time: 07/19/15  5:39 PM  Result Value Ref Range Status   Specimen Description URINE, CLEAN CATCH  Final   Special Requests NONE  Final   Culture 20,000 COLONIES/mL YEAST  Final   Report Status 07/21/2015 FINAL  Final  Culture, blood (routine x 2) Call MD if unable to obtain prior to antibiotics being given     Status: None (Preliminary result)   Collection Time: 07/19/15  9:17 PM  Result Value Ref Range Status   Specimen Description BLOOD LEFT ARM  Final   Special Requests BOTTLES DRAWN AEROBIC AND ANAEROBIC 5CC  Final   Culture NO GROWTH 2 DAYS  Final   Report Status PENDING  Incomplete  Culture, blood (routine x 2) Call MD if unable to obtain prior to antibiotics being given     Status: None (Preliminary result)   Collection Time: 07/19/15  9:22 PM  Result Value Ref Range Status   Specimen Description BLOOD LEFT HAND  Final   Special Requests BOTTLES DRAWN AEROBIC AND ANAEROBIC 2.5CC  Final   Culture NO GROWTH 2 DAYS  Final   Report Status PENDING  Incomplete     Studies:   Recent x-ray studies have been  reviewed in detail by the Attending Physician  Scheduled Meds:  Scheduled Meds: . feeding supplement (PRO-STAT SUGAR FREE 64)  30 mL Per Tube BID  . folic acid  1 mg Oral Daily  . ipratropium  0.5 mg Nebulization TID  . levalbuterol  1.25 mg Nebulization TID  . metoprolol tartrate  25 mg Per Tube BID  . multivitamin with minerals  1 tablet Oral Daily  . pantoprazole (PROTONIX) IV  40 mg Intravenous Q12H  . predniSONE  40 mg Per Tube Q breakfast  . sodium chloride  3 mL Intravenous Q12H  . sodium chloride  3 mL Intravenous Q12H  . thiamine  100 mg Oral Daily    Time spent on care of this patient: 35 mins   MCCLUNG,JEFFREY T , MD   [facility] Office  [phone] Pager - Text Page per Shea Evans as per below:  On-Call/Text Page:      [email or web address]      password [email or web address]  If 7PM-7AM, please contact night-coverage [email or web address] Password [email or web address] 07/21/2015, 3:49 PM   LOS: 2 days

## 2015-07-21 NOTE — Progress Notes (Signed)
Pt's CBG = 135.

## 2015-07-21 NOTE — Progress Notes (Signed)
Initial Nutrition Assessment  DOCUMENTATION CODES:   Not applicable  INTERVENTION:    Continue TF via PEG with Jevity 1.2, increase by 10 ml every 4 hours to goal rate of 60 ml/h with Prostat 30 ml BID to provide 1928 kcals, 110 gm protein, 1166 ml free water daily.  NUTRITION DIAGNOSIS:   Inadequate oral intake related to dysphagia as evidenced by NPO status.  GOAL:   Patient will meet greater than or equal to 90% of their needs  MONITOR:   TF tolerance, Weight trends, Labs  REASON FOR ASSESSMENT:   Consult Enteral/tube feeding initiation and management  ASSESSMENT:   64 y.o. male with a past medical history of hypertension, alcohol abuse, gout, hyperlipidemia, status post cardiac arrest with ventricular fibrillation (down for about 10 minutes on the field), status post CVA, status post community-acquired pneumonia who was discharged from the hospital earlier to [facility] rehabilitation facility, but returned to the hospital via the emergency department due to a 38C fever when he arrived to the skilled nursing facility.   PEG was placed on 10/20 during recent admission at [facility]. Patient is currently receiving Jevity 1.2 via PEG at 30 ml/h to provide 864 kcals, 40 gm protein, 583 ml free water daily. Received MD Consult for TF initiation and management.  Currently NPO. Wife concerned that during last admission patient received a whole cup of water and applesauce and it caused him to cough.  Diet Order:  Diet NPO time specified  Skin:  Reviewed, no issues  Last BM:  unknown  Height:   Ht Readings from Last 1 Encounters:  07/20/15 5\' 8"  (1.727 m)    Weight:   Wt Readings from Last 1 Encounters:  07/21/15 169 lb 5 oz (76.8 kg)    Ideal Body Weight:  70 kg  BMI:  Body mass index is 25.75 kg/(m^2).  Estimated Nutritional Needs:   Kcal:  1800-2000  Protein:  100-120 gm  Fluid:  > 1.8 L  EDUCATION NEEDS:   No education needs identified at this  time  Molli Barrows, [place], [place], [place] Pager [phone] After Hours Pager [phone]

## 2015-07-21 NOTE — Progress Notes (Signed)
*  PRELIMINARY RESULTS* Echocardiogram 2D Echocardiogram has been performed.  Leavy Cella 07/21/2015, 12:17 PM

## 2015-07-21 NOTE — Progress Notes (Signed)
CBG 163 

## 2015-07-22 ENCOUNTER — Inpatient Hospital Stay (HOSPITAL_COMMUNITY): Payer: Medicare Other

## 2015-07-22 DIAGNOSIS — F1010 Alcohol abuse, uncomplicated: Secondary | ICD-10-CM

## 2015-07-22 DIAGNOSIS — J690 Pneumonitis due to inhalation of food and vomit: Secondary | ICD-10-CM | POA: Diagnosis present

## 2015-07-22 LAB — COMPREHENSIVE METABOLIC PANEL
ALBUMIN: 2 g/dL — AB (ref 3.5–5.0)
ALK PHOS: 65 U/L (ref 38–126)
ALT: 16 U/L — ABNORMAL LOW (ref 17–63)
ANION GAP: 11 (ref 5–15)
AST: 37 U/L (ref 15–41)
BILIRUBIN TOTAL: 0.7 mg/dL (ref 0.3–1.2)
BUN: 45 mg/dL — AB (ref 6–20)
CALCIUM: 8.5 mg/dL — AB (ref 8.9–10.3)
CO2: 22 mmol/L (ref 22–32)
Chloride: 111 mmol/L (ref 101–111)
Creatinine, Ser: 1.72 mg/dL — ABNORMAL HIGH (ref 0.61–1.24)
GFR calc Af Amer: 46 mL/min — ABNORMAL LOW (ref >60)
GFR, EST NON AFRICAN AMERICAN: 40 mL/min — AB (ref >60)
GLUCOSE: 123 mg/dL — AB (ref 65–99)
Potassium: 3.9 mmol/L (ref 3.5–5.1)
Sodium: 144 mmol/L (ref 135–145)
TOTAL PROTEIN: 6 g/dL — AB (ref 6.5–8.1)

## 2015-07-22 LAB — GLUCOSE, CAPILLARY
GLUCOSE-CAPILLARY: 111 mg/dL — AB (ref 65–99)
GLUCOSE-CAPILLARY: 120 mg/dL — AB (ref 65–99)
GLUCOSE-CAPILLARY: 132 mg/dL — AB (ref 65–99)
GLUCOSE-CAPILLARY: 161 mg/dL — AB (ref 65–99)
Glucose-Capillary: 155 mg/dL — ABNORMAL HIGH (ref 65–99)
Glucose-Capillary: 159 mg/dL — ABNORMAL HIGH (ref 65–99)

## 2015-07-22 LAB — LEGIONELLA PNEUMOPHILA SEROGP 1 UR AG: L. PNEUMOPHILA SEROGP 1 UR AG: NEGATIVE

## 2015-07-22 LAB — CBC
HCT: 24.7 % — ABNORMAL LOW (ref 39.0–52.0)
HEMOGLOBIN: 8.3 g/dL — AB (ref 13.0–17.0)
MCH: 30.3 pg (ref 26.0–34.0)
MCHC: 33.6 g/dL (ref 30.0–36.0)
MCV: 90.1 fL (ref 78.0–100.0)
PLATELETS: 274 10*3/uL (ref 150–400)
RBC: 2.74 MIL/uL — AB (ref 4.22–5.81)
RDW: 15.4 % (ref 11.5–15.5)
WBC: 9.2 10*3/uL (ref 4.0–10.5)

## 2015-07-22 LAB — RAPID URINE DRUG SCREEN, HOSP PERFORMED
AMPHETAMINES: NOT DETECTED
Barbiturates: NOT DETECTED
Benzodiazepines: POSITIVE — AB
Cocaine: NOT DETECTED
OPIATES: NOT DETECTED
TETRAHYDROCANNABINOL: NOT DETECTED

## 2015-07-22 LAB — ETHANOL

## 2015-07-22 LAB — MAGNESIUM: MAGNESIUM: 2 mg/dL (ref 1.7–2.4)

## 2015-07-22 NOTE — Progress Notes (Signed)
Pt's CBG 159.

## 2015-07-22 NOTE — Progress Notes (Signed)
Pt attempting to climb out of bed, stating he was "going home".  Facial tremors noted.  Pt denies auditory or visual hallucinations.  Pt confused to his location, thought he was in a "[place]".  Pt swinging legs at writer and attempting to get mitts off of  hands.  CIWA protocol implemented; pt medicated.

## 2015-07-22 NOTE — Care Management Important Message (Signed)
Important Message  Patient Details  Name: Mitchell [name] MRN: [id number] Date of Birth: 11-22-1950   Medicare Important Message Given:  Yes-second notification given    Nathen May 07/22/2015, 1:54 PM

## 2015-07-22 NOTE — Progress Notes (Signed)
[place] TEAM 1 - Stepdown/ICU TEAM Progress Note  Mitchell [name] [id number] DOB: 03-10-1951 DOA: 07/19/2015 PCP: No primary care provider on file.  Admit HPI / Brief Narrative: 64 yo BM  PMHx HTN, HLD, Gout,  ETOH Abuse  Who was noted to have respiratory distress and odd resp sounds per wife 1 min after laying back down early am 10/11 and EMS was activated. Wife attempted cpr at home. He was down for approximately 10 minutes AND when EMS he was in V fib. Shocked x 1 and epi x1 with return of circulation. Intubated and transported to [facility] ED.    HPI/Subjective: 10/25  A/O 1 (does not know where, when, why).RN Judeen Hammans is suspicious that FRIENDS may be bringing patient alcohol/drugs. states patient has been waxing and waning being agitated, as if withdrawing from alcohol or drugs.Negative N/V,negative CP.     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 - EEG w/o seizure activity -Brain MRI; shows acute/subacute stroke see results below  AMS -Today patient is significantly altered as compared to 10/23. Per RN there were friends who came to visit patient yesterday when wife was not present, after which patient began to exhibit signs and symptoms consistent with withdrawal. Patient stated to RN that people were sneaking drugs/alcohol tonight (his patient confused to timeline?). -Spoke with Mrs. Kraus voicing or concerns and she agreed to limit all visitation to herself, Evander Ryce, [place] pending -ETOH pending  Acute Respiratory Failure Post secondary to flash pulmonary edema/aspiration? -Afebrile overnight, negative leukocytosis,I suspect this represents flash pulmonary edema with a possible contribution from aspiration pneumonitis from aspiration of secretions  -EKG appeared to EKG from 10/12 shows continue prolonged QT interval, ST-T wave abnormalities consistent with inferior lateral ischemia. Secondary to demand ischemia? -Slight bump in troponin most likely  secondary to demand ischemia -Continue Nothing by mouth/hold tube feeds  Tracheal perforation (ill assume traumatic intubation) - S/P IR PEG placement -Hold all anticoagulants  CAP/Aspiration pneumonitis? -Completed 10 days of tx prior to discharge on 10/22 -CAP resolved, when compared to previous PCXR; PCXR from 10/25 pulmonary edema improved but still present  Post V fib arrest  -S/p Hypothermia protocol   H/o DVT -cannot be anticoagulated secondary to tracheal tear  Chronic Systolic CHF -[id number] CVP= 8 mmHg -Transfuse for hemoglobin<8 -10/20 transfuse 1 unit PRBC -Lasix 40 mg daily -Continue metoprolol 25 mg  BID per tube -Strict in and out since admission - 1.7 ml -Daily a.m. Weight; 10/25 bed weight= 77.1 kg  Pulmonary edema -Lasix IV 40 mg daily  HTN -See systolic CHF  Acute Renal Failure/ ATN post arrest -Cr trending down with diuretic use   Hypokalemia -Potassium goal> 4  Hypomagnesemia -Magnesium goal> 2  Nutrition -10/21 Passed MBS; however aspirated the next day, continue NPO -10/21 S/P PEG.; Continue tube feeds (Jevity; goal rate 60 ml/h)  Anemia  -Acute blood loss from tracheal tear and placement of PEG tube -Stable -See chronic CHF  Thrombocytopenia (ETOH, dilution) -Resolved   ETOH abuse  -Continue CIWA protocol use  Gout flare -Uric acid goal<6 -10/18 uric acid= 9.4 -Prednisone 40 mg daily for 7 days (will allow patients renal function to improve)     Code Status: DO NOT RESUSCITATE Family Communication: Wife present at time of exam Disposition Plan:  SNF    Consultants: Dr.Wesam Kathryne Sharper [facility])   Procedure/Significant Events: 10/11 ct head - atrophy mild, neg acute 10/11 ct angio chest - There is extensive air in the  neck and upper mediastinal region. Air is noted just anterior to the trachea in the lower neck region 10/11 EEG - markedly abnormal due to severe attenuation of background rhythms. No seizure activity. 10/11 -  Admitted post cardiac arrest & cooling initiated 10/12 - Rewarming 10/13- awake, followed commands, normothermia stopped for severe shivering  10/14 echocardiogram; - LVEF 20%,Diffuse hypokinesis 10/20 MRI brain without contrast; Acute/subacute linear infarct in the subcortical white matter of the left frontal lobe. -Minimal T2 changes w/i brainstem suggesting microvascular disease. 10/20 S/P IR GASTROSTOMY TUBE placed 10/20 transfuse 1 unit PRBC   Culture 10/11 MRSA by PCR negative 10/18 blood right arm/left hand NGTD   Antibiotics: Unasyn 10/11>>10/16 Rocephin 10/16>>> stopped 10/19 Cefazolin 10/20 one dose Fortaz 10/22>> stopped 10/23 Vancomycin 10/22>> stopped 10/23   DVT prophylaxis: SCD   Devices   LINES / TUBES:  CVL ij 10/11>>>     Continuous Infusions: . feeding supplement (JEVITY 1.2 CAL) 1,000 mL (07/22/15 0700)    Objective: VITAL SIGNS: Temp: 99.3 F (37.4 C) (10/25 0815) Temp Source: Oral (10/25 0815) BP: 151/82 mmHg (10/25 0956) Pulse Rate: 99 (10/25 0956) SPO2; FIO2:   Intake/Output Summary (Last 24 hours) at 07/22/15 1135 Last data filed at 07/22/15 1110  Gross per 24 hour  Intake   1210 ml  Output   1500 ml  Net   -290 ml     Exam: General:A/O 1 (does not know where, when, why), negative acute respiratory distress Eyes: ,negative scleral hemorrhage ENT: Negative Runny nose, negative gingival bleeding, Neck:  Negative scars, masses, torticollis, lymphadenopathy, JVD, positive emphysema Rt>>Lt (improving) Lungs: clear to auscultation bilateral, negative wheezing left lung fields, negative crackles Cardiovascular: Regular rhythm and rate, without murmur gallop or rub normal S1 and S2 Abdomen:negative abdominal pain, nondistended, positive soft, bowel sounds, no rebound, no ascites, no appreciable mass, PEG tube in place negative sign of infection Extremities: bilateral wrists swollen warm to the touch, negative pain Psychiatric:   Negative depression, negative anxiety, negative fatigue, negative mania  Neurologic:  Cranial nerves II through XII intact, tongue/uvula midline, RUE/RLE strength 5/5 (fine motor function decrease), Left hemiparesis, sensation intact throughout, positive mild dysarthria, negative expressive aphasia, negative receptive aphasia.   Data Reviewed: Basic Metabolic Panel:  Recent Labs Lab 07/19/15 0624 07/19/15 1718 07/19/15 2122 07/20/15 0359 07/20/15 1630 07/21/15 0314 07/22/15 0319  NA 141 143  --  137 139 143 144  K 2.9* 4.8  --  4.3 4.4 4.1 3.9  CL 107 112*  --  109 110 113* 111  CO2 22 21*  --  19* 20* 22 22  GLUCOSE 108* 138*  --  130* 134* 120* 123*  BUN 22* 22*  --  27* 34* 39* 45*  CREATININE 1.78* 1.68*  --  1.70* 1.74* 1.82* 1.72*  CALCIUM 8.0* 8.5*  --  7.8* 8.1* 8.4* 8.5*  MG 1.7  --  1.8  --  1.9 1.9 2.0  PHOS  --   --  3.0  --   --   --   --    Liver Function Tests:  Recent Labs Lab 07/19/15 1718 07/20/15 0359 07/20/15 1630 07/21/15 0314 07/22/15 0319  AST 37 23 32 25 37  ALT 12* 11* 14* 13* 16*  ALKPHOS 72 56 58 56 65  BILITOT 1.7* 0.9 0.8 0.8 0.7  PROT 6.8 6.1* 6.9 6.0* 6.0*  ALBUMIN 2.1* 1.9* 2.0* 1.9* 2.0*   No results for input(s): LIPASE, AMYLASE in the last 168 hours. No  results for input(s): AMMONIA in the last 168 hours. CBC:  Recent Labs Lab 07/19/15 0624 07/19/15 1718 07/20/15 0359 07/20/15 1630 07/21/15 0314 07/22/15 0319  WBC 14.2* 12.8* 11.7* 10.7* 9.1 9.2  NEUTROABS 11.5* 11.5* 10.1* 9.8* 7.1  --   HGB 8.0* 9.6* 7.7* 8.3* 7.9* 8.3*  HCT 22.6* 27.8* 22.8* 23.5* 23.3* 24.7*  MCV 87.6 87.4 88.0 86.7 88.9 90.1  PLT 212 216 229 275 271 274   Cardiac Enzymes:  Recent Labs Lab 07/20/15 1630 07/20/15 2016 07/21/15 0314  TROPONINI 0.05* 0.06* 0.04*   BNP (last 3 results)  Recent Labs  07/19/15 1718  BNP 3064.0*    ProBNP (last 3 results) No results for input(s): PROBNP in the last 8760 hours.  CBG:  Recent Labs Lab  07/21/15 1740 07/21/15 2022 07/21/15 2312 07/22/15 0319 07/22/15 0738  GLUCAP 163* 153* 128* 111* 120*    Recent Results (from the past 240 hour(s))  Culture, blood (routine x 2)     Status: None   Collection Time: 07/15/15  8:24 PM  Result Value Ref Range Status   Specimen Description BLOOD RIGHT ARM  Final   Special Requests IN PEDIATRIC BOTTLE 1.5CC  Final   Culture NO GROWTH 5 DAYS  Final   Report Status 07/20/2015 FINAL  Final  Culture, blood (routine x 2)     Status: None   Collection Time: 07/15/15  8:41 PM  Result Value Ref Range Status   Specimen Description BLOOD LEFT HAND  Final   Special Requests IN PEDIATRIC BOTTLE 2CC  Final   Culture NO GROWTH 5 DAYS  Final   Report Status 07/20/2015 FINAL  Final  Blood Culture (routine x 2)     Status: None (Preliminary result)   Collection Time: 07/19/15  5:18 PM  Result Value Ref Range Status   Specimen Description BLOOD LEFT HAND  Final   Special Requests BOTTLES DRAWN AEROBIC AND ANAEROBIC 5CC  Final   Culture NO GROWTH 2 DAYS  Final   Report Status PENDING  Incomplete  Blood Culture (routine x 2)     Status: None (Preliminary result)   Collection Time: 07/19/15  5:20 PM  Result Value Ref Range Status   Specimen Description BLOOD RIGHT ANTECUBITAL  Final   Special Requests BOTTLES DRAWN AEROBIC ONLY 5CC  Final   Culture NO GROWTH 2 DAYS  Final   Report Status PENDING  Incomplete  Urine culture     Status: None   Collection Time: 07/19/15  5:39 PM  Result Value Ref Range Status   Specimen Description URINE, CLEAN CATCH  Final   Special Requests NONE  Final   Culture 20,000 COLONIES/mL YEAST  Final   Report Status 07/21/2015 FINAL  Final  Culture, blood (routine x 2) Call MD if unable to obtain prior to antibiotics being given     Status: None (Preliminary result)   Collection Time: 07/19/15  9:17 PM  Result Value Ref Range Status   Specimen Description BLOOD LEFT ARM  Final   Special Requests BOTTLES DRAWN AEROBIC  AND ANAEROBIC 5CC  Final   Culture NO GROWTH 2 DAYS  Final   Report Status PENDING  Incomplete  Culture, blood (routine x 2) Call MD if unable to obtain prior to antibiotics being given     Status: None (Preliminary result)   Collection Time: 07/19/15  9:22 PM  Result Value Ref Range Status   Specimen Description BLOOD LEFT HAND  Final   Special Requests BOTTLES DRAWN  AEROBIC AND ANAEROBIC 2.5CC  Final   Culture NO GROWTH 2 DAYS  Final   Report Status PENDING  Incomplete     Studies:  Recent x-ray studies have been reviewed in detail by the Attending Physician  Scheduled Meds:  Scheduled Meds: . feeding supplement (PRO-STAT SUGAR FREE 64)  30 mL Per Tube BID  . folic acid  1 mg Oral Daily  . furosemide  40 mg Intravenous Daily  . ipratropium  0.5 mg Nebulization TID  . levalbuterol  1.25 mg Nebulization TID  . metoprolol tartrate  25 mg Per Tube BID  . multivitamin with minerals  1 tablet Oral Daily  . pantoprazole sodium  40 mg Per Tube BID  . predniSONE  40 mg Per Tube Q breakfast  . thiamine  100 mg Oral Daily    Time spent on care of this patient: 40 mins   Sheva Mcdougle, Geraldo Docker , MD  [facility] Office  [phone] Pager - [phone]  On-Call/Text Page:      [email or web address]      password [email or web address]  If 7PM-7AM, please contact night-coverage [email or web address] Password [email or web address] 07/22/2015, 11:35 AM   LOS: 3 days   Care during the described time interval was provided by me .  I have reviewed this patient's available data, including medical history, events of note, physical examination, and all test results as part of my evaluation. I have personally reviewed and interpreted all radiology studies.   Dia Crawford, MD [phone] Pager

## 2015-07-22 NOTE — Clinical Social Work Note (Signed)
CSW talked with attending and patient not yet ready for discharge to [facility]. Facility admissions director informed. CSW will continue to follow and facilitate discharge to SNF when stable.   Jakelyn Squyres Givens, MSW, LCSW Licensed Clinical Social Worker [place] [phone]

## 2015-07-22 NOTE — Progress Notes (Signed)
[facility] CBG 132.

## 2015-07-22 NOTE — Care Management Note (Signed)
Case Management Note  Patient Details  Name: Mitchell [name] MRN: [id number] Date of Birth: 05-Aug-1951  Subjective/Objective:   Admitted with  Acute Hypoxic Respiratory Failure - flash pulmonary edema +/- aspiration pneumonitis from SNF/[facility].        Action/Plan: Return to SNF when medically stable. CM to f/u with d/c disposition.  Expected Discharge Date:                  Expected Discharge Plan:  Skilled Nursing Facility  In-House Referral:  Clinical Social Work  Discharge planning Services  CM Consult  Post Acute Care Choice:    Choice offered to:     DME Arranged:    DME Agency:     HH Arranged:    [place] Agency:     Status of Service:  Completed, signed off  Medicare Important Message Given:    Date Medicare IM Given:    Medicare IM give by:    Date Additional Medicare IM Given:    Additional Medicare Important Message give by:     If discussed at [place] of Stay Meetings, dates discussed:    Additional Comments:  Sharin Mons, RN,BSN,CM  07/22/2015, 10:42 AM

## 2015-07-22 NOTE — Progress Notes (Signed)
Patient's brother, Percell Miller, and his wife, Stanton Kidney, to bedside to visit.  I explained we are limiting visitors to patient's wife, daughter and son.  Both visitors very understanding and Percell Miller expressed his appreciation for the care his brother is receiving.

## 2015-07-23 ENCOUNTER — Inpatient Hospital Stay (HOSPITAL_COMMUNITY): Payer: Medicare Other

## 2015-07-23 DIAGNOSIS — I82403 Acute embolism and thrombosis of unspecified deep veins of lower extremity, bilateral: Secondary | ICD-10-CM

## 2015-07-23 LAB — GLUCOSE, CAPILLARY
GLUCOSE-CAPILLARY: 203 mg/dL — AB (ref 65–99)
Glucose-Capillary: 125 mg/dL — ABNORMAL HIGH (ref 65–99)
Glucose-Capillary: 118 mg/dL — ABNORMAL HIGH (ref 65–99)
Glucose-Capillary: 186 mg/dL — ABNORMAL HIGH (ref 65–99)
Glucose-Capillary: 200 mg/dL — ABNORMAL HIGH (ref 65–99)
Glucose-Capillary: 137 mg/dL — ABNORMAL HIGH (ref 65–99)

## 2015-07-23 LAB — CBC
HCT: 26.4 % — ABNORMAL LOW (ref 39.0–52.0)
HEMOGLOBIN: 8.6 g/dL — AB (ref 13.0–17.0)
MCH: 29.6 pg (ref 26.0–34.0)
MCHC: 32.6 g/dL (ref 30.0–36.0)
MCV: 90.7 fL (ref 78.0–100.0)
PLATELETS: 327 10*3/uL (ref 150–400)
RBC: 2.91 MIL/uL — ABNORMAL LOW (ref 4.22–5.81)
RDW: 15.4 % (ref 11.5–15.5)
WBC: 9.3 10*3/uL (ref 4.0–10.5)

## 2015-07-23 LAB — MAGNESIUM: Magnesium: 1.9 mg/dL (ref 1.7–2.4)

## 2015-07-23 LAB — D-DIMER, QUANTITATIVE (NOT AT ARMC): D DIMER QUANT: 15.94 ug{FEU}/mL — AB (ref 0.00–0.48)

## 2015-07-23 MED ORDER — HEPARIN (PORCINE) IN NACL 100-0.45 UNIT/ML-% IJ SOLN
1400.0000 [IU]/h | INTRAMUSCULAR | Status: AC
  Administered 2015-07-23: 1300 [IU]/h via INTRAVENOUS
  Filled 2015-07-23 (×3): qty 250

## 2015-07-23 MED ORDER — PREDNISONE 20 MG PO TABS
20.0000 mg | ORAL_TABLET | Freq: Every day | ORAL | Status: DC
  Administered 2015-07-24 – 2015-07-25 (×2): 20 mg
  Filled 2015-07-23 (×3): qty 1

## 2015-07-23 MED ORDER — CHLORHEXIDINE GLUCONATE 0.12 % MT SOLN
15.0000 mL | Freq: Two times a day (BID) | OROMUCOSAL | Status: DC
  Administered 2015-07-23 – 2015-07-26 (×7): 15 mL via OROMUCOSAL
  Filled 2015-07-23 (×9): qty 15

## 2015-07-23 MED ORDER — HEPARIN BOLUS VIA INFUSION
4000.0000 [IU] | Freq: Once | INTRAVENOUS | Status: AC
  Administered 2015-07-23: 4000 [IU] via INTRAVENOUS
  Filled 2015-07-23: qty 4000

## 2015-07-23 MED ORDER — ADULT MULTIVITAMIN W/MINERALS CH
1.0000 | ORAL_TABLET | Freq: Every day | ORAL | Status: DC
  Administered 2015-07-24 – 2015-07-27 (×4): 1
  Filled 2015-07-23 (×4): qty 1

## 2015-07-23 MED ORDER — FUROSEMIDE 20 MG PO TABS: 20.0000 mg | ORAL_TABLET | Freq: Every day | ORAL | Status: DC

## 2015-07-23 MED ORDER — FOLIC ACID 1 MG PO TABS
1.0000 mg | ORAL_TABLET | Freq: Every day | ORAL | Status: DC
  Administered 2015-07-24 – 2015-07-27 (×4): 1 mg
  Filled 2015-07-23 (×4): qty 1

## 2015-07-23 MED ORDER — FUROSEMIDE 20 MG PO TABS
20.0000 mg | ORAL_TABLET | Freq: Every day | ORAL | Status: DC
  Administered 2015-07-24 – 2015-07-26 (×3): 20 mg
  Filled 2015-07-23 (×3): qty 1

## 2015-07-23 MED ORDER — VITAMIN B-1 100 MG PO TABS
100.0000 mg | ORAL_TABLET | Freq: Every day | ORAL | Status: DC
  Administered 2015-07-24 – 2015-07-27 (×4): 100 mg
  Filled 2015-07-23 (×4): qty 1

## 2015-07-23 MED ORDER — CETYLPYRIDINIUM CHLORIDE 0.05 % MT LIQD
7.0000 mL | Freq: Two times a day (BID) | OROMUCOSAL | Status: DC
  Administered 2015-07-23 – 2015-07-26 (×7): 7 mL via OROMUCOSAL

## 2015-07-23 NOTE — Progress Notes (Signed)
Preliminary results by tech - Venous Duplex Lower Ext. Completed. Positive for chronic appearing deep vein thrombosis in the right femoral vein and popliteal vein and the left common femoral and femoral veins. Results given to patient's nurse. Oda Cogan, BS, RDMS, RVT

## 2015-07-23 NOTE — Progress Notes (Signed)
ANTICOAGULATION CONSULT NOTE - Initial Consult  Pharmacy Consult for Heparin Indication: PE  Allergies  Allergen Reactions  . Hydrochlorothiazide     REACTION: precipitates gout  . Pseudoephedrine     REACTION: rash    Patient Measurements: Height: 5\' 8"  (172.7 cm) Weight: 167 lb 8.8 oz (76 kg) IBW/kg (Calculated) : 68.4  Vital Signs: Temp: 98.6 F (37 C) (10/26 1300) Temp Source: Axillary (10/26 1300) BP: 136/81 mmHg (10/26 0400) Pulse Rate: 104 (10/26 0500)  Labs:  Recent Labs  07/20/15 1630 07/20/15 2016 07/21/15 0314 07/22/15 0319  HGB 8.3*  --  7.9* 8.3*  HCT 23.5*  --  23.3* 24.7*  PLT 275  --  271 274  CREATININE 1.74*  --  1.82* 1.72*  TROPONINI 0.05* 0.06* 0.04*  --     Estimated Creatinine Clearance: 41.4 mL/min (by C-G formula based on Cr of 1.72).   Medical History: Past Medical History  Diagnosis Date  . Hypertension   . Alcohol abuse   . Gout   . Hyperlipemia     Assessment: 64 yo M presents on 10/22 with fever and SOB. Was ready for discharge but now found to have possible PE. Pharmacy consulted to start heparin. Hgb 8.3, plts wnl. No s/s of bleed.  Goal of Therapy:  Heparin level 0.3-0.7 units/ml Monitor platelets by anticoagulation protocol: Yes   Plan:  Give heparin 4,000 unit BOLUS Start heparin gtt at 1,300 units/hr Check 6 hr HL Monitor daily HL, CBC, s/s of bleed  Elenor Quinones, PharmD Clinical Pharmacist Pager [phone] 07/23/2015 1:48 PM

## 2015-07-23 NOTE — Progress Notes (Signed)
[facility] TEAM 1 - Stepdown/ICU TEAM PROGRESS NOTE  Mitchell [name] [id number] DOB: 04/08/1951 DOA: 07/19/2015 PCP: No primary care provider on file.  Admit HPI / Brief Narrative: 64 yo MHx HTN, HLD, Gout,and ETOH abuse who was noted to have respiratory distress and odd resp sounds per wife 1 min after laying downearly am 10/11. EMS was activated. Wife attempted cpr at home. He was down for approximately 10 minutes when EMS arrived and noted he was in V fib. Shocked x 1 and epi x1 with return of circulation. Intubated and transported to [facility] ED. He was admitted, underwent a full workup, and was ultimately d/c to a SNF for rehab on 10/22.  Said SNF returned the pt to the hospital the evening of the same day w/ a reported "fever" of 38C.    HPI/Subjective: The patient is sedate and non-communicative at the time of visit today.  He is in sinus tachycardia with heart rate approximately 120 bpm.  He does not appear to be uncomfortable.  He does not appear to be in acute respiratory distress.  There is no family present at the time my exam.  Assessment/Plan:   Acute Hypoxic Respiratory Failure - flash pulmonary edema +/- aspiration pneumonitis  -Has been weaned to room air with sats 97-99 percent - CXR noting "alveolar edema pattern" c/w simple pulmonary edema v/s aspiration pneumonitis - no temp >100.4 since admit - WBC quickly normalized - I suspect this represents flash pulmonary edema with a possible contribution from aspiration pneumonitis from aspiration of secretions - attempt to gently diurese as renal function will allow - keep nothing by mouth for now  Sinus tachycardia -In a patient with a history of DVT and a recent prolonged hospitalization during which pharmacologic DVT prophylaxis cannot be administered this raises concern for the possibility of pulmonary embolism - a d-dimer was markedly elevated today - CT angiogram cannot be accomplished given his renal insufficiency - I will check  lower extremity venous duplex to rule out DVT - if no DVT is evident we will need to pursue further with possible VQ scan or CT angiogram if renal function improves  Tracheal perforation (assumed traumatic intubation) -blood from NG tube when removed during last hospital stay -No evidence of ongoing bleeding at this time - given moderate to high pretest probability of current pulmonary embolism will initiate anticoagulation and follow Hgb closely   Post V fib arrest  -S/p Hypothermia protocol last admit - anoxic brain injury    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 -EEG w/o seizure activity last admit  -Brain MRI noted acute/subacute CVA during last admit   H/o DVT -does not appear that pt was on chronic anticoag at the time of his initial presentation to the hospital   Chronic Severe Systolic and diastolic CHF -EF 0000000 via TTE 07/11/2015 w/ diffuse hypokinesis - f/u TTE 10/24 notes improvement in EF to 30-35% and grade 2 DD - wgt at time of d/c 76kg - presently 76kg - gently diurese as renal function allows  HTN -BP currently reasonably controlled   Acute Renal Failure/ ATN post arrest -crt stable at ~2.0  Nutrition -NPO for now - was cleared by SLP for D3 (mech soft) thin liquid diet prior to d/c on 10/21 but then promptly aspirated -PEG in IR 10/21  Anemia  -Acute blood loss from tracheal tear + PEG placement - Hgb holding steady around 8.0 - follow trend while on heparin   Thrombocytopenia  -resolved  ETOH abuse  -Should be well beyond the window for withdrawal - there was suspicion of surreptitious use while in hospital, but EtOH level and UDS unremarkable    Code Status: FULL Family Communication: spoke w/ wife at bedside at length  Disposition Plan: SDU  Consultants: None   Procedures: 10/24 TTE  Antibiotics: ceftaz 10/22 > 10/23 vanc 10/22 > 10/23  DVT prophylaxis: IV heparin   Objective: Blood pressure 136/81, pulse  104, temperature 98.6 F (37 C), temperature source Axillary, resp. rate 25, height 5\' 8"  (1.727 m), weight 76 kg (167 lb 8.8 oz), SpO2 97 %.  Intake/Output Summary (Last 24 hours) at 07/23/15 1439 Last data filed at 07/23/15 1300  Gross per 24 hour  Intake   1470 ml  Output   1450 ml  Net     20 ml   Exam: General: no appreciable resp distress - pt noncommunicative  Lungs: fine crackles diffusely persist - no wheeze  Cardiovascular: Tachycardic at 120 bpm but regular without appreciable murmur gallop or rub Abdomen: Nontender, nondistended, soft, bowel sounds positive, no rebound, no ascites, no appreciable mass - PEG insertion clean and dry  Extremities: No significant cyanosis, clubbing, edema bilateral lower extremities  Data Reviewed: Basic Metabolic Panel:  Recent Labs Lab 07/19/15 1718 07/19/15 2122 07/20/15 0359 07/20/15 1630 07/21/15 0314 07/22/15 0319 07/23/15 0328  NA 143  --  137 139 143 144  --   K 4.8  --  4.3 4.4 4.1 3.9  --   CL 112*  --  109 110 113* 111  --   CO2 21*  --  19* 20* 22 22  --   GLUCOSE 138*  --  130* 134* 120* 123*  --   BUN 22*  --  27* 34* 39* 45*  --   CREATININE 1.68*  --  1.70* 1.74* 1.82* 1.72*  --   CALCIUM 8.5*  --  7.8* 8.1* 8.4* 8.5*  --   MG  --  1.8  --  1.9 1.9 2.0 1.9  PHOS  --  3.0  --   --   --   --   --     CBC:  Recent Labs Lab 07/19/15 0624 07/19/15 1718 07/20/15 0359 07/20/15 1630 07/21/15 0314 07/22/15 0319  WBC 14.2* 12.8* 11.7* 10.7* 9.1 9.2  NEUTROABS 11.5* 11.5* 10.1* 9.8* 7.1  --   HGB 8.0* 9.6* 7.7* 8.3* 7.9* 8.3*  HCT 22.6* 27.8* 22.8* 23.5* 23.3* 24.7*  MCV 87.6 87.4 88.0 86.7 88.9 90.1  PLT 212 216 229 275 271 274    Liver Function Tests:  Recent Labs Lab 07/19/15 1718 07/20/15 0359 07/20/15 1630 07/21/15 0314 07/22/15 0319  AST 37 23 32 25 37  ALT 12* 11* 14* 13* 16*  ALKPHOS 72 56 58 56 65  BILITOT 1.7* 0.9 0.8 0.8 0.7  PROT 6.8 6.1* 6.9 6.0* 6.0*  ALBUMIN 2.1* 1.9* 2.0* 1.9* 2.0*      Coags:  Recent Labs Lab 07/17/15 0345  INR 1.31    Recent Labs Lab 07/17/15 0345  APTT 41*    Cardiac Enzymes:  Recent Labs Lab 07/20/15 1630 07/20/15 2016 07/21/15 0314  TROPONINI 0.05* 0.06* 0.04*    CBG:  Recent Labs Lab 07/22/15 1920 07/22/15 2344 07/23/15 0346 07/23/15 0733 07/23/15 1205  GLUCAP 161* 155* 125* 118* 186*    Recent Results (from the past 240 hour(s))  Culture, blood (routine x 2)     Status: None   Collection Time: 07/15/15  8:24 PM  Result Value Ref Range Status   Specimen Description BLOOD RIGHT ARM  Final   Special Requests IN PEDIATRIC BOTTLE 1.5CC  Final   Culture NO GROWTH 5 DAYS  Final   Report Status 07/20/2015 FINAL  Final  Culture, blood (routine x 2)     Status: None   Collection Time: 07/15/15  8:41 PM  Result Value Ref Range Status   Specimen Description BLOOD LEFT HAND  Final   Special Requests IN PEDIATRIC BOTTLE 2CC  Final   Culture NO GROWTH 5 DAYS  Final   Report Status 07/20/2015 FINAL  Final  Blood Culture (routine x 2)     Status: None (Preliminary result)   Collection Time: 07/19/15  5:18 PM  Result Value Ref Range Status   Specimen Description BLOOD LEFT HAND  Final   Special Requests BOTTLES DRAWN AEROBIC AND ANAEROBIC 5CC  Final   Culture NO GROWTH 4 DAYS  Final   Report Status PENDING  Incomplete  Blood Culture (routine x 2)     Status: None (Preliminary result)   Collection Time: 07/19/15  5:20 PM  Result Value Ref Range Status   Specimen Description BLOOD RIGHT ANTECUBITAL  Final   Special Requests BOTTLES DRAWN AEROBIC ONLY 5CC  Final   Culture NO GROWTH 4 DAYS  Final   Report Status PENDING  Incomplete  Urine culture     Status: None   Collection Time: 07/19/15  5:39 PM  Result Value Ref Range Status   Specimen Description URINE, CLEAN CATCH  Final   Special Requests NONE  Final   Culture 20,000 COLONIES/mL YEAST  Final   Report Status 07/21/2015 FINAL  Final  Culture, blood (routine x 2)  Call MD if unable to obtain prior to antibiotics being given     Status: None (Preliminary result)   Collection Time: 07/19/15  9:17 PM  Result Value Ref Range Status   Specimen Description BLOOD LEFT ARM  Final   Special Requests BOTTLES DRAWN AEROBIC AND ANAEROBIC 5CC  Final   Culture NO GROWTH 4 DAYS  Final   Report Status PENDING  Incomplete  Culture, blood (routine x 2) Call MD if unable to obtain prior to antibiotics being given     Status: None (Preliminary result)   Collection Time: 07/19/15  9:22 PM  Result Value Ref Range Status   Specimen Description BLOOD LEFT HAND  Final   Special Requests BOTTLES DRAWN AEROBIC AND ANAEROBIC 2.5CC  Final   Culture NO GROWTH 4 DAYS  Final   Report Status PENDING  Incomplete     Studies:   Recent x-ray studies have been reviewed in detail by the Attending Physician  Scheduled Meds:  Scheduled Meds: . feeding supplement (PRO-STAT SUGAR FREE 64)  30 mL Per Tube BID  . folic acid  1 mg Oral Daily  . [START ON 07/24/2015] furosemide  20 mg Oral Daily  . heparin  4,000 Units Intravenous Once  . ipratropium  0.5 mg Nebulization TID  . levalbuterol  1.25 mg Nebulization TID  . metoprolol tartrate  25 mg Per Tube BID  . multivitamin with minerals  1 tablet Oral Daily  . pantoprazole sodium  40 mg Per Tube BID  . predniSONE  40 mg Per Tube Q breakfast  . thiamine  100 mg Oral Daily    Time spent on care of this patient: 35 mins   Harnoor Reta T , MD   [facility] Office  [phone] Pager -  Text Page per Shea Evans as per below:  On-Call/Text Page:      [email or web address]      password [email or web address]  If 7PM-7AM, please contact night-coverage [email or web address] Password [email or web address] 07/23/2015, 2:39 PM   LOS: 4 days

## 2015-07-24 DIAGNOSIS — J9601 Acute respiratory failure with hypoxia: Secondary | ICD-10-CM | POA: Diagnosis present

## 2015-07-24 DIAGNOSIS — I82403 Acute embolism and thrombosis of unspecified deep veins of lower extremity, bilateral: Secondary | ICD-10-CM

## 2015-07-24 DIAGNOSIS — J810 Acute pulmonary edema: Secondary | ICD-10-CM | POA: Diagnosis present

## 2015-07-24 DIAGNOSIS — R410 Disorientation, unspecified: Secondary | ICD-10-CM | POA: Diagnosis present

## 2015-07-24 LAB — GLUCOSE, CAPILLARY
GLUCOSE-CAPILLARY: 130 mg/dL — AB (ref 65–99)
GLUCOSE-CAPILLARY: 149 mg/dL — AB (ref 65–99)
GLUCOSE-CAPILLARY: 160 mg/dL — AB (ref 65–99)
Glucose-Capillary: 120 mg/dL — ABNORMAL HIGH (ref 65–99)
Glucose-Capillary: 140 mg/dL — ABNORMAL HIGH (ref 65–99)

## 2015-07-24 LAB — CULTURE, BLOOD (ROUTINE X 2)
Culture: NO GROWTH
Culture: NO GROWTH
Culture: NO GROWTH
Culture: NO GROWTH

## 2015-07-24 LAB — COMPREHENSIVE METABOLIC PANEL
ALT: 28 U/L (ref 17–63)
AST: 54 U/L — AB (ref 15–41)
Albumin: 2.2 g/dL — ABNORMAL LOW (ref 3.5–5.0)
Alkaline Phosphatase: 88 U/L (ref 38–126)
Anion gap: 10 (ref 5–15)
BILIRUBIN TOTAL: 0.6 mg/dL (ref 0.3–1.2)
BUN: 44 mg/dL — AB (ref 6–20)
CALCIUM: 8.4 mg/dL — AB (ref 8.9–10.3)
CO2: 28 mmol/L (ref 22–32)
CREATININE: 1.38 mg/dL — AB (ref 0.61–1.24)
Chloride: 111 mmol/L (ref 101–111)
GFR calc Af Amer: >60 mL/min (ref >60)
GFR, EST NON AFRICAN AMERICAN: 52 mL/min — AB (ref >60)
Glucose, Bld: 124 mg/dL — ABNORMAL HIGH (ref 65–99)
Potassium: 3.7 mmol/L (ref 3.5–5.1)
Sodium: 149 mmol/L — ABNORMAL HIGH (ref 135–145)
TOTAL PROTEIN: 5.9 g/dL — AB (ref 6.5–8.1)

## 2015-07-24 LAB — CBC
HCT: 25.7 % — ABNORMAL LOW (ref 39.0–52.0)
Hemoglobin: 8.3 g/dL — ABNORMAL LOW (ref 13.0–17.0)
MCH: 29.9 pg (ref 26.0–34.0)
MCHC: 32.3 g/dL (ref 30.0–36.0)
MCV: 92.4 fL (ref 78.0–100.0)
PLATELETS: 314 10*3/uL (ref 150–400)
RBC: 2.78 MIL/uL — AB (ref 4.22–5.81)
RDW: 15.9 % — ABNORMAL HIGH (ref 11.5–15.5)
WBC: 9 10*3/uL (ref 4.0–10.5)

## 2015-07-24 LAB — HEPARIN LEVEL (UNFRACTIONATED)
Heparin Unfractionated: 0.52 IU/mL (ref 0.30–0.70)
Heparin Unfractionated: 0.3 IU/mL (ref 0.30–0.70)

## 2015-07-24 MED ORDER — RIVAROXABAN 20 MG PO TABS: 20.0000 mg | ORAL_TABLET | Freq: Every day | ORAL | Status: DC

## 2015-07-24 MED ORDER — RIVAROXABAN 15 MG PO TABS
15.0000 mg | ORAL_TABLET | Freq: Two times a day (BID) | ORAL | Status: DC
  Administered 2015-07-24 – 2015-07-27 (×6): 15 mg via ORAL
  Filled 2015-07-24 (×8): qty 1

## 2015-07-24 NOTE — Progress Notes (Addendum)
ANTICOAGULATION CONSULT NOTE - Initial Consult  Pharmacy Consult for Heparin Indication: PE  Allergies  Allergen Reactions  . Hydrochlorothiazide     REACTION: precipitates gout  . Pseudoephedrine     REACTION: rash    Patient Measurements: Height: 5\' 8"  (172.7 cm) Weight: 164 lb 7.4 oz (74.6 kg) IBW/kg (Calculated) : 68.4  Vital Signs: Temp: 98.4 F (36.9 C) (10/27 0303) Temp Source: Oral (10/27 0303) BP: 125/81 mmHg (10/27 0303) Pulse Rate: 99 (10/27 0303)  Labs:  Recent Labs  07/22/15 0319 07/23/15 2306 07/24/15 0247  HGB 8.3* 8.6* 8.3*  HCT 24.7* 26.4* 25.7*  PLT 274 327 314  HEPARINUNFRC  --  0.52 0.30  CREATININE 1.72*  --  1.38*    Estimated Creatinine Clearance: 51.6 mL/min (by C-G formula based on Cr of 1.38).   Medical History: Past Medical History  Diagnosis Date  . Hypertension   . Alcohol abuse   . Gout   . Hyperlipemia     Assessment: 64 yo M presents on 10/22 with fever and SOB. Was ready for discharge but now found to have possible PE. Pharmacy consulted to start heparin. D-dimer elevated at 15.94. Venous duplex positive for chronic DVT in R femoral vein and popliteal vein and left common femoral. Last HL therapeutic at 0.3 but borderline low  Goal of Therapy:  Heparin level 0.3-0.7 units/ml Monitor platelets by anticoagulation protocol: Yes   Plan:  Increase heparin gtt to 1,400 units/hr Check 6 hr HL Monitor daily HL, CBC, s/s of bleed  Elenor Quinones, PharmD Clinical Pharmacist Pager [phone] 07/24/2015 8:36 AM    ADDENDUM:  Pharmacy consulted to switch heparin to Xarelto for VTE treatment. Hgb low but stable at 8.3, plts wnl. SCr down to 1.38, CrCl ~42ml/min.  Plan: Start Xarelto 15mg  PO BID today at 1700 for 21 days Then transition to Xarelto 20mg  PO daily with supper on 11/17 Stop heparin gtt once first Xarelto dose given Monitor CBC, s/s of bleed

## 2015-07-24 NOTE — Progress Notes (Signed)
[place] TEAM 1 - Stepdown/ICU TEAM Progress Note  Mitchell [name] [id number] DOB: 20-Feb-1951 DOA: 07/19/2015 PCP: No primary care provider on file.  Admit HPI / Brief Narrative: 64 yo BM  PMHx HTN, HLD, Gout,  ETOH Abuse  Who was noted to have respiratory distress and odd resp sounds per wife 1 min after laying back down early am 10/11 and EMS was activated. Wife attempted cpr at home. He was down for approximately 10 minutes AND when EMS he was in V fib. Shocked x 1 and epi x1 with return of circulation. Intubated and transported to [facility] ED.    HPI/Subjective: 10/27  A/O 3 (does not know where,)..Negative N/V,negative CP.     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 -ETOH; negative  Acute Respiratory Failure Post secondary to flash pulmonary edema/aspiration? -Continue Nothing by mouth/hold tube feeds -Patient's respiratory status significantly improved, as well as his status.  Tracheal perforation (ill assume traumatic intubation) - S/P IR PEG placement -Hold all anticoagulants  CAP/Aspiration pneumonitis? -Completed 10 days of tx prior to discharge on 10/22 -CAP resolved, when compared to previous PCXR  Post V fib arrest  -S/p Hypothermia protocol   H/o DVT -cannot be anticoagulated secondary to tracheal tear  Chronic Systolic CHF -[id number] CVP= 8 mmHg -Transfuse for hemoglobin<8 -10/20 transfuse 1 unit PRBC -Lasix 20 mg daily -Continue metoprolol 25 mg  BID per tube -Strict in and out since admission - 1.9L -Daily a.m. Weight; 10/27 bed weight= 74.6 kg  Pulmonary edema -Resolved  HTN -See systolic CHF  Acute Renal Failure/ ATN post arrest -Cr trending down with diuretic use   Hypokalemia -Potassium goal>  4  Hypomagnesemia -Magnesium goal> 2  Nutrition -10/21 Passed MBS; however aspirated the next day, continue NPO -10/21 S/P PEG.; Continue tube feeds (Jevity; goal rate 60 ml/h)  Anemia  -Acute blood loss from tracheal tear and placement of PEG tube -Stable -See chronic CHF  Thrombocytopenia (ETOH, dilution) -Resolved   ETOH abuse  -Continue CIWA protocol use  Gout flare -Uric acid goal<6 -10/18 uric acid= 9.4 -Complete Prednisone 40 mg daily for 7 days    Bilateral lower extremity DVT (chronic)  -start Xarelto   Code Status: DO NOT RESUSCITATE Family Communication: None present at time of exam Disposition Plan:  SNF    Consultants: Dr.Wesam Kathryne Sharper [facility])   Procedure/Significant Events: 10/11 ct head - atrophy mild, neg acute 10/11 ct angio chest - There is extensive air in the neck and upper mediastinal region. Air is noted just anterior to the trachea in the lower neck region 10/11 EEG - markedly abnormal due to severe attenuation of background rhythms. No seizure activity. 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 -Minimal T2 changes w/i brainstem suggesting microvascular disease. 10/20 S/P IR GASTROSTOMY TUBE placed 10/20 transfuse 1 unit PRBC 10/26 bilateral Venous Duplex Lower Ext.  Positive for chronic appearing deep vein thrombosis in the right femoral vein and popliteal vein and the left common femoral and femoral veins.    Culture 10/11 MRSA by PCR negative 10/18  blood right arm/left hand NGTD   Antibiotics: Unasyn 10/11>>10/16 Rocephin 10/16>>> stopped 10/19 Cefazolin 10/20 one dose Fortaz 10/22>> stopped 10/23 Vancomycin 10/22>> stopped 10/23   DVT  prophylaxis: SCD   Devices   LINES / TUBES:  CVL ij 10/11>>>     Continuous Infusions: . feeding supplement (JEVITY 1.2 CAL) 1,000 mL (07/24/15 0600)    Objective: VITAL SIGNS: Temp: 98.3 F (36.8 C) (10/27 1931) Temp Source: Oral (10/27 1931) BP: 145/96 mmHg (10/27 1931) Pulse Rate: 109 (10/27 1931) SPO2; FIO2:   Intake/Output Summary (Last 24 hours) at 07/24/15 2009 Last data filed at 07/24/15 1937  Gross per 24 hour  Intake    876 ml  Output   1025 ml  Net   -149 ml     Exam: General:A/O 3 (does not know where,). negative acute respiratory distress Eyes: ,negative scleral hemorrhage ENT: Negative Runny nose, negative gingival bleeding, Neck:  Negative scars, masses, torticollis, lymphadenopathy, JVD, positive emphysema Rt>>Lt (improving) Lungs: clear to auscultation bilateral, negative wheezing left lung fields, negative crackles Cardiovascular: Regular rhythm and rate, without murmur gallop or rub normal S1 and S2 Abdomen:negative abdominal pain, nondistended, positive soft, bowel sounds, no rebound, no ascites, no appreciable mass, PEG tube in place negative sign of infection Extremities: bilateral wrists swollen warm to the touch, negative pain Psychiatric:  Negative depression, negative anxiety, negative fatigue, negative mania  Neurologic:  Cranial nerves II through XII intact, tongue/uvula midline, RUE/RLE strength 5/5 (fine motor function decrease), Left hemiparesis, sensation intact throughout, positive mild dysarthria, negative expressive aphasia, negative receptive aphasia.   Data Reviewed: Basic Metabolic Panel:  Recent Labs Lab 07/19/15 2122 07/20/15 0359 07/20/15 1630 07/21/15 0314 07/22/15 0319 07/23/15 0328 07/24/15 0247  NA  --  137 139 143 144  --  149*  K  --  4.3 4.4 4.1 3.9  --  3.7  CL  --  109 110 113* 111  --  111  CO2  --  19* 20* 22 22  --  28  GLUCOSE  --  130* 134* 120* 123*  --  124*  BUN  --  27* 34* 39* 45*  --  44*    CREATININE  --  1.70* 1.74* 1.82* 1.72*  --  1.38*  CALCIUM  --  7.8* 8.1* 8.4* 8.5*  --  8.4*  MG 1.8  --  1.9 1.9 2.0 1.9  --   PHOS 3.0  --   --   --   --   --   --    Liver Function Tests:  Recent Labs Lab 07/20/15 0359 07/20/15 1630 07/21/15 0314 07/22/15 0319 07/24/15 0247  AST 23 32 25 37 54*  ALT 11* 14* 13* 16* 28  ALKPHOS 56 58 56 65 88  BILITOT 0.9 0.8 0.8 0.7 0.6  PROT 6.1* 6.9 6.0* 6.0* 5.9*  ALBUMIN 1.9* 2.0* 1.9* 2.0* 2.2*   No results for input(s): LIPASE, AMYLASE in the last 168 hours. No results for input(s): AMMONIA in the last 168 hours. CBC:  Recent Labs Lab 07/19/15 0624 07/19/15 1718 07/20/15 0359 07/20/15 1630 07/21/15 0314 07/22/15 0319 07/23/15 2306 07/24/15 0247  WBC 14.2* 12.8* 11.7* 10.7* 9.1 9.2 9.3 9.0  NEUTROABS 11.5* 11.5* 10.1* 9.8* 7.1  --   --   --   HGB 8.0* 9.6* 7.7* 8.3* 7.9* 8.3* 8.6* 8.3*  HCT 22.6* 27.8* 22.8* 23.5* 23.3* 24.7* 26.4* 25.7*  MCV 87.6 87.4 88.0 86.7 88.9 90.1 90.7 92.4  PLT 212 216 229  275 271 274 327 314   Cardiac Enzymes:  Recent Labs Lab 07/20/15 1630 07/20/15 2016 07/21/15 0314  TROPONINI 0.05* 0.06* 0.04*   BNP (last 3 results)  Recent Labs  07/19/15 1718  BNP 3064.0*    ProBNP (last 3 results) No results for input(s): PROBNP in the last 8760 hours.  CBG:  Recent Labs Lab 07/23/15 2314 07/24/15 0305 07/24/15 0727 07/24/15 1155 07/24/15 1636  GLUCAP 137* 130* 120* 140* 149*    Recent Results (from the past 240 hour(s))  Culture, blood (routine x 2)     Status: None   Collection Time: 07/15/15  8:24 PM  Result Value Ref Range Status   Specimen Description BLOOD RIGHT ARM  Final   Special Requests IN PEDIATRIC BOTTLE 1.5CC  Final   Culture NO GROWTH 5 DAYS  Final   Report Status 07/20/2015 FINAL  Final  Culture, blood (routine x 2)     Status: None   Collection Time: 07/15/15  8:41 PM  Result Value Ref Range Status   Specimen Description BLOOD LEFT HAND  Final   Special  Requests IN PEDIATRIC BOTTLE 2CC  Final   Culture NO GROWTH 5 DAYS  Final   Report Status 07/20/2015 FINAL  Final  Blood Culture (routine x 2)     Status: None   Collection Time: 07/19/15  5:18 PM  Result Value Ref Range Status   Specimen Description BLOOD LEFT HAND  Final   Special Requests BOTTLES DRAWN AEROBIC AND ANAEROBIC 5CC  Final   Culture NO GROWTH 5 DAYS  Final   Report Status 07/24/2015 FINAL  Final  Blood Culture (routine x 2)     Status: None   Collection Time: 07/19/15  5:20 PM  Result Value Ref Range Status   Specimen Description BLOOD RIGHT ANTECUBITAL  Final   Special Requests BOTTLES DRAWN AEROBIC ONLY 5CC  Final   Culture NO GROWTH 5 DAYS  Final   Report Status 07/24/2015 FINAL  Final  Urine culture     Status: None   Collection Time: 07/19/15  5:39 PM  Result Value Ref Range Status   Specimen Description URINE, CLEAN CATCH  Final   Special Requests NONE  Final   Culture 20,000 COLONIES/mL YEAST  Final   Report Status 07/21/2015 FINAL  Final  Culture, blood (routine x 2) Call MD if unable to obtain prior to antibiotics being given     Status: None   Collection Time: 07/19/15  9:17 PM  Result Value Ref Range Status   Specimen Description BLOOD LEFT ARM  Final   Special Requests BOTTLES DRAWN AEROBIC AND ANAEROBIC 5CC  Final   Culture NO GROWTH 5 DAYS  Final   Report Status 07/24/2015 FINAL  Final  Culture, blood (routine x 2) Call MD if unable to obtain prior to antibiotics being given     Status: None   Collection Time: 07/19/15  9:22 PM  Result Value Ref Range Status   Specimen Description BLOOD LEFT HAND  Final   Special Requests BOTTLES DRAWN AEROBIC AND ANAEROBIC 2.5CC  Final   Culture NO GROWTH 5 DAYS  Final   Report Status 07/24/2015 FINAL  Final     Studies:  Recent x-ray studies have been reviewed in detail by the Attending Physician  Scheduled Meds:  Scheduled Meds: . antiseptic oral rinse  7 mL Mouth Rinse q12n4p  . chlorhexidine  15 mL  Mouth Rinse BID  . feeding supplement (PRO-STAT SUGAR FREE 64)  30  mL Per Tube BID  . folic acid  1 mg Per Tube Daily  . furosemide  20 mg Per Tube Daily  . ipratropium  0.5 mg Nebulization TID  . levalbuterol  1.25 mg Nebulization TID  . metoprolol tartrate  25 mg Per Tube BID  . multivitamin with minerals  1 tablet Per Tube Daily  . pantoprazole sodium  40 mg Per Tube BID  . predniSONE  20 mg Per Tube Q breakfast  . Rivaroxaban  15 mg Oral BID WC  . [START ON 08/14/2015] rivaroxaban  20 mg Oral Q supper  . thiamine  100 mg Per Tube Daily    Time spent on care of this patient: 40 mins   WOODS, Geraldo Docker , MD  [facility] Office  [phone] Pager - [phone]  On-Call/Text Page:      [email or web address]      password [email or web address]  If 7PM-7AM, please contact night-coverage [email or web address] Password [email or web address] 07/24/2015, 8:09 PM   LOS: 5 days   Care during the described time interval was provided by me .  I have reviewed this patient's available data, including medical history, events of note, physical examination, and all test results as part of my evaluation. I have personally reviewed and interpreted all radiology studies.   Dia Crawford, MD [phone] Pager

## 2015-07-24 NOTE — Progress Notes (Signed)
ANTICOAGULATION CONSULT NOTE - Follow Up Consult  Pharmacy Consult for heparin Indication: pulmonary embolus   Labs:  Recent Labs  07/21/15 0314 07/22/15 0319 07/23/15 2306  HGB 7.9* 8.3* 8.6*  HCT 23.3* 24.7* 26.4*  PLT 271 274 327  HEPARINUNFRC  --   --  0.52  CREATININE 1.82* 1.72*  --   TROPONINI 0.04*  --   --      Assessment/Plan:  64yo male therapeutic on heparin with initial dosing for PE. Will continue gtt at current rate and confirm stable with am labs.   Wynona Neat, PharmD, BCPS  07/24/2015,12:07 AM

## 2015-07-24 NOTE — Progress Notes (Signed)
Pt is pulling at peg tube,  Says " it's not mine" placed safety mittens on patient will continue to monitor.

## 2015-07-25 ENCOUNTER — Inpatient Hospital Stay (HOSPITAL_COMMUNITY): Payer: Medicare Other

## 2015-07-25 LAB — GLUCOSE, CAPILLARY
GLUCOSE-CAPILLARY: 111 mg/dL — AB (ref 65–99)
GLUCOSE-CAPILLARY: 124 mg/dL — AB (ref 65–99)
GLUCOSE-CAPILLARY: 126 mg/dL — AB (ref 65–99)
Glucose-Capillary: 112 mg/dL — ABNORMAL HIGH (ref 65–99)
Glucose-Capillary: 142 mg/dL — ABNORMAL HIGH (ref 65–99)
Glucose-Capillary: 145 mg/dL — ABNORMAL HIGH (ref 65–99)

## 2015-07-25 LAB — CBC
HCT: 31.3 % — ABNORMAL LOW (ref 39.0–52.0)
HEMOGLOBIN: 10.1 g/dL — AB (ref 13.0–17.0)
MCH: 29.7 pg (ref 26.0–34.0)
MCHC: 32.3 g/dL (ref 30.0–36.0)
MCV: 92.1 fL (ref 78.0–100.0)
Platelets: 429 10*3/uL — ABNORMAL HIGH (ref 150–400)
RBC: 3.4 MIL/uL — ABNORMAL LOW (ref 4.22–5.81)
RDW: 16.5 % — ABNORMAL HIGH (ref 11.5–15.5)
WBC: 16.1 10*3/uL — AB (ref 4.0–10.5)

## 2015-07-25 MED ORDER — LORAZEPAM 2 MG/ML IJ SOLN
0.5000 mg | Freq: Once | INTRAMUSCULAR | Status: AC
  Administered 2015-07-25: 0.5 mg via INTRAVENOUS

## 2015-07-25 MED ORDER — HALOPERIDOL LACTATE 5 MG/ML IJ SOLN
1.0000 mg | Freq: Four times a day (QID) | INTRAMUSCULAR | Status: DC | PRN
  Administered 2015-07-25: 1 mg via INTRAVENOUS
  Filled 2015-07-25: qty 1

## 2015-07-25 MED ORDER — LORAZEPAM 2 MG/ML IJ SOLN
INTRAMUSCULAR | Status: AC
  Filled 2015-07-25: qty 1

## 2015-07-25 MED ORDER — FUROSEMIDE 10 MG/ML IJ SOLN
INTRAMUSCULAR | Status: AC
  Administered 2015-07-25: 20 mg
  Filled 2015-07-25: qty 2

## 2015-07-25 NOTE — Progress Notes (Signed)
   07/25/15 0531  Output (mL)  Urine 800 mL  since 20 of lasix @ 0300

## 2015-07-25 NOTE — Progress Notes (Signed)
Nutrition Follow-up  DOCUMENTATION CODES:   Not applicable  INTERVENTION:    Recommend add 200 ml free water flushes QID for total free water intake of 1966 ml per day.  Continue Jevity 1.2 via PEG at 60 ml/h (1440 ml/day) with Prostat 30 ml BID to provide 1928 kcals, 110 gm protein, 1166 ml free water daily  NUTRITION DIAGNOSIS:   Inadequate oral intake related to dysphagia as evidenced by NPO status.  Ongoing  GOAL:   Patient will meet greater than or equal to 90% of their needs  Met  MONITOR:   TF tolerance, Weight trends, Labs  REASON FOR ASSESSMENT:   Consult Enteral/tube feeding initiation and management  ASSESSMENT:   64 y.o. male with a past medical history of hypertension, alcohol abuse, gout, hyperlipidemia, status post cardiac arrest with ventricular fibrillation (down for about 10 minutes on the field), status post CVA, status post community-acquired pneumonia who was discharged from the hospital earlier to [facility] rehabilitation facility, but returned to the hospital via the emergency department due to a 38C fever when he arrived to the skilled nursing facility.   Labs reviewed: sodium, BUN, creatinine elevated 10/27. Currently not receiving any IVF. Is receiving Lasix daily.  Per discussion with RN, patient is tolerating TF well. Patient is currently receiving Jevity 1.2 via PEG at 60 ml/h (1440 ml/day) with Prostat 30 ml BID to provide 1928 kcals, 110 gm protein, 1166 ml free water daily. TF is meeting 100% of estimated nutrition needs. Remains NPO at this time.   Diet Order:  Diet NPO time specified  Skin:  Reviewed, no issues  Last BM:  unknown  Height:   Ht Readings from Last 1 Encounters:  07/25/15 '5\' 8"'  (1.727 m)    Weight:   Wt Readings from Last 1 Encounters:  07/25/15 160 lb 11.2 oz (72.893 kg)    Ideal Body Weight:  70 kg  BMI:  Body mass index is 24.44 kg/(m^2).  Estimated Nutritional Needs:   Kcal:  1800-2000  Protein:   100-120 gm  Fluid:  > 1.8 L  EDUCATION NEEDS:   No education needs identified at this time  Molli Barrows, [place], [place], [place] Pager [phone] After Hours Pager [phone]

## 2015-07-25 NOTE — Progress Notes (Addendum)
Pt lungs sounds are audibly wet, respirations as high as the 40's oxygen saturations down to 82 %on room air [place] placed  @ 3 liters  paged K schorr who ordered 20 of lasix will continue to monitor

## 2015-07-25 NOTE — Discharge Instructions (Signed)
Information on my medicine - XARELTO (rivaroxaban)  This medication education was reviewed with me or my healthcare representative as part of my discharge preparation.  The pharmacist that spoke with me during my hospital stay was:  Reginia Naas, [place]? Xarelto was prescribed to treat blood clots that may have been found in the veins of your legs (deep vein thrombosis) or in your lungs (pulmonary embolism) and to reduce the risk of them occurring again.  What do you need to know about Xarelto? The starting dose is one 15 mg tablet taken TWICE daily with food for the FIRST 21 DAYS then on 11/17 the dose is changed to one 20 mg tablet taken ONCE A DAY with your evening meal.  DO NOT stop taking Xarelto without talking to the health care provider who prescribed the medication.  Refill your prescription for 20 mg tablets before you run out.  After discharge, you should have regular check-up appointments with your healthcare provider that is prescribing your Xarelto.  In the future your dose may need to be changed if your kidney function changes by a significant amount.  What do you do if you miss a dose? If you are taking Xarelto TWICE DAILY and you miss a dose, take it as soon as you remember. You may take two 15 mg tablets (total 30 mg) at the same time then resume your regularly scheduled 15 mg twice daily the next day.  If you are taking Xarelto ONCE DAILY and you miss a dose, take it as soon as you remember on the same day then continue your regularly scheduled once daily regimen the next day. Do not take two doses of Xarelto at the same time.   Important Safety Information Xarelto is a blood thinner medicine that can cause bleeding. You should call your healthcare provider right away if you experience any of the following: ? Bleeding from an injury or your nose that does not stop. ? Unusual colored urine (red or dark brown) or unusual colored  stools (red or black). ? Unusual bruising for unknown reasons. ? A serious fall or if you hit your head (even if there is no bleeding).  Some medicines may interact with Xarelto and might increase your risk of bleeding while on Xarelto. To help avoid this, consult your healthcare provider or pharmacist prior to using any new prescription or non-prescription medications, including herbals, vitamins, non-steroidal anti-inflammatory drugs (NSAIDs) and supplements.  This website has more information on Xarelto: [email or web address]

## 2015-07-25 NOTE — Care Management Important Message (Signed)
Important Message  Patient Details  Name: Mitchell [name] MRN: [id number] Date of Birth: 07-14-1951   Medicare Important Message Given:  Yes-third notification given    Nathen May 07/25/2015, 11:56 AM

## 2015-07-25 NOTE — Progress Notes (Signed)
Pt continue to pull at peg pt denies being short of breath but respirations 30's-40's oxygen saturations 98% on 3 liters nasal cannula K Schorr notified abdominal binder ordered and 0.5 ativan will continue to monitor

## 2015-07-25 NOTE — Progress Notes (Signed)
[name] TEAM 1 - Stepdown/ICU TEAM PROGRESS NOTE  Fineas Tweet [id number] DOB: [birth year] DOA: 07/19/2015 PCP: No primary care provider on file.  Admit HPI / Brief Narrative: 64 yo MHx HTN, HLD, Gout,and ETOH abuse who was noted to have respiratory distress and odd resp sounds per wife 1 min after laying downearly am 10/11. EMS was activated. Wife attempted cpr at home. He was down for approximately 10 minutes when EMS arrived and noted he was in V fib. Shocked x 1 and epi x1 with return of circulation. Intubated and transported to [facility] ED. He was admitted, underwent a full workup, and was ultimately d/c to a SNF for rehab on 10/22.  Said SNF returned the pt to the hospital the evening of the same day w/ a reported "fever" of 38C.    HPI/Subjective: Last night/early this morning patient became tachypneic again with respiratory rates in the 30s to 40s.  He was pulling at his PEG tube and required upward titration of his oxygen.  His white blood cell count increased significantly today to 16.1 from 9.0 previously.  A follow-up chest x-ray today does not reveal any significant new findings.  The patient is more alert at the time of visit today than I have ever seen him.  He is conversant/interactive but confused.  He believes he is at a church.  He believes he is washed his car today and is doing his laundry.  He does not appear to be in any physical distress at the present time.  Assessment/Plan:   Acute Hypoxic Respiratory Failure - flash pulmonary edema +/- aspiration pneumonitis  -Had been weaned to room air with sats 97-99 percent - CXR noted "alveolar edema pattern" c/w simple pulmonary edema v/s aspiration pneumonitis - no temp >100.4 since admit - WBC quickly normalized but today rapidly increased to 16 - I suspect this represents flash pulmonary edema with a possible contribution from aspiration pneumonitis from aspiration of secretions - continue to attempt to gently diurese as renal  function will allow - keep nothing by mouth for now - follow-up chest x-ray today without new findings  Sinus tachycardia -In a patient with a history of DVT and a recent prolonged hospitalization during which pharmacologic DVT prophylaxis could not be administered this raises concern for the possibility of pulmonary embolism - a d-dimer was markedly elevated - CT angiogram cannot be accomplished given his renal insufficiency - lower extremity venous duplex noted chronic appearing extensive DVT in bilateral lower extremities - his wife confirms to me that he was supposed to be taking Xarelto prior to his most recent admission - this has been resumed and heparin discontinued  Tracheal perforation (assumed traumatic intubation) -blood from NG tube when removed during last hospital stay -no evidence of ongoing bleeding at this time   Post V fib arrest  -S/p Hypothermia protocol last admit - anoxic brain injury    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 -EEG w/o seizure activity last admit  -Brain MRI noted acute/subacute CVA during last admit  -Patient is now more alert but exhibiting confusion/intermittent agitation  H/o DVT - chronic B LE DVT -venous duplex this admit confirms chronic appearing DVT th/o B LE   Chronic Severe Systolic and diastolic CHF -EF 0000000 via TTE 07/11/2015 w/ diffuse hypokinesis - f/u TTE 10/24 notes improvement in EF to 30-35% and grade 2 DD - wgt at time of d/c 76kg - presently 73kg - gently diurese as renal function allows  HTN -BP currently controlled   Acute Renal Failure/ ATN post arrest -crt continues to improve - follow trend   Nutrition -NPO for now - was cleared by SLP for D3 (mech soft) thin liquid diet prior to d/c on 10/21 but then promptly aspirated -PEG in IR 10/21  Anemia  -Acute blood loss from tracheal tear + PEG placement - Hgb holding steady/improving   Thrombocytopenia  -resolved   ETOH abuse  -Should  be well beyond the window for withdrawal - there was suspicion of surreptitious use while in hospital but EtOH level and UDS unremarkable    Code Status: FULL Family Communication: spoke w/ wife at bedside at length  Disposition Plan: SDU - ?return to [facility] 10/29  Consultants: None   Procedures: 10/24 TTE  Antibiotics: ceftaz 10/22 > 10/23 vanc 10/22 > 10/23  DVT prophylaxis: Xarelto   Objective: Blood pressure 125/74, pulse 91, temperature 98.1 F (36.7 C), temperature source Oral, resp. rate 26, height 5\' 8"  (1.727 m), weight 72.893 kg (160 lb 11.2 oz), SpO2 96 %.  Intake/Output Summary (Last 24 hours) at 07/25/15 1508 Last data filed at 07/25/15 1200  Gross per 24 hour  Intake 2199.78 ml  Output   2325 ml  Net -125.22 ml   Exam: General: no appreciable resp distress - alert but confused and agitated  Lungs: fine crackles diffusely - no wheeze  Cardiovascular: RRR without murmur gallop or rub Abdomen: Nontender, nondistended, soft, bowel sounds positive, no rebound, no ascites, no appreciable mass - PEG insertion clean and dry  Extremities: No significant cyanosis, clubbing, or edema bilateral lower extremities  Data Reviewed: Basic Metabolic Panel:  Recent Labs Lab 07/19/15 2122 07/20/15 0359 07/20/15 1630 07/21/15 0314 07/22/15 0319 07/23/15 0328 07/24/15 0247  NA  --  137 139 143 144  --  149*  K  --  4.3 4.4 4.1 3.9  --  3.7  CL  --  109 110 113* 111  --  111  CO2  --  19* 20* 22 22  --  28  GLUCOSE  --  130* 134* 120* 123*  --  124*  BUN  --  27* 34* 39* 45*  --  44*  CREATININE  --  1.70* 1.74* 1.82* 1.72*  --  1.38*  CALCIUM  --  7.8* 8.1* 8.4* 8.5*  --  8.4*  MG 1.8  --  1.9 1.9 2.0 1.9  --   PHOS 3.0  --   --   --   --   --   --     CBC:  Recent Labs Lab 07/19/15 0624 07/19/15 1718 07/20/15 0359 07/20/15 1630 07/21/15 0314 07/22/15 0319 07/23/15 2306 07/24/15 0247 07/25/15 0252  WBC 14.2* 12.8* 11.7* 10.7* 9.1 9.2 9.3 9.0 16.1*    NEUTROABS 11.5* 11.5* 10.1* 9.8* 7.1  --   --   --   --   HGB 8.0* 9.6* 7.7* 8.3* 7.9* 8.3* 8.6* 8.3* 10.1*  HCT 22.6* 27.8* 22.8* 23.5* 23.3* 24.7* 26.4* 25.7* 31.3*  MCV 87.6 87.4 88.0 86.7 88.9 90.1 90.7 92.4 92.1  PLT 212 216 229 275 271 274 327 314 429*    Liver Function Tests:  Recent Labs Lab 07/20/15 0359 07/20/15 1630 07/21/15 0314 07/22/15 0319 07/24/15 0247  AST 23 32 25 37 54*  ALT 11* 14* 13* 16* 28  ALKPHOS 56 58 56 65 88  BILITOT 0.9 0.8 0.8 0.7 0.6  PROT 6.1* 6.9 6.0* 6.0* 5.9*  ALBUMIN 1.9* 2.0* 1.9*  2.0* 2.2*    Cardiac Enzymes:  Recent Labs Lab 07/20/15 1630 07/20/15 2016 07/21/15 0314  TROPONINI 0.05* 0.06* 0.04*    CBG:  Recent Labs Lab 07/24/15 1932 07/24/15 2352 07/25/15 0308 07/25/15 0844 07/25/15 1203  GLUCAP 160* 124* 111* 112* 142*    Recent Results (from the past 240 hour(s))  Culture, blood (routine x 2)     Status: None   Collection Time: 07/15/15  8:24 PM  Result Value Ref Range Status   Specimen Description BLOOD RIGHT ARM  Final   Special Requests IN PEDIATRIC BOTTLE 1.5CC  Final   Culture NO GROWTH 5 DAYS  Final   Report Status 07/20/2015 FINAL  Final  Culture, blood (routine x 2)     Status: None   Collection Time: 07/15/15  8:41 PM  Result Value Ref Range Status   Specimen Description BLOOD LEFT HAND  Final   Special Requests IN PEDIATRIC BOTTLE 2CC  Final   Culture NO GROWTH 5 DAYS  Final   Report Status 07/20/2015 FINAL  Final  Blood Culture (routine x 2)     Status: None   Collection Time: 07/19/15  5:18 PM  Result Value Ref Range Status   Specimen Description BLOOD LEFT HAND  Final   Special Requests BOTTLES DRAWN AEROBIC AND ANAEROBIC 5CC  Final   Culture NO GROWTH 5 DAYS  Final   Report Status 07/24/2015 FINAL  Final  Blood Culture (routine x 2)     Status: None   Collection Time: 07/19/15  5:20 PM  Result Value Ref Range Status   Specimen Description BLOOD RIGHT ANTECUBITAL  Final   Special Requests  BOTTLES DRAWN AEROBIC ONLY 5CC  Final   Culture NO GROWTH 5 DAYS  Final   Report Status 07/24/2015 FINAL  Final  Urine culture     Status: None   Collection Time: 07/19/15  5:39 PM  Result Value Ref Range Status   Specimen Description URINE, CLEAN CATCH  Final   Special Requests NONE  Final   Culture 20,000 COLONIES/mL YEAST  Final   Report Status 07/21/2015 FINAL  Final  Culture, blood (routine x 2) Call MD if unable to obtain prior to antibiotics being given     Status: None   Collection Time: 07/19/15  9:17 PM  Result Value Ref Range Status   Specimen Description BLOOD LEFT ARM  Final   Special Requests BOTTLES DRAWN AEROBIC AND ANAEROBIC 5CC  Final   Culture NO GROWTH 5 DAYS  Final   Report Status 07/24/2015 FINAL  Final  Culture, blood (routine x 2) Call MD if unable to obtain prior to antibiotics being given     Status: None   Collection Time: 07/19/15  9:22 PM  Result Value Ref Range Status   Specimen Description BLOOD LEFT HAND  Final   Special Requests BOTTLES DRAWN AEROBIC AND ANAEROBIC 2.5CC  Final   Culture NO GROWTH 5 DAYS  Final   Report Status 07/24/2015 FINAL  Final     Studies:   Recent x-ray studies have been reviewed in detail by the Attending Physician  Scheduled Meds:  Scheduled Meds: . antiseptic oral rinse  7 mL Mouth Rinse q12n4p  . chlorhexidine  15 mL Mouth Rinse BID  . feeding supplement (PRO-STAT SUGAR FREE 64)  30 mL Per Tube BID  . folic acid  1 mg Per Tube Daily  . furosemide  20 mg Per Tube Daily  . ipratropium  0.5 mg Nebulization TID  .  levalbuterol  1.25 mg Nebulization TID  . metoprolol tartrate  25 mg Per Tube BID  . multivitamin with minerals  1 tablet Per Tube Daily  . pantoprazole sodium  40 mg Per Tube BID  . predniSONE  20 mg Per Tube Q breakfast  . Rivaroxaban  15 mg Oral BID WC  . [START ON 08/14/2015] rivaroxaban  20 mg Oral Q supper  . thiamine  100 mg Per Tube Daily    Time spent on care of this patient: 35  mins   Lindel Marcell T , MD   [facility] Office  [phone] Pager - Text Page per Shea Evans as per below:  On-Call/Text Page:      [email or web address]      password [email or web address]  If 7PM-7AM, please contact night-coverage [email or web address] Password [email or web address] 07/25/2015, 3:08 PM   LOS: 6 days

## 2015-07-25 NOTE — Clinical Social Work Note (Signed)
CSW continuing to follow and will assist with discharge back to [facility] when medically stable.   Aronda Burford Givens, MSW, LCSW Licensed Clinical Social Worker [place] [phone]

## 2015-07-26 ENCOUNTER — Inpatient Hospital Stay (HOSPITAL_COMMUNITY): Payer: Medicare Other

## 2015-07-26 LAB — COMPREHENSIVE METABOLIC PANEL
ALBUMIN: 2.1 g/dL — AB (ref 3.5–5.0)
ALT: 35 U/L (ref 17–63)
AST: 39 U/L (ref 15–41)
Alkaline Phosphatase: 85 U/L (ref 38–126)
Anion gap: 7 (ref 5–15)
BILIRUBIN TOTAL: 0.6 mg/dL (ref 0.3–1.2)
BUN: 41 mg/dL — AB (ref 6–20)
CHLORIDE: 112 mmol/L — AB (ref 101–111)
CO2: 28 mmol/L (ref 22–32)
CREATININE: 1.61 mg/dL — AB (ref 0.61–1.24)
Calcium: 8.3 mg/dL — ABNORMAL LOW (ref 8.9–10.3)
GFR calc Af Amer: 50 mL/min — ABNORMAL LOW (ref >60)
GFR calc non Af Amer: 43 mL/min — ABNORMAL LOW (ref >60)
GLUCOSE: 134 mg/dL — AB (ref 65–99)
POTASSIUM: 3.6 mmol/L (ref 3.5–5.1)
Sodium: 147 mmol/L — ABNORMAL HIGH (ref 135–145)
TOTAL PROTEIN: 5.8 g/dL — AB (ref 6.5–8.1)

## 2015-07-26 LAB — GLUCOSE, CAPILLARY
GLUCOSE-CAPILLARY: 135 mg/dL — AB (ref 65–99)
GLUCOSE-CAPILLARY: 149 mg/dL — AB (ref 65–99)
GLUCOSE-CAPILLARY: 144 mg/dL — AB (ref 65–99)
Glucose-Capillary: 120 mg/dL — ABNORMAL HIGH (ref 65–99)
Glucose-Capillary: 133 mg/dL — ABNORMAL HIGH (ref 65–99)
Glucose-Capillary: 107 mg/dL — ABNORMAL HIGH (ref 65–99)

## 2015-07-26 LAB — CBC
HEMATOCRIT: 25.6 % — AB (ref 39.0–52.0)
HEMOGLOBIN: 8.4 g/dL — AB (ref 13.0–17.0)
MCH: 30.8 pg (ref 26.0–34.0)
MCHC: 32.8 g/dL (ref 30.0–36.0)
MCV: 93.8 fL (ref 78.0–100.0)
Platelets: 298 10*3/uL (ref 150–400)
RBC: 2.73 MIL/uL — AB (ref 4.22–5.81)
RDW: 16.7 % — AB (ref 11.5–15.5)
WBC: 9.9 10*3/uL (ref 4.0–10.5)

## 2015-07-26 MED ORDER — LEVALBUTEROL HCL 1.25 MG/0.5ML IN NEBU
1.2500 mg | INHALATION_SOLUTION | Freq: Four times a day (QID) | RESPIRATORY_TRACT | Status: DC | PRN
  Filled 2015-07-26: qty 0.5

## 2015-07-26 NOTE — Progress Notes (Signed)
Pt taking small amounts of ice chips without coughing or any observable difficulty.  Pt able to spoon ice chips into his mouth without help.

## 2015-07-26 NOTE — Progress Notes (Signed)
[facility] TEAM 1 - Stepdown/ICU TEAM PROGRESS NOTE  Mitchell [name] [id number] DOB: Apr 17, 1951 DOA: 07/19/2015 PCP: No primary care provider on file.  Admit HPI / Brief Narrative: 64 yo MHx HTN, HLD, Gout,and ETOH abuse who was noted to have respiratory distress and odd resp sounds per wife 1 min after laying downearly am 10/11. EMS was activated. Wife attempted cpr at home. He was down for approximately 10 minutes when EMS arrived and noted he was in V fib. Shocked x 1 and epi x1 with return of circulation. Intubated and transported to [facility] ED. He was admitted, underwent a full workup, and was ultimately d/c to a SNF for rehab on 10/22.  Said SNF returned the pt to the hospital the evening of the same day w/ a reported "fever" of 38C.    HPI/Subjective: The patient is dramatically better today.  He is alert oriented and conversant.  He is jovial and quite pleasant.  He denies any complaints at this time.  He appears to be resting comfortably.  Assessment/Plan:   Acute Hypoxic Respiratory Failure - flash pulmonary edema +/- aspiration pneumonitis  -Had been weaned to room air with sats 97-99 percent - CXR noted "alveolar edema pattern" c/w simple pulmonary edema v/s aspiration pneumonitis - no temp >100.4 since admit - WBC quickly normalized but today rapidly increased to 16, then normalized the following day - I suspect this represents flash pulmonary edema with a possible contribution from aspiration pneumonitis from aspiration of secretions - follow-up chest x-ray today without new findings - saturation 98% on room air today  Sinus tachycardia -In a patient with a history of DVT and a recent prolonged hospitalization during which pharmacologic DVT prophylaxis could not be administered this raises concern for the possibility of pulmonary embolism - a d-dimer was markedly elevated - CT angiogram cannot be accomplished given his renal insufficiency - lower extremity venous duplex noted chronic  appearing extensive DVT in bilateral lower extremities - his wife confirms to me that he was supposed to be taking Xarelto prior to his most recent admission - this has been resumed  Tracheal perforation (assumed traumatic intubation) -blood from NG tube when removed during last hospital stay -no evidence of ongoing bleeding at this time - clinically this appears to have resolved  Post V fib arrest  -S/p Hypothermia protocol last admit - anoxic brain injury    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 -EEG w/o seizure activity last admit  -Brain MRI noted acute/subacute CVA during last admit  -Patient has made a dramatic improvement over the last 24 hours and is presently more alert and interactive than I have ever seen him  H/o DVT - chronic B LE DVT -venous duplex this admit confirms chronic appearing DVT th/o B LE - continue Xarelto  Chronic Severe Systolic and diastolic CHF -EF 0000000 via TTE 07/11/2015 w/ diffuse hypokinesis - f/u TTE 10/24 notes improvement in EF to 30-35% and grade 2 DD - wgt at time of d/c 76kg - presently 72.5kg - hold diuresis for now as creatinine is climbing  HTN -BP currently controlled   Acute Renal Failure/ ATN post arrest -Creatinine bumped back up a bit today - hold further diuresis and follow  Nutrition -cleared by SLP for D3 (mech soft) thin liquid diet prior to d/c on 10/21 but then promptly aspirated -PEG in IR 10/21 -Begin trial of clear liquids today - patient instructed to start with ice chips and then work his way  up to water only for now  Anemia  -Acute blood loss from tracheal tear + PEG placement - Hgb holding steady/improving   Thrombocytopenia  -resolved   ETOH abuse  -Should be well beyond the window for withdrawal - there was suspicion of surreptitious use while in hospital but EtOH level and UDS unremarkable    Code Status: FULL Family Communication: spoke w/ wife at bedside at length  Disposition  Plan: return to [facility] 10/30  Consultants: None   Procedures: 10/24 TTE  Antibiotics: ceftaz 10/22 > 10/23 vanc 10/22 > 10/23  DVT prophylaxis: Xarelto   Objective: Blood pressure 132/78, pulse 98, temperature 99.3 F (37.4 C), temperature source Oral, resp. rate 27, height 5\' 8"  (1.727 m), weight 72.5 kg (159 lb 13.3 oz), SpO2 98 %.  Intake/Output Summary (Last 24 hours) at 07/26/15 1600 Last data filed at 07/26/15 1503  Gross per 24 hour  Intake    780 ml  Output   1575 ml  Net   -795 ml   Exam: General: no appreciable resp distress - alert and oriented Lungs: Clear throughout all fields with no wheeze Cardiovascular: RRR without murmur gallop rub Abdomen: Nontender, nondistended, soft, bowel sounds positive, no rebound, no ascites, no appreciable mass Extremities: No significant cyanosis, clubbing, edema bilateral lower extremities  Data Reviewed: Basic Metabolic Panel:  Recent Labs Lab 07/19/15 2122  07/20/15 1630 07/21/15 0314 07/22/15 0319 07/23/15 0328 07/24/15 0247 07/26/15 0444  NA  --   < > 139 143 144  --  149* 147*  K  --   < > 4.4 4.1 3.9  --  3.7 3.6  CL  --   < > 110 113* 111  --  111 112*  CO2  --   < > 20* 22 22  --  28 28  GLUCOSE  --   < > 134* 120* 123*  --  124* 134*  BUN  --   < > 34* 39* 45*  --  44* 41*  CREATININE  --   < > 1.74* 1.82* 1.72*  --  1.38* 1.61*  CALCIUM  --   < > 8.1* 8.4* 8.5*  --  8.4* 8.3*  MG 1.8  --  1.9 1.9 2.0 1.9  --   --   PHOS 3.0  --   --   --   --   --   --   --   < > = values in this interval not displayed.  CBC:  Recent Labs Lab 07/19/15 1718 07/20/15 0359 07/20/15 1630 07/21/15 0314 07/22/15 0319 07/23/15 2306 07/24/15 0247 07/25/15 0252 07/26/15 0444  WBC 12.8* 11.7* 10.7* 9.1 9.2 9.3 9.0 16.1* 9.9  NEUTROABS 11.5* 10.1* 9.8* 7.1  --   --   --   --   --   HGB 9.6* 7.7* 8.3* 7.9* 8.3* 8.6* 8.3* 10.1* 8.4*  HCT 27.8* 22.8* 23.5* 23.3* 24.7* 26.4* 25.7* 31.3* 25.6*  MCV 87.4 88.0 86.7 88.9 90.1  90.7 92.4 92.1 93.8  PLT 216 229 275 271 274 327 314 429* 298    Liver Function Tests:  Recent Labs Lab 07/20/15 1630 07/21/15 0314 07/22/15 0319 07/24/15 0247 07/26/15 0444  AST 32 25 37 54* 39  ALT 14* 13* 16* 28 35  ALKPHOS 58 56 65 88 85  BILITOT 0.8 0.8 0.7 0.6 0.6  PROT 6.9 6.0* 6.0* 5.9* 5.8*  ALBUMIN 2.0* 1.9* 2.0* 2.2* 2.1*    Cardiac Enzymes:  Recent Labs Lab 07/20/15 1630  07/20/15 2016 07/21/15 0314  TROPONINI 0.05* 0.06* 0.04*    CBG:  Recent Labs Lab 07/25/15 2314 07/26/15 0310 07/26/15 0746 07/26/15 1113 07/26/15 1455  GLUCAP 120* 133* 135* 149* 144*    Recent Results (from the past 240 hour(s))  Blood Culture (routine x 2)     Status: None   Collection Time: 07/19/15  5:18 PM  Result Value Ref Range Status   Specimen Description BLOOD LEFT HAND  Final   Special Requests BOTTLES DRAWN AEROBIC AND ANAEROBIC 5CC  Final   Culture NO GROWTH 5 DAYS  Final   Report Status 07/24/2015 FINAL  Final  Blood Culture (routine x 2)     Status: None   Collection Time: 07/19/15  5:20 PM  Result Value Ref Range Status   Specimen Description BLOOD RIGHT ANTECUBITAL  Final   Special Requests BOTTLES DRAWN AEROBIC ONLY 5CC  Final   Culture NO GROWTH 5 DAYS  Final   Report Status 07/24/2015 FINAL  Final  Urine culture     Status: None   Collection Time: 07/19/15  5:39 PM  Result Value Ref Range Status   Specimen Description URINE, CLEAN CATCH  Final   Special Requests NONE  Final   Culture 20,000 COLONIES/mL YEAST  Final   Report Status 07/21/2015 FINAL  Final  Culture, blood (routine x 2) Call MD if unable to obtain prior to antibiotics being given     Status: None   Collection Time: 07/19/15  9:17 PM  Result Value Ref Range Status   Specimen Description BLOOD LEFT ARM  Final   Special Requests BOTTLES DRAWN AEROBIC AND ANAEROBIC 5CC  Final   Culture NO GROWTH 5 DAYS  Final   Report Status 07/24/2015 FINAL  Final  Culture, blood (routine x 2) Call MD  if unable to obtain prior to antibiotics being given     Status: None   Collection Time: 07/19/15  9:22 PM  Result Value Ref Range Status   Specimen Description BLOOD LEFT HAND  Final   Special Requests BOTTLES DRAWN AEROBIC AND ANAEROBIC 2.5CC  Final   Culture NO GROWTH 5 DAYS  Final   Report Status 07/24/2015 FINAL  Final     Studies:   Recent x-ray studies have been reviewed in detail by the Attending Physician  Scheduled Meds:  Scheduled Meds: . antiseptic oral rinse  7 mL Mouth Rinse q12n4p  . chlorhexidine  15 mL Mouth Rinse BID  . feeding supplement (PRO-STAT SUGAR FREE 64)  30 mL Per Tube BID  . folic acid  1 mg Per Tube Daily  . furosemide  20 mg Per Tube Daily  . metoprolol tartrate  25 mg Per Tube BID  . multivitamin with minerals  1 tablet Per Tube Daily  . pantoprazole sodium  40 mg Per Tube BID  . Rivaroxaban  15 mg Oral BID WC  . [START ON 08/14/2015] rivaroxaban  20 mg Oral Q supper  . thiamine  100 mg Per Tube Daily    Time spent on care of this patient: 25 mins   [facility] T , MD   [facility] Office  [phone] Pager - Text Page per Shea Evans as per below:  On-Call/Text Page:      [email or web address]      password [email or web address]  If 7PM-7AM, please contact night-coverage [email or web address] Password [email or web address] 07/26/2015, 4:00 PM   LOS: 7 days

## 2015-07-26 NOTE — Progress Notes (Signed)
Pt tolerating small sips of water without any observable difficulty, no coughing.  Sugar free jello provided. Pt able to feed self without difficulty.

## 2015-07-27 DIAGNOSIS — D62 Acute posthemorrhagic anemia: Secondary | ICD-10-CM | POA: Diagnosis not present

## 2015-07-27 DIAGNOSIS — R1312 Dysphagia, oropharyngeal phase: Secondary | ICD-10-CM | POA: Diagnosis not present

## 2015-07-27 DIAGNOSIS — J189 Pneumonia, unspecified organism: Secondary | ICD-10-CM | POA: Diagnosis not present

## 2015-07-27 DIAGNOSIS — M6281 Muscle weakness (generalized): Secondary | ICD-10-CM | POA: Diagnosis not present

## 2015-07-27 DIAGNOSIS — I825Z3 Chronic embolism and thrombosis of unspecified deep veins of distal lower extremity, bilateral: Secondary | ICD-10-CM | POA: Diagnosis not present

## 2015-07-27 DIAGNOSIS — Z931 Gastrostomy status: Secondary | ICD-10-CM | POA: Diagnosis not present

## 2015-07-27 DIAGNOSIS — I69391 Dysphagia following cerebral infarction: Secondary | ICD-10-CM | POA: Diagnosis not present

## 2015-07-27 DIAGNOSIS — Z8673 Personal history of transient ischemic attack (TIA), and cerebral infarction without residual deficits: Secondary | ICD-10-CM | POA: Diagnosis not present

## 2015-07-27 DIAGNOSIS — I69354 Hemiplegia and hemiparesis following cerebral infarction affecting left non-dominant side: Secondary | ICD-10-CM | POA: Diagnosis not present

## 2015-07-27 DIAGNOSIS — M109 Gout, unspecified: Secondary | ICD-10-CM | POA: Diagnosis not present

## 2015-07-27 DIAGNOSIS — F1010 Alcohol abuse, uncomplicated: Secondary | ICD-10-CM | POA: Diagnosis not present

## 2015-07-27 DIAGNOSIS — K9429 Other complications of gastrostomy: Secondary | ICD-10-CM | POA: Diagnosis not present

## 2015-07-27 DIAGNOSIS — D696 Thrombocytopenia, unspecified: Secondary | ICD-10-CM | POA: Diagnosis not present

## 2015-07-27 DIAGNOSIS — J80 Acute respiratory distress syndrome: Secondary | ICD-10-CM | POA: Diagnosis not present

## 2015-07-27 DIAGNOSIS — I5042 Chronic combined systolic (congestive) and diastolic (congestive) heart failure: Secondary | ICD-10-CM | POA: Diagnosis not present

## 2015-07-27 DIAGNOSIS — J810 Acute pulmonary edema: Secondary | ICD-10-CM | POA: Diagnosis not present

## 2015-07-27 DIAGNOSIS — I10 Essential (primary) hypertension: Secondary | ICD-10-CM | POA: Diagnosis not present

## 2015-07-27 DIAGNOSIS — G931 Anoxic brain damage, not elsewhere classified: Secondary | ICD-10-CM | POA: Diagnosis not present

## 2015-07-27 DIAGNOSIS — R1310 Dysphagia, unspecified: Secondary | ICD-10-CM | POA: Diagnosis not present

## 2015-07-27 DIAGNOSIS — J811 Chronic pulmonary edema: Secondary | ICD-10-CM | POA: Diagnosis not present

## 2015-07-27 DIAGNOSIS — R488 Other symbolic dysfunctions: Secondary | ICD-10-CM | POA: Diagnosis not present

## 2015-07-27 DIAGNOSIS — E785 Hyperlipidemia, unspecified: Secondary | ICD-10-CM | POA: Diagnosis not present

## 2015-07-27 LAB — CBC
HCT: 27.8 % — ABNORMAL LOW (ref 39.0–52.0)
HEMOGLOBIN: 8.7 g/dL — AB (ref 13.0–17.0)
MCH: 29.1 pg (ref 26.0–34.0)
MCHC: 31.3 g/dL (ref 30.0–36.0)
MCV: 93 fL (ref 78.0–100.0)
Platelets: 317 10*3/uL (ref 150–400)
RBC: 2.99 MIL/uL — AB (ref 4.22–5.81)
RDW: 16.8 % — ABNORMAL HIGH (ref 11.5–15.5)
WBC: 10.9 10*3/uL — ABNORMAL HIGH (ref 4.0–10.5)

## 2015-07-27 LAB — GLUCOSE, CAPILLARY
GLUCOSE-CAPILLARY: 98 mg/dL (ref 65–99)
GLUCOSE-CAPILLARY: 102 mg/dL — AB (ref 65–99)
Glucose-Capillary: 111 mg/dL — ABNORMAL HIGH (ref 65–99)
Glucose-Capillary: 128 mg/dL — ABNORMAL HIGH (ref 65–99)

## 2015-07-27 LAB — COMPREHENSIVE METABOLIC PANEL
ALK PHOS: 82 U/L (ref 38–126)
ALT: 35 U/L (ref 17–63)
AST: 39 U/L (ref 15–41)
Albumin: 2.2 g/dL — ABNORMAL LOW (ref 3.5–5.0)
Anion gap: 10 (ref 5–15)
BILIRUBIN TOTAL: 0.6 mg/dL (ref 0.3–1.2)
BUN: 37 mg/dL — AB (ref 6–20)
CO2: 25 mmol/L (ref 22–32)
CREATININE: 1.58 mg/dL — AB (ref 0.61–1.24)
Calcium: 8.1 mg/dL — ABNORMAL LOW (ref 8.9–10.3)
Chloride: 108 mmol/L (ref 101–111)
GFR calc Af Amer: 51 mL/min — ABNORMAL LOW (ref >60)
GFR, EST NON AFRICAN AMERICAN: 44 mL/min — AB (ref >60)
Glucose, Bld: 106 mg/dL — ABNORMAL HIGH (ref 65–99)
Potassium: 3.5 mmol/L (ref 3.5–5.1)
Sodium: 143 mmol/L (ref 135–145)
TOTAL PROTEIN: 5.9 g/dL — AB (ref 6.5–8.1)

## 2015-07-27 MED ORDER — FUROSEMIDE 20 MG PO TABS: 20.0000 mg | ORAL_TABLET | Freq: Every day | ORAL | Status: DC

## 2015-07-27 MED ORDER — METOPROLOL TARTRATE 50 MG PO TABS
50.0000 mg | ORAL_TABLET | Freq: Two times a day (BID) | ORAL | Status: DC
  Administered 2015-07-27: 50 mg
  Filled 2015-07-27 (×2): qty 1

## 2015-07-27 MED ORDER — JEVITY 1.2 CAL PO LIQD: 1000.0000 mL | ORAL | Status: DC

## 2015-07-27 MED ORDER — METOPROLOL TARTRATE 50 MG PO TABS: 50.0000 mg | ORAL_TABLET | Freq: Two times a day (BID) | ORAL | Status: DC

## 2015-07-27 MED ORDER — RIVAROXABAN 15 MG PO TABS: 15.0000 mg | ORAL_TABLET | Freq: Two times a day (BID) | ORAL | Status: DC

## 2015-07-27 MED ORDER — FUROSEMIDE 20 MG PO TABS
20.0000 mg | ORAL_TABLET | Freq: Every day | ORAL | Status: DC
  Administered 2015-07-27: 20 mg via ORAL
  Filled 2015-07-27: qty 1

## 2015-07-27 MED ORDER — POTASSIUM CHLORIDE CRYS ER 20 MEQ PO TBCR
30.0000 meq | EXTENDED_RELEASE_TABLET | Freq: Every day | ORAL | Status: DC
  Administered 2015-07-27: 30 meq via ORAL
  Filled 2015-07-27: qty 1

## 2015-07-27 MED ORDER — RIVAROXABAN 20 MG PO TABS: 20.0000 mg | ORAL_TABLET | Freq: Every day | ORAL | Status: DC

## 2015-07-27 MED ORDER — POTASSIUM CHLORIDE CRYS ER 15 MEQ PO TBCR: 30.0000 meq | EXTENDED_RELEASE_TABLET | Freq: Every day | ORAL | Status: DC

## 2015-07-27 NOTE — Progress Notes (Signed)
IV to RFA D/Cd, condom cath leaking, therefore removed.  Brief placed on patient for transport to [place].  Report called to [place], LPN.

## 2015-07-27 NOTE — Discharge Summary (Signed)
DISCHARGE SUMMARY  Mitchell [name]  MR#: [id number]  DOB:May 19, 1951  Date of Admission: 07/19/2015 Date of Discharge: 07/27/2015  Attending Physician:Kerissa Coia T  Patient's PCP:No primary care provider on file.  Consults:  none  Disposition: D/C to SNF for rehab stay   Follow-up Appts:     Follow-up Information    Follow up with Your ongoing medical care will be provided by the MD of record at your SNF.     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 EMS was activated. Wife attempted cpr at home. He was down for approximately 10 minutes when EMS arrived and noted he was in V fib. Shocked x 1 and epi x1 with return of circulation. Intubated and transported to [facility] ED. He was admitted, underwent a full workup, and was ultimately d/c to a SNF for rehab on 10/22. Said SNF returned the pt to the hospital the evening of the same day w/ a reported "fever" of 38C.   Hospital Course:  Acute Hypoxic Respiratory Failure - flash pulmonary edema +/- aspiration pneumonitis  Has been weaned back to room air with sats 97-99  percent - CXR noted "alveolar edema pattern" c/w simple pulmonary edema v/s aspiration pneumonitis - no temp >100.4 since admit - WBC quickly normalized but 10/28 rapidly increased to 16, then normalized the following day - I suspect this represents flash pulmonary edema with a possible contribution from aspiration pneumonitis from aspiration of secretions - follow-up chest x-ray 10/29 noted no further evidence of pulmonary edema - saturation 93-98% on room air at time of d/c w/ pt awake and alert and comfortable   Sinus tachycardia In a patient with a history of DVT and a recent prolonged hospitalization during which pharmacologic DVT prophylaxis could not be administered this raised concern for the possibility of pulmonary embolism - a d-dimer was markedly elevated - CT angiogram could not be safely accomplished given his renal insufficiency - lower extremity venous duplex noted chronic appearing extensive DVT in bilateral lower extremities - his wife confirmed that he was supposed to be taking Xarelto prior to his most recent admission - this has been resumed  Tracheal perforation (assumed traumatic intubation) - resolved -no evidence of ongoing bleeding at this time - clinically this appears to have resolved  Post V fib arrest  -S/p Hypothermia protocol last admit - anoxic brain injury appears to be slowly improving   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 -EEG w/o seizure activity last admit  -Brain MRI noted acute/subacute CVA during last admit  -Patient has made a dramatic improvement in regard to his mental status during the final 48hrs of this admission  H/o DVT - chronic B LE DVT -venous duplex this admit confirms chronic appearing DVT th/o B LE - continue Xarelto  Chronic Severe Systolic and diastolic CHF -EF 0000000 via TTE 07/11/2015 w/ diffuse hypokinesis - f/u TTE 10/24 notes improvement in EF to 30-35% and grade 2 DD - wgt at time of d/c 76kg -  72.5kg on date of this d/c - continue diuresis after d/c at lower dose and follow renal function - no ACE/ARB at present due to renal insufficiency   HTN -BP currently controlled   Acute Renal Failure/ ATN post arrest -Creatinine stabilized around 1.6 near end of hospital stay - ongoing monitoring in outpt setting is indicated   Nutrition -cleared by SLP for D3 (mech soft) thin liquid diet prior to d/c on 10/21 but then promptly aspirated -PEG in IR 10/21 -tolerating trial of clear liquids at time of d/c - consider slowly advancing back to D3 diet w/ thin liquids if mental status remains intact at SNF   Anemia  -Acute blood loss from tracheal tear + PEG placement - Hgb holding steady at time of d/c   Thrombocytopenia  -resolved   ETOH abuse  - there was suspicion of surreptitious use while in hospital but EtOH level and UDS unremarkable     Medication List    STOP taking these medications        predniSONE 20 MG tablet  Commonly known as:  DELTASONE      TAKE these medications        aspirin 81 MG EC tablet  Take 1 tablet (81 mg total) by mouth daily.     feeding supplement (JEVITY 1.2 CAL) Liqd  Place 1,000 mLs into feeding tube continuous.     feeding supplement (PRO-STAT SUGAR FREE 64) Liqd  Place 30 mLs into feeding tube 2 (two) times daily.     folic acid 1 MG tablet  Commonly known as:  FOLVITE  Take 1 tablet (1 mg total) by mouth daily.     furosemide 20 MG tablet  Commonly known as:  LASIX  Take 1 tablet (20 mg total) by mouth daily.     metoprolol 50 MG tablet  Commonly known as:  LOPRESSOR  Place 1 tablet (50 mg total) into feeding tube 2 (two) times daily.     multivitamin Tabs tablet  Take 1 tablet by mouth daily.     pantoprazole 40 MG tablet  Commonly known as:  PROTONIX  Take 1 tablet (40 mg total) by mouth daily.     potassium chloride SA 15 MEQ tablet  Commonly known as:  KLOR-CON M15  Take 2 tablets (30 mEq total) by mouth daily.      Rivaroxaban 15 MG Tabs tablet  Commonly known as:  XARELTO  Take 1 tablet (15 mg total) by mouth 2 (two) times daily with a meal.     rivaroxaban 20 MG Tabs tablet  Commonly known as:  XARELTO  Take 1 tablet (20 mg total) by mouth daily with supper.  Start taking on:  08/14/2015     thiamine 100 MG tablet  Take 1 tablet (100 mg total) by mouth daily.       Day of Discharge BP 127/78 mmHg  Pulse 105  Temp(Src) 99.2 F (37.3 C) (Oral)  Resp 24  Ht 5\' 8"  (1.727 m)  Wt 72.5 kg (159 lb 13.3 oz)  BMI 24.31 kg/m2  SpO2 94%  Physical Exam: General: No acute respiratory distress - alert and conversant  Lungs: Clear to auscultation bilaterally without wheezes or crackles Cardiovascular: Regular rate and rhythm without murmur gallop or rub Abdomen: Nontender, nondistended, soft, bowel sounds positive, no rebound, no ascites, no appreciable mass Extremities: No significant cyanosis, clubbing, or edema bilateral lower extremities  Basic Metabolic Panel:  Recent Labs Lab 07/20/15 1630 07/21/15 0314 07/22/15 0319 07/23/15 0328 07/24/15 0247 07/26/15 0444 07/27/15 0225  NA 139 143 144  --  149* 147* 143  K 4.4 4.1 3.9  --  3.7 3.6 3.5  CL 110 113* 111  --  111 112* 108  CO2 20* 22 22  --  28 28 25   GLUCOSE 134* 120* 123*  --  124* 134* 106*  BUN 34* 39* 45*  --  44* 41* 37*  CREATININE 1.74* 1.82* 1.72*  --  1.38* 1.61* 1.58*  CALCIUM 8.1* 8.4* 8.5*  --  8.4* 8.3* 8.1*  MG 1.9 1.9 2.0 1.9  --   --   --     Liver Function Tests:  Recent Labs Lab 07/21/15 0314 07/22/15 0319 07/24/15 0247 07/26/15 0444 07/27/15 0225  AST 25 37 54* 39 39  ALT 13* 16* 28 35 35  ALKPHOS 56 65 88 85 82  BILITOT 0.8 0.7 0.6 0.6 0.6  PROT 6.0* 6.0* 5.9* 5.8* 5.9*  ALBUMIN 1.9* 2.0* 2.2* 2.1* 2.2*   CBC:  Recent Labs Lab 07/20/15 1630 07/21/15 0314  07/23/15 2306 07/24/15 0247 07/25/15 0252 07/26/15 0444 07/27/15 0225  WBC 10.7* 9.1  < > 9.3 9.0 16.1* 9.9 10.9*  NEUTROABS 9.8*  7.1  --   --   --   --   --   --   HGB 8.3* 7.9*  < > 8.6* 8.3* 10.1* 8.4* 8.7*  HCT 23.5* 23.3*  < > 26.4* 25.7* 31.3* 25.6* 27.8*  MCV 86.7 88.9  < > 90.7 92.4 92.1 93.8 93.0  PLT 275 271  < > 327 314 429* 298 317  < > = values in this interval not displayed.  Cardiac Enzymes:  Recent Labs Lab 07/20/15 1630 07/20/15 2016 07/21/15 0314  TROPONINI 0.05* 0.06* 0.04*   BNP (last 3 results)  Recent Labs  07/19/15 1718  BNP 3064.0*    CBG:  Recent Labs Lab 07/26/15 1455 07/26/15 1921 07/26/15 2320 07/27/15 0330 07/27/15 0752  GLUCAP 144* 107* 98 111* 102*    Recent Results (from the past 240 hour(s))  Blood Culture (routine x 2)     Status: None   Collection Time: 07/19/15  5:18 PM  Result Value Ref Range Status   Specimen Description BLOOD LEFT HAND  Final   Special Requests BOTTLES DRAWN AEROBIC AND ANAEROBIC 5CC  Final   Culture NO GROWTH 5 DAYS  Final   Report Status 07/24/2015 FINAL  Final  Blood Culture (routine x 2)     Status: None   Collection Time: 07/19/15  5:20 PM  Result Value Ref Range Status   Specimen Description BLOOD RIGHT ANTECUBITAL  Final   Special Requests BOTTLES DRAWN AEROBIC ONLY 5CC  Final   Culture NO GROWTH 5 DAYS  Final   Report Status 07/24/2015 FINAL  Final  Urine culture     Status: None   Collection Time: 07/19/15  5:39 PM  Result Value Ref Range Status   Specimen Description URINE, CLEAN CATCH  Final   Special Requests NONE  Final   Culture 20,000 COLONIES/mL YEAST  Final   Report Status 07/21/2015 FINAL  Final  Culture, blood (routine x  2) Call MD if unable to obtain prior to antibiotics being given     Status: None   Collection Time: 07/19/15  9:17 PM  Result Value Ref Range Status   Specimen Description BLOOD LEFT ARM  Final   Special Requests BOTTLES DRAWN AEROBIC AND ANAEROBIC 5CC  Final   Culture NO GROWTH 5 DAYS  Final   Report Status 07/24/2015 FINAL  Final  Culture, blood (routine x 2) Call MD if unable to obtain  prior to antibiotics being given     Status: None   Collection Time: 07/19/15  9:22 PM  Result Value Ref Range Status   Specimen Description BLOOD LEFT HAND  Final   Special Requests BOTTLES DRAWN AEROBIC AND ANAEROBIC 2.5CC  Final   Culture NO GROWTH 5 DAYS  Final   Report Status 07/24/2015 FINAL  Final      Time spent in discharge (includes decision making & examination of pt): >35 minutes  07/27/2015, 10:14 AM   Cherene Altes, MD [facility] Office  [phone] Pager [phone]  On-Call/Text Page:      [email or web address]      password [facility]

## 2015-07-27 NOTE — Progress Notes (Signed)
CBG 102 

## 2015-07-28 ENCOUNTER — Non-Acute Institutional Stay (SKILLED_NURSING_FACILITY): Payer: Medicare Other | Admitting: Internal Medicine

## 2015-07-28 ENCOUNTER — Encounter: Payer: Self-pay | Admitting: Internal Medicine

## 2015-07-28 DIAGNOSIS — I5042 Chronic combined systolic (congestive) and diastolic (congestive) heart failure: Secondary | ICD-10-CM | POA: Diagnosis not present

## 2015-07-28 DIAGNOSIS — E785 Hyperlipidemia, unspecified: Secondary | ICD-10-CM

## 2015-07-28 DIAGNOSIS — I10 Essential (primary) hypertension: Secondary | ICD-10-CM

## 2015-07-28 DIAGNOSIS — Z931 Gastrostomy status: Secondary | ICD-10-CM | POA: Insufficient documentation

## 2015-07-28 DIAGNOSIS — D696 Thrombocytopenia, unspecified: Secondary | ICD-10-CM

## 2015-07-28 DIAGNOSIS — J810 Acute pulmonary edema: Secondary | ICD-10-CM | POA: Diagnosis not present

## 2015-07-28 DIAGNOSIS — G931 Anoxic brain damage, not elsewhere classified: Secondary | ICD-10-CM

## 2015-07-28 DIAGNOSIS — F1010 Alcohol abuse, uncomplicated: Secondary | ICD-10-CM

## 2015-07-28 DIAGNOSIS — M109 Gout, unspecified: Secondary | ICD-10-CM

## 2015-07-28 DIAGNOSIS — Z8673 Personal history of transient ischemic attack (TIA), and cerebral infarction without residual deficits: Secondary | ICD-10-CM

## 2015-07-28 NOTE — Progress Notes (Signed)
Patient ID: Mitchell [name], male   DOB: 14-Oct-1950, 64 y.o.   MRN: [id number]    HISTORY AND PHYSICAL   DATE: 07/28/15  Location:  [facility]    Place of Service: SNF (31)   Extended Emergency Contact Information Primary Emergency Contact: Uptain,Shirley Address: [address]          [place] [address] [place] Phone: [phone] Relation: Spouse  Advanced Directive information  FULL CODE  Chief Complaint  Patient presents with  . 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 He had 2D echo which showed improvement in EF 30-35% with grade 2 diastolic dysfunction. CXR revealed alveolar edema pattern with f/u CXR neg. Cr 1.6 @ d/c (previously 1.74). BNP 3064.  He has no c/o. He does have weakness and dysphagia but does okay with thin liquids. He is getting all meds through [facility]. He does notice that his taste buds have changed since stroke and things taste saltier than they actually are.   HTN - stable on ASA, lasix/potassium supplement   Gout - stable  Hyperlipidemia - stable with diet control  Hx DVT - he has been noncompliant with xeralto in the past but willing to take med as ordered going forward.   Etoh abuse - takes folate and thiamine. He takes protonix  Past Medical History  Diagnosis Date  . Hypertension   . Alcohol abuse   . Gout   . Hyperlipemia     Past Surgical History  Procedure Laterality Date  . Radiology with anesthesia N/A 07/17/2015    Procedure: RADIOLOGY WITH ANESTHESIA;  Surgeon: Medication Radiologist, MD;  Location: [place];  Service: Radiology;  Laterality: N/A;    No care team member to display  Social History   Social  History  . Marital Status: Married    Spouse Name: N/A  . Number of Children: N/A  . Years of Education: N/A   Occupational History  . Not on file.   Social History Main Topics  . Smoking status: Current Some Day Smoker    Types: Cigars  . Smokeless tobacco: Not on file  . Alcohol Use: Not on file  . Drug Use: Not on file  . Sexual Activity: Not on file   Other Topics Concern  . Not on file   Social History Narrative     reports that he has been smoking Cigars.  He does not have any smokeless tobacco history on file. His alcohol and drug histories are not on file.  No family history on file. No family status information on file.    Immunization History  Administered Date(s) Administered  . Pneumococcal Polysaccharide-23 07/11/2015  . Td 07/28/1997    Allergies  Allergen Reactions  . Hydrochlorothiazide     REACTION: precipitates gout  . Pseudoephedrine     REACTION: rash    Medications: Patient's Medications  New Prescriptions   No medications on file  Previous Medications   AMINO ACIDS-PROTEIN HYDROLYS (FEEDING SUPPLEMENT, PRO-STAT SUGAR FREE 64,) LIQD    Place 30 mLs into feeding tube 2 (two) times daily.   ASPIRIN EC 81 MG EC TABLET    Take 1 tablet (81  mg total) by mouth daily.   FOLIC ACID (FOLVITE) 1 MG TABLET    Take 1 tablet (1 mg total) by mouth daily.   FUROSEMIDE (LASIX) 20 MG TABLET    Take 1 tablet (20 mg total) by mouth daily.   METOPROLOL (LOPRESSOR) 50 MG TABLET    Place 1 tablet (50 mg total) into feeding tube 2 (two) times daily.   MULTIVITAMIN (PROSIGHT) TABS TABLET    Take 1 tablet by mouth daily.   NUTRITIONAL SUPPLEMENTS (FEEDING SUPPLEMENT, JEVITY 1.2 CAL,) LIQD    Place 1,000 mLs into feeding tube continuous.   PANTOPRAZOLE (PROTONIX) 40 MG TABLET    Take 1 tablet (40 mg total) by mouth daily.   POTASSIUM CHLORIDE (KLOR-CON M15) 15 MEQ TABLET    Take 2 tablets (30 mEq total) by mouth daily.   RIVAROXABAN (XARELTO) 15 MG TABS TABLET     Take 1 tablet (15 mg total) by mouth 2 (two) times daily with a meal.   RIVAROXABAN (XARELTO) 20 MG TABS TABLET    Take 1 tablet (20 mg total) by mouth daily with supper.   THIAMINE 100 MG TABLET    Take 1 tablet (100 mg total) by mouth daily.  Modified Medications   No medications on file  Discontinued Medications   No medications on file    Review of Systems  Unable to perform ROS: Other    Filed Vitals:   07/28/15 1607  BP: 115/79  Pulse: 111  Temp: 99.6 F (37.6 C)  SpO2: 97%   There is no weight on file to calculate BMI.  Physical Exam  Constitutional: He appears well-developed. No distress.  Spouse present. Sitting in bed in NAD, frail appearing  HENT:  Mouth/Throat: Oropharynx is clear and moist.  Eyes: Pupils are equal, round, and reactive to light. No scleral icterus.  Neck: Neck supple. Carotid bruit is not present. No tracheal deviation present. No thyromegaly present.  Cardiovascular: Normal rate, regular rhythm and intact distal pulses.  Exam reveals no gallop and no friction rub.   Murmur (1/6 SEM) heard. no distal LE swelling. No calf TTP  Pulmonary/Chest: Effort normal. He has wheezes (right end expiratory wheezing with prolonged expiratory phase. left lung cleatr). He has no rales. He exhibits no tenderness.  Abdominal: Soft. Bowel sounds are normal. He exhibits no distension, no abdominal bruit, no pulsatile midline mass and no mass. There is no tenderness. There is no rebound and no guarding.  Musculoskeletal: He exhibits edema and tenderness.  Lymphadenopathy:    He has no cervical adenopathy.  Neurological: He is alert.  Reduced left hand grip strength  Skin: Skin is warm and dry. No rash noted.  Psychiatric: He has a normal mood and affect. His behavior is normal.     Labs reviewed: Admission on 07/19/2015, Discharged on 07/27/2015  No results displayed because visit has over 200 results.    CMP Latest Ref Rng 07/27/2015 07/26/2015 07/24/2015    Glucose 65 - 99 mg/dL 106(H) 134(H) 124(H)  BUN 6 - 20 mg/dL 37(H) 41(H) 44(H)  Creatinine 0.61 - 1.24 mg/dL 1.58(H) 1.61(H) 1.38(H)  Sodium 135 - 145 mmol/L 143 147(H) 149(H)  Potassium 3.5 - 5.1 mmol/L 3.5 3.6 3.7  Chloride 101 - 111 mmol/L 108 112(H) 111  CO2 22 - 32 mmol/L 25 28 28   Calcium 8.9 - 10.3 mg/dL 8.1(L) 8.3(L) 8.4(L)  Total Protein 6.5 - 8.1 g/dL 5.9(L) 5.8(L) 5.9(L)  Total Bilirubin 0.3 - 1.2 mg/dL 0.6 0.6 0.6  Alkaline Phos 38 - 126 U/L 82 85 88  AST 15 - 41 U/L 39 39 54(H)  ALT 17 - 63 U/L 35 35 28    CBC Latest Ref Rng 07/27/2015 07/26/2015 07/25/2015  WBC 4.0 - 10.5 K/uL 10.9(H) 9.9 16.1(H)  Hemoglobin 13.0 - 17.0 g/dL 8.7(L) 8.4(L) 10.1(L)  Hematocrit 39.0 - 52.0 % 27.8(L) 25.6(L) 31.3(L)  Platelets 150 - 400 K/uL 317 298 429(H)            Ct Head Wo Contrast  07/08/2015  CLINICAL DATA:  Status post cardiac arrest. EXAM: CT HEAD WITHOUT CONTRAST TECHNIQUE: Contiguous axial images were obtained from the base of the skull through the vertex without intravenous contrast. COMPARISON:  None. FINDINGS: No mass effect or midline shift. No evidence of acute intracranial hemorrhage, or infarction. No abnormal extra-axial fluid collections. Gray-white matter differentiation is normal. Basal cisterns are preserved. There is mild brain parenchymal atrophy and chronic small vessel disease changes. No depressed skull fractures. Visualized paranasal sinuses and mastoid air cells are not opacified. IMPRESSION: No acute intracranial abnormality. Mild brain parenchymal atrophy and chronic microvascular disease. Electronically Signed   By: Fidela Salisbury M.D.   On: 07/08/2015 07:59   Ct Angio Chest Pe W/cm &/or Wo Cm  07/08/2015  CLINICAL DATA:  Hypoxia.  Status post cardiac arrest. EXAM: CT ANGIOGRAPHY CHEST WITH CONTRAST TECHNIQUE: Multidetector CT imaging of the chest was performed using the standard protocol during bolus administration of intravenous contrast.  Multiplanar CT image reconstructions and MIPs were obtained to evaluate the vascular anatomy. CONTRAST:  35mL OMNIPAQUE IOHEXOL 350 MG/ML SOLN COMPARISON:  Chest radiograph July 08, 2015 FINDINGS: There is no demonstrable pulmonary embolus. There is no appreciable thoracic aortic aneurysm. There is patchy atherosclerotic disease in the aortic arch region. Visualized great vessels show mild atherosclerotic change. There is extensive soft tissue air in the visualized lower neck tracking into the left superior mediastinum. There is an endotracheal tube present. Air is seen tracking slightly superior to the endotracheal tube in the lower neck region at the level of the thyroid, raising concern for potential tracheal injury. There is bibasilar atelectatic change. Areas of patchy ground-glass opacity bilaterally most likely represent early pulmonary edema. No pneumothorax. Visualized thyroid appears unremarkable. No adenopathy is appreciable. There are scattered foci of coronary artery calcification. Pericardium is not thickened. In the visualized upper abdomen, there are no appreciable lesions. There are no blastic or lytic bone lesions. There is degenerative change in the thoracic spine. Review of the MIP images confirms the above findings. IMPRESSION: There is extensive air in the neck and upper mediastinal region. Air is noted just anterior to the trachea in the lower neck region. Given the history and the presence of the endotracheal tube, question traumatic intubation with tracheal injury. No demonstrable pulmonary embolus.  No pneumothorax. Evidence of early pulmonary edema.  Bibasilar atelectatic change. No adenopathy. Critical Value/emergent results were called by telephone at the time of interpretation on 07/08/2015 at 8:01 am to Dr. Pryor Curia , who verbally acknowledged these results. Electronically Signed   By: Lowella Grip III M.D.   On: 07/08/2015 08:01   Mr Brain Wo Contrast  07/17/2015   CLINICAL DATA:  Mental status change. Recent cardiac arrest with ventricular fibrillation. Question the knocked brain injury versus CVA. EXAM: MRI HEAD WITHOUT CONTRAST TECHNIQUE: Multiplanar, multiecho pulse sequences of the brain and surrounding structures were obtained without intravenous contrast. COMPARISON:  CT head without contrast 07/08/2015 FINDINGS: A linear subcortical white  matter infarct in the left frontal lobe measures 14 mm in cephalo caudad dimension. There is no acute hemorrhage or mass lesion. Minimal supratentorial T2 changes are within normal limits for age. There is some white matter change within the brainstem. The internal auditory canals are within normal limits. Flow is present in the major intracranial arteries. Globes and orbits are intact. Fluid is present in the nasopharynx. Mild mucosal thickening is present in the maxillary sinuses and ethmoid air cells bilaterally. There is some fluid in the mastoid air cells bilaterally. No obstructing nasopharyngeal lesion is present. The ventricles are of normal size. No significant extra-axial fluid collection is present. IMPRESSION: 1. Acute/subacute linear infarct in the subcortical white matter of the left frontal lobe. 2. No other significant supratentorial white matter disease. 3. Minimal T2 changes within the brainstem suggesting microvascular disease. 4. Fluid layering in the in nasopharynx and mild sinus disease as described. This is likely related to the patient's hospitalization and previous intubation. Patient was extubated 4 days ago. Electronically Signed   By: San Morelle M.D.   On: 07/17/2015 16:47   Ir Gastrostomy Tube Mod Sed  07/17/2015  CLINICAL DATA:  64 year old male with a history of dysphagia. History of cerebral vascular accident. He has been referred for placement of percutaneous gastrostomy tube. EXAM: PERCUTANEOUS GASTROSTOMY FLUOROSCOPY TIME:  1 minutes 30 seconds MEDICATIONS AND MEDICAL HISTORY: Versed  1.0 mg, Fentanyl 50 mcg. ANESTHESIA/SEDATION: Moderate sedation time: 10 minutes CONTRAST:  10 cc enteric through the tube PROCEDURE: The procedure, risks, benefits, and alternatives were explained to the patient's family. Questions regarding the procedure were encouraged and answered. The patient understands and consents to the procedure. The epigastrium was prepped with Betadine in a sterile fashion, and a sterile drape was applied covering the operative field. A sterile gown and sterile gloves were used for the procedure. A 5-French orogastric tube is placed under fluoroscopic guidance. Scout imaging of the abdomen confirms barium within the transverse colon. The stomach was distended with gas. Under fluoroscopic guidance, an 18 gauge needle was utilized to puncture the anterior wall of the body of the stomach. An Amplatz wire was advanced through the needle passing a T fastener into the lumen of the stomach. The T fastener was secured for gastropexy. A 9-French sheath was inserted. A snare was advanced through the 9-French sheath. A Britta Mccreedy was advanced through the orogastric tube. It was snared then pulled out the oral cavity, pulling the snare, as well. The leading edge of the gastrostomy was attached to the snare. It was then pulled down the esophagus and out the percutaneous site. It was secured in place. Contrast was injected. No complication. COMPLICATIONS: None FINDINGS: The image demonstrates placement of a 20-French pull-through type gastrostomy tube into the body of the stomach. IMPRESSION: Status post placement of 20 French pull-through percutaneous gastrostomy tube. Signed, Dulcy Fanny. Earleen Newport, DO [facility] Electronically Signed   By: Corrie Mckusick D.O.   On: 07/17/2015 16:30   Dg Chest Port 1 View  07/26/2015  CLINICAL DATA:  Pulmonary edema. EXAM: PORTABLE CHEST 1 VIEW COMPARISON:  07/25/2015 FINDINGS: There is further improvement and  edema pattern which has essentially resolved. There is no evidence of airspace consolidation, pneumothorax, nodule or pleural fluid. The heart size is normal. IMPRESSION: Resolution of pulmonary edema. Electronically Signed   By: Aletta Edouard M.D.   On: 07/26/2015 09:17   Dg Chest Port 1 View  07/25/2015  CLINICAL DATA:  Pulmonary  edema EXAM: PORTABLE CHEST 1 VIEW COMPARISON:  07/22/2015 FINDINGS: Borderline cardiomegaly again noted. Persistent central mild vascular congestion and mild central pulmonary edema. No segmental infiltrate. No pneumothorax. IMPRESSION: Persistent central mild vascular congestion and mild central pulmonary edema. No segmental infiltrate. No pneumothorax. Electronically Signed   By: Lahoma Crocker M.D.   On: 07/25/2015 08:16   Dg Chest Port 1 View  07/22/2015  CLINICAL DATA:  The EXAM: PORTABLE CHEST 1 VIEW COMPARISON:  July 21, 2015 FINDINGS: Perihilar alveolar opacity is again noted. No new opacity is seen. Heart is upper normal in size with pulmonary vascularity showing evidence of pulmonary venous hypertension. There is left lower lobe atelectatic change. No adenopathy appreciable. IMPRESSION: Perihilar alveolar opacity, likely edema. There is evidence of pulmonary venous hypertension. These findings are felt to be indicative of a degree of congestive heart failure. No new opacity. There is atelectatic change in the left base, stable. Electronically Signed   By: Lowella Grip III M.D.   On: 07/22/2015 08:06   Dg Chest Port 1 View  07/21/2015  CLINICAL DATA:  Pulmonary edema EXAM: CHEST 1 VIEW COMPARISON:  Yesterday FINDINGS: Study dictated in down time. Symmetric perihilar airspace disease is unchanged. No evidence of increasing pleural fluid. Stable normal heart size. Stable upper mediastinal width. No pneumothorax. IMPRESSION: Unchanged alveolar edema pattern. There may be superimposed aspiration or pneumonia in this patient with fever. Electronically Signed   By:  Monte Fantasia M.D.   On: 07/21/2015 07:42   Dg Chest Port 1 View  07/20/2015  CLINICAL DATA:  Fever, acute respiratory distress, shortness of breath EXAM: PORTABLE CHEST 1 VIEW COMPARISON:  None. FINDINGS: Multifocal patchy/ perihilar opacities bilaterally, increased. This appearance suggests moderate interstitial edema. Multifocal pneumonia (possibly on the basis of aspiration) is also possible. No definite pleural effusion.  No pneumothorax Cardiomegaly. IMPRESSION: Multifocal patchy/perihilar opacities bilaterally, increased, suggesting moderate interstitial edema. Multifocal pneumonia (possibly on the basis of aspiration) is also possible. Electronically Signed   By: Julian Hy M.D.   On: 07/20/2015 14:42   Dg Chest Port 1 View  07/19/2015  CLINICAL DATA:  Respiratory distress, chest pain EXAM: PORTABLE CHEST 1 VIEW COMPARISON:  06/28/2015 FINDINGS: Cardiomegaly with perihilar edema. No definite pleural effusions. No pneumothorax. Interval removal of left IJ venous catheter. IMPRESSION: Cardiomegaly with perihilar edema. Electronically Signed   By: Julian Hy M.D.   On: 07/19/2015 17:38   Dg Chest Port 1 View  07/18/2015  CLINICAL DATA:  Hypoxia. EXAM: PORTABLE CHEST 1 VIEW COMPARISON:  07/13/2015 FINDINGS: Left internal jugular central venous catheter tip in the proximal SVC. Cardiomediastinal contours are unchanged. Persistent bilateral perihilar opacities suspicious for pulmonary edema, with mild improvement from the left and unchanged appearance in the right allowing for differences in technique. Question layering pleural effusions, with retrocardiac opacity. No pneumothorax. Overlying artifacts project over the upper hemithorax. IMPRESSION: Minimal improved aeration of the left perihilar region, with persistent perihilar opacities suspicious for pulmonary edema. Probable pleural effusions, retrocardiac opacity likely effusion and atelectasis. Electronically Signed   By: Jeb Levering M.D.   On: 07/18/2015 02:29   Dg Chest Port 1 View  07/13/2015  CLINICAL DATA:  Acute respiratory failure with hypoxemia EXAM: PORTABLE CHEST 1 VIEW COMPARISON:  1 day prior FINDINGS: Left internal jugular line tip at mid SVC. Midline trachea. Upper normal heart size. Probable layering bilateral pleural effusions. No pneumothorax. Relatively diffuse interstitial and airspace disease. Slightly progressive, especially on the left. IMPRESSION: Slight worsening aeration. At  least partially felt to be due to pulmonary edema. Multifocal infection or aspiration cannot be excluded. Probable layering bilateral pleural effusions. Electronically Signed   By: Abigail Miyamoto M.D.   On: 07/13/2015 07:59   Dg Chest Port 1 View  07/12/2015  CLINICAL DATA:  64 year old male with pulmonary edema. EXAM: PORTABLE CHEST 1 VIEW COMPARISON:  Chest x-ray 07/11/2015. FINDINGS: Patient has been extubated. Nasogastric tube has been removed. Left internal jugular central venous catheter with tip terminating in the mid superior vena cava. Lung volumes are slightly low. Worsening airspace consolidation throughout the lungs bilaterally (asymmetrically distributed), most severe in the right mid to lower lung. No definite pleural effusions. Cephalization of the pulmonary vasculature. Heart size is normal. The patient is rotated to the left on today's exam, resulting in distortion of the mediastinal contours and reduced diagnostic sensitivity and specificity for mediastinal pathology. IMPRESSION: 1. Support apparatus, as above. 2. Worsening asymmetrically distributed airspace consolidation in the lungs bilaterally, most severe in the right lower lobe, concerning for developing multilobar pneumonia, potentially sequela of recent aspiration. 3. Given the cephalization of pulmonary vasculature, some of these findings could be related to developing pulmonary edema, however, this is not favored given the normal cardiac size. Electronically  Signed   By: Vinnie Langton M.D.   On: 07/12/2015 08:03   Dg Chest Port 1 View  07/11/2015  CLINICAL DATA:  Cardiac arrest.  Hypertension . EXAM: PORTABLE CHEST 1 VIEW COMPARISON:  07/08/2015. FINDINGS: Endotracheal tube, NG tube, left IJ line in stable position. Mild infiltrate left lung base. Small left pleural effusion. Mild cardiomegaly. No pulmonary venous congestion No pneumothorax IMPRESSION: 1. Lines and tubes in stable position. 2. Mild left base infiltrate and left pleural effusion. 3. Mild cardiomegaly.  No pulmonary venous congestion . Electronically Signed   By: Marcello Moores  Register   On: 07/11/2015 07:21   Dg Chest Port 1 View  07/08/2015  CLINICAL DATA:  Central line placement.  Ventilator.  Post CPR EXAM: PORTABLE CHEST 1 VIEW COMPARISON:  07/08/2015 FINDINGS: Endotracheal tube 3 cm above the carina unchanged. Left jugular central venous catheter tip in the SVC in good position. No pneumothorax. NG tube in the stomach. Improved aeration of the lungs. Lungs are now clear without infiltrate or atelectasis. No effusion. IMPRESSION: Endotracheal tube in good position. Central venous catheter in good position. Improved aeration.  Lungs are clear. Electronically Signed   By: Franchot Gallo M.D.   On: 07/08/2015 09:26   Dg Chest Portable 1 View  07/08/2015  CLINICAL DATA:  Post code.  Assess endotracheal tube. EXAM: PORTABLE CHEST 1 VIEW COMPARISON:  None. FINDINGS: Endotracheal tube with tip measuring 3 cm above the carinal. Additional catheter projected over the left side of the neck is of nonspecific etiology and may be external. Shallow inspiration. Normal heart size and pulmonary vascularity. No focal airspace disease or consolidation in the lungs. No pneumothorax. Visualized ribs appear intact. Degenerative changes in the spine and shoulders. IMPRESSION: Endotracheal tube tip measures 3 cm above the carinal. No evidence of active pulmonary disease. Electronically Signed   By: Lucienne Capers M.D.   On: 07/08/2015 06:57   Dg Abd Portable 1v  07/14/2015  CLINICAL DATA:  Repositioning of feeding tube EXAM: PORTABLE ABDOMEN - 1 VIEW COMPARISON:  Radiograph 07/14/2015 FINDINGS: Feeding tube with tip in the second portion duodenum. IMPRESSION: Feeding tube with weighted tip in second portion duodenum. Electronically Signed   By: Suzy Bouchard M.D.   On: 07/14/2015 15:42  Dg Abd Portable 1v  07/14/2015  CLINICAL DATA:  Feeding tube placement. EXAM: PORTABLE ABDOMEN - 1 VIEW COMPARISON:  None. FINDINGS: The tip of the feeding tube is in the fundus of the stomach. There is a loop of the feeding tube which extends into the distal stomach. Bowel gas pattern is normal. IMPRESSION: Feeding tube in the stomach as described. Electronically Signed   By: Lorriane Shire M.D.   On: 07/14/2015 13:14   Dg Swallowing Func-speech Pathology  07/18/2015  Objective Swallowing Evaluation:   Patient Details Name: Kiran Rawles MRN: [id number] Date of Birth: 08/07/1951 Today's Date: 07/18/2015 Time: SLP Start Time (ACUTE ONLY): 1145-SLP Stop Time (ACUTE ONLY): 1210 SLP Time Calculation (min) (ACUTE ONLY): 25 min Past Medical History: Past Medical History Diagnosis Date . Hypertension  . Alcohol abuse  . Gout  . Hyperlipemia  Past Surgical History: No past surgical history on file. HPI: Other Pertinent Information: Pt is a 64 yo AAM with incomplete pmh but known HTN, Gout, Hyperlipidemia, ETOH abuse who was noted to have respiratory distress and odd resp sounds per wife 1 min after laying back down early am 10/11 and EMS was activated. Wife attempted cpr at home. He was down for approximately 10 minutes AND when EMS he was in V fib. Shocked x 1 and epi x1 with return of circulation. Intubated and transported to [facility] ED. He was taken for CT of chest and head to rule out PE and or Neurological injury. Pt with possible tracheal/laryngeal/esophageal/pharyngeal injury from emergent intubation. Intubated 10/11-10/14.  ENT perfomed laryngoscopy however unable to visualize larynx due to copious secretions.  No Data Recorded Assessment / Plan / Recommendation CHL IP CLINICAL IMPRESSIONS 07/18/2015 Therapy Diagnosis Mild oral phase dysphagia;Mild pharyngeal phase dysphagia Clinical Impression Pt presents with a mild oral dysphagia with anterior mastication and slow bolus formation and transit with solids. Oropharyngeal phase also mildy impaired with a mild delay in swallow iniatition with resuting trace penetration before/during the swallow with large consecutive sips. Pt also with mild weakness of the hyolaryngeal mechanism with mild residuals post swallow. Chin tuck does not aid in transit, though cued effortful swallow shows potential for improvement. Recommend pt initiate a dys 3 (mechanical soft) diet with thin liquids, being careful to take one single sip at a time, avoid straws and take pills whole in puree. Pt was able to repeat these precautions and return demonstrate independently with water after session complete. SLP will f/u for tolerance.    CHL IP TREATMENT RECOMMENDATION 07/18/2015 Treatment Recommendations Therapy as outlined in treatment plan below   CHL IP DIET RECOMMENDATION 07/18/2015 SLP Diet Recommendations Dysphagia 3 (Mech soft);Thin Liquid Administration via (None) Medication Administration Whole meds with puree Compensations Slow rate;Small sips/bites Postural Changes and/or Swallow Maneuvers (None)   CHL IP OTHER RECOMMENDATIONS 07/18/2015 Recommended Consults (None) Oral Care Recommendations Oral care BID Other Recommendations (None)   No flowsheet data found.  CHL IP FREQUENCY AND DURATION 07/18/2015 Speech Therapy Frequency (ACUTE ONLY) min 2x/week Treatment Duration 2 weeks   Pertinent Vitals/Pain NA  SLP Swallow Goals No flowsheet data found. No flowsheet data found.   CHL IP REASON FOR REFERRAL 07/18/2015 Reason for Referral Objectively evaluate swallowing function         Herbie Baltimore, MA CCC-SLP  [id number] Lynann Beaver 07/18/2015, 1:56 PM     Assessment/Plan   ICD-9-CM ICD-10-CM   1. Acute pulmonary edema (HCC) - improved 518.4 J81.0   2. Chronic combined systolic and diastolic CHF (congestive heart  failure) ([place]) - stable 428.42 I50.42    428.0    3. PEG (percutaneous endoscopic gastrostomy) status ([place]) due to dysphagia V44.1 Z93.1   4. Anoxic brain injury ([place]) - improving 348.1 G93.1   5. Thrombocytopenia (HCC) - stable 287.5 D69.6   6. Gout, unspecified - stable 274.9 M10.9   7. Alcohol abuse  305.00 F10.10   8. History of stroke - stable V12.54 Z86.73   9. HYPERTENSION, BENIGN SYSTEMIC - stable 401.1 I10   10. Hyperlipidemia - stable 272.4 E78.5     Check CBC w diif and BMP on 07/30/15   Cont other meds as ordered  Wound care as directed to right ankle  PT/OT/ST as indicated  TF as ordered  Peg tube care as ordered  Cont nutritional supplement as ordered  GOAL: short term rehab and d/c home when medically appropriate. Communicated with pt and nursing.  Will follow  Leatrice Parilla S. Perlie Gold  [facility] [address] [place], [place] [address] [phone] Cell (Monday-Friday 8 AM - 5 PM) [phone] After 5 PM and follow prompts

## 2015-07-31 LAB — BENZODIAZEPINES,MS,WB/SP RFX
7-Aminoclonazepam: NEGATIVE ng/mL
Alprazolam: NEGATIVE ng/mL
BENZODIAZEPINES CONFIRM: POSITIVE
CHLORDIAZEPOXIDE: NEGATIVE ng/mL
Clonazepam: NEGATIVE ng/mL
DESMETHYLCHLORDIAZEPOXIDE: NEGATIVE ng/mL
DESMETHYLDIAZEPAM: NEGATIVE ng/mL
DIAZEPAM: NEGATIVE ng/mL
Desalkylflurazepam: NEGATIVE ng/mL
Flurazepam: NEGATIVE ng/mL
LORAZEPAM: 26.7 ng/mL
MIDAZOLAM: NEGATIVE ng/mL
OXAZEPAM: NEGATIVE ng/mL
Temazepam: NEGATIVE ng/mL
Triazolam: NEGATIVE ng/mL

## 2015-08-04 LAB — THC,MS,WB/SP RFX
CANNABINOID CONFIRMATION: POSITIVE
Cannabidiol: NEGATIVE ng/mL
Cannabinol: NEGATIVE ng/mL
Carboxy-THC: 1 ng/mL
HYDROXY-THC: NEGATIVE ng/mL
Tetrahydrocannabinol(THC): NEGATIVE ng/mL

## 2015-08-14 ENCOUNTER — Non-Acute Institutional Stay (SKILLED_NURSING_FACILITY): Payer: Medicare Other | Admitting: Internal Medicine

## 2015-08-14 ENCOUNTER — Encounter: Payer: Self-pay | Admitting: Internal Medicine

## 2015-08-14 DIAGNOSIS — K9429 Other complications of gastrostomy: Secondary | ICD-10-CM | POA: Diagnosis not present

## 2015-08-14 DIAGNOSIS — G931 Anoxic brain damage, not elsewhere classified: Secondary | ICD-10-CM

## 2015-08-14 NOTE — Progress Notes (Signed)
Patient ID: Mitchell [name], male   DOB: [birth year], 64 y.o.   MRN: [id number]    DATE: 08/14/15  Location:  [facility]    Place of Service: SNF (31)   Extended Emergency Contact Information Primary Emergency Contact: Gillison,Shirley Address: [address]          [place] [address] [place] Phone: [phone] Relation: Spouse  Advanced Directive information FULL CODE  Chief Complaint  Patient presents with  . Acute Visit    HPI:  64 yo male seen today for Peg site d/c x 2-3 days. Initially irritated and TTP at site and had green d/c but improving since dressing changed BID. No f/c. He is eating po with no problem. Peg is clamped.he has had intermmedte emesis.  Wife present. Pt is a poor historian due to anoxic brain injury. Hx obtained from wife  Past Medical History  Diagnosis Date  . Hypertension   . Alcohol abuse   . Gout   . Hyperlipemia     Past Surgical History  Procedure Laterality Date  . Radiology with anesthesia N/A 07/17/2015    Procedure: RADIOLOGY WITH ANESTHESIA;  Surgeon: Medication Radiologist, MD;  Location: [facility];  Service: Radiology;  Laterality: N/A;    No care team member to display  Social History   Social History  . Marital Status: Married    Spouse Name: N/A  . Number of Children: N/A  . Years of Education: N/A   Occupational History  . Not on file.   Social History Main Topics  . Smoking status: Current Some Day Smoker    Types: Cigars  . Smokeless tobacco: Not on file  . Alcohol Use: Not on file  . Drug Use: Not on file  . Sexual Activity: Not on file   Other Topics Concern  . Not on file   Social History Narrative     reports that he has been smoking Cigars.  He does not have any smokeless tobacco history on file. His alcohol and drug histories are not on file.  Immunization History  Administered Date(s) Administered  . Pneumococcal Polysaccharide-23 07/11/2015  . Td 07/28/1997     Allergies  Allergen Reactions  . Hydrochlorothiazide     REACTION: precipitates gout  . Pseudoephedrine     REACTION: rash    Medications: Patient's Medications  New Prescriptions   No medications on file  Previous Medications   AMINO ACIDS-PROTEIN HYDROLYS (FEEDING SUPPLEMENT, PRO-STAT SUGAR FREE 64,) LIQD    Place 30 mLs into feeding tube 2 (two) times daily.   ASPIRIN EC 81 MG EC TABLET    Take 1 tablet (81 mg total) by mouth daily.   FOLIC ACID (FOLVITE) 1 MG TABLET    Take 1 tablet (1 mg total) by mouth daily.   FUROSEMIDE (LASIX) 20 MG TABLET    Take 1 tablet (20 mg total) by mouth daily.   METOPROLOL (LOPRESSOR) 50 MG TABLET    Place 1 tablet (50 mg total) into feeding tube 2 (two) times daily.   MULTIVITAMIN (PROSIGHT) TABS TABLET    Take 1 tablet by mouth daily.   NUTRITIONAL SUPPLEMENTS (FEEDING SUPPLEMENT, JEVITY 1.2 CAL,) LIQD    Place 1,000 mLs into feeding tube continuous.   PANTOPRAZOLE (PROTONIX) 40 MG TABLET    Take 1 tablet (40 mg total) by mouth daily.   POTASSIUM CHLORIDE (KLOR-CON M15) 15 MEQ TABLET    Take 2 tablets (30 mEq total) by mouth daily.  RIVAROXABAN (XARELTO) 15 MG TABS TABLET    Take 1 tablet (15 mg total) by mouth 2 (two) times daily with a meal.   RIVAROXABAN (XARELTO) 20 MG TABS TABLET    Take 1 tablet (20 mg total) by mouth daily with supper.   THIAMINE 100 MG TABLET    Take 1 tablet (100 mg total) by mouth daily.  Modified Medications   No medications on file  Discontinued Medications   No medications on file    Review of Systems  Unable to perform ROS: Other  anoxic brain injury  Filed Vitals:   08/14/15 1353  BP: 126/74  Pulse: 53  Temp: 97.7 F (36.5 C)  TempSrc: Oral  Resp: 16  Weight: 162 lb 6.4 oz (73.664 kg)   Body mass index is 24.7 kg/(m^2).  Physical Exam  Constitutional: He appears well-developed. No distress.  Frail appearing in NAD. Sitting on bed. Wife present  Abdominal: He exhibits no distension and no  mass. There is no tenderness. There is no rebound and no guarding.  Peg intact with 4x4 intact. Min yellow d/c at insertion site. No redness. NT. No foul odor. Peg is taped in place with paper tape.   Neurological: He is alert.  Skin: Skin is warm and dry. Rash noted.  Psychiatric: He has a normal mood and affect. His behavior is normal.     Labs reviewed: Admission on 07/19/2015, Discharged on 07/27/2015  No results displayed because visit has over 200 results.  CBC Latest Ref Rng 07/27/2015 07/26/2015 07/25/2015  WBC 4.0 - 10.5 K/uL 10.9(H) 9.9 16.1(H)  Hemoglobin 13.0 - 17.0 g/dL 8.7(L) 8.4(L) 10.1(L)  Hematocrit 39.0 - 52.0 % 27.8(L) 25.6(L) 31.3(L)  Platelets 150 - 400 K/uL 317 298 429(H)   CMP Latest Ref Rng 07/27/2015 07/26/2015 07/24/2015  Glucose 65 - 99 mg/dL 106(H) 134(H) 124(H)  BUN 6 - 20 mg/dL 37(H) 41(H) 44(H)  Creatinine 0.61 - 1.24 mg/dL 1.58(H) 1.61(H) 1.38(H)  Sodium 135 - 145 mmol/L 143 147(H) 149(H)  Potassium 3.5 - 5.1 mmol/L 3.5 3.6 3.7  Chloride 101 - 111 mmol/L 108 112(H) 111  CO2 22 - 32 mmol/L 25 28 28   Calcium 8.9 - 10.3 mg/dL 8.1(L) 8.3(L) 8.4(L)  Total Protein 6.5 - 8.1 g/dL 5.9(L) 5.8(L) 5.9(L)  Total Bilirubin 0.3 - 1.2 mg/dL 0.6 0.6 0.6  Alkaline Phos 38 - 126 U/L 82 85 88  AST 15 - 41 U/L 39 39 54(H)  ALT 17 - 63 U/L 35 35 28       Admission on 07/08/2015, Discharged on 07/19/2015  No results displayed because visit has over 200 results.      Mr Brain Wo Contrast  07/17/2015  CLINICAL DATA:  Mental status change. Recent cardiac arrest with ventricular fibrillation. Question the knocked brain injury versus CVA. EXAM: MRI HEAD WITHOUT CONTRAST TECHNIQUE: Multiplanar, multiecho pulse sequences of the brain and surrounding structures were obtained without intravenous contrast. COMPARISON:  CT head without contrast 07/08/2015 FINDINGS: A linear subcortical white matter infarct in the left frontal lobe measures 14 mm in cephalo caudad dimension.  There is no acute hemorrhage or mass lesion. Minimal supratentorial T2 changes are within normal limits for age. There is some white matter change within the brainstem. The internal auditory canals are within normal limits. Flow is present in the major intracranial arteries. Globes and orbits are intact. Fluid is present in the nasopharynx. Mild mucosal thickening is present in the maxillary sinuses and ethmoid air cells bilaterally. There is  some fluid in the mastoid air cells bilaterally. No obstructing nasopharyngeal lesion is present. The ventricles are of normal size. No significant extra-axial fluid collection is present. IMPRESSION: 1. Acute/subacute linear infarct in the subcortical white matter of the left frontal lobe. 2. No other significant supratentorial white matter disease. 3. Minimal T2 changes within the brainstem suggesting microvascular disease. 4. Fluid layering in the in nasopharynx and mild sinus disease as described. This is likely related to the patient's hospitalization and previous intubation. Patient was extubated 4 days ago. Electronically Signed   By: San Morelle M.D.   On: 07/17/2015 16:47   Ir Gastrostomy Tube Mod Sed  07/17/2015  CLINICAL DATA:  64 year old male with a history of dysphagia. History of cerebral vascular accident. He has been referred for placement of percutaneous gastrostomy tube. EXAM: PERCUTANEOUS GASTROSTOMY FLUOROSCOPY TIME:  1 minutes 30 seconds MEDICATIONS AND MEDICAL HISTORY: Versed 1.0 mg, Fentanyl 50 mcg. ANESTHESIA/SEDATION: Moderate sedation time: 10 minutes CONTRAST:  10 cc enteric through the tube PROCEDURE: The procedure, risks, benefits, and alternatives were explained to the patient's family. Questions regarding the procedure were encouraged and answered. The patient understands and consents to the procedure. The epigastrium was prepped with Betadine in a sterile fashion, and a sterile drape was applied covering the operative field. A  sterile gown and sterile gloves were used for the procedure. A 5-French orogastric tube is placed under fluoroscopic guidance. Scout imaging of the abdomen confirms barium within the transverse colon. The stomach was distended with gas. Under fluoroscopic guidance, an 18 gauge needle was utilized to puncture the anterior wall of the body of the stomach. An Amplatz wire was advanced through the needle passing a T fastener into the lumen of the stomach. The T fastener was secured for gastropexy. A 9-French sheath was inserted. A snare was advanced through the 9-French sheath. A Britta Mccreedy was advanced through the orogastric tube. It was snared then pulled out the oral cavity, pulling the snare, as well. The leading edge of the gastrostomy was attached to the snare. It was then pulled down the esophagus and out the percutaneous site. It was secured in place. Contrast was injected. No complication. COMPLICATIONS: None FINDINGS: The image demonstrates placement of a 20-French pull-through type gastrostomy tube into the body of the stomach. IMPRESSION: Status post placement of 20 French pull-through percutaneous gastrostomy tube. Signed, Dulcy Fanny. Earleen Newport, DO [facility] Electronically Signed   By: Corrie Mckusick D.O.   On: 07/17/2015 16:30   Dg Chest Port 1 View  07/26/2015  CLINICAL DATA:  Pulmonary edema. EXAM: PORTABLE CHEST 1 VIEW COMPARISON:  07/25/2015 FINDINGS: There is further improvement and edema pattern which has essentially resolved. There is no evidence of airspace consolidation, pneumothorax, nodule or pleural fluid. The heart size is normal. IMPRESSION: Resolution of pulmonary edema. Electronically Signed   By: Aletta Edouard M.D.   On: 07/26/2015 09:17   Dg Chest Port 1 View  07/25/2015  CLINICAL DATA:  Pulmonary edema EXAM: PORTABLE CHEST 1 VIEW COMPARISON:  07/22/2015 FINDINGS: Borderline cardiomegaly again noted. Persistent central mild  vascular congestion and mild central pulmonary edema. No segmental infiltrate. No pneumothorax. IMPRESSION: Persistent central mild vascular congestion and mild central pulmonary edema. No segmental infiltrate. No pneumothorax. Electronically Signed   By: Lahoma Crocker M.D.   On: 07/25/2015 08:16   Dg Chest Port 1 View  07/22/2015  CLINICAL DATA:  The EXAM: PORTABLE CHEST 1 VIEW COMPARISON:  July 21, 2015 FINDINGS:  Perihilar alveolar opacity is again noted. No new opacity is seen. Heart is upper normal in size with pulmonary vascularity showing evidence of pulmonary venous hypertension. There is left lower lobe atelectatic change. No adenopathy appreciable. IMPRESSION: Perihilar alveolar opacity, likely edema. There is evidence of pulmonary venous hypertension. These findings are felt to be indicative of a degree of congestive heart failure. No new opacity. There is atelectatic change in the left base, stable. Electronically Signed   By: Lowella Grip III M.D.   On: 07/22/2015 08:06   Dg Chest Port 1 View  07/21/2015  CLINICAL DATA:  Pulmonary edema EXAM: CHEST 1 VIEW COMPARISON:  Yesterday FINDINGS: Study dictated in down time. Symmetric perihilar airspace disease is unchanged. No evidence of increasing pleural fluid. Stable normal heart size. Stable upper mediastinal width. No pneumothorax. IMPRESSION: Unchanged alveolar edema pattern. There may be superimposed aspiration or pneumonia in this patient with fever. Electronically Signed   By: Monte Fantasia M.D.   On: 07/21/2015 07:42   Dg Chest Port 1 View  07/20/2015  CLINICAL DATA:  Fever, acute respiratory distress, shortness of breath EXAM: PORTABLE CHEST 1 VIEW COMPARISON:  None. FINDINGS: Multifocal patchy/ perihilar opacities bilaterally, increased. This appearance suggests moderate interstitial edema. Multifocal pneumonia (possibly on the basis of aspiration) is also possible. No definite pleural effusion.  No pneumothorax Cardiomegaly.  IMPRESSION: Multifocal patchy/perihilar opacities bilaterally, increased, suggesting moderate interstitial edema. Multifocal pneumonia (possibly on the basis of aspiration) is also possible. Electronically Signed   By: Julian Hy M.D.   On: 07/20/2015 14:42   Dg Chest Port 1 View  07/19/2015  CLINICAL DATA:  Respiratory distress, chest pain EXAM: PORTABLE CHEST 1 VIEW COMPARISON:  06/28/2015 FINDINGS: Cardiomegaly with perihilar edema. No definite pleural effusions. No pneumothorax. Interval removal of left IJ venous catheter. IMPRESSION: Cardiomegaly with perihilar edema. Electronically Signed   By: Julian Hy M.D.   On: 07/19/2015 17:38   Dg Chest Port 1 View  07/18/2015  CLINICAL DATA:  Hypoxia. EXAM: PORTABLE CHEST 1 VIEW COMPARISON:  07/13/2015 FINDINGS: Left internal jugular central venous catheter tip in the proximal SVC. Cardiomediastinal contours are unchanged. Persistent bilateral perihilar opacities suspicious for pulmonary edema, with mild improvement from the left and unchanged appearance in the right allowing for differences in technique. Question layering pleural effusions, with retrocardiac opacity. No pneumothorax. Overlying artifacts project over the upper hemithorax. IMPRESSION: Minimal improved aeration of the left perihilar region, with persistent perihilar opacities suspicious for pulmonary edema. Probable pleural effusions, retrocardiac opacity likely effusion and atelectasis. Electronically Signed   By: Jeb Levering M.D.   On: 07/18/2015 02:29   Dg Swallowing Func-speech Pathology  07/18/2015  Objective Swallowing Evaluation:   Patient Details Name: Kenyada Homeier MRN: [id number] Date of Birth: January 21, 1951 Today's Date: 07/18/2015 Time: SLP Start Time (ACUTE ONLY): 1145-SLP Stop Time (ACUTE ONLY): 1210 SLP Time Calculation (min) (ACUTE ONLY): 25 min Past Medical History: Past Medical History Diagnosis Date . Hypertension  . Alcohol abuse  . Gout  . Hyperlipemia  Past  Surgical History: No past surgical history on file. HPI: Other Pertinent Information: Pt is a 64 yo AAM with incomplete pmh but known HTN, Gout, Hyperlipidemia, ETOH abuse who was noted to have respiratory distress and odd resp sounds per wife 1 min after laying back down early am 10/11 and EMS was activated. Wife attempted cpr at home. He was down for approximately 10 minutes AND when EMS he was in V fib. Shocked x 1 and epi x1 with  return of circulation. Intubated and transported to [facility] ED. He was taken for CT of chest and head to rule out PE and or Neurological injury. Pt with possible tracheal/laryngeal/esophageal/pharyngeal injury from emergent intubation. Intubated 10/11-10/14. ENT perfomed laryngoscopy however unable to visualize larynx due to copious secretions.  No Data Recorded Assessment / Plan / Recommendation CHL IP CLINICAL IMPRESSIONS 07/18/2015 Therapy Diagnosis Mild oral phase dysphagia;Mild pharyngeal phase dysphagia Clinical Impression Pt presents with a mild oral dysphagia with anterior mastication and slow bolus formation and transit with solids. Oropharyngeal phase also mildy impaired with a mild delay in swallow iniatition with resuting trace penetration before/during the swallow with large consecutive sips. Pt also with mild weakness of the hyolaryngeal mechanism with mild residuals post swallow. Chin tuck does not aid in transit, though cued effortful swallow shows potential for improvement. Recommend pt initiate a dys 3 (mechanical soft) diet with thin liquids, being careful to take one single sip at a time, avoid straws and take pills whole in puree. Pt was able to repeat these precautions and return demonstrate independently with water after session complete. SLP will f/u for tolerance.    CHL IP TREATMENT RECOMMENDATION 07/18/2015 Treatment Recommendations Therapy as outlined in treatment plan below   CHL IP DIET RECOMMENDATION 07/18/2015 SLP Diet Recommendations Dysphagia 3 (Mech  soft);Thin Liquid Administration via (None) Medication Administration Whole meds with puree Compensations Slow rate;Small sips/bites Postural Changes and/or Swallow Maneuvers (None)   CHL IP OTHER RECOMMENDATIONS 07/18/2015 Recommended Consults (None) Oral Care Recommendations Oral care BID Other Recommendations (None)   No flowsheet data found.  CHL IP FREQUENCY AND DURATION 07/18/2015 Speech Therapy Frequency (ACUTE ONLY) min 2x/week Treatment Duration 2 weeks   Pertinent Vitals/Pain NA  SLP Swallow Goals No flowsheet data found. No flowsheet data found.   CHL IP REASON FOR REFERRAL 07/18/2015 Reason for Referral Objectively evaluate swallowing function         Herbie Baltimore, MA CCC-SLP [id number] Lynann Beaver 07/18/2015, 1:56 PM     Assessment/Plan   ICD-9-CM ICD-10-CM   1. Irritation around percutaneous endoscopic gastrostomy (PEG) tube site [facility])  - Does not appear infected  536.49 K94.29   2. Anoxic brain injury ([place]) - improving 348.1 G93.1     Avoid using tape to anchor Peg tube  Use abdominal band for support of Peg tube  Change Peg bandage BID and keep dry to prevent infection  Cont current meds as ordered   T/c abx if infection occurs  T/c Peg removal if he maintains po intake without aspiration  Will follow  Margret Moat S. Perlie Gold  [facility] [address] [place], [place] [address] [phone] Cell (Monday-Friday 8 AM - 5 PM) [phone] After 5 PM and follow prompts

## 2015-08-20 ENCOUNTER — Non-Acute Institutional Stay (SKILLED_NURSING_FACILITY): Payer: Medicare Other | Admitting: Nurse Practitioner

## 2015-08-20 DIAGNOSIS — K9429 Other complications of gastrostomy: Secondary | ICD-10-CM

## 2015-08-20 DIAGNOSIS — R1310 Dysphagia, unspecified: Secondary | ICD-10-CM

## 2015-08-20 DIAGNOSIS — I5042 Chronic combined systolic (congestive) and diastolic (congestive) heart failure: Secondary | ICD-10-CM

## 2015-08-20 DIAGNOSIS — J810 Acute pulmonary edema: Secondary | ICD-10-CM

## 2015-08-20 DIAGNOSIS — G931 Anoxic brain damage, not elsewhere classified: Secondary | ICD-10-CM

## 2015-08-20 DIAGNOSIS — F1010 Alcohol abuse, uncomplicated: Secondary | ICD-10-CM

## 2015-08-20 DIAGNOSIS — I10 Essential (primary) hypertension: Secondary | ICD-10-CM

## 2015-08-20 DIAGNOSIS — D62 Acute posthemorrhagic anemia: Secondary | ICD-10-CM

## 2015-08-20 NOTE — Progress Notes (Signed)
Patient ID: Mitchell [name], male   DOB: [birth year], 64 y.o.   MRN: [id number]    Nursing Home Location:  [place] of Service: SNF (31)  PCP: No primary care provider on file.  Allergies  Allergen Reactions  . Hydrochlorothiazide     REACTION: precipitates gout  . Pseudoephedrine     REACTION: rash    Chief Complaint  Patient presents with  . Medical Management of Chronic Issues    HPI:  Patient is a 64 y.o. male seen today at [facility] for discharge home. 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  Pt without complaints today and ready for discharge home with wife.  Patient currently doing well with therapy, now stable to discharge home with home health.  Review of Systems:  Review of Systems  Constitutional: Negative for activity change, appetite change, fatigue and unexpected weight change.  HENT: Negative for congestion and hearing loss.   Eyes: Negative.   Respiratory: Negative for cough and shortness of breath.   Cardiovascular: Negative for chest pain, palpitations and leg swelling.  Gastrointestinal: Negative for abdominal pain, diarrhea and constipation.  Genitourinary: Negative for dysuria and difficulty urinating.  Musculoskeletal: Negative for myalgias and arthralgias.  Skin: Negative for color change and wound.  Neurological: Negative for dizziness and weakness.  Psychiatric/Behavioral: Negative for behavioral problems, confusion and agitation.    Past Medical History  Diagnosis Date  . Hypertension   . Alcohol abuse   . Gout   . Hyperlipemia    Past Surgical History  Procedure Laterality Date  . Radiology with anesthesia N/A 07/17/2015    Procedure: RADIOLOGY  WITH ANESTHESIA;  Surgeon: Medication Radiologist, MD;  Location: [place];  Service: Radiology;  Laterality: N/A;   Social History:   reports that he has been smoking Cigars.  He does not have any smokeless tobacco history on file. His alcohol and drug histories are not on file.  No family history on file.  Medications: Patient's Medications  New Prescriptions   No medications on file  Previous Medications   AMINO ACIDS-PROTEIN HYDROLYS (FEEDING SUPPLEMENT, PRO-STAT SUGAR FREE 64,) LIQD    Place 30 mLs into feeding tube 2 (two) times daily.   ASPIRIN EC 81 MG EC TABLET    Take 1 tablet (81 mg total) by mouth daily.   FOLIC ACID (FOLVITE) 1 MG TABLET    Take 1 tablet (1 mg total) by mouth daily.   FUROSEMIDE (LASIX) 20 MG TABLET    Take 1 tablet (20 mg total) by mouth daily.   METOPROLOL (LOPRESSOR) 50 MG TABLET    Place 1 tablet (50 mg total) into feeding tube 2 (two) times daily.   MULTIVITAMIN (PROSIGHT) TABS TABLET    Take 1 tablet by mouth daily.   PANTOPRAZOLE (PROTONIX) 40 MG TABLET    Take 1 tablet (40 mg total) by mouth daily.   POTASSIUM CHLORIDE (KLOR-CON M15) 15 MEQ TABLET    Take 2 tablets (30 mEq total) by mouth daily.   RIVAROXABAN (XARELTO) 20 MG TABS TABLET    Take 1 tablet (20 mg total) by mouth daily with supper.   THIAMINE 100 MG TABLET    Take 1 tablet (100 mg total)  by mouth daily.  Modified Medications   No medications on file  Discontinued Medications   NUTRITIONAL SUPPLEMENTS (FEEDING SUPPLEMENT, JEVITY 1.2 CAL,) LIQD    Place 1,000 mLs into feeding tube continuous.   RIVAROXABAN (XARELTO) 15 MG TABS TABLET    Take 1 tablet (15 mg total) by mouth 2 (two) times daily with a meal.     Physical Exam: Filed Vitals:   08/20/15 1117  BP: 140/70  Pulse: 69  Temp: 97.1 F (36.2 C)  Resp: 20    Physical Exam  Constitutional: He is oriented to person, place, and time. No distress.  HENT:  Head: Normocephalic and atraumatic.  Mouth/Throat: Oropharynx is clear  and moist. No oropharyngeal exudate.  Eyes: Conjunctivae and EOM are normal. Pupils are equal, round, and reactive to light.  Neck: Normal range of motion. Neck supple.  Cardiovascular: Normal rate, regular rhythm and normal heart sounds.   Pulmonary/Chest: Effort normal and breath sounds normal.  Abdominal: Soft. Bowel sounds are normal.  PEG tube intact, no irritation noted  Musculoskeletal: He exhibits no edema or tenderness.  Neurological: He is alert and oriented to person, place, and time.  Skin: Skin is warm and dry. He is not diaphoretic.  Psychiatric: He has a normal mood and affect.    Labs reviewed: Basic Metabolic Panel:  Recent Labs  07/10/15 0346  07/15/15 0410  07/19/15 2122  07/21/15 [id number] 07/22/15 0319 07/23/15 0328 07/24/15 0247 07/26/15 0444 07/27/15 0225  NA 143  < > 146*  < >  --   < > 143 144  --  149* 147* 143  K 4.4  < > 3.7  < >  --   < > 4.1 3.9  --  3.7 3.6 3.5  CL 112*  < > 111  < >  --   < > 113* 111  --  111 112* 108  CO2 17*  < > 24  < >  --   < > 22 22  --  28 28 25   GLUCOSE 81  < > 91  < >  --   < > 120* 123*  --  124* 134* 106*  BUN 10  < > 22*  < >  --   < > 39* 45*  --  44* 41* 37*  CREATININE 2.10*  < > 1.99*  < >  --   < > 1.82* 1.72*  --  1.38* 1.61* 1.58*  CALCIUM 7.4*  < > 8.7*  < >  --   < > 8.4* 8.5*  --  8.4* 8.3* 8.1*  MG 2.3  < > 1.8  < > 1.8  < > 1.9 2.0 1.9  --   --   --   PHOS 3.9  --  3.4  --  3.0  --   --   --   --   --   --   --   < > = values in this interval not displayed. Liver Function Tests:  Recent Labs  07/24/15 0247 07/26/15 0444 07/27/15 0225  AST 54* 39 39  ALT 28 35 35  ALKPHOS 88 85 82  BILITOT 0.6 0.6 0.6  PROT 5.9* 5.8* 5.9*  ALBUMIN 2.2* 2.1* 2.2*   No results for input(s): LIPASE, AMYLASE in the last 8760 hours. No results for input(s): AMMONIA in the last 8760 hours. CBC:  Recent Labs  07/20/15 0359 07/20/15 1630 07/21/15 0314  07/25/15 0252 07/26/15 0444 07/27/15 0225  WBC 11.7* 10.7*  9.1  < > 16.1* 9.9 10.9*  NEUTROABS 10.1* 9.8* 7.1  --   --   --   --   HGB 7.7* 8.3* 7.9*  < > 10.1* 8.4* 8.7*  HCT 22.8* 23.5* 23.3*  < > 31.3* 25.6* 27.8*  MCV 88.0 86.7 88.9  < > 92.1 93.8 93.0  PLT 229 275 271  < > 429* 298 317  < > = values in this interval not displayed. TSH: No results for input(s): TSH in the last 8760 hours. A1C: No results found for: HGBA1C Lipid Panel:  Recent Labs  07/10/15 0346 07/11/15 0337 07/12/15 0415  TRIG 189* 68 61    07/30/15 TEST FLAG RESULT REF RANGE UNIT Bicarbonate (CO2) 22 21-31 mmol/L BUN (Urea Nitrogen) H 26 7-25 mg/dL BUN/Cr Ratio 19 11-19 mg/dL Calcium L 8.2 8.6-10.3 mg/dL Chloride H 109 98-107 mmol/L Creatinine, Serum H 1.4 0.6-1.3 mg/dL Glucose H 103 65-99 mg/dL Fasting: 65-99 mg/d Non-fasting: 65-125 mg/dL Potassium 4.4 3.5-5.1 mmol/L Sodium 138 136-145 mmol/L TEST FLAG RESULT REF RANGE UNIT WBC w/diff 10.5 4.5-10.8 10*3/L RBC L 2.75 4.00-6.60 10*6/L Hematocrit L 25.5 45.0-54.0 % Hemoglobin L 8.4 14.0-18.0 g/dL MCV 92.8 80.0-100.0 fL MCH 30.3 26.0-35.0 pg MCHC 32.7 31.0-36.5 g/dL RDW H 16.7 11.0-16.0 % Platelet Count 185.0 150.0-450.0 10*3/L MPV 11.8 6.5-12.0 fL Neutrophil % 77.0 40.0-80.0 % Lymphocyte % L 12.1 13.0-48.0 % Monocyte % 8.0 2.0-12.0 % Eosinophil % 2.1 0.0-8.0 % Basophil % 0.8 0.0-2.0 % Neutrophil # H 8.1 1.5-7.6 10*3/L Lymphocyte # 1.3 0.9-5.5 10*3/L Monocyte # 0.8 0.1-1.1 10*3/L Eosinophil # 0.2 0.2-0.8 10*3/L Basophil # 0.1 0.0-0.3 10*3/L  Radiological Exams: Dg Chest Port 1 View  07/20/2015  CLINICAL DATA:  Fever, acute respiratory distress, shortness of breath EXAM: PORTABLE CHEST 1 VIEW COMPARISON:  None. FINDINGS: Multifocal patchy/ perihilar opacities bilaterally, increased. This appearance suggests moderate interstitial edema. Multifocal pneumonia (possibly on the basis of aspiration) is also possible. No definite pleural effusion.  No pneumothorax Cardiomegaly. IMPRESSION:  Multifocal patchy/perihilar opacities bilaterally, increased, suggesting moderate interstitial edema. Multifocal pneumonia (possibly on the basis of aspiration) is also possible. Electronically Signed   By: Julian Hy M.D.   On: 07/20/2015 14:42   Dg Chest Port 1 View  07/19/2015  CLINICAL DATA:  Respiratory distress, chest pain EXAM: PORTABLE CHEST 1 VIEW COMPARISON:  06/28/2015 FINDINGS: Cardiomegaly with perihilar edema. No definite pleural effusions. No pneumothorax. Interval removal of left IJ venous catheter. IMPRESSION: Cardiomegaly with perihilar edema. Electronically Signed   By: Julian Hy M.D.   On: 07/19/2015 17:38    Assessment/Plan 1. Chronic combined systolic and diastolic CHF (congestive heart failure) (HCC) Euvolemic, conts on lasix and metorpolol   2. Acute pulmonary edema (HCC) Resolved, conts on lasix and potassium supplement   3. Irritation around percutaneous endoscopic gastrostomy (PEG) tube site (HCC) Resolved, cont PEG care.   4. Anoxic brain injury ([place]) -Brain MRI noted acute/subacute CVA during hospitalization Patient has made a dramatic improvement in regard to his mental status since event  5. HYPERTENSION, BENIGN SYSTEMIC -blood pressure stable, cont current medication  6. Alcohol abuse Stable, conts on supplement folic acid and thiamine   7. Dysphagia Tolerating diet without signs of aspiration, will have ST per [facility] for ongoing evaluation and treatment  8. Anemia  -hgb stable at this time, will need further evaluation as outpatient.   pt is stable for discharge-will need PT/OT/Nursing/ST per home health. No DME needed. Rx written.  will need to follow up with PCP within  2 weeks.     Carlos American. Harle Battiest  [facility] [phone] 8 am - 5 pm) [phone] (after hours)

## 2015-08-23 DIAGNOSIS — M109 Gout, unspecified: Secondary | ICD-10-CM | POA: Diagnosis not present

## 2015-08-23 DIAGNOSIS — E785 Hyperlipidemia, unspecified: Secondary | ICD-10-CM | POA: Diagnosis not present

## 2015-08-23 DIAGNOSIS — D696 Thrombocytopenia, unspecified: Secondary | ICD-10-CM | POA: Diagnosis not present

## 2015-08-23 DIAGNOSIS — I110 Hypertensive heart disease with heart failure: Secondary | ICD-10-CM | POA: Diagnosis not present

## 2015-08-23 DIAGNOSIS — I5042 Chronic combined systolic (congestive) and diastolic (congestive) heart failure: Secondary | ICD-10-CM | POA: Diagnosis not present

## 2015-08-23 DIAGNOSIS — I825Z3 Chronic embolism and thrombosis of unspecified deep veins of distal lower extremity, bilateral: Secondary | ICD-10-CM | POA: Diagnosis not present

## 2015-08-26 DIAGNOSIS — I110 Hypertensive heart disease with heart failure: Secondary | ICD-10-CM | POA: Diagnosis not present

## 2015-08-26 DIAGNOSIS — I825Z3 Chronic embolism and thrombosis of unspecified deep veins of distal lower extremity, bilateral: Secondary | ICD-10-CM | POA: Diagnosis not present

## 2015-08-26 DIAGNOSIS — I5042 Chronic combined systolic (congestive) and diastolic (congestive) heart failure: Secondary | ICD-10-CM | POA: Diagnosis not present

## 2015-08-26 DIAGNOSIS — E785 Hyperlipidemia, unspecified: Secondary | ICD-10-CM | POA: Diagnosis not present

## 2015-08-26 DIAGNOSIS — D696 Thrombocytopenia, unspecified: Secondary | ICD-10-CM | POA: Diagnosis not present

## 2015-08-26 DIAGNOSIS — M109 Gout, unspecified: Secondary | ICD-10-CM | POA: Diagnosis not present

## 2015-08-27 DIAGNOSIS — D696 Thrombocytopenia, unspecified: Secondary | ICD-10-CM | POA: Diagnosis not present

## 2015-08-27 DIAGNOSIS — I110 Hypertensive heart disease with heart failure: Secondary | ICD-10-CM | POA: Diagnosis not present

## 2015-08-27 DIAGNOSIS — E785 Hyperlipidemia, unspecified: Secondary | ICD-10-CM | POA: Diagnosis not present

## 2015-08-27 DIAGNOSIS — I5042 Chronic combined systolic (congestive) and diastolic (congestive) heart failure: Secondary | ICD-10-CM | POA: Diagnosis not present

## 2015-08-27 DIAGNOSIS — I825Z3 Chronic embolism and thrombosis of unspecified deep veins of distal lower extremity, bilateral: Secondary | ICD-10-CM | POA: Diagnosis not present

## 2015-08-27 DIAGNOSIS — M109 Gout, unspecified: Secondary | ICD-10-CM | POA: Diagnosis not present

## 2015-08-28 DIAGNOSIS — D696 Thrombocytopenia, unspecified: Secondary | ICD-10-CM | POA: Diagnosis not present

## 2015-08-28 DIAGNOSIS — I5042 Chronic combined systolic (congestive) and diastolic (congestive) heart failure: Secondary | ICD-10-CM | POA: Diagnosis not present

## 2015-08-28 DIAGNOSIS — I110 Hypertensive heart disease with heart failure: Secondary | ICD-10-CM | POA: Diagnosis not present

## 2015-08-28 DIAGNOSIS — M109 Gout, unspecified: Secondary | ICD-10-CM | POA: Diagnosis not present

## 2015-08-28 DIAGNOSIS — I825Z3 Chronic embolism and thrombosis of unspecified deep veins of distal lower extremity, bilateral: Secondary | ICD-10-CM | POA: Diagnosis not present

## 2015-08-28 DIAGNOSIS — E785 Hyperlipidemia, unspecified: Secondary | ICD-10-CM | POA: Diagnosis not present

## 2015-09-01 DIAGNOSIS — I10 Essential (primary) hypertension: Secondary | ICD-10-CM | POA: Diagnosis not present

## 2015-09-01 DIAGNOSIS — Z7901 Long term (current) use of anticoagulants: Secondary | ICD-10-CM | POA: Diagnosis not present

## 2015-09-01 DIAGNOSIS — M1830 Unilateral post-traumatic osteoarthritis of first carpometacarpal joint, unspecified hand: Secondary | ICD-10-CM | POA: Diagnosis not present

## 2015-09-01 DIAGNOSIS — N183 Chronic kidney disease, stage 3 (moderate): Secondary | ICD-10-CM | POA: Diagnosis not present

## 2015-09-02 DIAGNOSIS — D696 Thrombocytopenia, unspecified: Secondary | ICD-10-CM | POA: Diagnosis not present

## 2015-09-02 DIAGNOSIS — I110 Hypertensive heart disease with heart failure: Secondary | ICD-10-CM | POA: Diagnosis not present

## 2015-09-02 DIAGNOSIS — I825Z3 Chronic embolism and thrombosis of unspecified deep veins of distal lower extremity, bilateral: Secondary | ICD-10-CM | POA: Diagnosis not present

## 2015-09-02 DIAGNOSIS — I5042 Chronic combined systolic (congestive) and diastolic (congestive) heart failure: Secondary | ICD-10-CM | POA: Diagnosis not present

## 2015-09-02 DIAGNOSIS — E785 Hyperlipidemia, unspecified: Secondary | ICD-10-CM | POA: Diagnosis not present

## 2015-09-02 DIAGNOSIS — M109 Gout, unspecified: Secondary | ICD-10-CM | POA: Diagnosis not present

## 2015-09-04 DIAGNOSIS — I110 Hypertensive heart disease with heart failure: Secondary | ICD-10-CM | POA: Diagnosis not present

## 2015-09-04 DIAGNOSIS — D696 Thrombocytopenia, unspecified: Secondary | ICD-10-CM | POA: Diagnosis not present

## 2015-09-04 DIAGNOSIS — E785 Hyperlipidemia, unspecified: Secondary | ICD-10-CM | POA: Diagnosis not present

## 2015-09-04 DIAGNOSIS — I825Z3 Chronic embolism and thrombosis of unspecified deep veins of distal lower extremity, bilateral: Secondary | ICD-10-CM | POA: Diagnosis not present

## 2015-09-04 DIAGNOSIS — I5042 Chronic combined systolic (congestive) and diastolic (congestive) heart failure: Secondary | ICD-10-CM | POA: Diagnosis not present

## 2015-09-04 DIAGNOSIS — M109 Gout, unspecified: Secondary | ICD-10-CM | POA: Diagnosis not present

## 2015-09-09 DIAGNOSIS — I5042 Chronic combined systolic (congestive) and diastolic (congestive) heart failure: Secondary | ICD-10-CM | POA: Diagnosis not present

## 2015-09-09 DIAGNOSIS — D696 Thrombocytopenia, unspecified: Secondary | ICD-10-CM | POA: Diagnosis not present

## 2015-09-09 DIAGNOSIS — I825Z3 Chronic embolism and thrombosis of unspecified deep veins of distal lower extremity, bilateral: Secondary | ICD-10-CM | POA: Diagnosis not present

## 2015-09-09 DIAGNOSIS — I110 Hypertensive heart disease with heart failure: Secondary | ICD-10-CM | POA: Diagnosis not present

## 2015-09-09 DIAGNOSIS — M109 Gout, unspecified: Secondary | ICD-10-CM | POA: Diagnosis not present

## 2015-09-09 DIAGNOSIS — E785 Hyperlipidemia, unspecified: Secondary | ICD-10-CM | POA: Diagnosis not present

## 2015-09-11 DIAGNOSIS — I5042 Chronic combined systolic (congestive) and diastolic (congestive) heart failure: Secondary | ICD-10-CM | POA: Diagnosis not present

## 2015-09-11 DIAGNOSIS — M109 Gout, unspecified: Secondary | ICD-10-CM | POA: Diagnosis not present

## 2015-09-11 DIAGNOSIS — I110 Hypertensive heart disease with heart failure: Secondary | ICD-10-CM | POA: Diagnosis not present

## 2015-09-11 DIAGNOSIS — E785 Hyperlipidemia, unspecified: Secondary | ICD-10-CM | POA: Diagnosis not present

## 2015-09-11 DIAGNOSIS — I825Z3 Chronic embolism and thrombosis of unspecified deep veins of distal lower extremity, bilateral: Secondary | ICD-10-CM | POA: Diagnosis not present

## 2015-09-11 DIAGNOSIS — D696 Thrombocytopenia, unspecified: Secondary | ICD-10-CM | POA: Diagnosis not present

## 2015-09-15 ENCOUNTER — Other Ambulatory Visit: Payer: Self-pay | Admitting: Internal Medicine

## 2015-09-16 DIAGNOSIS — I5042 Chronic combined systolic (congestive) and diastolic (congestive) heart failure: Secondary | ICD-10-CM | POA: Diagnosis not present

## 2015-09-16 DIAGNOSIS — E785 Hyperlipidemia, unspecified: Secondary | ICD-10-CM | POA: Diagnosis not present

## 2015-09-16 DIAGNOSIS — M109 Gout, unspecified: Secondary | ICD-10-CM | POA: Diagnosis not present

## 2015-09-16 DIAGNOSIS — D696 Thrombocytopenia, unspecified: Secondary | ICD-10-CM | POA: Diagnosis not present

## 2015-09-16 DIAGNOSIS — I825Z3 Chronic embolism and thrombosis of unspecified deep veins of distal lower extremity, bilateral: Secondary | ICD-10-CM | POA: Diagnosis not present

## 2015-09-16 DIAGNOSIS — I110 Hypertensive heart disease with heart failure: Secondary | ICD-10-CM | POA: Diagnosis not present

## 2015-09-17 DIAGNOSIS — M150 Primary generalized (osteo)arthritis: Secondary | ICD-10-CM | POA: Diagnosis not present

## 2015-09-17 DIAGNOSIS — N189 Chronic kidney disease, unspecified: Secondary | ICD-10-CM | POA: Diagnosis not present

## 2015-09-17 DIAGNOSIS — M5136 Other intervertebral disc degeneration, lumbar region: Secondary | ICD-10-CM | POA: Diagnosis not present

## 2015-09-17 DIAGNOSIS — M1A09X1 Idiopathic chronic gout, multiple sites, with tophus (tophi): Secondary | ICD-10-CM | POA: Diagnosis not present

## 2015-09-18 ENCOUNTER — Other Ambulatory Visit: Payer: Self-pay | Admitting: Internal Medicine

## 2015-09-18 DIAGNOSIS — I825Z3 Chronic embolism and thrombosis of unspecified deep veins of distal lower extremity, bilateral: Secondary | ICD-10-CM | POA: Diagnosis not present

## 2015-09-18 DIAGNOSIS — E785 Hyperlipidemia, unspecified: Secondary | ICD-10-CM | POA: Diagnosis not present

## 2015-09-18 DIAGNOSIS — M109 Gout, unspecified: Secondary | ICD-10-CM | POA: Diagnosis not present

## 2015-09-18 DIAGNOSIS — I110 Hypertensive heart disease with heart failure: Secondary | ICD-10-CM | POA: Diagnosis not present

## 2015-09-18 DIAGNOSIS — I5042 Chronic combined systolic (congestive) and diastolic (congestive) heart failure: Secondary | ICD-10-CM | POA: Diagnosis not present

## 2015-09-18 DIAGNOSIS — D696 Thrombocytopenia, unspecified: Secondary | ICD-10-CM | POA: Diagnosis not present

## 2015-09-25 DIAGNOSIS — I825Z3 Chronic embolism and thrombosis of unspecified deep veins of distal lower extremity, bilateral: Secondary | ICD-10-CM | POA: Diagnosis not present

## 2015-09-25 DIAGNOSIS — I5042 Chronic combined systolic (congestive) and diastolic (congestive) heart failure: Secondary | ICD-10-CM | POA: Diagnosis not present

## 2015-09-25 DIAGNOSIS — M109 Gout, unspecified: Secondary | ICD-10-CM | POA: Diagnosis not present

## 2015-09-25 DIAGNOSIS — E785 Hyperlipidemia, unspecified: Secondary | ICD-10-CM | POA: Diagnosis not present

## 2015-09-25 DIAGNOSIS — I110 Hypertensive heart disease with heart failure: Secondary | ICD-10-CM | POA: Diagnosis not present

## 2015-09-25 DIAGNOSIS — D696 Thrombocytopenia, unspecified: Secondary | ICD-10-CM | POA: Diagnosis not present

## 2015-09-30 DIAGNOSIS — I110 Hypertensive heart disease with heart failure: Secondary | ICD-10-CM | POA: Diagnosis not present

## 2015-09-30 DIAGNOSIS — I825Z3 Chronic embolism and thrombosis of unspecified deep veins of distal lower extremity, bilateral: Secondary | ICD-10-CM | POA: Diagnosis not present

## 2015-09-30 DIAGNOSIS — M109 Gout, unspecified: Secondary | ICD-10-CM | POA: Diagnosis not present

## 2015-09-30 DIAGNOSIS — E785 Hyperlipidemia, unspecified: Secondary | ICD-10-CM | POA: Diagnosis not present

## 2015-09-30 DIAGNOSIS — D696 Thrombocytopenia, unspecified: Secondary | ICD-10-CM | POA: Diagnosis not present

## 2015-09-30 DIAGNOSIS — I5042 Chronic combined systolic (congestive) and diastolic (congestive) heart failure: Secondary | ICD-10-CM | POA: Diagnosis not present

## 2015-10-08 DIAGNOSIS — N183 Chronic kidney disease, stage 3 (moderate): Secondary | ICD-10-CM | POA: Diagnosis not present

## 2015-10-08 DIAGNOSIS — I10 Essential (primary) hypertension: Secondary | ICD-10-CM | POA: Diagnosis not present

## 2015-10-09 DIAGNOSIS — I5042 Chronic combined systolic (congestive) and diastolic (congestive) heart failure: Secondary | ICD-10-CM | POA: Diagnosis not present

## 2015-10-09 DIAGNOSIS — I110 Hypertensive heart disease with heart failure: Secondary | ICD-10-CM | POA: Diagnosis not present

## 2015-10-09 DIAGNOSIS — M109 Gout, unspecified: Secondary | ICD-10-CM | POA: Diagnosis not present

## 2015-10-09 DIAGNOSIS — D696 Thrombocytopenia, unspecified: Secondary | ICD-10-CM | POA: Diagnosis not present

## 2015-10-09 DIAGNOSIS — E785 Hyperlipidemia, unspecified: Secondary | ICD-10-CM | POA: Diagnosis not present

## 2015-10-09 DIAGNOSIS — I825Z3 Chronic embolism and thrombosis of unspecified deep veins of distal lower extremity, bilateral: Secondary | ICD-10-CM | POA: Diagnosis not present

## 2015-10-15 DIAGNOSIS — M109 Gout, unspecified: Secondary | ICD-10-CM | POA: Diagnosis not present

## 2015-10-15 DIAGNOSIS — E785 Hyperlipidemia, unspecified: Secondary | ICD-10-CM | POA: Diagnosis not present

## 2015-10-15 DIAGNOSIS — I5042 Chronic combined systolic (congestive) and diastolic (congestive) heart failure: Secondary | ICD-10-CM | POA: Diagnosis not present

## 2015-10-15 DIAGNOSIS — I110 Hypertensive heart disease with heart failure: Secondary | ICD-10-CM | POA: Diagnosis not present

## 2015-10-15 DIAGNOSIS — I825Z3 Chronic embolism and thrombosis of unspecified deep veins of distal lower extremity, bilateral: Secondary | ICD-10-CM | POA: Diagnosis not present

## 2015-10-15 DIAGNOSIS — D696 Thrombocytopenia, unspecified: Secondary | ICD-10-CM | POA: Diagnosis not present

## 2015-10-22 DIAGNOSIS — M109 Gout, unspecified: Secondary | ICD-10-CM | POA: Diagnosis not present

## 2015-10-22 DIAGNOSIS — E785 Hyperlipidemia, unspecified: Secondary | ICD-10-CM | POA: Diagnosis not present

## 2015-10-22 DIAGNOSIS — Z9181 History of falling: Secondary | ICD-10-CM | POA: Diagnosis not present

## 2015-10-22 DIAGNOSIS — I825Z3 Chronic embolism and thrombosis of unspecified deep veins of distal lower extremity, bilateral: Secondary | ICD-10-CM | POA: Diagnosis not present

## 2015-10-22 DIAGNOSIS — I5042 Chronic combined systolic (congestive) and diastolic (congestive) heart failure: Secondary | ICD-10-CM | POA: Diagnosis not present

## 2015-10-22 DIAGNOSIS — I110 Hypertensive heart disease with heart failure: Secondary | ICD-10-CM | POA: Diagnosis not present

## 2015-10-28 DIAGNOSIS — I825Z3 Chronic embolism and thrombosis of unspecified deep veins of distal lower extremity, bilateral: Secondary | ICD-10-CM | POA: Diagnosis not present

## 2015-10-28 DIAGNOSIS — M109 Gout, unspecified: Secondary | ICD-10-CM | POA: Diagnosis not present

## 2015-10-28 DIAGNOSIS — I5042 Chronic combined systolic (congestive) and diastolic (congestive) heart failure: Secondary | ICD-10-CM | POA: Diagnosis not present

## 2015-10-28 DIAGNOSIS — Z9181 History of falling: Secondary | ICD-10-CM | POA: Diagnosis not present

## 2015-10-28 DIAGNOSIS — E785 Hyperlipidemia, unspecified: Secondary | ICD-10-CM | POA: Diagnosis not present

## 2015-10-28 DIAGNOSIS — I110 Hypertensive heart disease with heart failure: Secondary | ICD-10-CM | POA: Diagnosis not present

## 2015-11-04 DIAGNOSIS — N183 Chronic kidney disease, stage 3 (moderate): Secondary | ICD-10-CM | POA: Diagnosis not present

## 2015-11-04 DIAGNOSIS — M1A9XX0 Chronic gout, unspecified, without tophus (tophi): Secondary | ICD-10-CM | POA: Diagnosis not present

## 2015-11-04 DIAGNOSIS — I10 Essential (primary) hypertension: Secondary | ICD-10-CM | POA: Diagnosis not present

## 2015-11-04 DIAGNOSIS — I5042 Chronic combined systolic (congestive) and diastolic (congestive) heart failure: Secondary | ICD-10-CM | POA: Diagnosis not present

## 2015-11-05 DIAGNOSIS — I110 Hypertensive heart disease with heart failure: Secondary | ICD-10-CM | POA: Diagnosis not present

## 2015-11-05 DIAGNOSIS — Z9181 History of falling: Secondary | ICD-10-CM | POA: Diagnosis not present

## 2015-11-05 DIAGNOSIS — M109 Gout, unspecified: Secondary | ICD-10-CM | POA: Diagnosis not present

## 2015-11-05 DIAGNOSIS — I5042 Chronic combined systolic (congestive) and diastolic (congestive) heart failure: Secondary | ICD-10-CM | POA: Diagnosis not present

## 2015-11-05 DIAGNOSIS — I825Z3 Chronic embolism and thrombosis of unspecified deep veins of distal lower extremity, bilateral: Secondary | ICD-10-CM | POA: Diagnosis not present

## 2015-11-05 DIAGNOSIS — E785 Hyperlipidemia, unspecified: Secondary | ICD-10-CM | POA: Diagnosis not present

## 2015-11-11 DIAGNOSIS — H25813 Combined forms of age-related cataract, bilateral: Secondary | ICD-10-CM | POA: Diagnosis not present

## 2015-11-11 DIAGNOSIS — H401131 Primary open-angle glaucoma, bilateral, mild stage: Secondary | ICD-10-CM | POA: Diagnosis not present

## 2015-11-13 DIAGNOSIS — I825Z3 Chronic embolism and thrombosis of unspecified deep veins of distal lower extremity, bilateral: Secondary | ICD-10-CM | POA: Diagnosis not present

## 2015-11-13 DIAGNOSIS — I110 Hypertensive heart disease with heart failure: Secondary | ICD-10-CM | POA: Diagnosis not present

## 2015-11-13 DIAGNOSIS — Z9181 History of falling: Secondary | ICD-10-CM | POA: Diagnosis not present

## 2015-11-13 DIAGNOSIS — E785 Hyperlipidemia, unspecified: Secondary | ICD-10-CM | POA: Diagnosis not present

## 2015-11-13 DIAGNOSIS — I5042 Chronic combined systolic (congestive) and diastolic (congestive) heart failure: Secondary | ICD-10-CM | POA: Diagnosis not present

## 2015-11-13 DIAGNOSIS — M109 Gout, unspecified: Secondary | ICD-10-CM | POA: Diagnosis not present

## 2015-11-20 DIAGNOSIS — Z9181 History of falling: Secondary | ICD-10-CM | POA: Diagnosis not present

## 2015-11-20 DIAGNOSIS — I825Z3 Chronic embolism and thrombosis of unspecified deep veins of distal lower extremity, bilateral: Secondary | ICD-10-CM | POA: Diagnosis not present

## 2015-11-20 DIAGNOSIS — I110 Hypertensive heart disease with heart failure: Secondary | ICD-10-CM | POA: Diagnosis not present

## 2015-11-20 DIAGNOSIS — M109 Gout, unspecified: Secondary | ICD-10-CM | POA: Diagnosis not present

## 2015-11-20 DIAGNOSIS — E785 Hyperlipidemia, unspecified: Secondary | ICD-10-CM | POA: Diagnosis not present

## 2015-11-20 DIAGNOSIS — I5042 Chronic combined systolic (congestive) and diastolic (congestive) heart failure: Secondary | ICD-10-CM | POA: Diagnosis not present

## 2015-11-28 DIAGNOSIS — I825Z3 Chronic embolism and thrombosis of unspecified deep veins of distal lower extremity, bilateral: Secondary | ICD-10-CM | POA: Diagnosis not present

## 2015-11-28 DIAGNOSIS — E785 Hyperlipidemia, unspecified: Secondary | ICD-10-CM | POA: Diagnosis not present

## 2015-11-28 DIAGNOSIS — I110 Hypertensive heart disease with heart failure: Secondary | ICD-10-CM | POA: Diagnosis not present

## 2015-11-28 DIAGNOSIS — M109 Gout, unspecified: Secondary | ICD-10-CM | POA: Diagnosis not present

## 2015-11-28 DIAGNOSIS — Z9181 History of falling: Secondary | ICD-10-CM | POA: Diagnosis not present

## 2015-11-28 DIAGNOSIS — I5042 Chronic combined systolic (congestive) and diastolic (congestive) heart failure: Secondary | ICD-10-CM | POA: Diagnosis not present

## 2015-12-03 DIAGNOSIS — I10 Essential (primary) hypertension: Secondary | ICD-10-CM | POA: Diagnosis not present

## 2015-12-03 DIAGNOSIS — N183 Chronic kidney disease, stage 3 (moderate): Secondary | ICD-10-CM | POA: Diagnosis not present

## 2015-12-03 DIAGNOSIS — I5042 Chronic combined systolic (congestive) and diastolic (congestive) heart failure: Secondary | ICD-10-CM | POA: Diagnosis not present

## 2015-12-03 DIAGNOSIS — M109 Gout, unspecified: Secondary | ICD-10-CM | POA: Diagnosis not present

## 2015-12-04 DIAGNOSIS — E785 Hyperlipidemia, unspecified: Secondary | ICD-10-CM | POA: Diagnosis not present

## 2015-12-04 DIAGNOSIS — M109 Gout, unspecified: Secondary | ICD-10-CM | POA: Diagnosis not present

## 2015-12-04 DIAGNOSIS — I5042 Chronic combined systolic (congestive) and diastolic (congestive) heart failure: Secondary | ICD-10-CM | POA: Diagnosis not present

## 2015-12-04 DIAGNOSIS — I825Z3 Chronic embolism and thrombosis of unspecified deep veins of distal lower extremity, bilateral: Secondary | ICD-10-CM | POA: Diagnosis not present

## 2015-12-04 DIAGNOSIS — Z9181 History of falling: Secondary | ICD-10-CM | POA: Diagnosis not present

## 2015-12-04 DIAGNOSIS — I110 Hypertensive heart disease with heart failure: Secondary | ICD-10-CM | POA: Diagnosis not present

## 2015-12-11 DIAGNOSIS — I5042 Chronic combined systolic (congestive) and diastolic (congestive) heart failure: Secondary | ICD-10-CM | POA: Diagnosis not present

## 2015-12-11 DIAGNOSIS — M109 Gout, unspecified: Secondary | ICD-10-CM | POA: Diagnosis not present

## 2015-12-11 DIAGNOSIS — I825Z3 Chronic embolism and thrombosis of unspecified deep veins of distal lower extremity, bilateral: Secondary | ICD-10-CM | POA: Diagnosis not present

## 2015-12-11 DIAGNOSIS — E785 Hyperlipidemia, unspecified: Secondary | ICD-10-CM | POA: Diagnosis not present

## 2015-12-11 DIAGNOSIS — Z9181 History of falling: Secondary | ICD-10-CM | POA: Diagnosis not present

## 2015-12-11 DIAGNOSIS — I110 Hypertensive heart disease with heart failure: Secondary | ICD-10-CM | POA: Diagnosis not present

## 2015-12-16 DIAGNOSIS — M5136 Other intervertebral disc degeneration, lumbar region: Secondary | ICD-10-CM | POA: Diagnosis not present

## 2015-12-16 DIAGNOSIS — M19011 Primary osteoarthritis, right shoulder: Secondary | ICD-10-CM | POA: Diagnosis not present

## 2015-12-16 DIAGNOSIS — M25511 Pain in right shoulder: Secondary | ICD-10-CM | POA: Diagnosis not present

## 2015-12-16 DIAGNOSIS — M19012 Primary osteoarthritis, left shoulder: Secondary | ICD-10-CM | POA: Diagnosis not present

## 2015-12-16 DIAGNOSIS — Z79899 Other long term (current) drug therapy: Secondary | ICD-10-CM | POA: Diagnosis not present

## 2015-12-16 DIAGNOSIS — M1A09X1 Idiopathic chronic gout, multiple sites, with tophus (tophi): Secondary | ICD-10-CM | POA: Diagnosis not present

## 2015-12-16 DIAGNOSIS — M150 Primary generalized (osteo)arthritis: Secondary | ICD-10-CM | POA: Diagnosis not present

## 2015-12-16 DIAGNOSIS — N189 Chronic kidney disease, unspecified: Secondary | ICD-10-CM | POA: Diagnosis not present

## 2015-12-18 DIAGNOSIS — I5042 Chronic combined systolic (congestive) and diastolic (congestive) heart failure: Secondary | ICD-10-CM | POA: Diagnosis not present

## 2015-12-18 DIAGNOSIS — I825Z3 Chronic embolism and thrombosis of unspecified deep veins of distal lower extremity, bilateral: Secondary | ICD-10-CM | POA: Diagnosis not present

## 2015-12-18 DIAGNOSIS — M109 Gout, unspecified: Secondary | ICD-10-CM | POA: Diagnosis not present

## 2015-12-18 DIAGNOSIS — I110 Hypertensive heart disease with heart failure: Secondary | ICD-10-CM | POA: Diagnosis not present

## 2015-12-18 DIAGNOSIS — Z9181 History of falling: Secondary | ICD-10-CM | POA: Diagnosis not present

## 2015-12-18 DIAGNOSIS — E785 Hyperlipidemia, unspecified: Secondary | ICD-10-CM | POA: Diagnosis not present

## 2015-12-22 ENCOUNTER — Other Ambulatory Visit (HOSPITAL_COMMUNITY): Payer: Self-pay | Admitting: Family Medicine

## 2015-12-22 DIAGNOSIS — R6251 Failure to thrive (child): Secondary | ICD-10-CM

## 2015-12-23 ENCOUNTER — Ambulatory Visit (HOSPITAL_COMMUNITY)
Admission: RE | Admit: 2015-12-23 | Discharge: 2015-12-23 | Disposition: A | Discharge status: Home / Self Care | Payer: Medicare Other | Source: Ambulatory Visit | Attending: Family Medicine | Admitting: Family Medicine

## 2015-12-23 DIAGNOSIS — R6251 Failure to thrive (child): Secondary | ICD-10-CM

## 2015-12-23 DIAGNOSIS — Z431 Encounter for attention to gastrostomy: Secondary | ICD-10-CM | POA: Insufficient documentation

## 2015-12-23 DIAGNOSIS — Z4682 Encounter for fitting and adjustment of non-vascular catheter: Secondary | ICD-10-CM | POA: Diagnosis not present

## 2015-12-23 MED ORDER — LIDOCAINE VISCOUS 2 % MT SOLN
OROMUCOSAL | Status: AC
  Filled 2015-12-23: qty 15

## 2015-12-24 DIAGNOSIS — M12811 Other specific arthropathies, not elsewhere classified, right shoulder: Secondary | ICD-10-CM | POA: Diagnosis not present

## 2015-12-24 DIAGNOSIS — M12812 Other specific arthropathies, not elsewhere classified, left shoulder: Secondary | ICD-10-CM | POA: Diagnosis not present

## 2016-01-21 DIAGNOSIS — M12812 Other specific arthropathies, not elsewhere classified, left shoulder: Secondary | ICD-10-CM | POA: Diagnosis not present

## 2016-01-21 DIAGNOSIS — M12811 Other specific arthropathies, not elsewhere classified, right shoulder: Secondary | ICD-10-CM | POA: Diagnosis not present

## 2016-01-26 DIAGNOSIS — I10 Essential (primary) hypertension: Secondary | ICD-10-CM | POA: Diagnosis not present

## 2016-01-26 DIAGNOSIS — N183 Chronic kidney disease, stage 3 (moderate): Secondary | ICD-10-CM | POA: Diagnosis not present

## 2016-01-26 DIAGNOSIS — Z7901 Long term (current) use of anticoagulants: Secondary | ICD-10-CM | POA: Diagnosis not present

## 2016-03-29 DIAGNOSIS — Z6834 Body mass index (BMI) 34.0-34.9, adult: Secondary | ICD-10-CM | POA: Diagnosis not present

## 2016-03-29 DIAGNOSIS — N183 Chronic kidney disease, stage 3 (moderate): Secondary | ICD-10-CM | POA: Diagnosis not present

## 2016-03-29 DIAGNOSIS — Z7901 Long term (current) use of anticoagulants: Secondary | ICD-10-CM | POA: Diagnosis not present

## 2016-03-29 DIAGNOSIS — I10 Essential (primary) hypertension: Secondary | ICD-10-CM | POA: Diagnosis not present

## 2016-04-16 DIAGNOSIS — M1A09X1 Idiopathic chronic gout, multiple sites, with tophus (tophi): Secondary | ICD-10-CM | POA: Diagnosis not present

## 2016-04-16 DIAGNOSIS — M150 Primary generalized (osteo)arthritis: Secondary | ICD-10-CM | POA: Diagnosis not present

## 2016-04-16 DIAGNOSIS — M5136 Other intervertebral disc degeneration, lumbar region: Secondary | ICD-10-CM | POA: Diagnosis not present

## 2016-04-16 DIAGNOSIS — N189 Chronic kidney disease, unspecified: Secondary | ICD-10-CM | POA: Diagnosis not present

## 2016-05-24 ENCOUNTER — Other Ambulatory Visit: Payer: Self-pay

## 2016-06-28 DIAGNOSIS — I10 Essential (primary) hypertension: Secondary | ICD-10-CM | POA: Diagnosis not present

## 2016-06-28 DIAGNOSIS — Z7901 Long term (current) use of anticoagulants: Secondary | ICD-10-CM | POA: Diagnosis not present

## 2016-10-05 DIAGNOSIS — N183 Chronic kidney disease, stage 3 (moderate): Secondary | ICD-10-CM | POA: Diagnosis not present

## 2016-10-05 DIAGNOSIS — Z7901 Long term (current) use of anticoagulants: Secondary | ICD-10-CM | POA: Diagnosis not present

## 2016-10-05 DIAGNOSIS — I10 Essential (primary) hypertension: Secondary | ICD-10-CM | POA: Diagnosis not present

## 2016-10-18 DIAGNOSIS — M150 Primary generalized (osteo)arthritis: Secondary | ICD-10-CM | POA: Diagnosis not present

## 2016-10-18 DIAGNOSIS — N189 Chronic kidney disease, unspecified: Secondary | ICD-10-CM | POA: Diagnosis not present

## 2016-10-18 DIAGNOSIS — M5136 Other intervertebral disc degeneration, lumbar region: Secondary | ICD-10-CM | POA: Diagnosis not present

## 2016-10-18 DIAGNOSIS — M1A09X1 Idiopathic chronic gout, multiple sites, with tophus (tophi): Secondary | ICD-10-CM | POA: Diagnosis not present

## 2017-01-03 DIAGNOSIS — N183 Chronic kidney disease, stage 3 (moderate): Secondary | ICD-10-CM | POA: Diagnosis not present

## 2017-01-03 DIAGNOSIS — I10 Essential (primary) hypertension: Secondary | ICD-10-CM | POA: Diagnosis not present

## 2017-01-03 DIAGNOSIS — Z7901 Long term (current) use of anticoagulants: Secondary | ICD-10-CM | POA: Diagnosis not present

## 2017-03-08 ENCOUNTER — Observation Stay (HOSPITAL_COMMUNITY)
Admission: EM | Admit: 2017-03-08 | Discharge: 2017-03-09 | Disposition: A | Discharge status: Home / Self Care | Payer: Medicare Other | Attending: Internal Medicine | Admitting: Internal Medicine

## 2017-03-08 ENCOUNTER — Emergency Department (HOSPITAL_COMMUNITY): Payer: Medicare Other

## 2017-03-08 ENCOUNTER — Encounter (HOSPITAL_COMMUNITY): Payer: Self-pay

## 2017-03-08 DIAGNOSIS — I5042 Chronic combined systolic (congestive) and diastolic (congestive) heart failure: Secondary | ICD-10-CM | POA: Insufficient documentation

## 2017-03-08 DIAGNOSIS — R17 Unspecified jaundice: Secondary | ICD-10-CM

## 2017-03-08 DIAGNOSIS — I130 Hypertensive heart and chronic kidney disease with heart failure and stage 1 through stage 4 chronic kidney disease, or unspecified chronic kidney disease: Secondary | ICD-10-CM | POA: Insufficient documentation

## 2017-03-08 DIAGNOSIS — E785 Hyperlipidemia, unspecified: Secondary | ICD-10-CM | POA: Insufficient documentation

## 2017-03-08 DIAGNOSIS — Z9289 Personal history of other medical treatment: Secondary | ICD-10-CM

## 2017-03-08 DIAGNOSIS — K852 Alcohol induced acute pancreatitis without necrosis or infection: Principal | ICD-10-CM | POA: Insufficient documentation

## 2017-03-08 DIAGNOSIS — Z888 Allergy status to other drugs, medicaments and biological substances status: Secondary | ICD-10-CM | POA: Diagnosis not present

## 2017-03-08 DIAGNOSIS — Z8674 Personal history of sudden cardiac arrest: Secondary | ICD-10-CM | POA: Diagnosis not present

## 2017-03-08 DIAGNOSIS — Z7982 Long term (current) use of aspirin: Secondary | ICD-10-CM | POA: Diagnosis not present

## 2017-03-08 DIAGNOSIS — Z8673 Personal history of transient ischemic attack (TIA), and cerebral infarction without residual deficits: Secondary | ICD-10-CM | POA: Diagnosis not present

## 2017-03-08 DIAGNOSIS — K760 Fatty (change of) liver, not elsewhere classified: Secondary | ICD-10-CM

## 2017-03-08 DIAGNOSIS — R112 Nausea with vomiting, unspecified: Secondary | ICD-10-CM

## 2017-03-08 DIAGNOSIS — K859 Acute pancreatitis without necrosis or infection, unspecified: Secondary | ICD-10-CM | POA: Diagnosis present

## 2017-03-08 DIAGNOSIS — M1000 Idiopathic gout, unspecified site: Secondary | ICD-10-CM | POA: Diagnosis not present

## 2017-03-08 DIAGNOSIS — I700 Atherosclerosis of aorta: Secondary | ICD-10-CM | POA: Diagnosis not present

## 2017-03-08 DIAGNOSIS — D631 Anemia in chronic kidney disease: Secondary | ICD-10-CM | POA: Diagnosis not present

## 2017-03-08 DIAGNOSIS — I10 Essential (primary) hypertension: Secondary | ICD-10-CM | POA: Diagnosis present

## 2017-03-08 DIAGNOSIS — R748 Abnormal levels of other serum enzymes: Secondary | ICD-10-CM

## 2017-03-08 DIAGNOSIS — Z87440 Personal history of urinary (tract) infections: Secondary | ICD-10-CM | POA: Insufficient documentation

## 2017-03-08 DIAGNOSIS — F1010 Alcohol abuse, uncomplicated: Secondary | ICD-10-CM | POA: Diagnosis present

## 2017-03-08 DIAGNOSIS — Z79899 Other long term (current) drug therapy: Secondary | ICD-10-CM | POA: Diagnosis not present

## 2017-03-08 DIAGNOSIS — Z86718 Personal history of other venous thrombosis and embolism: Secondary | ICD-10-CM | POA: Diagnosis not present

## 2017-03-08 DIAGNOSIS — F17290 Nicotine dependence, other tobacco product, uncomplicated: Secondary | ICD-10-CM | POA: Insufficient documentation

## 2017-03-08 DIAGNOSIS — R1033 Periumbilical pain: Secondary | ICD-10-CM | POA: Diagnosis not present

## 2017-03-08 DIAGNOSIS — D696 Thrombocytopenia, unspecified: Secondary | ICD-10-CM | POA: Diagnosis not present

## 2017-03-08 DIAGNOSIS — N184 Chronic kidney disease, stage 4 (severe): Secondary | ICD-10-CM | POA: Diagnosis not present

## 2017-03-08 DIAGNOSIS — M109 Gout, unspecified: Secondary | ICD-10-CM | POA: Diagnosis present

## 2017-03-08 DIAGNOSIS — D649 Anemia, unspecified: Secondary | ICD-10-CM

## 2017-03-08 DIAGNOSIS — N400 Enlarged prostate without lower urinary tract symptoms: Secondary | ICD-10-CM | POA: Diagnosis not present

## 2017-03-08 DIAGNOSIS — K4090 Unilateral inguinal hernia, without obstruction or gangrene, not specified as recurrent: Secondary | ICD-10-CM | POA: Diagnosis not present

## 2017-03-08 DIAGNOSIS — N179 Acute kidney failure, unspecified: Secondary | ICD-10-CM | POA: Insufficient documentation

## 2017-03-08 DIAGNOSIS — R1031 Right lower quadrant pain: Secondary | ICD-10-CM

## 2017-03-08 DIAGNOSIS — I5022 Chronic systolic (congestive) heart failure: Secondary | ICD-10-CM | POA: Diagnosis present

## 2017-03-08 DIAGNOSIS — Z7901 Long term (current) use of anticoagulants: Secondary | ICD-10-CM | POA: Diagnosis not present

## 2017-03-08 LAB — CBC
HCT: 38.5 % — ABNORMAL LOW (ref 39.0–52.0)
HEMOGLOBIN: 12.9 g/dL — AB (ref 13.0–17.0)
MCH: 30.6 pg (ref 26.0–34.0)
MCHC: 33.5 g/dL (ref 30.0–36.0)
MCV: 91.2 fL (ref 78.0–100.0)
PLATELETS: 131 10*3/uL — AB (ref 150–400)
RBC: 4.22 MIL/uL (ref 4.22–5.81)
RDW: 14.4 % (ref 11.5–15.5)
WBC: 5.6 10*3/uL (ref 4.0–10.5)

## 2017-03-08 LAB — URINALYSIS, ROUTINE W REFLEX MICROSCOPIC
Bilirubin Urine: NEGATIVE
GLUCOSE, UA: NEGATIVE mg/dL
Hgb urine dipstick: NEGATIVE
KETONES UR: NEGATIVE mg/dL
Leukocytes, UA: NEGATIVE
NITRITE: NEGATIVE
PH: 5 (ref 5.0–8.0)
Protein, ur: 100 mg/dL — AB
SPECIFIC GRAVITY, URINE: 1.021 (ref 1.005–1.030)

## 2017-03-08 LAB — COMPREHENSIVE METABOLIC PANEL
ALBUMIN: 3.2 g/dL — AB (ref 3.5–5.0)
ALK PHOS: 70 U/L (ref 38–126)
ALT: 12 U/L — AB (ref 17–63)
ANION GAP: 9 (ref 5–15)
AST: 28 U/L (ref 15–41)
BILIRUBIN TOTAL: 1.5 mg/dL — AB (ref 0.3–1.2)
BUN: 16 mg/dL (ref 6–20)
CALCIUM: 8.4 mg/dL — AB (ref 8.9–10.3)
CO2: 23 mmol/L (ref 22–32)
Chloride: 104 mmol/L (ref 101–111)
Creatinine, Ser: 2.05 mg/dL — ABNORMAL HIGH (ref 0.61–1.24)
GFR calc Af Amer: 37 mL/min — ABNORMAL LOW (ref >60)
GFR calc non Af Amer: 32 mL/min — ABNORMAL LOW (ref >60)
GLUCOSE: 97 mg/dL (ref 65–99)
Potassium: 4.3 mmol/L (ref 3.5–5.1)
Sodium: 136 mmol/L (ref 135–145)
TOTAL PROTEIN: 7 g/dL (ref 6.5–8.1)

## 2017-03-08 LAB — LIPASE, BLOOD: Lipase: 325 U/L — ABNORMAL HIGH (ref 11–51)

## 2017-03-08 MED ORDER — PANTOPRAZOLE SODIUM 40 MG PO TBEC
40.0000 mg | DELAYED_RELEASE_TABLET | Freq: Every day | ORAL | Status: DC
  Administered 2017-03-09: 40 mg via ORAL
  Filled 2017-03-08: qty 1

## 2017-03-08 MED ORDER — ONDANSETRON HCL 4 MG PO TABS: 4.0000 mg | ORAL_TABLET | Freq: Four times a day (QID) | ORAL | Status: DC | PRN

## 2017-03-08 MED ORDER — SODIUM CHLORIDE 0.9 % IV SOLN
INTRAVENOUS | Status: DC
  Administered 2017-03-09: 02:00:00 via INTRAVENOUS

## 2017-03-08 MED ORDER — ADULT MULTIVITAMIN W/MINERALS CH
1.0000 | ORAL_TABLET | Freq: Every day | ORAL | Status: DC
  Administered 2017-03-09: 1 via ORAL
  Filled 2017-03-08: qty 1

## 2017-03-08 MED ORDER — ACETAMINOPHEN 325 MG PO TABS: 650.0000 mg | ORAL_TABLET | Freq: Four times a day (QID) | ORAL | Status: DC | PRN

## 2017-03-08 MED ORDER — ONDANSETRON HCL 4 MG/2ML IJ SOLN: 4.0000 mg | Freq: Four times a day (QID) | INTRAMUSCULAR | Status: DC | PRN

## 2017-03-08 MED ORDER — LORAZEPAM 2 MG/ML IJ SOLN
1.0000 mg | Freq: Four times a day (QID) | INTRAMUSCULAR | Status: DC | PRN
Start: 2017-03-08 — End: 2017-03-09

## 2017-03-08 MED ORDER — SODIUM CHLORIDE 0.9% FLUSH
3.0000 mL | Freq: Two times a day (BID) | INTRAVENOUS | Status: DC
  Administered 2017-03-08: 3 mL via INTRAVENOUS

## 2017-03-08 MED ORDER — RIVAROXABAN 20 MG PO TABS
20.0000 mg | ORAL_TABLET | Freq: Every day | ORAL | Status: DC
  Administered 2017-03-08: 20 mg via ORAL
  Filled 2017-03-08: qty 1

## 2017-03-08 MED ORDER — LORAZEPAM 2 MG/ML IJ SOLN: 0.0000 mg | Freq: Two times a day (BID) | INTRAMUSCULAR | Status: DC

## 2017-03-08 MED ORDER — DOCUSATE SODIUM 100 MG PO CAPS
100.0000 mg | ORAL_CAPSULE | Freq: Two times a day (BID) | ORAL | Status: DC
  Administered 2017-03-08 – 2017-03-09 (×2): 100 mg via ORAL
  Filled 2017-03-08 (×2): qty 1

## 2017-03-08 MED ORDER — IOPAMIDOL (ISOVUE-300) INJECTION 61%
INTRAVENOUS | Status: AC
  Filled 2017-03-08: qty 30

## 2017-03-08 MED ORDER — LORAZEPAM 2 MG/ML IJ SOLN: 0.0000 mg | Freq: Four times a day (QID) | INTRAMUSCULAR | Status: DC

## 2017-03-08 MED ORDER — FOLIC ACID 1 MG PO TABS
1.0000 mg | ORAL_TABLET | Freq: Every day | ORAL | Status: DC
  Administered 2017-03-09: 1 mg via ORAL
  Filled 2017-03-08: qty 1

## 2017-03-08 MED ORDER — FEBUXOSTAT 40 MG PO TABS
80.0000 mg | ORAL_TABLET | Freq: Every day | ORAL | Status: DC
  Administered 2017-03-08 – 2017-03-09 (×2): 80 mg via ORAL
  Filled 2017-03-08 (×2): qty 2

## 2017-03-08 MED ORDER — VITAMIN B-1 100 MG PO TABS
100.0000 mg | ORAL_TABLET | Freq: Every day | ORAL | Status: DC
  Administered 2017-03-09: 100 mg via ORAL
  Filled 2017-03-08: qty 1

## 2017-03-08 MED ORDER — SODIUM CHLORIDE 0.9 % IV BOLUS (SEPSIS)
500.0000 mL | Freq: Once | INTRAVENOUS | Status: AC
  Administered 2017-03-08: 500 mL via INTRAVENOUS

## 2017-03-08 MED ORDER — POLYETHYLENE GLYCOL 3350 17 G PO PACK: 17.0000 g | PACK | Freq: Every day | ORAL | Status: DC | PRN

## 2017-03-08 MED ORDER — ACETAMINOPHEN 650 MG RE SUPP: 650.0000 mg | Freq: Four times a day (QID) | RECTAL | Status: DC | PRN

## 2017-03-08 MED ORDER — ONDANSETRON HCL 4 MG/2ML IJ SOLN
4.0000 mg | Freq: Once | INTRAMUSCULAR | Status: AC
  Administered 2017-03-08: 4 mg via INTRAVENOUS
  Filled 2017-03-08: qty 2

## 2017-03-08 MED ORDER — HYDROCODONE-ACETAMINOPHEN 5-325 MG PO TABS: 1.0000 | ORAL_TABLET | ORAL | Status: DC | PRN

## 2017-03-08 MED ORDER — THIAMINE HCL 100 MG/ML IJ SOLN
Freq: Once | INTRAVENOUS | Status: AC
  Administered 2017-03-08: 18:00:00 via INTRAVENOUS
  Filled 2017-03-08: qty 1000

## 2017-03-08 MED ORDER — LORAZEPAM 1 MG PO TABS: 1.0000 mg | ORAL_TABLET | Freq: Four times a day (QID) | ORAL | Status: DC | PRN

## 2017-03-08 NOTE — Care Management Note (Addendum)
Case Management Note  Patient Details  Name: Mitchell [name] MRN: [id number] Date of Birth: May 05, 1951  Subjective/Objective:     Presents with pancreatitis,   history of cardiac arrest, hypertension, dyslipidemia, chronic systolic CHF, alcohol abuse, gout, chronic DVT/Xarelto. Drinks 4-5 drinks of alcohol on a daily basis.        Macrae Wiegman (Spouse)      [phone]      PCP: [name] MD  Action/Plan: Discharge planning in process...Marland KitchenCM following .  Expected Discharge Date:                  Expected Discharge Plan:  Home/Self Care  In-House Referral:     Discharge planning Services  CM Consult  Post Acute Care Choice:    Choice offered to:     DME Arranged:    DME Agency:     HH Arranged:    HH Agency:     Status of Service:  In process, will continue to follow  If discussed at Long Length of Stay Meetings, dates discussed:    Additional Comments:  Sharin Mons, RN 03/08/2017, 8:01 PM

## 2017-03-08 NOTE — Progress Notes (Signed)
Pt admitted to the unit at [place]. Pt mental status is A&O x 4. Pt oriented to room, staff, and call bell. Skin is intact except where otherwise charted. Call bell within reach. Visitor guidelines reviewed w/ pt and/or family.

## 2017-03-08 NOTE — ED Provider Notes (Signed)
[facility] DEPT Provider Note   CSN: [id number] Arrival date & time: 03/08/17  1219     History   Chief Complaint Chief Complaint  Patient presents with  . Abdominal Pain    HPI Mitchell [name] is a 66 y.o. male with a PMHx of alcohol abuse, gout, BPH, diverticulosis, HTN, HLD, CHF, remote Vfib arrest hospitalization in 1884 complicated by tracheal tear due to traumatic intubation as well as CVA vs anoxic brain injury, with a PSHx of G-tube insertion (and subsequent removal), who presents to the ED with complaints of periumbilical abdominal pain since Friday (4 days ago) with associated decreased appetite, nausea, and one episode of nonbloody nonbilious emesis on Saturday (day after onset of symptoms, but none since then). He called his PCP Dr. Berdine Addison at the [facility] who advised him to come here for "some fluids and xrays or something". Patient states that his abdominal pain was initially constant but has now become more intermittent and less severe, describing it as 5/10 intermittent aching nonradiating periumbilical pain that worsens with standing or drinking cold liquids, and has been unrelieved with prune juice, with no other treatments tried. Patient's wife reports that he hasn't been eating much or drinking much since onset of symptoms. He has not had any further episodes of emesis. He does endorse daily alcohol use, states that he drinks about 4 shots of liquor per day, his last alcohol consumption was on Friday when he had about 3 shots. His last bowel movement was last night which was slightly looser than normal however he denies any diarrhea.   He also denies fevers, chills, CP, SOB, diarrhea, constipation, obstipation, melena, hematochezia, hematemesis, hematuria, dysuria, testicular pain/swelling, penile discharge, myalgias, arthralgias, numbness, tingling, focal weakness, or any other complaints at this time. Denies recent travel, sick contacts, suspicious food intake, or NSAID use. No  other abdominal surgeries aside from his G-tube placement.   The history is provided by the patient and medical records. No language interpreter was used.  Abdominal Pain   This is a new problem. The current episode started more than 2 days ago. The problem occurs daily. The problem has been gradually improving. The pain is associated with an unknown factor. The pain is located in the periumbilical region. The quality of the pain is aching. The pain is at a severity of 5/10. The pain is mild. Associated symptoms include nausea and vomiting. Pertinent negatives include fever, diarrhea, flatus, hematochezia, melena, constipation, dysuria, hematuria, arthralgias and myalgias. Exacerbated by: standing, and cold fluids. Nothing relieves the symptoms. Past medical history comments: diverticulosis.    Past Medical History:  Diagnosis Date  . Alcohol abuse   . Gout   . Hyperlipemia   . Hypertension     Patient Active Problem List   Diagnosis Date Noted  . Chronic combined systolic and diastolic CHF (congestive heart failure) ([place]) 07/28/2015  . PEG (percutaneous endoscopic gastrostomy) status ([place]) 07/28/2015  . History of stroke 07/28/2015  . Primary gout   . Mental status change   . Dysphagia   . Acute respiratory failure with hypoxemia ([place])   . Pulmonary edema   . Tracheal perforation   . CAP (community acquired pneumonia)   . Chronic systolic CHF (congestive heart failure) ([place])   . Acute renal failure ([place])   . Alcohol abuse   . Anoxic brain injury ([place])   . Thrombocytopenia ([place])   . History of ETT   . Cardiac arrest ([place]) 07/08/2015  . URINARY TRACT INFECTION, RECURRENT  01/04/2007  . 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 Hyperlipidemia 11/24/2006  . Gout, unspecified 11/24/2006  . HYPERTENSION, BENIGN SYSTEMIC 11/24/2006  . HEMORRHOIDS, NOS 11/24/2006  . DIVERTICULOSIS OF COLON 11/24/2006    Past Surgical History:  Procedure Laterality Date  .  RADIOLOGY WITH ANESTHESIA N/A 07/17/2015   Procedure: RADIOLOGY WITH ANESTHESIA;  Surgeon: Medication Radiologist, MD;  Location: [facility];  Service: Radiology;  Laterality: N/A;       Home Medications    Prior to Admission medications   Medication Sig Start Date End Date Taking? Authorizing Provider  Amino Acids-Protein Hydrolys (FEEDING SUPPLEMENT, PRO-STAT SUGAR FREE 64,) LIQD Place 30 mLs into feeding tube 2 (two) times daily. 07/19/15   Allie Bossier, MD  aspirin EC 81 MG EC tablet Take 1 tablet (81 mg total) by mouth daily. 07/19/15   Allie Bossier, MD  folic acid (FOLVITE) 1 MG tablet Take 1 tablet (1 mg total) by mouth daily. 07/19/15   Allie Bossier, MD  furosemide (LASIX) 20 MG tablet Take 1 tablet (20 mg total) by mouth daily. 07/27/15   Cherene Altes, MD  metoprolol (LOPRESSOR) 50 MG tablet Place 1 tablet (50 mg total) into feeding tube 2 (two) times daily. 07/27/15   Cherene Altes, MD  multivitamin (PROSIGHT) TABS tablet Take 1 tablet by mouth daily. 07/19/15   Allie Bossier, MD  pantoprazole (PROTONIX) 40 MG tablet Take 1 tablet (40 mg total) by mouth daily. 07/19/15   Allie Bossier, MD  potassium chloride (KLOR-CON M15) 15 MEQ tablet Take 2 tablets (30 mEq total) by mouth daily. 07/27/15   Cherene Altes, MD  rivaroxaban (XARELTO) 20 MG TABS tablet Take 1 tablet (20 mg total) by mouth daily with supper. 08/14/15   Cherene Altes, MD  thiamine 100 MG tablet Take 1 tablet (100 mg total) by mouth daily. 07/19/15   Allie Bossier, MD    Family History No family history on file.  Social History Social History  Substance Use Topics  . Smoking status: Current Some Day Smoker    Types: Cigars  . Smokeless tobacco: Not on file  . Alcohol use Not on file     Allergies   Hydrochlorothiazide and Pseudoephedrine   Review of Systems Review of Systems  Constitutional: Positive for appetite change. Negative for chills and fever.  Respiratory:  Negative for shortness of breath.   Cardiovascular: Negative for chest pain.  Gastrointestinal: Positive for abdominal pain, nausea and vomiting. Negative for blood in stool, constipation, diarrhea, flatus, hematochezia and melena.  Genitourinary: Negative for discharge, dysuria, hematuria, scrotal swelling and testicular pain.  Musculoskeletal: Negative for arthralgias and myalgias.  Skin: Negative for color change.  Allergic/Immunologic: Negative for immunocompromised state.  Neurological: Negative for weakness and numbness.  Psychiatric/Behavioral: Negative for confusion.   All other systems reviewed and are negative for acute change except as noted in the HPI.    Physical Exam Updated Vital Signs BP (!) 131/91 (BP Location: Right Arm)   Pulse 100   Temp 98.5 F (36.9 C) (Oral)   SpO2 97%   Physical Exam  Constitutional: He is oriented to person, place, and time. Vital signs are normal. He appears well-developed and well-nourished.  Non-toxic appearance. No distress.  Afebrile, nontoxic, NAD  HENT:  Head: Normocephalic and atraumatic.  Mouth/Throat: Oropharynx is clear and moist and mucous membranes are normal.  Eyes: Conjunctivae and EOM are normal. Right eye exhibits no discharge. Left eye  exhibits no discharge.  Neck: Normal range of motion. Neck supple.  Cardiovascular: Normal rate, regular rhythm, normal heart sounds and intact distal pulses.  Exam reveals no gallop and no friction rub.   No murmur heard. Pulmonary/Chest: Effort normal and breath sounds normal. No respiratory distress. He has no decreased breath sounds. He has no wheezes. He has no rhonchi. He has no rales.  Abdominal: Soft. Normal appearance and bowel sounds are normal. He exhibits no distension. There is tenderness in the right lower quadrant. There is tenderness at McBurney's point. There is no rigidity, no rebound, no guarding, no CVA tenderness and negative Murphy's sign.    Soft, nondistended, +BS  throughout, with mild lower abd TTP particularly at RLQ near mcburney's point, no r/g/r, neg murphy's, no CVA TTP; G-tube scar in LUQ well healed  Musculoskeletal: Normal range of motion.  Neurological: He is alert and oriented to person, place, and time. He has normal strength. No sensory deficit.  Skin: Skin is warm, dry and intact. No rash noted.  Psychiatric: He has a normal mood and affect.  Nursing note and vitals reviewed.    ED Treatments / Results  Labs (all labs ordered are listed, but only abnormal results are displayed) Labs Reviewed  LIPASE, BLOOD - Abnormal; Notable for the following:       Result Value   Lipase 325 (*)    All other components within normal limits  COMPREHENSIVE METABOLIC PANEL - Abnormal; Notable for the following:    Creatinine, Ser 2.05 (*)    Calcium 8.4 (*)    Albumin 3.2 (*)    ALT 12 (*)    Total Bilirubin 1.5 (*)    GFR calc non Af Amer 32 (*)    GFR calc Af Amer 37 (*)    All other components within normal limits  CBC - Abnormal; Notable for the following:    Hemoglobin 12.9 (*)    HCT 38.5 (*)    Platelets 131 (*)    All other components within normal limits  URINALYSIS, ROUTINE W REFLEX MICROSCOPIC - Abnormal; Notable for the following:    Color, Urine AMBER (*)    APPearance HAZY (*)    Protein, ur 100 (*)    Bacteria, UA RARE (*)    Squamous Epithelial / LPF 0-5 (*)    All other components within normal limits  URINE CULTURE    EKG  EKG Interpretation None       Radiology Ct Abdomen Pelvis Wo Contrast  Result Date: 03/08/2017 CLINICAL DATA:  Acute periumbilical abdominal pain. EXAM: CT ABDOMEN AND PELVIS WITHOUT CONTRAST TECHNIQUE: Multidetector CT imaging of the abdomen and pelvis was performed following the standard protocol without IV contrast. COMPARISON:  None. FINDINGS: Lower chest: No acute abnormality. Hepatobiliary: No gallstones are noted. Probable fatty infiltration of the liver is noted. Pancreas: Minimal  inflammatory changes are noted around the pancreas; acute pancreatitis cannot be excluded. Spleen: Normal in size without focal abnormality. Adrenals/Urinary Tract: Adrenal glands are unremarkable. Kidneys are normal, without renal calculi, focal lesion, or hydronephrosis. Bladder is unremarkable. Stomach/Bowel: Stomach is within normal limits. Appendix appears normal. No evidence of bowel wall thickening, distention, or inflammatory changes. Vascular/Lymphatic: Aortic atherosclerosis. No enlarged abdominal or pelvic lymph nodes. Reproductive: Prostate is unremarkable. Other: Small fat containing right inguinal hernia is noted. Musculoskeletal: No acute or significant osseous findings. IMPRESSION: Probable fatty infiltration of the liver. Possible minimal inflammatory changes are noted around the pancreas. Correlation with lipase levels is recommended  to evaluate for acute pancreatitis. Aortic atherosclerosis. Small fat containing right inguinal hernia. Electronically Signed   By: Marijo Conception, M.D.   On: 03/08/2017 16:33    Procedures Procedures (including critical care time)  Medications Ordered in ED Medications  iopamidol (ISOVUE-300) 61 % injection (not administered)  sodium chloride 0.9 % bolus 500 mL (0 mLs Intravenous Stopped 03/08/17 1603)  ondansetron (ZOFRAN) injection 4 mg (4 mg Intravenous Given 03/08/17 1357)     Initial Impression / Assessment and Plan / ED Course  I have reviewed the triage vital signs and the nursing notes.  Pertinent labs & imaging results that were available during my care of the patient were reviewed by me and considered in my medical decision making (see chart for details).     66 y.o. male here with periumbilical pain x4 days, diminished appetite, nausea, and one episode of vomiting 3 days ago. On exam, mild lower abd TTP mostly in RLQ, nonperitoneal, but near mcburney's point. G-tube scar well healed. Labs thus far: CBC with mild anemia, improved from  prior values, no leukocytosis. Lipase and CMP Pending. Will await these, and U/A, and obtain CT abd/pelv to r/o appendicitis vs diverticulitis vs other etiology. Will give gentle hydration since pt has CHF; will give zofran. Pt declines wanting anything for pain. Will reassess shortly. Discussed case with my attending Dr. Darl Householder who agrees with plan.  2:18 PM CMP showing mildly bumped Cr 2.05 (baseline 1.5-1.7) but BUN unremarkable, AST/ALT/Alk phos WNL but bili slightly elevated at 1.5. CT tech called and stated due to his Cr, we can't have IV contrast study, so changed to PO contrast only study. Lipase slightly elevated at 325, so this could be pancreatitis although his level isn't as high as we would expect (although could be trending down, since symptoms started 4 days ago), and his pain is lower than one would anticipate; but still a possibility, will await CT to eval this. U/A with 0-5 squamous, WBC, and RBCs, with rare bacteria and hyaline casts; likely contaminant, doubt UTI, will send for culture. Will await CT and reassess shortly.   4:40 PM CT showing inflammatory changes around pancreas, consistent with acute pancreatitis given his elevated lipase level. Also shows some fatty infiltration of liver. Will proceed with admission for acute pancreatitis, likely alcohol induced.   4:48 PM Dr. Posey Pronto of [facility] returning page and will admit. Holding orders to be placed by admitting team. Please see their notes for further documentation of care. I appreciate their help with this pleasant pt's care. Pt stable at time of admission.     Final Clinical Impressions(s) / ED Diagnoses   Final diagnoses:  RLQ abdominal pain  Periumbilical abdominal pain  Nausea and vomiting in adult patient  Elevated lipase  AKI (acute kidney injury) ([place])  Elevated bilirubin  Chronic anemia  Alcohol abuse  Alcohol-induced acute pancreatitis, unspecified complication status  Fatty liver    New Prescriptions New  Prescriptions   No medications on [address] [place], [place], [place] 03/08/17 [place]    Drenda Freeze, MD 03/08/17 [phone]

## 2017-03-08 NOTE — ED Notes (Signed)
Attempted report x1. 

## 2017-03-08 NOTE — ED Notes (Signed)
Patient transported to CT 

## 2017-03-08 NOTE — ED Triage Notes (Signed)
Patient complains of umbilical abdominal pain since Friday with vomiting x 1. States was sent by Criss Rosales for further evaluation. Alert and oriented, NAD

## 2017-03-08 NOTE — ED Notes (Signed)
Admitting at bedside 

## 2017-03-08 NOTE — Progress Notes (Addendum)
ANTICOAGULATION CONSULT NOTE - Initial Consult  Pharmacy Consult for Xarelto  Indication: hx DVT  Allergies  Allergen Reactions  . Hydrochlorothiazide     REACTION: precipitates gout  . Pseudoephedrine     REACTION: rash    Patient Measurements: 85 kg   Vital Signs: Temp: 98.5 F (36.9 C) (06/12 1242) Temp Source: Oral (06/12 1242) BP: 132/92 (06/12 1600) Pulse Rate: 83 (06/12 1600)  Labs:  Recent Labs  03/08/17 1252  HGB 12.9*  HCT 38.5*  PLT 131*  CREATININE 2.05*    CrCl cannot be calculated (Unknown ideal weight.).   Medical History: Past Medical History:  Diagnosis Date  . Alcohol abuse   . Gout   . Hyperlipemia   . Hypertension     Assessment: 65 yo male admitted with abd pain. Pharmacy consulted to continue Xarelto which patient takes PTA for chronic DVT. Patient reports last dose Sunday, 6/10. Based on estimated CrCl ~ 35-40 mL/min, no dosage adjustment needed.  CBC low-stable. No overt s/s bleeding noted.    Plan:  Continue Xarelto 20mg  PO daily  Monitor renal function and for s/s bleeding  CBC at least q72hr    Argie Ramming, PharmD Pharmacy Resident  Pager [phone] 03/08/17 6:02 PM

## 2017-03-08 NOTE — H&P (Addendum)
[facility] History and Physical   Patient: Mitchell [name] OIZ:[id number]   PCP: Patient, No Pcp Per DOB: 1951/04/15   DOA: 03/08/2017   DOS: 03/08/2017   DOS: the patient was seen and examined on 03/08/2017  Patient coming from: The patient is coming from home.  Chief Complaint: Abdominal pain and poor by mouth intake  HPI: Mitchell [name] is a 66 y.o. male with Past medical history of cardiac arrest, hypertension, dyslipidemia, chronic systolic CHF, alcohol abuse, gout, chronic DVT, on Xarelto. Patient presented with complaints of abdominal pain ongoing since last Friday. Patient generally drinks 4-5 drinks of alcohol on a daily basis. Denies any heavy drinking recently. Denies any change in medication. He started having abdominal pain followed by nausea as well as 1 episode of vomiting. The abdominal pain persisted and progressively got worse and was 10 out of 10 in intensity located centrally. Patient has not been eating and drinking anything since this start of the abdominal pain. Medical more lethargic and patient's wife was concerned and requested patient to come to the hospital. Patient's last drink was on Friday. Patient ate peanut butter sandwich yesterday and did not have any abdominal pain. Currently denies any nausea complains about dizziness as well as mild abdominal discomfort. Has not been passing gas since this morning. Did have a bowel movement in the ER.  denies any burning urination. Not anxious.  ED Course: Lab work showed elevated lipase, acute kidney injury, CT showed pancreatitis, patient was referred for admission  At his baseline ambulates without any support And is independent for most of his ADL; manages his medication on his own.  Review of Systems: as mentioned in the history of present illness.  All other systems reviewed and are negative.  Past Medical History:  Diagnosis Date  . Alcohol abuse   . Gout   . Hyperlipemia   . Hypertension    Past Surgical  History:  Procedure Laterality Date  . RADIOLOGY WITH ANESTHESIA N/A 07/17/2015   Procedure: RADIOLOGY WITH ANESTHESIA;  Surgeon: Medication Radiologist, MD;  Location: [facility];  Service: Radiology;  Laterality: N/A;   Social History:  reports that he has been smoking Cigars.  He does not have any smokeless tobacco history on file. His alcohol and drug histories are not on file.  Allergies  Allergen Reactions  . Hydrochlorothiazide     REACTION: precipitates gout  . Pseudoephedrine     REACTION: rash    Family History  Problem Relation Age of Onset  . Aneurysm Sister   . Clotting disorder Brother   . Aneurysm Mother    Prior to Admission medications   Medication Sig Start Date End Date Taking? Authorizing Provider  aspirin EC 81 MG EC tablet Take 1 tablet (81 mg total) by mouth daily. Patient taking differently: Take 81 mg by mouth as needed. Take about 2 a week 07/19/15  Yes Allie Bossier, MD  COLCRYS 0.6 MG tablet Take 0.6 mg by mouth 2 (two) times a week.  01/29/17  Yes [provider]  folic acid (FOLVITE) 1 MG tablet Take 1 tablet (1 mg total) by mouth daily. 07/19/15  Yes Allie Bossier, MD  metoprolol (LOPRESSOR) 50 MG tablet Place 1 tablet (50 mg total) into feeding tube 2 (two) times daily. 07/27/15  Yes Cherene Altes, MD  multivitamin (PROSIGHT) TABS tablet Take 1 tablet by mouth daily. 07/19/15  Yes Allie Bossier, MD  potassium chloride (KLOR-CON M15) 15 MEQ tablet Take 2 tablets (30  mEq total) by mouth daily. 07/27/15  Yes Cherene Altes, MD  rivaroxaban (XARELTO) 20 MG TABS tablet Take 1 tablet (20 mg total) by mouth daily with supper. 08/14/15  Yes Cherene Altes, MD  ULORIC 80 MG TABS Take 1 tablet by mouth daily.  12/28/16  Yes [provider]  Amino Acids-Protein Hydrolys (FEEDING SUPPLEMENT, PRO-STAT SUGAR FREE 64,) LIQD Place 30 mLs into feeding tube 2 (two) times daily. Patient not taking: Reported on 03/08/2017 07/19/15   Allie Bossier, MD    Physical Exam: Vitals:   03/08/17 1430 03/08/17 1500 03/08/17 1530 03/08/17 1600  BP: (!) 150/91 (!) 159/70 (!) 143/64 (!) 132/92  Pulse: 69 60 85 83  Resp:    16  Temp:      TempSrc:      SpO2: 99% 99% 100% 98%    General: Alert, Awake and Oriented to Time, Place and Person. Appear in mild distress, affect appropriate Eyes: PERRL, Conjunctiva normal ENT: Oral Mucosa clear moist Neck: no JVD, no Abnormal Mass Or lumps Cardiovascular: S1 and S2 Present, no Murmur, Peripheral Pulses Present Respiratory: normal respiratory effort, Bilateral Air entry equal and Decreased, no use of accessory muscle, Clear to Auscultation, no Crackles, no wheezes Abdomen: Bowel Sound present, Soft and mild tenderness, no hernia Skin: no redness, no Rash, no induration Extremities: no Pedal edema, no calf tenderness Neurologic: Grossly no focal neuro deficit. Bilaterally Equal motor strength  Labs on Admission:  CBC:  Recent Labs Lab 03/08/17 1252  WBC 5.6  HGB 12.9*  HCT 38.5*  MCV 91.2  PLT 350*   Basic Metabolic Panel:  Recent Labs Lab 03/08/17 1252  NA 136  K 4.3  CL 104  CO2 23  GLUCOSE 97  BUN 16  CREATININE 2.05*  CALCIUM 8.4*   GFR: CrCl cannot be calculated (Unknown ideal weight.). Liver Function Tests:  Recent Labs Lab 03/08/17 1252  AST 28  ALT 12*  ALKPHOS 70  BILITOT 1.5*  PROT 7.0  ALBUMIN 3.2*    Recent Labs Lab 03/08/17 1252  LIPASE 325*   No results for input(s): AMMONIA in the last 168 hours. Coagulation Profile: No results for input(s): INR, PROTIME in the last 168 hours. Cardiac Enzymes: No results for input(s): CKTOTAL, CKMB, CKMBINDEX, TROPONINI in the last 168 hours. BNP (last 3 results) No results for input(s): PROBNP in the last 8760 hours. HbA1C: No results for input(s): HGBA1C in the last 72 hours. CBG: No results for input(s): GLUCAP in the last 168 hours. Lipid Profile: No results for input(s): CHOL, HDL,  LDLCALC, TRIG, CHOLHDL, LDLDIRECT in the last 72 hours. Thyroid Function Tests: No results for input(s): TSH, T4TOTAL, FREET4, T3FREE, THYROIDAB in the last 72 hours. Anemia Panel: No results for input(s): VITAMINB12, FOLATE, FERRITIN, TIBC, IRON, RETICCTPCT in the last 72 hours. Urine analysis:    Component Value Date/Time   COLORURINE AMBER (A) 03/08/2017 1330   APPEARANCEUR HAZY (A) 03/08/2017 1330   LABSPEC 1.021 03/08/2017 1330   PHURINE 5.0 03/08/2017 1330   GLUCOSEU NEGATIVE 03/08/2017 1330   HGBUR NEGATIVE 03/08/2017 1330   BILIRUBINUR NEGATIVE 03/08/2017 1330   KETONESUR NEGATIVE 03/08/2017 1330   PROTEINUR 100 (A) 03/08/2017 1330   UROBILINOGEN 1.0 07/19/2015 1739   NITRITE NEGATIVE 03/08/2017 1330   LEUKOCYTESUR NEGATIVE 03/08/2017 1330    Radiological Exams on Admission: Ct Abdomen Pelvis Wo Contrast  Result Date: 03/08/2017 CLINICAL DATA:  Acute periumbilical abdominal pain. EXAM: CT ABDOMEN AND PELVIS WITHOUT CONTRAST TECHNIQUE:  Multidetector CT imaging of the abdomen and pelvis was performed following the standard protocol without IV contrast. COMPARISON:  None. FINDINGS: Lower chest: No acute abnormality. Hepatobiliary: No gallstones are noted. Probable fatty infiltration of the liver is noted. Pancreas: Minimal inflammatory changes are noted around the pancreas; acute pancreatitis cannot be excluded. Spleen: Normal in size without focal abnormality. Adrenals/Urinary Tract: Adrenal glands are unremarkable. Kidneys are normal, without renal calculi, focal lesion, or hydronephrosis. Bladder is unremarkable. Stomach/Bowel: Stomach is within normal limits. Appendix appears normal. No evidence of bowel wall thickening, distention, or inflammatory changes. Vascular/Lymphatic: Aortic atherosclerosis. No enlarged abdominal or pelvic lymph nodes. Reproductive: Prostate is unremarkable. Other: Small fat containing right inguinal hernia is noted. Musculoskeletal: No acute or significant  osseous findings. IMPRESSION: Probable fatty infiltration of the liver. Possible minimal inflammatory changes are noted around the pancreas. Correlation with lipase levels is recommended to evaluate for acute pancreatitis. Aortic atherosclerosis. Small fat containing right inguinal hernia. Electronically Signed   By: Marijo Conception, M.D.   On: 03/08/2017 16:33   Assessment/Plan 1. Acute pancreatitis Alcohol-induced pancreatitis. CT abdomen shows evidence of minimal inflammatory changes. No necrosis. At this level elevated. Currently the patient mentions his pain is well controlled. At present I would keep the patient on clear liquid diet, hydrated with IV fluids. Recommended patient to quit drinking alcohol. Supportive measures with IV Zofran, IV morphine for pain, Protonix for now.  2. Alcohol abuse. Potential for alcohol withdrawal. The patient drinks heavily currently does not have any withdrawal symptoms but at risk for having withdrawals and next 24-48 hours. We'll continue with CIWA protocol. Monitor. We will give IV banana bag, and started on iron supplementation tomorrow. Patient refuses any assistance or help with regards to alcohol abstinence. He mentions he is going to quit alcohol on discharge.  3. Acute kidney injury on chronic kidney disease stage IV. Last serum creatinine in the chart 1.5. Admission serum creatinine 2.05. Meeting criteria for acute kidney injury. Likely prerenal with poor oral intake due to acute pancreatitis. Would hydrate, recheck tomorrow.  4. History of DVT. Patient had chronic DVT identified in 2016. Has been on Xarelto and anticoagulation since then. Currently I would continue Xarelto, per pharmacy. May need to adjust his renal function worsen.  5. Chronic systolic CHF. Echocardiogram 2016 shows EF of 35%. Currently no evidence of volume overload. We'll monitor.  6. Gout. Patient is taking colchicine twice a week and neurologic. Currently  holding colchicine. Continue that.  7. Hyperbilirubinemia. CT scan no evidence of gallstone, fatty infiltration of the liver. Mild elevation of bilirubin, we'll continue to monitor clinically.  8. Essential hypertension. Sinus bradycardia. The patient has a history of essential hypertension and is taking Lopressor at home. Currently heart it appears low, will monitor on telemetry and hold beta blockers. Check for EKG  Nutrition: clear liquid diet DVT Prophylaxis: on therapeutic anticoagulation.  Advance goals of care discussion: full code   Consults: none  Family Communication: family was present at bedside, at the time of interview.  Opportunity was given to ask question and all questions were answered satisfactorily.  Disposition: Admitted as observation, telemetry unit. Likely to be discharged home, in 1-2 days .  Severity of Illness: The appropriate patient status for this patient is OBSERVATION. Observation status is judged to be reasonable and necessary in order to provide the required intensity of service to ensure the patient's safety. The patient's presenting symptoms, physical exam findings, and initial radiographic and laboratory data in the context  of their medical condition is felt to place them at decreased risk for further clinical deterioration. Furthermore, it is anticipated that the patient will be medically stable for discharge from the hospital within 2 midnights of admission. The following factors support the patient status of observation.   " The patient's presenting symptoms include abdominal pain, anorexia. " The physical exam findings include abdominal tenderness. " The initial radiographic and laboratory data are elevated lipase, CT positive for pancreatitis.    Author: Berle Mull, MD [facility] Pager: [phone] 03/08/2017  If 7PM-7AM, please contact night-coverage [email or web address] Password [email or web address]

## 2017-03-09 ENCOUNTER — Encounter (HOSPITAL_COMMUNITY): Payer: Self-pay

## 2017-03-09 DIAGNOSIS — D649 Anemia, unspecified: Secondary | ICD-10-CM | POA: Diagnosis not present

## 2017-03-09 DIAGNOSIS — K852 Alcohol induced acute pancreatitis without necrosis or infection: Secondary | ICD-10-CM

## 2017-03-09 DIAGNOSIS — I10 Essential (primary) hypertension: Secondary | ICD-10-CM

## 2017-03-09 DIAGNOSIS — I5022 Chronic systolic (congestive) heart failure: Secondary | ICD-10-CM

## 2017-03-09 DIAGNOSIS — N183 Chronic kidney disease, stage 3 (moderate): Secondary | ICD-10-CM

## 2017-03-09 DIAGNOSIS — F1010 Alcohol abuse, uncomplicated: Secondary | ICD-10-CM

## 2017-03-09 LAB — CBC
HEMATOCRIT: 31.3 % — AB (ref 39.0–52.0)
Hemoglobin: 10.2 g/dL — ABNORMAL LOW (ref 13.0–17.0)
MCH: 29.7 pg (ref 26.0–34.0)
MCHC: 32.6 g/dL (ref 30.0–36.0)
MCV: 91.3 fL (ref 78.0–100.0)
PLATELETS: 116 10*3/uL — AB (ref 150–400)
RBC: 3.43 MIL/uL — ABNORMAL LOW (ref 4.22–5.81)
RDW: 14.4 % (ref 11.5–15.5)
WBC: 5.1 10*3/uL (ref 4.0–10.5)

## 2017-03-09 LAB — COMPREHENSIVE METABOLIC PANEL
ALBUMIN: 2.5 g/dL — AB (ref 3.5–5.0)
ALT: 10 U/L — AB (ref 17–63)
AST: 20 U/L (ref 15–41)
Alkaline Phosphatase: 54 U/L (ref 38–126)
Anion gap: 9 (ref 5–15)
BILIRUBIN TOTAL: 1.1 mg/dL (ref 0.3–1.2)
BUN: 15 mg/dL (ref 6–20)
CHLORIDE: 103 mmol/L (ref 101–111)
CO2: 21 mmol/L — ABNORMAL LOW (ref 22–32)
CREATININE: 2.02 mg/dL — AB (ref 0.61–1.24)
Calcium: 7.2 mg/dL — ABNORMAL LOW (ref 8.9–10.3)
GFR calc Af Amer: 38 mL/min — ABNORMAL LOW (ref >60)
GFR, EST NON AFRICAN AMERICAN: 32 mL/min — AB (ref >60)
GLUCOSE: 86 mg/dL (ref 65–99)
POTASSIUM: 4.1 mmol/L (ref 3.5–5.1)
Sodium: 133 mmol/L — ABNORMAL LOW (ref 135–145)
Total Protein: 5.6 g/dL — ABNORMAL LOW (ref 6.5–8.1)

## 2017-03-09 LAB — URINE CULTURE: Culture: NO GROWTH

## 2017-03-09 LAB — LIPASE, BLOOD: Lipase: 280 U/L — ABNORMAL HIGH (ref 11–51)

## 2017-03-09 MED ORDER — THIAMINE HCL 100 MG PO TABS: 100.0000 mg | ORAL_TABLET | Freq: Every day | ORAL | 0 refills | Status: DC

## 2017-03-09 MED ORDER — PANTOPRAZOLE SODIUM 40 MG PO TBEC: 40.0000 mg | DELAYED_RELEASE_TABLET | Freq: Every day | ORAL | 0 refills | Status: DC

## 2017-03-09 MED ORDER — ACETAMINOPHEN 325 MG PO TABS: 650.0000 mg | ORAL_TABLET | Freq: Four times a day (QID) | ORAL | Status: DC | PRN

## 2017-03-09 MED ORDER — METOPROLOL TARTRATE 50 MG PO TABS: 50.0000 mg | ORAL_TABLET | Freq: Two times a day (BID) | ORAL | Status: DC

## 2017-03-09 NOTE — Discharge Instructions (Signed)

## 2017-03-09 NOTE — Progress Notes (Signed)
Pt given discharge instructions, prescriptions, and care notes. Pt verbalized understanding AEB no further questions or concerns at this time. IV was discontinued, no redness, pain, or swelling noted at this time. Telemetry discontinued and Centralized Telemetry was notified. Pt will leave the floor via wheelchair with staff in stable condition.

## 2017-03-09 NOTE — Discharge Summary (Signed)
Physician Discharge Summary  Mitchell [name] SWF:[id number] DOB: 1951/09/07  PCP: [name], MD  Admit date: 03/08/2017 Discharge date: 03/09/2017  Recommendations for Outpatient Follow-up:  1. Dr. Tacey Heap, PCP in 5 days with repeat labs (CBC, BMP & lipase) in 5 days.  Home Health: None Equipment/Devices: None    Discharge Condition: Improved and stable  CODE STATUS: Full  Diet recommendation: Heart healthy/low-fat diet.  Discharge Diagnoses:  Principal Problem:   Acute pancreatitis Active Problems:   Gout, unspecified   HYPERTENSION, BENIGN SYSTEMIC   History of ETT   Chronic systolic CHF (congestive heart failure) (HCC)   Alcohol abuse   History of stroke   Brief Summary: 66 year old male, PMH of cardiac arrest, HTN, HLD, chronic systolic CHF, alcohol abuse, gout, DVT on Xarelto presented with complaints of abdominal pain ongoing since 03/05/17 and a single episode of nausea, nonbloody emesis and diarrhea. He reportedly drinks 4-5 drinks of alcohol on a daily basis without any recent change. No recent change in medications. The abdominal pain progressively got worse to 10 over 10 in severity with associated poor oral intake. He apparently became more lethargic and presented to the hospital. He had not consumed alcohol since 03/05/17. He did eat a peanut butter sandwich on day prior to admission without abdominal pain. In the ED, lab work showed hemoglobin 12.9, platelets 131, creatinine 2.05, total bilirubin 1.5 otherwise unremarkable LFTs, lipase of 325 and CT abdomen showed fatty liver and findings of mild pancreatitis. He was admitted for further evaluation and management.  Assessment and plan:  1. Acute pancreatitis: Possibly related to alcohol abuse. CT abdomen showed fatty liver and minimal inflammatory changes around the pancreas. Initial lipase was 325. He was placed on bowel rest/clear liquid diet, IV fluids. He did well. No further vomiting or diarrhea. Abdominal pain is very  minimal and occasional unrelated to food. He has tolerated soft diet without difficulty. He has been counseled to abstain from alcohol intake and he verbalizes understanding. Lipase decreased slightly to 280. 2. Alcohol abuse: No features of withdrawal. Continue thiamine, folate and multivitamins. Abstinence was counseled. He declined outpatient resources to help with his dependence. 3. Stage III chronic kidney disease: Last known creatinine prior to this admission was 1.58 on 07/27/15. He now presented with creatinine of 2.05. His chronic kidney disease has probably progressed since 2016 and this may be his new baseline. Repeat creatinine is stable at 2.02. Recommend close outpatient follow-up with repeat BMP and consider nephrology consultation. 4. History of DVT: Apparently had in 2016. Unclear as to why he is on prolonged anticoagulation but will defer to his PCP regarding further management. Continue Xarelto. Patient infrequently takes aspirin without clear indication, increases bleeding risk while on Xarelto and hence discontinued. 5. Chronic systolic CHF: 2-D echo 22/02/54: LVEF 30-85 percent with diffuse hypokinesis. Recommend repeating 2-D echo as outpatient to reassess his LV function. Not candidate for ARB/ACEI due to chronic kidney disease. 6. Gout: Continue home dose of colchicine & Uloric. 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 7. Essential hypertension: Controlled. Continue metoprolol. 8. Anemia: May be chronic and related to chronic kidney disease. Recent baseline hemoglobin not known. Follow up CBC as outpatient. No bleeding reported. 9. Thrombocytopenia: May also be chronic related to his alcohol abuse. Follow-up CBCs in a few days as outpatient.   Consultations:  None  Procedures:  None   Discharge Instructions  Discharge Instructions    Call MD for:  difficulty breathing, headache or visual disturbances  Complete by:  As directed    Call MD for:  extreme fatigue     Complete by:  As directed    Call MD for:  persistant dizziness or light-headedness    Complete by:  As directed    Call MD for:  persistant nausea and vomiting    Complete by:  As directed    Call MD for:  severe uncontrolled pain    Complete by:  As directed    Call MD for:  temperature >100.4    Complete by:  As directed    Diet - low sodium heart healthy    Complete by:  As directed    Increase activity slowly    Complete by:  As directed        Medication List    STOP taking these medications   aspirin 81 MG EC tablet   feeding supplement (PRO-STAT SUGAR FREE 64) Liqd     TAKE these medications   acetaminophen 325 MG tablet Commonly known as:  TYLENOL Take 2 tablets (650 mg total) by mouth every 6 (six) hours as needed for mild pain, moderate pain or fever (or Fever >/= 101).   COLCRYS 0.6 MG tablet Generic drug:  colchicine Take 0.6 mg by mouth 2 (two) times a week.   folic acid 1 MG tablet Commonly known as:  FOLVITE Take 1 tablet (1 mg total) by mouth daily.   metoprolol tartrate 50 MG tablet Commonly known as:  LOPRESSOR Take 1 tablet (50 mg total) by mouth 2 (two) times daily. What changed:  how to take this   multivitamin Tabs tablet Take 1 tablet by mouth daily.   pantoprazole 40 MG tablet Commonly known as:  PROTONIX Take 1 tablet (40 mg total) by mouth daily.   potassium chloride SA 15 MEQ tablet Commonly known as:  KLOR-CON M15 Take 2 tablets (30 mEq total) by mouth daily.   rivaroxaban 20 MG Tabs tablet Commonly known as:  XARELTO Take 1 tablet (20 mg total) by mouth daily with supper.   thiamine 100 MG tablet Take 1 tablet (100 mg total) by mouth daily.   ULORIC 80 MG Tabs Generic drug:  Febuxostat Take 1 tablet by mouth daily.      Follow-up Information    [name], MD. Schedule an appointment as soon as possible for a visit in 5 day(s).   Specialty:  Family Medicine Why:  To be seen with repeat labs (CBC & CMP). Contact  information: [name] [place] [address] [phone]          Allergies  Allergen Reactions  . Hydrochlorothiazide     REACTION: precipitates gout  . Pseudoephedrine     REACTION: rash      Procedures/Studies: Ct Abdomen Pelvis Wo Contrast  Result Date: 03/08/2017 CLINICAL DATA:  Acute periumbilical abdominal pain. EXAM: CT ABDOMEN AND PELVIS WITHOUT CONTRAST TECHNIQUE: Multidetector CT imaging of the abdomen and pelvis was performed following the standard protocol without IV contrast. COMPARISON:  None. FINDINGS: Lower chest: No acute abnormality. Hepatobiliary: No gallstones are noted. Probable fatty infiltration of the liver is noted. Pancreas: Minimal inflammatory changes are noted around the pancreas; acute pancreatitis cannot be excluded. Spleen: Normal in size without focal abnormality. Adrenals/Urinary Tract: Adrenal glands are unremarkable. Kidneys are normal, without renal calculi, focal lesion, or hydronephrosis. Bladder is unremarkable. Stomach/Bowel: Stomach is within normal limits. Appendix appears normal. No evidence of bowel wall thickening, distention, or inflammatory changes. Vascular/Lymphatic: Aortic  atherosclerosis. No enlarged abdominal or pelvic lymph nodes. Reproductive: Prostate is unremarkable. Other: Small fat containing right inguinal hernia is noted. Musculoskeletal: No acute or significant osseous findings. IMPRESSION: Probable fatty infiltration of the liver. Possible minimal inflammatory changes are noted around the pancreas. Correlation with lipase levels is recommended to evaluate for acute pancreatitis. Aortic atherosclerosis. Small fat containing right inguinal hernia. Electronically Signed   By: Marijo Conception, M.D.   On: 03/08/2017 16:33      Subjective: Feels much better. No nausea, vomiting or diarrhea. Minimal and intermittent periumbilical abdominal pain but significantly better compared to 2 days ago. Tolerated soft diet without  difficulty.  Discharge Exam:  Vitals:   03/09/17 0107 03/09/17 0534 03/09/17 0750 03/09/17 1514  BP: (!) 144/71 (!) 142/70  131/89  Pulse: (!) 54 (!) 52  73  Resp: 18 18  20   Temp: 98.2 F (36.8 C) 98.3 F (36.8 C)  98.2 F (36.8 C)  TempSrc: Oral Oral  Oral  SpO2: 99% 100%  100%  Weight:   86.2 kg (190 lb)   Height:        General: Pleasant middle-aged male, moderately built and nourished, seen ambulating comfortably in the room. Cardiovascular: S1 & S2 heard, RRR, S1/S2 +. No murmurs, rubs, gallops or clicks. No JVD or pedal edema. Telemetry: Sinus bradycardia in the 50s-SR. Occasional bigeminy and trigeminy. Respiratory: Clear to auscultation without wheezing, rhonchi or crackles. No increased work of breathing. Abdominal:  Non distended, non tender & soft. No organomegaly or masses appreciated. Normal bowel sounds heard. CNS: Alert and oriented. No focal deficits. Extremities: no edema, no cyanosis. 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 No acute findings.    The results of significant diagnostics from this hospitalization (including imaging, microbiology, ancillary and laboratory) are listed below for reference.     Microbiology: Recent Results (from the past 240 hour(s))  Urine culture     Status: None   Collection Time: 03/08/17  1:34 PM  Result Value Ref Range Status   Specimen Description URINE, CLEAN CATCH  Final   Special Requests NONE  Final   Culture NO GROWTH  Final   Report Status 03/09/2017 FINAL  Final     Labs: CBC:  Recent Labs Lab 03/08/17 1252 03/09/17 0507  WBC 5.6 5.1  HGB 12.9* 10.2*  HCT 38.5* 31.3*  MCV 91.2 91.3  PLT 131* 850*   Basic Metabolic Panel:  Recent Labs Lab 03/08/17 1252 03/09/17 0507  NA 136 133*  K 4.3 4.1  CL 104 103  CO2 23 21*  GLUCOSE 97 86  BUN 16 15  CREATININE 2.05* 2.02*  CALCIUM 8.4* 7.2*   Liver Function Tests:  Recent Labs Lab 03/08/17 1252 03/09/17 0507  AST  28 20  ALT 12* 10*  ALKPHOS 70 54  BILITOT 1.5* 1.1  PROT 7.0 5.6*  ALBUMIN 3.2* 2.5*   Urinalysis    Component Value Date/Time   COLORURINE AMBER (A) 03/08/2017 1330   APPEARANCEUR HAZY (A) 03/08/2017 1330   LABSPEC 1.021 03/08/2017 1330   PHURINE 5.0 03/08/2017 1330   GLUCOSEU NEGATIVE 03/08/2017 1330   HGBUR NEGATIVE 03/08/2017 1330   BILIRUBINUR NEGATIVE 03/08/2017 1330   KETONESUR NEGATIVE 03/08/2017 1330   PROTEINUR 100 (A) 03/08/2017 1330   UROBILINOGEN 1.0 07/19/2015 1739   NITRITE NEGATIVE 03/08/2017 1330   LEUKOCYTESUR NEGATIVE 03/08/2017 1330      Time coordinating discharge: Over 30 minutes  SIGNED:  Vernell Leep, MD, FACP,  FHM. [facility] Pager [phone]  If 7PM-7AM, please contact night-coverage [email or web address] Password [email or web address] 03/09/2017, 5:02 PM

## 2017-03-09 NOTE — Care Management Obs Status (Signed)
[facility] NOTIFICATION   Patient Details  Name: Mitchell [name] MRN: [id number] Date of Birth: 1951/06/25   Medicare Observation Status Notification Given:  Yes    Sharin Mons, RN 03/09/2017, 11:34 AM

## 2017-03-15 DIAGNOSIS — K861 Other chronic pancreatitis: Secondary | ICD-10-CM | POA: Diagnosis not present

## 2017-03-25 IMAGING — CR DG CHEST 1V PORT
1 series · 1 of 1 positions shown · non-contrast
Comparison: None.

CLINICAL DATA: Post code.  Assess endotracheal tube.

EXAM:
PORTABLE CHEST 1 VIEW

[AP]
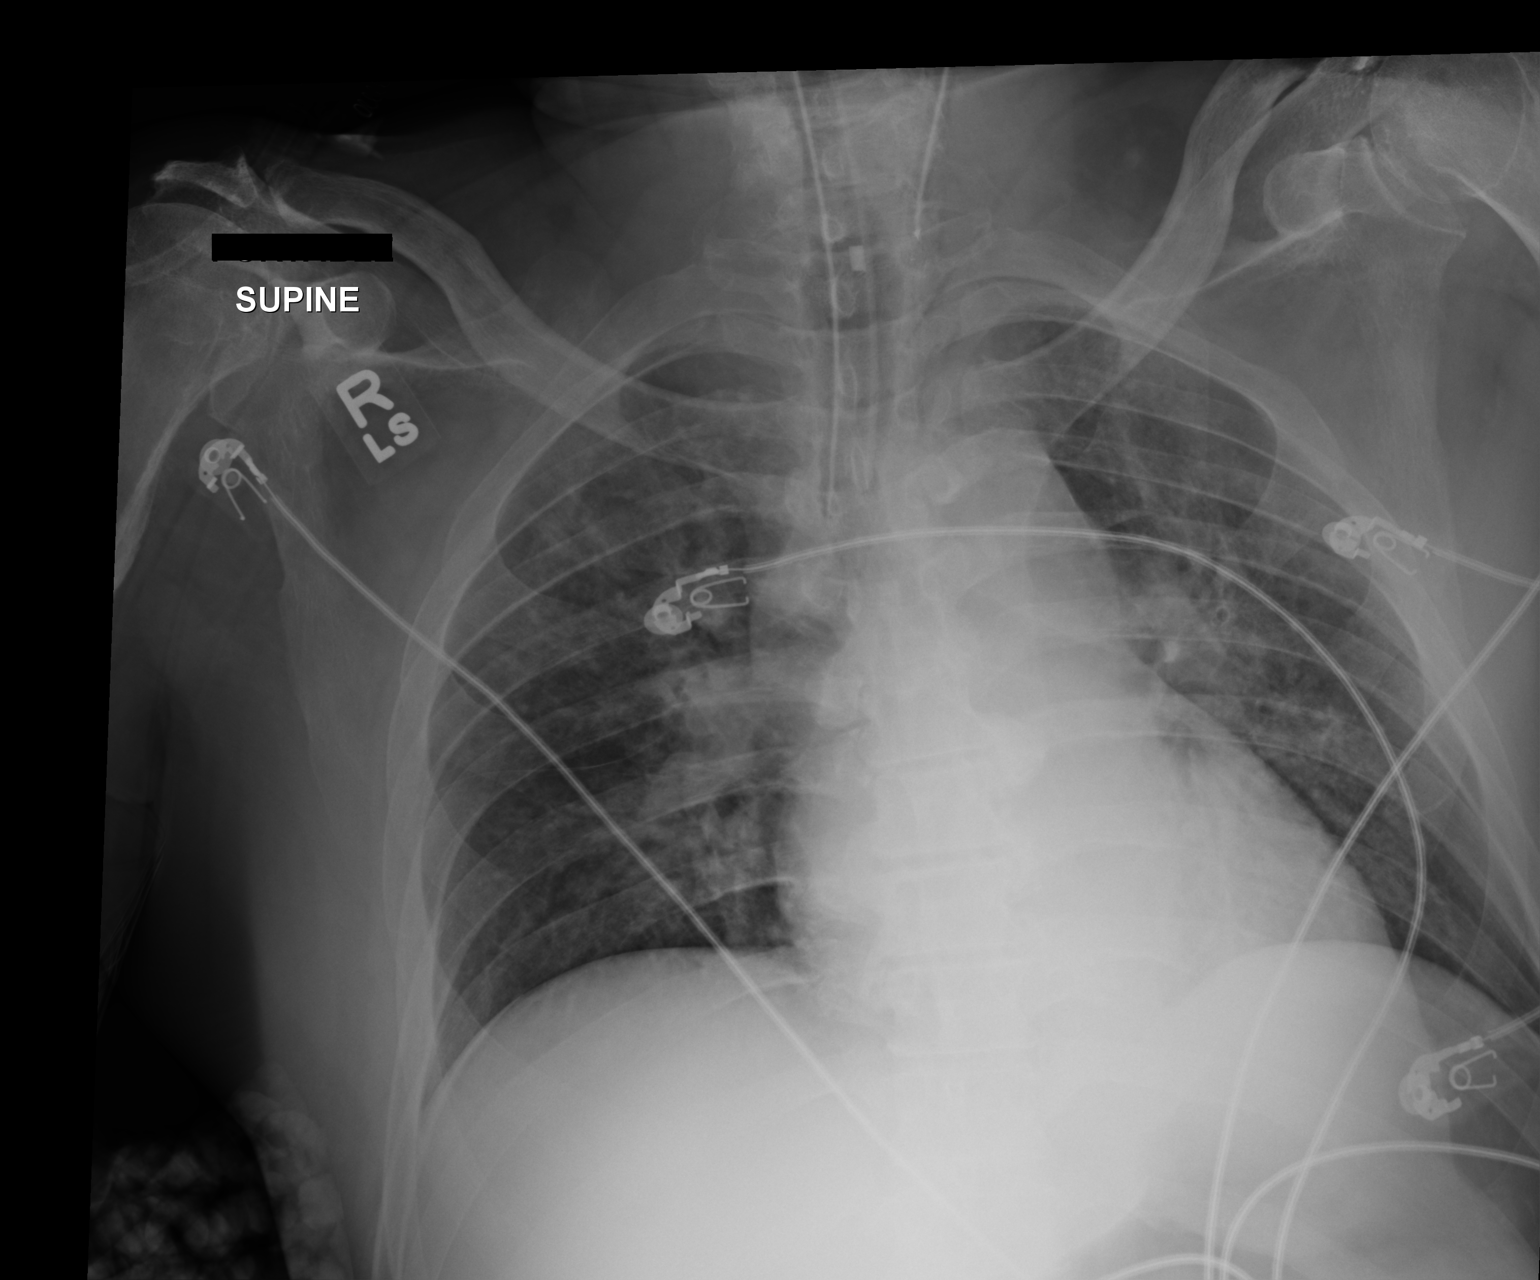

[1 of 1 positions shown; findings below may reference images not displayed]

FINDINGS: Endotracheal tube with tip measuring 3 cm above the carinal.
Additional catheter projected over the left side of the neck is of
nonspecific etiology and may be external. Shallow inspiration.
Normal heart size and pulmonary vascularity. No focal airspace
disease or consolidation in the lungs. No pneumothorax. Visualized
ribs appear intact. Degenerative changes in the spine and shoulders.
IMPRESSION: Endotracheal tube tip measures 3 cm above the carinal. No evidence
of active pulmonary disease.

## 2017-03-25 IMAGING — CT CT ANGIO CHEST
2 of 6 series · 18 of 36 positions shown · IV contrast (Omni 300)
Comparison: Chest radiograph July 08, 2015

CLINICAL DATA: Hypoxia.  Status post cardiac arrest.

EXAM:
CT ANGIOGRAPHY CHEST WITH CONTRAST
TECHNIQUE: Multidetector CT imaging of the chest was performed using the
standard protocol during bolus administration of intravenous
contrast. Multiplanar CT image reconstructions and MIPs were
obtained to evaluate the vascular anatomy.
CONTRAST:  80mL OMNIPAQUE IOHEXOL 350 MG/ML SOLN

[Series 6: pe thins · axial · 0.70mm/px · z∈[-265,+10]mm · 17 of 608 slices shown]
[im 29/608  lung]
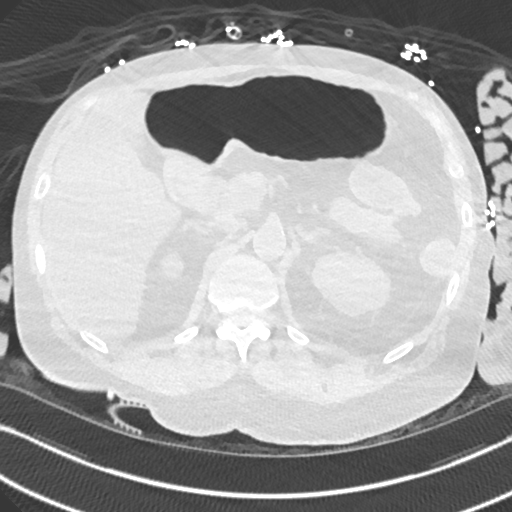
[im 58/608  mediastinal]
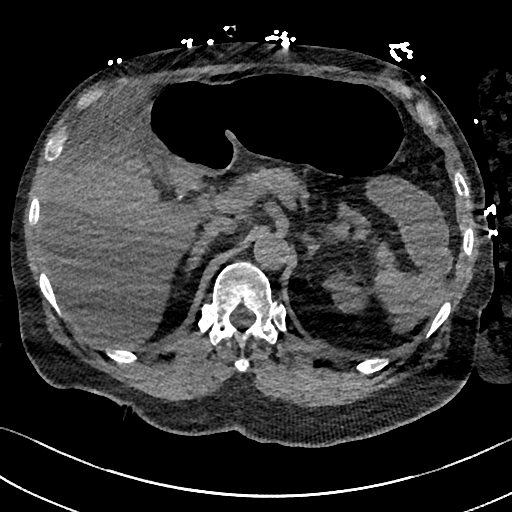
[im 87/608  lung]
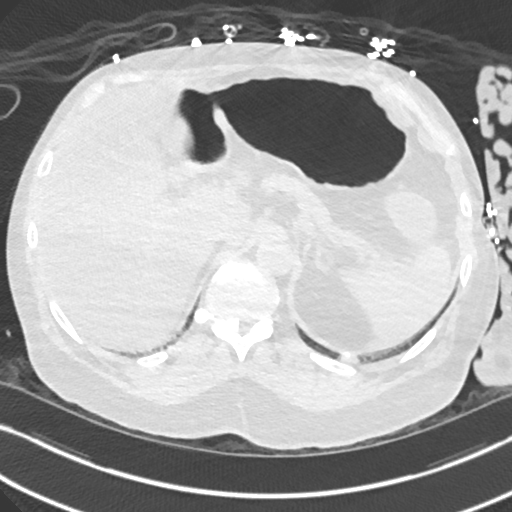
[im 145/608  mediastinal]
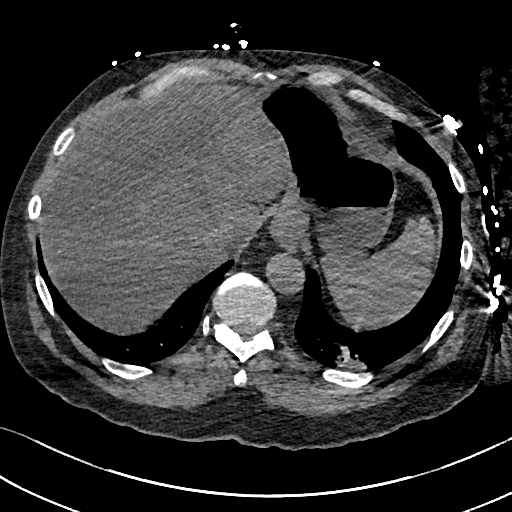
[im 174/608  lung]
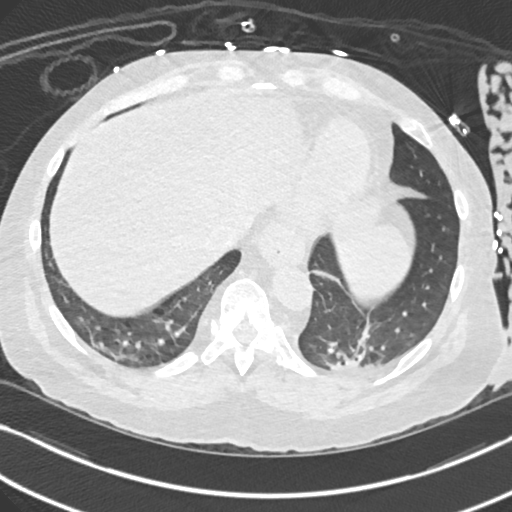
[im 203/608  mediastinal]
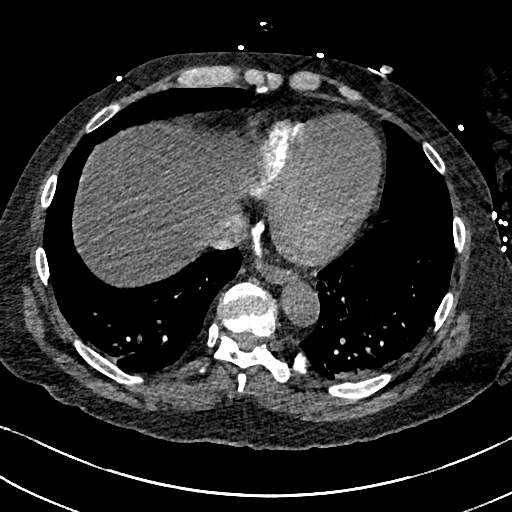
[im 232/608  lung]
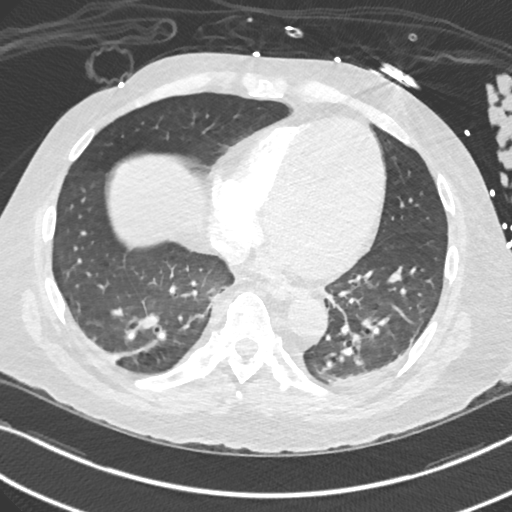
[im 261/608  mediastinal]
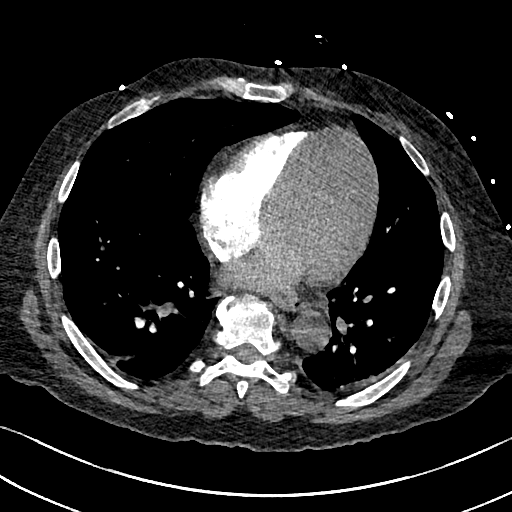
[im 318/608  lung]
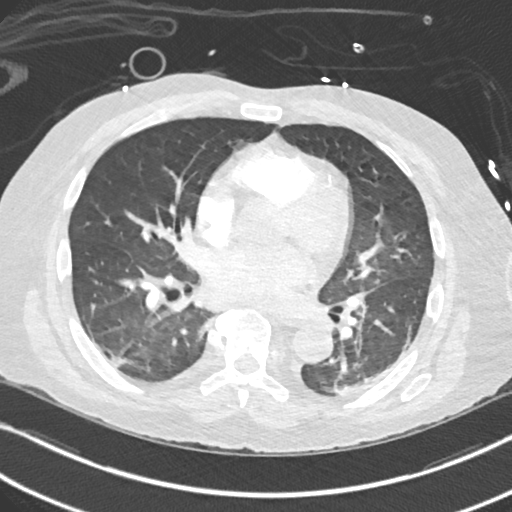
[im 347/608  mediastinal]
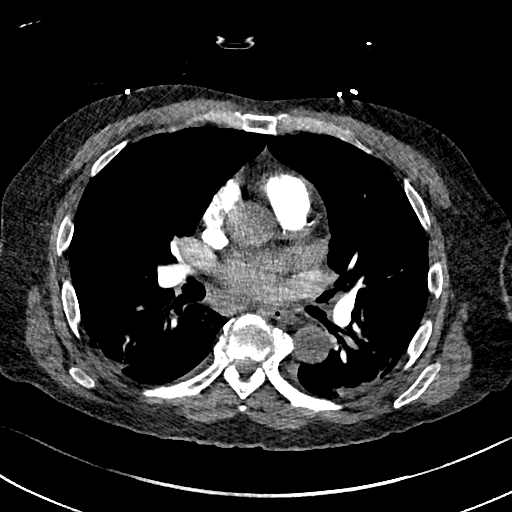
[im 376/608  lung]
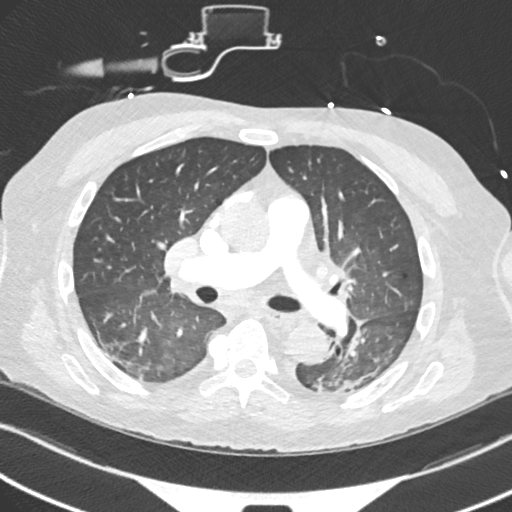
[im 405/608  mediastinal]
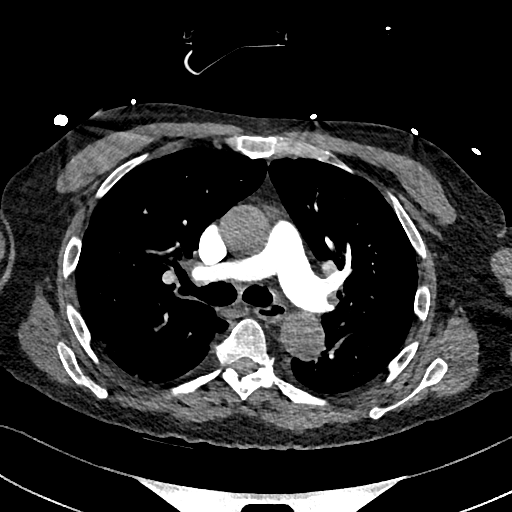
[im 434/608  lung]
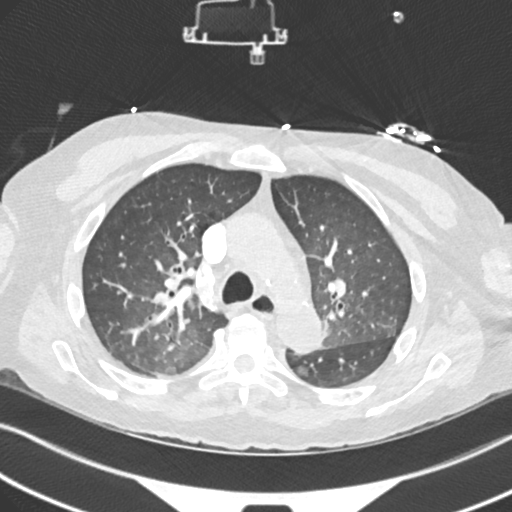
[im 463/608  mediastinal]
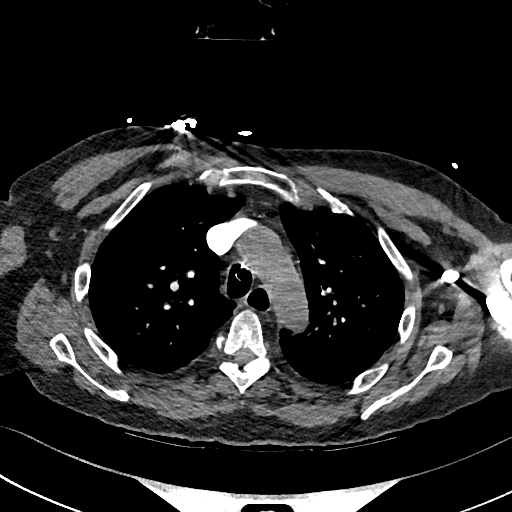
[im 521/608  lung]
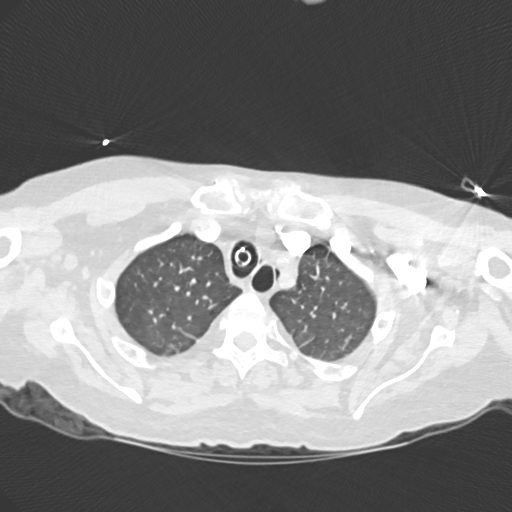
[im 550/608  mediastinal]
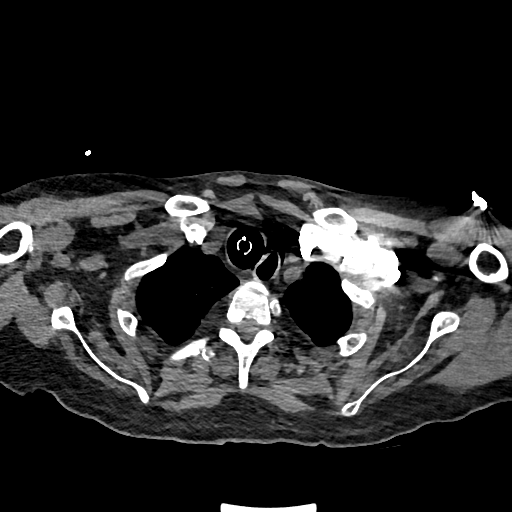
[im 579/608  lung]
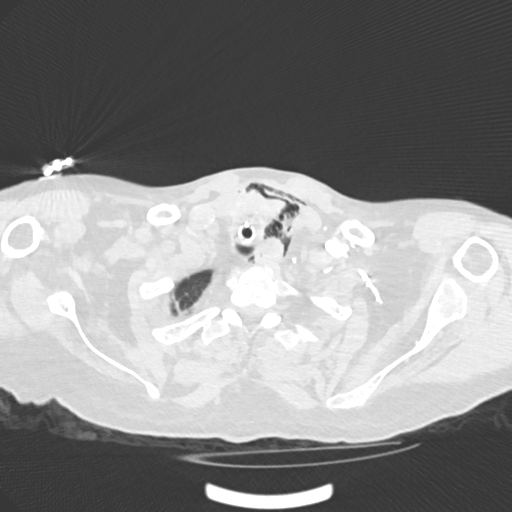

[Series 7: pe 2mm cor · coronal · 0.59mm/px · 1 of 131 slices shown]
[im 66/131  mediastinal]
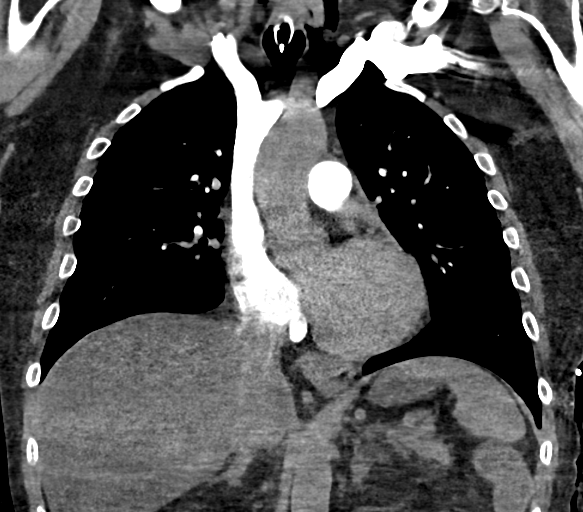

[18 of 36 positions shown; findings below may reference images not displayed]

FINDINGS: There is no demonstrable pulmonary embolus. There is no appreciable
thoracic aortic aneurysm. There is patchy atherosclerotic disease in
the aortic arch region. Visualized great vessels show mild
atherosclerotic change.

There is extensive soft tissue air in the visualized lower neck
tracking into the left superior mediastinum. There is an
endotracheal tube present. Air is seen tracking slightly superior to
the endotracheal tube in the lower neck region at the level of the
thyroid, raising concern for potential tracheal injury.

There is bibasilar atelectatic change. Areas of patchy ground-glass
opacity bilaterally most likely represent early pulmonary edema. No
pneumothorax.

Visualized thyroid appears unremarkable. No adenopathy is
appreciable. There are scattered foci of coronary artery
calcification. Pericardium is not thickened.

In the visualized upper abdomen, there are no appreciable lesions.

There are no blastic or lytic bone lesions. There is degenerative
change in the thoracic spine.

Review of the MIP images confirms the above findings.
IMPRESSION: There is extensive air in the neck and upper mediastinal region. Air
is noted just anterior to the trachea in the lower neck region.
Given the history and the presence of the endotracheal tube,
question traumatic intubation with tracheal injury.

No demonstrable pulmonary embolus.  No pneumothorax.

Evidence of early pulmonary edema.  Bibasilar atelectatic change.

No adenopathy.

Critical Value/emergent results were called by telephone at the time
of interpretation on 07/08/2015 at [DATE] to Dr. NOMASIBULELE MOATSHE , who
verbally acknowledged these results.

## 2017-04-18 DIAGNOSIS — M1A09X1 Idiopathic chronic gout, multiple sites, with tophus (tophi): Secondary | ICD-10-CM | POA: Diagnosis not present

## 2017-04-18 DIAGNOSIS — N189 Chronic kidney disease, unspecified: Secondary | ICD-10-CM | POA: Diagnosis not present

## 2017-04-18 DIAGNOSIS — M150 Primary generalized (osteo)arthritis: Secondary | ICD-10-CM | POA: Diagnosis not present

## 2017-04-18 DIAGNOSIS — Z79899 Other long term (current) drug therapy: Secondary | ICD-10-CM | POA: Diagnosis not present

## 2017-04-25 DIAGNOSIS — K852 Alcohol induced acute pancreatitis without necrosis or infection: Secondary | ICD-10-CM | POA: Diagnosis not present

## 2017-04-25 DIAGNOSIS — Z7901 Long term (current) use of anticoagulants: Secondary | ICD-10-CM | POA: Diagnosis not present

## 2017-04-25 DIAGNOSIS — I10 Essential (primary) hypertension: Secondary | ICD-10-CM | POA: Diagnosis not present

## 2017-04-25 DIAGNOSIS — R001 Bradycardia, unspecified: Secondary | ICD-10-CM | POA: Diagnosis not present

## 2017-07-25 DIAGNOSIS — I10 Essential (primary) hypertension: Secondary | ICD-10-CM | POA: Diagnosis not present

## 2017-07-25 DIAGNOSIS — N183 Chronic kidney disease, stage 3 (moderate): Secondary | ICD-10-CM | POA: Diagnosis not present

## 2017-08-04 DIAGNOSIS — M19012 Primary osteoarthritis, left shoulder: Secondary | ICD-10-CM | POA: Diagnosis not present

## 2017-08-04 DIAGNOSIS — M170 Bilateral primary osteoarthritis of knee: Secondary | ICD-10-CM | POA: Diagnosis not present

## 2017-08-04 DIAGNOSIS — Z9181 History of falling: Secondary | ICD-10-CM | POA: Diagnosis not present

## 2017-08-04 DIAGNOSIS — M19011 Primary osteoarthritis, right shoulder: Secondary | ICD-10-CM | POA: Diagnosis not present

## 2017-08-12 DIAGNOSIS — Z9181 History of falling: Secondary | ICD-10-CM | POA: Diagnosis not present

## 2017-08-12 DIAGNOSIS — M19011 Primary osteoarthritis, right shoulder: Secondary | ICD-10-CM | POA: Diagnosis not present

## 2017-08-12 DIAGNOSIS — M170 Bilateral primary osteoarthritis of knee: Secondary | ICD-10-CM | POA: Diagnosis not present

## 2017-08-12 DIAGNOSIS — M19012 Primary osteoarthritis, left shoulder: Secondary | ICD-10-CM | POA: Diagnosis not present

## 2017-08-17 DIAGNOSIS — M19011 Primary osteoarthritis, right shoulder: Secondary | ICD-10-CM | POA: Diagnosis not present

## 2017-08-17 DIAGNOSIS — M170 Bilateral primary osteoarthritis of knee: Secondary | ICD-10-CM | POA: Diagnosis not present

## 2017-08-17 DIAGNOSIS — Z9181 History of falling: Secondary | ICD-10-CM | POA: Diagnosis not present

## 2017-08-17 DIAGNOSIS — M19012 Primary osteoarthritis, left shoulder: Secondary | ICD-10-CM | POA: Diagnosis not present

## 2017-08-23 DIAGNOSIS — Z9181 History of falling: Secondary | ICD-10-CM | POA: Diagnosis not present

## 2017-08-23 DIAGNOSIS — M170 Bilateral primary osteoarthritis of knee: Secondary | ICD-10-CM | POA: Diagnosis not present

## 2017-08-23 DIAGNOSIS — M19012 Primary osteoarthritis, left shoulder: Secondary | ICD-10-CM | POA: Diagnosis not present

## 2017-08-23 DIAGNOSIS — M19011 Primary osteoarthritis, right shoulder: Secondary | ICD-10-CM | POA: Diagnosis not present

## 2017-11-15 DIAGNOSIS — N189 Chronic kidney disease, unspecified: Secondary | ICD-10-CM | POA: Diagnosis not present

## 2017-11-15 DIAGNOSIS — Z79899 Other long term (current) drug therapy: Secondary | ICD-10-CM | POA: Diagnosis not present

## 2017-11-15 DIAGNOSIS — M150 Primary generalized (osteo)arthritis: Secondary | ICD-10-CM | POA: Diagnosis not present

## 2017-11-15 DIAGNOSIS — M1A09X1 Idiopathic chronic gout, multiple sites, with tophus (tophi): Secondary | ICD-10-CM | POA: Diagnosis not present

## 2017-11-15 DIAGNOSIS — M25511 Pain in right shoulder: Secondary | ICD-10-CM | POA: Diagnosis not present

## 2017-11-22 DIAGNOSIS — I10 Essential (primary) hypertension: Secondary | ICD-10-CM | POA: Diagnosis not present

## 2017-11-22 DIAGNOSIS — E785 Hyperlipidemia, unspecified: Secondary | ICD-10-CM | POA: Diagnosis not present

## 2017-11-22 DIAGNOSIS — N183 Chronic kidney disease, stage 3 (moderate): Secondary | ICD-10-CM | POA: Diagnosis not present

## 2017-11-22 DIAGNOSIS — I5042 Chronic combined systolic (congestive) and diastolic (congestive) heart failure: Secondary | ICD-10-CM | POA: Diagnosis not present

## 2017-12-14 DIAGNOSIS — R9431 Abnormal electrocardiogram [ECG] [EKG]: Secondary | ICD-10-CM | POA: Diagnosis not present

## 2017-12-14 DIAGNOSIS — E785 Hyperlipidemia, unspecified: Secondary | ICD-10-CM | POA: Diagnosis not present

## 2018-02-21 DIAGNOSIS — I10 Essential (primary) hypertension: Secondary | ICD-10-CM | POA: Diagnosis not present

## 2018-02-21 DIAGNOSIS — Z7901 Long term (current) use of anticoagulants: Secondary | ICD-10-CM | POA: Diagnosis not present

## 2018-03-29 DIAGNOSIS — H25812 Combined forms of age-related cataract, left eye: Secondary | ICD-10-CM | POA: Diagnosis not present

## 2018-03-29 DIAGNOSIS — H25811 Combined forms of age-related cataract, right eye: Secondary | ICD-10-CM | POA: Diagnosis not present

## 2018-03-29 DIAGNOSIS — H401131 Primary open-angle glaucoma, bilateral, mild stage: Secondary | ICD-10-CM | POA: Diagnosis not present

## 2018-04-27 ENCOUNTER — Other Ambulatory Visit: Payer: Self-pay

## 2018-05-16 DIAGNOSIS — M1A09X1 Idiopathic chronic gout, multiple sites, with tophus (tophi): Secondary | ICD-10-CM | POA: Diagnosis not present

## 2018-05-16 DIAGNOSIS — N189 Chronic kidney disease, unspecified: Secondary | ICD-10-CM | POA: Diagnosis not present

## 2018-05-16 DIAGNOSIS — Z79899 Other long term (current) drug therapy: Secondary | ICD-10-CM | POA: Diagnosis not present

## 2018-05-16 DIAGNOSIS — M150 Primary generalized (osteo)arthritis: Secondary | ICD-10-CM | POA: Diagnosis not present

## 2018-06-07 ENCOUNTER — Emergency Department (HOSPITAL_COMMUNITY): Payer: Medicare Other

## 2018-06-07 ENCOUNTER — Inpatient Hospital Stay (HOSPITAL_COMMUNITY): Payer: Medicare Other

## 2018-06-07 ENCOUNTER — Encounter (HOSPITAL_COMMUNITY): Payer: Self-pay | Admitting: Radiology

## 2018-06-07 ENCOUNTER — Inpatient Hospital Stay (HOSPITAL_COMMUNITY)
Admission: EM | Admit: 2018-06-07 | Discharge: 2018-06-14 | DRG: 897 | Disposition: A | Discharge status: Skilled Nursing Facility | Payer: Medicare Other | Attending: Family Medicine | Admitting: Family Medicine

## 2018-06-07 DIAGNOSIS — R4189 Other symptoms and signs involving cognitive functions and awareness: Secondary | ICD-10-CM | POA: Diagnosis not present

## 2018-06-07 DIAGNOSIS — Z86718 Personal history of other venous thrombosis and embolism: Secondary | ICD-10-CM | POA: Diagnosis not present

## 2018-06-07 DIAGNOSIS — Z888 Allergy status to other drugs, medicaments and biological substances status: Secondary | ICD-10-CM

## 2018-06-07 DIAGNOSIS — I6930 Unspecified sequelae of cerebral infarction: Secondary | ICD-10-CM

## 2018-06-07 DIAGNOSIS — Y900 Blood alcohol level of less than 20 mg/100 ml: Secondary | ICD-10-CM | POA: Diagnosis present

## 2018-06-07 DIAGNOSIS — F04 Amnestic disorder due to known physiological condition: Secondary | ICD-10-CM | POA: Diagnosis present

## 2018-06-07 DIAGNOSIS — R319 Hematuria, unspecified: Secondary | ICD-10-CM | POA: Diagnosis present

## 2018-06-07 DIAGNOSIS — R404 Transient alteration of awareness: Secondary | ICD-10-CM | POA: Diagnosis not present

## 2018-06-07 DIAGNOSIS — N400 Enlarged prostate without lower urinary tract symptoms: Secondary | ICD-10-CM | POA: Diagnosis present

## 2018-06-07 DIAGNOSIS — Z9114 Patient's other noncompliance with medication regimen: Secondary | ICD-10-CM

## 2018-06-07 DIAGNOSIS — Z7901 Long term (current) use of anticoagulants: Secondary | ICD-10-CM | POA: Diagnosis not present

## 2018-06-07 DIAGNOSIS — I639 Cerebral infarction, unspecified: Secondary | ICD-10-CM | POA: Diagnosis present

## 2018-06-07 DIAGNOSIS — Z79899 Other long term (current) drug therapy: Secondary | ICD-10-CM | POA: Diagnosis not present

## 2018-06-07 DIAGNOSIS — Z8674 Personal history of sudden cardiac arrest: Secondary | ICD-10-CM

## 2018-06-07 DIAGNOSIS — G40509 Epileptic seizures related to external causes, not intractable, without status epilepticus: Secondary | ICD-10-CM | POA: Diagnosis present

## 2018-06-07 DIAGNOSIS — R569 Unspecified convulsions: Secondary | ICD-10-CM

## 2018-06-07 DIAGNOSIS — N179 Acute kidney failure, unspecified: Secondary | ICD-10-CM | POA: Diagnosis present

## 2018-06-07 DIAGNOSIS — R4020 Unspecified coma: Secondary | ICD-10-CM | POA: Diagnosis not present

## 2018-06-07 DIAGNOSIS — E872 Acidosis: Secondary | ICD-10-CM | POA: Diagnosis not present

## 2018-06-07 DIAGNOSIS — F10251 Alcohol dependence with alcohol-induced psychotic disorder with hallucinations: Principal | ICD-10-CM | POA: Diagnosis present

## 2018-06-07 DIAGNOSIS — I6300 Cerebral infarction due to thrombosis of unspecified precerebral artery: Secondary | ICD-10-CM | POA: Diagnosis not present

## 2018-06-07 DIAGNOSIS — F1010 Alcohol abuse, uncomplicated: Secondary | ICD-10-CM | POA: Diagnosis not present

## 2018-06-07 DIAGNOSIS — I5022 Chronic systolic (congestive) heart failure: Secondary | ICD-10-CM | POA: Diagnosis present

## 2018-06-07 DIAGNOSIS — E512 Wernicke's encephalopathy: Secondary | ICD-10-CM | POA: Diagnosis not present

## 2018-06-07 DIAGNOSIS — F1210 Cannabis abuse, uncomplicated: Secondary | ICD-10-CM | POA: Diagnosis present

## 2018-06-07 DIAGNOSIS — I130 Hypertensive heart and chronic kidney disease with heart failure and stage 1 through stage 4 chronic kidney disease, or unspecified chronic kidney disease: Secondary | ICD-10-CM | POA: Diagnosis present

## 2018-06-07 DIAGNOSIS — R279 Unspecified lack of coordination: Secondary | ICD-10-CM | POA: Diagnosis not present

## 2018-06-07 DIAGNOSIS — I4581 Long QT syndrome: Secondary | ICD-10-CM | POA: Diagnosis present

## 2018-06-07 DIAGNOSIS — M109 Gout, unspecified: Secondary | ICD-10-CM | POA: Diagnosis present

## 2018-06-07 DIAGNOSIS — F05 Delirium due to known physiological condition: Secondary | ICD-10-CM | POA: Diagnosis not present

## 2018-06-07 DIAGNOSIS — R441 Visual hallucinations: Secondary | ICD-10-CM

## 2018-06-07 DIAGNOSIS — R55 Syncope and collapse: Secondary | ICD-10-CM | POA: Diagnosis not present

## 2018-06-07 DIAGNOSIS — I351 Nonrheumatic aortic (valve) insufficiency: Secondary | ICD-10-CM | POA: Diagnosis not present

## 2018-06-07 DIAGNOSIS — E875 Hyperkalemia: Secondary | ICD-10-CM | POA: Diagnosis present

## 2018-06-07 DIAGNOSIS — G459 Transient cerebral ischemic attack, unspecified: Secondary | ICD-10-CM | POA: Diagnosis not present

## 2018-06-07 DIAGNOSIS — E785 Hyperlipidemia, unspecified: Secondary | ICD-10-CM | POA: Diagnosis present

## 2018-06-07 DIAGNOSIS — E7800 Pure hypercholesterolemia, unspecified: Secondary | ICD-10-CM | POA: Diagnosis not present

## 2018-06-07 DIAGNOSIS — I5043 Acute on chronic combined systolic (congestive) and diastolic (congestive) heart failure: Secondary | ICD-10-CM | POA: Diagnosis not present

## 2018-06-07 DIAGNOSIS — G40901 Epilepsy, unspecified, not intractable, with status epilepticus: Secondary | ICD-10-CM | POA: Diagnosis not present

## 2018-06-07 DIAGNOSIS — F10231 Alcohol dependence with withdrawal delirium: Secondary | ICD-10-CM | POA: Diagnosis present

## 2018-06-07 DIAGNOSIS — N183 Chronic kidney disease, stage 3 (moderate): Secondary | ICD-10-CM | POA: Diagnosis present

## 2018-06-07 DIAGNOSIS — D638 Anemia in other chronic diseases classified elsewhere: Secondary | ICD-10-CM | POA: Diagnosis present

## 2018-06-07 DIAGNOSIS — F17290 Nicotine dependence, other tobacco product, uncomplicated: Secondary | ICD-10-CM | POA: Diagnosis present

## 2018-06-07 DIAGNOSIS — I10 Essential (primary) hypertension: Secondary | ICD-10-CM | POA: Diagnosis present

## 2018-06-07 DIAGNOSIS — R29704 NIHSS score 4: Secondary | ICD-10-CM | POA: Diagnosis present

## 2018-06-07 DIAGNOSIS — R410 Disorientation, unspecified: Secondary | ICD-10-CM | POA: Diagnosis not present

## 2018-06-07 DIAGNOSIS — N281 Cyst of kidney, acquired: Secondary | ICD-10-CM | POA: Diagnosis not present

## 2018-06-07 DIAGNOSIS — Z743 Need for continuous supervision: Secondary | ICD-10-CM | POA: Diagnosis not present

## 2018-06-07 DIAGNOSIS — I160 Hypertensive urgency: Secondary | ICD-10-CM | POA: Diagnosis not present

## 2018-06-07 LAB — RAPID URINE DRUG SCREEN, HOSP PERFORMED
AMPHETAMINES: NOT DETECTED
Barbiturates: NOT DETECTED
Benzodiazepines: POSITIVE — AB
COCAINE: NOT DETECTED
OPIATES: NOT DETECTED
Tetrahydrocannabinol: POSITIVE — AB

## 2018-06-07 LAB — COMPREHENSIVE METABOLIC PANEL
ALBUMIN: 3.2 g/dL — AB (ref 3.5–5.0)
ALK PHOS: 92 U/L (ref 38–126)
ALT: 11 U/L (ref 0–44)
ANION GAP: 19 — AB (ref 5–15)
AST: 44 U/L — ABNORMAL HIGH (ref 15–41)
BILIRUBIN TOTAL: 1 mg/dL (ref 0.3–1.2)
BUN: 10 mg/dL (ref 8–23)
CALCIUM: 8.4 mg/dL — AB (ref 8.9–10.3)
CO2: 15 mmol/L — ABNORMAL LOW (ref 22–32)
CREATININE: 2.58 mg/dL — AB (ref 0.61–1.24)
Chloride: 108 mmol/L (ref 98–111)
GFR calc Af Amer: 28 mL/min — ABNORMAL LOW (ref >60)
GFR calc non Af Amer: 24 mL/min — ABNORMAL LOW (ref >60)
GLUCOSE: 133 mg/dL — AB (ref 70–99)
Potassium: 4.2 mmol/L (ref 3.5–5.1)
Sodium: 142 mmol/L (ref 135–145)
TOTAL PROTEIN: 7.1 g/dL (ref 6.5–8.1)

## 2018-06-07 LAB — CBC WITH DIFFERENTIAL/PLATELET
Abs Immature Granulocytes: 0 10*3/uL (ref 0.0–0.1)
Basophils Absolute: 0 10*3/uL (ref 0.0–0.1)
Basophils Relative: 1 %
Eosinophils Absolute: 0.1 10*3/uL (ref 0.0–0.7)
Eosinophils Relative: 1 %
HEMATOCRIT: 42 % (ref 39.0–52.0)
HEMOGLOBIN: 12.8 g/dL — AB (ref 13.0–17.0)
IMMATURE GRANULOCYTES: 1 %
LYMPHS ABS: 0.8 10*3/uL (ref 0.7–4.0)
LYMPHS PCT: 14 %
MCH: 30.8 pg (ref 26.0–34.0)
MCHC: 30.5 g/dL (ref 30.0–36.0)
MCV: 101 fL — ABNORMAL HIGH (ref 78.0–100.0)
MONOS PCT: 8 %
Monocytes Absolute: 0.5 10*3/uL (ref 0.1–1.0)
NEUTROS PCT: 77 %
Neutro Abs: 4.5 10*3/uL (ref 1.7–7.7)
Platelets: 164 10*3/uL (ref 150–400)
RBC: 4.16 MIL/uL — AB (ref 4.22–5.81)
RDW: 14.4 % (ref 11.5–15.5)
WBC: 5.9 10*3/uL (ref 4.0–10.5)

## 2018-06-07 LAB — I-STAT CG4 LACTIC ACID, ED
LACTIC ACID, VENOUS: 13 mmol/L — AB (ref 0.5–1.9)
Lactic Acid, Venous: 3.16 mmol/L (ref 0.5–1.9)

## 2018-06-07 LAB — URINALYSIS, ROUTINE W REFLEX MICROSCOPIC
BACTERIA UA: NONE SEEN
Bilirubin Urine: NEGATIVE
Glucose, UA: NEGATIVE mg/dL
KETONES UR: NEGATIVE mg/dL
Leukocytes, UA: NEGATIVE
Nitrite: NEGATIVE
PH: 5 (ref 5.0–8.0)
Protein, ur: 100 mg/dL — AB
RBC / HPF: >50 RBC/hpf — ABNORMAL HIGH (ref 0–5)
SPECIFIC GRAVITY, URINE: 1.012 (ref 1.005–1.030)

## 2018-06-07 LAB — I-STAT TROPONIN, ED: TROPONIN I, POC: 0 ng/mL (ref 0.00–0.08)

## 2018-06-07 LAB — I-STAT CHEM 8, ED
BUN: 15 mg/dL (ref 8–23)
Calcium, Ion: 1.08 mmol/L — ABNORMAL LOW (ref 1.15–1.40)
Chloride: 110 mmol/L (ref 98–111)
Creatinine, Ser: 2.4 mg/dL — ABNORMAL HIGH (ref 0.61–1.24)
Glucose, Bld: 128 mg/dL — ABNORMAL HIGH (ref 70–99)
HEMATOCRIT: 42 % (ref 39.0–52.0)
Hemoglobin: 14.3 g/dL (ref 13.0–17.0)
POTASSIUM: 4.9 mmol/L (ref 3.5–5.1)
SODIUM: 140 mmol/L (ref 135–145)
TCO2: 16 mmol/L — AB (ref 22–32)

## 2018-06-07 LAB — ETHANOL

## 2018-06-07 LAB — CK: Total CK: 188 U/L (ref 49–397)

## 2018-06-07 LAB — CBG MONITORING, ED: GLUCOSE-CAPILLARY: 120 mg/dL — AB (ref 70–99)

## 2018-06-07 MED ORDER — SENNOSIDES-DOCUSATE SODIUM 8.6-50 MG PO TABS
1.0000 | ORAL_TABLET | Freq: Every evening | ORAL | Status: DC | PRN
  Administered 2018-06-14: 1 via ORAL
  Filled 2018-06-07: qty 1

## 2018-06-07 MED ORDER — ACETAMINOPHEN 325 MG PO TABS
650.0000 mg | ORAL_TABLET | ORAL | Status: DC | PRN
  Administered 2018-06-11 – 2018-06-14 (×3): 650 mg via ORAL
  Filled 2018-06-07 (×3): qty 2

## 2018-06-07 MED ORDER — COLCHICINE 0.6 MG PO TABS
0.6000 mg | ORAL_TABLET | ORAL | Status: DC
  Administered 2018-06-12: 0.6 mg via ORAL
  Filled 2018-06-07: qty 1

## 2018-06-07 MED ORDER — VITAMIN B-1 100 MG PO TABS
100.0000 mg | ORAL_TABLET | Freq: Every day | ORAL | Status: DC
  Administered 2018-06-08: 100 mg via ORAL
  Filled 2018-06-07 (×2): qty 1

## 2018-06-07 MED ORDER — PANTOPRAZOLE SODIUM 40 MG PO TBEC
40.0000 mg | DELAYED_RELEASE_TABLET | Freq: Every day | ORAL | Status: DC
  Administered 2018-06-08 – 2018-06-14 (×6): 40 mg via ORAL
  Filled 2018-06-07 (×7): qty 1

## 2018-06-07 MED ORDER — POTASSIUM CHLORIDE CRYS ER 20 MEQ PO TBCR
30.0000 meq | EXTENDED_RELEASE_TABLET | Freq: Every day | ORAL | Status: DC
  Administered 2018-06-08: 30 meq via ORAL
  Filled 2018-06-07: qty 1

## 2018-06-07 MED ORDER — RIVAROXABAN 20 MG PO TABS
20.0000 mg | ORAL_TABLET | Freq: Every day | ORAL | Status: DC
  Administered 2018-06-08: 20 mg via ORAL
  Filled 2018-06-07 (×2): qty 1

## 2018-06-07 MED ORDER — SODIUM CHLORIDE 0.9 % IV BOLUS
1000.0000 mL | Freq: Once | INTRAVENOUS | Status: AC
  Administered 2018-06-07: 1000 mL via INTRAVENOUS

## 2018-06-07 MED ORDER — STROKE: EARLY STAGES OF RECOVERY BOOK
Freq: Once | Status: AC
  Administered 2018-06-08: 07:00:00
  Filled 2018-06-07: qty 1

## 2018-06-07 MED ORDER — LEVETIRACETAM IN NACL 1000 MG/100ML IV SOLN
1000.0000 mg | Freq: Once | INTRAVENOUS | Status: AC
  Administered 2018-06-07: 1000 mg via INTRAVENOUS
  Filled 2018-06-07: qty 100

## 2018-06-07 MED ORDER — METOPROLOL TARTRATE 50 MG PO TABS
50.0000 mg | ORAL_TABLET | Freq: Two times a day (BID) | ORAL | Status: DC
  Administered 2018-06-08 – 2018-06-12 (×10): 50 mg via ORAL
  Filled 2018-06-07 (×11): qty 1

## 2018-06-07 MED ORDER — VANCOMYCIN HCL IN DEXTROSE 1-5 GM/200ML-% IV SOLN
1000.0000 mg | Freq: Once | INTRAVENOUS | Status: AC
  Administered 2018-06-07: 1000 mg via INTRAVENOUS
  Filled 2018-06-07: qty 200

## 2018-06-07 MED ORDER — SODIUM CHLORIDE 0.9 % IV SOLN
INTRAVENOUS | Status: DC
  Administered 2018-06-08 (×2): via INTRAVENOUS

## 2018-06-07 MED ORDER — FOLIC ACID 1 MG PO TABS
1.0000 mg | ORAL_TABLET | Freq: Every day | ORAL | Status: DC
  Administered 2018-06-08 – 2018-06-14 (×6): 1 mg via ORAL
  Filled 2018-06-07 (×7): qty 1

## 2018-06-07 MED ORDER — ACETAMINOPHEN 160 MG/5ML PO SOLN: 650.0000 mg | ORAL | Status: DC | PRN

## 2018-06-07 MED ORDER — ASPIRIN 300 MG RE SUPP
300.0000 mg | Freq: Every day | RECTAL | Status: DC
  Filled 2018-06-07: qty 1

## 2018-06-07 MED ORDER — ACETAMINOPHEN 325 MG PO TABS: 650.0000 mg | ORAL_TABLET | Freq: Four times a day (QID) | ORAL | Status: DC | PRN

## 2018-06-07 MED ORDER — FUROSEMIDE 20 MG PO TABS
20.0000 mg | ORAL_TABLET | Freq: Every day | ORAL | Status: DC
  Administered 2018-06-08: 20 mg via ORAL
  Filled 2018-06-07: qty 1

## 2018-06-07 MED ORDER — ATORVASTATIN CALCIUM 40 MG PO TABS
40.0000 mg | ORAL_TABLET | Freq: Every day | ORAL | Status: DC
  Administered 2018-06-08 – 2018-06-13 (×5): 40 mg via ORAL
  Filled 2018-06-07 (×6): qty 1

## 2018-06-07 MED ORDER — ASPIRIN 325 MG PO TABS
325.0000 mg | ORAL_TABLET | Freq: Every day | ORAL | Status: DC
  Administered 2018-06-08: 325 mg via ORAL
  Filled 2018-06-07 (×2): qty 1

## 2018-06-07 MED ORDER — PIPERACILLIN-TAZOBACTAM 3.375 G IVPB 30 MIN
3.3750 g | Freq: Once | INTRAVENOUS | Status: AC
  Administered 2018-06-07: 3.375 g via INTRAVENOUS
  Filled 2018-06-07: qty 50

## 2018-06-07 MED ORDER — PROSIGHT PO TABS
1.0000 | ORAL_TABLET | Freq: Every day | ORAL | Status: DC
  Administered 2018-06-08 – 2018-06-14 (×6): 1 via ORAL
  Filled 2018-06-07 (×7): qty 1

## 2018-06-07 MED ORDER — FEBUXOSTAT 40 MG PO TABS
80.0000 mg | ORAL_TABLET | Freq: Every day | ORAL | Status: DC
  Administered 2018-06-08 – 2018-06-14 (×6): 80 mg via ORAL
  Filled 2018-06-07 (×7): qty 2

## 2018-06-07 MED ORDER — ACETAMINOPHEN 650 MG RE SUPP: 650.0000 mg | RECTAL | Status: DC | PRN

## 2018-06-07 NOTE — H&P (Signed)
History and Physical   Anmol Fleck ZOX:[id number] DOB: 1951-04-20 DOA: 06/07/2018  Referring MD/NP/PA: Dr. Roderic Palau  PCP: [name], MD   Patient coming from: Home  Chief Complaint: Seizures  HPI: Mitchell [name] is a 67 y.o. 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  He was suspected to have had a seizure disorder also probably due to alcohol withdrawal.  Patient was last seen normal at 3:30 AM around 4 AM his wife found him unresponsive.  She called paramedics who came by and try to bring patient into the hospital.  In route he had seizure and was given 2.5 mg of Versed.  He has not had any more seizures in the ER.  Patient remained confused.  Work-up was initiated with CT head suggesting acute to subacute CVA.  Patient is suspected therefore to have had a combination of acute CVA and possible seizure from alcohol withdrawal.  No prior history of seizure disorder.  Neurology has been consulted and recommendation is to admit patient for CVA work-up.  ED Course: Patient's temperature is 99.4 blood pressure 151/68 pulse 42-94 respiratory rate of 26 oxygen sat initially 79% on room air currently 100% 2 L.  His white count is 5.9 hemoglobin 14.3 platelet is 164.  CO2 of 15 with creatinine of 2.40 lactic acid 13.00 and glucose 128.  Troponin so far negative.  Chest x-ray shows no active disease.  Head CT without contrast shows Hypodensity within the high right parietal lobe, consistent with acute to subacute infarct. No hemorrhage.  Neurology has been consulted and patient is getting MRI and possibly EEG tomorrow.  Review of Systems: As per HPI otherwise 10 point review of systems negative.    Past Medical History:  Diagnosis Date  . Alcohol abuse   . Gout   . Hyperlipemia   . Hypertension     Past Surgical  History:  Procedure Laterality Date  . RADIOLOGY WITH ANESTHESIA N/A 07/17/2015   Procedure: RADIOLOGY WITH ANESTHESIA;  Surgeon: Medication Radiologist, MD;  Location: [place];  Service: Radiology;  Laterality: N/A;     reports that he has been smoking cigars. He has never used smokeless tobacco. He reports that he does not drink alcohol or use drugs.  Allergies  Allergen Reactions  . Hydrochlorothiazide     REACTION: precipitates gout  . Pseudoephedrine     REACTION: rash    Family History  Problem Relation Age of Onset  . Aneurysm Sister   . Clotting disorder Brother   . Aneurysm Mother      Prior to Admission medications   Medication Sig Start Date End Date Taking? Authorizing Provider  acetaminophen (TYLENOL) 325 MG tablet Take 2 tablets (650 mg total) by mouth every 6 (six) hours as needed for mild pain, moderate pain or fever (or Fever >/= 101). 03/09/17  Yes Hongalgi, Lenis Dickinson, MD  COLCRYS 0.6 MG tablet Take 0.6 mg by mouth 2 (two) times a week.  01/29/17  Yes [provider]  folic acid (FOLVITE) 1 MG tablet Take 1 tablet (1 mg total) by mouth daily. 07/19/15  Yes Allie Bossier, MD  furosemide (LASIX) 20 MG tablet Take 20 mg by mouth daily. 05/12/18  Yes [provider]  metoprolol tartrate (LOPRESSOR) 50 MG tablet Take 1 tablet (50 mg total) by mouth 2 (two) times daily. 03/09/17  Yes Hongalgi, Lenis Dickinson, MD  multivitamin (PROSIGHT) TABS tablet Take 1 tablet by mouth daily. 07/19/15  Yes Allie Bossier, MD  pantoprazole (PROTONIX) 40 MG tablet Take 1 tablet (40 mg total) by mouth daily. 03/09/17  Yes Hongalgi, Lenis Dickinson, MD  potassium chloride (KLOR-CON M15) 15 MEQ tablet Take 2 tablets (30 mEq total) by mouth daily. 07/27/15  Yes Cherene Altes, MD  rivaroxaban (XARELTO) 20 MG TABS tablet Take 1 tablet (20 mg total) by mouth daily with supper. Patient taking differently: Take 20 mg by mouth daily.  08/14/15  Yes Cherene Altes, MD  thiamine 100 MG tablet  Take 1 tablet (100 mg total) by mouth daily. 03/09/17  Yes Hongalgi, Lenis Dickinson, MD  ULORIC 80 MG TABS Take 1 tablet by mouth daily.  12/28/16  Yes [provider]    Physical Exam: Vitals:   06/07/18 1945 06/07/18 2100 06/07/18 2130 06/07/18 2237  BP: 135/64 135/64  (!) 151/68  Pulse: (!) 56 69  67  Resp: 14 19    Temp:   99.4 F (37.4 C) 98.7 F (37.1 C)  TempSrc:    Oral  SpO2: 98% 100%  99%  Weight:    80.9 kg  Height:    5\' 8"  (1.727 m)      Constitutional: NAD, calm, comfortable Vitals:   06/07/18 1945 06/07/18 2100 06/07/18 2130 06/07/18 2237  BP: 135/64 135/64  (!) 151/68  Pulse: (!) 56 69  67  Resp: 14 19    Temp:   99.4 F (37.4 C) 98.7 F (37.1 C)  TempSrc:    Oral  SpO2: 98% 100%  99%  Weight:    80.9 kg  Height:    5\' 8"  (1.727 m)    Patient slightly confused but moving all limbs. Eyes: PERRL, lids and conjunctivae normal ENMT: Mucous membranes are moist. Posterior pharynx clear of any exudate or lesions.Normal dentition.  Neck: normal, supple, no masses, no thyromegaly Respiratory: clear to auscultation bilaterally, no wheezing, no crackles. Normal respiratory effort. No accessory muscle use.  Cardiovascular: Regular rate and rhythm, no murmurs / rubs / gallops. No extremity edema. 2+ pedal pulses. No carotid bruits.  Abdomen: no tenderness, no masses palpated. No hepatosplenomegaly. Bowel sounds positive.  Musculoskeletal: no clubbing / cyanosis. No joint deformity upper and lower extremities. Good ROM, no contractures. Normal muscle tone.  Skin: no rashes, lesions, ulcers. No induration Neurologic: CN 2-12 grossly intact. Sensation intact, DTR normal. Strength 5/5 in all 4.  Psychiatric: Normal judgment and insight. Alert and oriented x 3. Normal mood.     Labs on Admission: I have personally reviewed following labs and imaging studies  CBC: Recent Labs  Lab 06/07/18 1645 06/07/18 1655  WBC 5.9  --   NEUTROABS 4.5  --   HGB 12.8* 14.3    HCT 42.0 42.0  MCV 101.0*  --   PLT 164  --    Basic Metabolic Panel: Recent Labs  Lab 06/07/18 1645 06/07/18 1655  NA 142 140  K 4.2 4.9  CL 108 110  CO2 15*  --   GLUCOSE 133* 128*  BUN 10 15  CREATININE 2.58* 2.40*  CALCIUM 8.4*  --    GFR: Estimated Creatinine Clearance: 28.5 mL/min (A) (by C-G formula based on SCr of 2.4 mg/dL (H)). Liver Function Tests: Recent Labs  Lab 06/07/18 1645  AST 44*  ALT 11  ALKPHOS 92  BILITOT 1.0  PROT 7.1  ALBUMIN 3.2*   No  results for input(s): LIPASE, AMYLASE in the last 168 hours. No results for input(s): AMMONIA in the last 168 hours. Coagulation Profile: No results for input(s): INR, PROTIME in the last 168 hours. Cardiac Enzymes: Recent Labs  Lab 06/07/18 1645  CKTOTAL 188   BNP (last 3 results) No results for input(s): PROBNP in the last 8760 hours. HbA1C: No results for input(s): HGBA1C in the last 72 hours. CBG: Recent Labs  Lab 06/07/18 1621  GLUCAP 120*   Lipid Profile: No results for input(s): CHOL, HDL, LDLCALC, TRIG, CHOLHDL, LDLDIRECT in the last 72 hours. Thyroid Function Tests: No results for input(s): TSH, T4TOTAL, FREET4, T3FREE, THYROIDAB in the last 72 hours. Anemia Panel: No results for input(s): VITAMINB12, FOLATE, FERRITIN, TIBC, IRON, RETICCTPCT in the last 72 hours. Urine analysis:    Component Value Date/Time   COLORURINE YELLOW 06/07/2018 1855   APPEARANCEUR HAZY (A) 06/07/2018 1855   LABSPEC 1.012 06/07/2018 1855   PHURINE 5.0 06/07/2018 1855   GLUCOSEU NEGATIVE 06/07/2018 1855   HGBUR LARGE (A) 06/07/2018 1855   BILIRUBINUR NEGATIVE 06/07/2018 1855   KETONESUR NEGATIVE 06/07/2018 1855   PROTEINUR 100 (A) 06/07/2018 1855   UROBILINOGEN 1.0 07/19/2015 1739   NITRITE NEGATIVE 06/07/2018 1855   LEUKOCYTESUR NEGATIVE 06/07/2018 1855   Sepsis Labs: @LABRCNTIP (procalcitonin:4,lacticidven:4) )No results found for this or any previous visit (from the past 240 hour(s)).    Radiological Exams on Admission: Ct Head Wo Contrast  Result Date: 06/07/2018 CLINICAL DATA:  Altered LOC, witnessed seizure EXAM: CT HEAD WITHOUT CONTRAST CT CERVICAL SPINE WITHOUT CONTRAST TECHNIQUE: Multidetector CT imaging of the head and cervical spine was performed following the standard protocol without intravenous contrast. Multiplanar CT image reconstructions of the cervical spine were also generated. COMPARISON:  MRI 07/17/2015, CT brain 07/08/2015 FINDINGS: CT HEAD FINDINGS Brain: Hypodensity with loss of the gray-white matter differentiation in the right high parietal lobe consistent with acute to subacute infarct. No hemorrhage. No mass. Mild atrophy. Stable ventricle size. Vascular: No hyperdense vessels.  Carotid vascular calcification Skull: Normal. Negative for fracture or focal lesion. Sinuses/Orbits: No acute finding. Mucous retention cyst in the left maxillary sinus. Other: None CT CERVICAL SPINE FINDINGS Alignment: Motion degraded study which limits evaluation for fracture. Facet alignment within normal limits. Skull base and vertebrae: Craniovertebral junction is intact. No obvious fracture allowing for motion Soft tissues and spinal canal: No prevertebral fluid or swelling. No visible canal hematoma. Disc levels: Moderate degenerative change at C3-C4, C5-C6 and C6-C7. Upper chest: Negative. Other: None IMPRESSION: 1. Hypodensity within the high right parietal lobe, consistent with acute to subacute infarct. No hemorrhage. Atrophy. 2. Motion degraded images of the cervical spine which limits evaluation for fracture. No definite acute osseous abnormality is seen. Electronically Signed   By: Donavan Foil M.D.   On: 06/07/2018 19:41   Ct Cervical Spine Wo Contrast  Result Date: 06/07/2018 CLINICAL DATA:  Altered LOC, witnessed seizure EXAM: CT HEAD WITHOUT CONTRAST CT CERVICAL SPINE WITHOUT CONTRAST TECHNIQUE: Multidetector CT imaging of the head and cervical spine was performed  following the standard protocol without intravenous contrast. Multiplanar CT image reconstructions of the cervical spine were also generated. COMPARISON:  MRI 07/17/2015, CT brain 07/08/2015 FINDINGS: CT HEAD FINDINGS Brain: Hypodensity with loss of the gray-white matter differentiation in the right high parietal lobe consistent with acute to subacute infarct. No hemorrhage. No mass. Mild atrophy. Stable ventricle size. Vascular: No hyperdense vessels.  Carotid vascular calcification Skull: Normal. Negative for fracture or focal lesion. Sinuses/Orbits: No  acute finding. Mucous retention cyst in the left maxillary sinus. Other: None CT CERVICAL SPINE FINDINGS Alignment: Motion degraded study which limits evaluation for fracture. Facet alignment within normal limits. Skull base and vertebrae: Craniovertebral junction is intact. No obvious fracture allowing for motion Soft tissues and spinal canal: No prevertebral fluid or swelling. No visible canal hematoma. Disc levels: Moderate degenerative change at C3-C4, C5-C6 and C6-C7. Upper chest: Negative. Other: None IMPRESSION: 1. Hypodensity within the high right parietal lobe, consistent with acute to subacute infarct. No hemorrhage. Atrophy. 2. Motion degraded images of the cervical spine which limits evaluation for fracture. No definite acute osseous abnormality is seen. Electronically Signed   By: Donavan Foil M.D.   On: 06/07/2018 19:41   Dg Chest Port 1 View  Result Date: 06/07/2018 CLINICAL DATA:  Seizure today EXAM: PORTABLE CHEST 1 VIEW COMPARISON:  July 26, 2015 FINDINGS: The heart size and mediastinal contours are within normal limits. Aorta is tortuous both lungs are clear. The visualized skeletal structures are stable. IMPRESSION: No active cardiopulmonary disease. Electronically Signed   By: Abelardo Diesel M.D.   On: 06/07/2018 16:56    EKG: Independently reviewed.  Normal sinus rhythm with a rate of 94, mildly prolonged QT interval with Q waves and  early repolarization.  No significant ST change  Assessment/Plan Principal Problem:   CVA (cerebral vascular accident) ([place]) Active Problems:   Hyperlipidemia   Gout, unspecified   HYPERTENSION, BENIGN SYSTEMIC   Chronic systolic CHF (congestive heart failure) ([place])   Alcohol abuse     #1 acute to subacute CVA on CT scan: Patient will get MRI of the brain to confirm.  In the meantime we will start patient on aspirin and statin.  PT and OT consultation.  Neurology has been consulted.  Carotid Dopplers will be done in addition to echocardiogram.  #2 seizure: Suspected seizure probably alcohol withdrawal.  Monitor for possible DTs.  CIWA protocol at that point will be initiated.  #3 chronic systolic CHF: Previous echo in 2016 showed EF of 30 to 35%.  Echo has been ordered and will follow closely.  Continue his home regimen.  #4 chronic anticoagulation: Secondary to previous DVT with PE.  Continue home Xarelto  #5 hyperlipidemia: Patient initiated on statin.  #6 history of gout: On Colcrys and allopurinol.  Will maintain that and continue.  #7 history of alcohol abuse: Continue with thiamine and folic acid.  Watch patient for possible DTs.   DVT prophylaxis: Xarelto Code Status: Full code Family Communication: Wife who brought patient to the hospital Disposition Plan: To be determined Consults called: Neurology Dr. Cheral Marker Admission status: Inpatient  Severity of Illness: The appropriate patient status for this patient is INPATIENT. Inpatient status is judged to be reasonable and necessary in order to provide the required intensity of service to ensure the patient's safety. The patient's presenting symptoms, physical exam findings, and initial radiographic and laboratory data in the context of their chronic comorbidities is felt to place them at high risk for further clinical deterioration. Furthermore, it is not anticipated that the patient will be medically stable for discharge  from the hospital within 2 midnights of admission. The following factors support the patient status of inpatient.   " The patient's presenting symptoms include seizure and altered mental status. " The worrisome physical exam findings include confusion. " The initial radiographic and laboratory data are worrisome because of CT findings of possible acute CVA. " The chronic co-morbidities include alcoholism and previous CVA.   *  I certify that at the point of admission it is my clinical judgment that the patient will require inpatient hospital care spanning beyond 2 midnights from the point of admission due to high intensity of service, high risk for further deterioration and high frequency of surveillance required.Barbette Merino MD [facility] Pager [phone]  If 7PM-7AM, please contact night-coverage [email or web address] Password [facility]  06/07/2018, 10:48 PM

## 2018-06-07 NOTE — ED Provider Notes (Signed)
[facility] EMERGENCY DEPARTMENT Provider Note   CSN: [id number] Arrival date & time: 06/07/18  1616     History   Chief Complaint Chief Complaint  Patient presents with  . Seizures    HPI Mitchell [name] is a 67 y.o. male.  Patient was found down by his wife.  She found around 4:00 and last seen normal around 330.  He was unresponsive.  She called the paramedics and when the paramedics to bring him and he had a seizure.  He was given 2.5 mg of Versed and was not seizing when he reached the emergency department  The history is provided by the spouse. No language interpreter was used.  Seizures   This is a recurrent problem. The current episode started less than 1 hour ago. The problem has been resolved. There was 1 seizure. The most recent episode lasted less than 30 seconds. Associated symptoms include sleepiness. Characteristics do not include eye blinking. The seizure(s) had lower extremity and upper extremity focality. Possible causes do not include medication or dosage change. There has been no fever (No fever).    Past Medical History:  Diagnosis Date  . Alcohol abuse   . Gout   . Hyperlipemia   . Hypertension     Patient Active Problem List   Diagnosis Date Noted  . CVA (cerebral vascular accident) ([place]) 06/07/2018  . Acute pancreatitis 03/08/2017  . Chronic combined systolic and diastolic CHF (congestive heart failure) ([place]) 07/28/2015  . PEG (percutaneous endoscopic gastrostomy) status ([place]) 07/28/2015  . History of stroke 07/28/2015  . Primary gout   . Mental status change   . Dysphagia   . Acute respiratory failure with hypoxemia ([place])   . Pulmonary edema   . Tracheal perforation   . CAP (community acquired pneumonia)   . Chronic systolic CHF (congestive heart failure) ([place])   . Acute renal failure ([place])   . Alcohol abuse   . Anoxic brain injury ([place])   . Thrombocytopenia ([place])   . History of ETT   . Cardiac arrest ([place]) 07/08/2015  .  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 Hyperlipidemia 11/24/2006  . Gout, unspecified 11/24/2006  . HYPERTENSION, BENIGN SYSTEMIC 11/24/2006  . HEMORRHOIDS, NOS 11/24/2006  . DIVERTICULOSIS OF COLON 11/24/2006    Past Surgical History:  Procedure Laterality Date  . RADIOLOGY WITH ANESTHESIA N/A 07/17/2015   Procedure: RADIOLOGY WITH ANESTHESIA;  Surgeon: Medication Radiologist, MD;  Location: [facility];  Service: Radiology;  Laterality: N/A;        Home Medications    Prior to Admission medications   Medication Sig Start Date End Date Taking? Authorizing Provider  acetaminophen (TYLENOL) 325 MG tablet Take 2 tablets (650 mg total) by mouth every 6 (six) hours as needed for mild pain, moderate pain or fever (or Fever >/= 101). 03/09/17  Yes Hongalgi, Lenis Dickinson, MD  COLCRYS 0.6 MG tablet Take 0.6 mg by mouth 2 (two) times a week.  01/29/17  Yes [provider]  folic acid (FOLVITE) 1 MG tablet Take 1 tablet (1 mg total) by mouth daily. 07/19/15  Yes Allie Bossier, MD  furosemide (LASIX) 20 MG tablet Take 20 mg by mouth daily. 05/12/18  Yes [provider]  metoprolol tartrate (LOPRESSOR) 50 MG tablet Take 1 tablet (50 mg total) by mouth 2 (two) times daily. 03/09/17  Yes Hongalgi, Lenis Dickinson, MD  multivitamin (PROSIGHT) TABS tablet Take 1 tablet by  mouth daily. 07/19/15  Yes Allie Bossier, MD  pantoprazole (PROTONIX) 40 MG tablet Take 1 tablet (40 mg total) by mouth daily. 03/09/17  Yes Hongalgi, Lenis Dickinson, MD  potassium chloride (KLOR-CON M15) 15 MEQ tablet Take 2 tablets (30 mEq total) by mouth daily. 07/27/15  Yes Cherene Altes, MD  rivaroxaban (XARELTO) 20 MG TABS tablet Take 1 tablet (20 mg total) by mouth daily with supper. Patient taking differently: Take 20 mg by mouth daily.  08/14/15  Yes Cherene Altes, MD  thiamine 100 MG tablet Take 1 tablet (100 mg total) by mouth daily. 03/09/17  Yes  Hongalgi, Lenis Dickinson, MD  ULORIC 80 MG TABS Take 1 tablet by mouth daily.  12/28/16  Yes [provider]    Family History Family History  Problem Relation Age of Onset  . Aneurysm Sister   . Clotting disorder Brother   . Aneurysm Mother     Social History Social History   Tobacco Use  . Smoking status: Current Some Day Smoker    Types: Cigars  . Smokeless tobacco: Never Used  Substance Use Topics  . Alcohol use: No  . Drug use: No     Allergies   Hydrochlorothiazide and Pseudoephedrine   Review of Systems Review of Systems  Unable to perform ROS: Mental status change  Neurological: Positive for seizures.     Physical Exam Updated Vital Signs BP 135/64   Pulse (!) 56   Temp 98.4 F (36.9 C) (Rectal)   Resp 14   SpO2 98%   Physical Exam  Constitutional: He appears well-developed.  HENT:  Head: Normocephalic.  Eyes: Conjunctivae and EOM are normal. No scleral icterus.  Neck: Neck supple. No thyromegaly present.  Cardiovascular: Normal rate and regular rhythm. Exam reveals no gallop and no friction rub.  No murmur heard. Pulmonary/Chest: No stridor. He has no wheezes. He has no rales. He exhibits no tenderness.  Abdominal: He exhibits no distension. There is no tenderness. There is no rebound.  Musculoskeletal: He exhibits no edema.  Is all extremities secondary to pain  Lymphadenopathy:    He has no cervical adenopathy.  Neurological: He exhibits normal muscle tone. Coordination normal.  Patient lethargic and only responding to painful stimuli  Skin: No rash noted. No erythema.  Psychiatric: He has a normal mood and affect. His behavior is normal.     ED Treatments / Results  Labs (all labs ordered are listed, but only abnormal results are displayed) Labs Reviewed  URINALYSIS, ROUTINE W REFLEX MICROSCOPIC - Abnormal; Notable for the following components:      Result Value   APPearance HAZY (*)    Hgb urine dipstick LARGE (*)    Protein, ur  100 (*)    RBC / HPF >50 (*)    All other components within normal limits  RAPID URINE DRUG SCREEN, HOSP PERFORMED - Abnormal; Notable for the following components:   Benzodiazepines POSITIVE (*)    Tetrahydrocannabinol POSITIVE (*)    All other components within normal limits  CBC WITH DIFFERENTIAL/PLATELET - Abnormal; Notable for the following components:   RBC 4.16 (*)    Hemoglobin 12.8 (*)    MCV 101.0 (*)    All other components within normal limits  COMPREHENSIVE METABOLIC PANEL - Abnormal; Notable for the following components:   CO2 15 (*)    Glucose, Bld 133 (*)    Creatinine, Ser 2.58 (*)    Calcium 8.4 (*)    Albumin 3.2 (*)  AST 44 (*)    GFR calc non Af Amer 24 (*)    GFR calc Af Amer 28 (*)    Anion gap 19 (*)    All other components within normal limits  I-STAT CHEM 8, ED - Abnormal; Notable for the following components:   Creatinine, Ser 2.40 (*)    Glucose, Bld 128 (*)    Calcium, Ion 1.08 (*)    TCO2 16 (*)    All other components within normal limits  I-STAT CG4 LACTIC ACID, ED - Abnormal; Notable for the following components:   Lactic Acid, Venous 13.00 (*)    All other components within normal limits  CBG MONITORING, ED - Abnormal; Notable for the following components:   Glucose-Capillary 120 (*)    All other components within normal limits  I-STAT CG4 LACTIC ACID, ED - Abnormal; Notable for the following components:   Lactic Acid, Venous 3.16 (*)    All other components within normal limits  CULTURE, BLOOD (ROUTINE X 2)  CULTURE, BLOOD (ROUTINE X 2)  ETHANOL  CK  LACTIC ACID, PLASMA  I-STAT TROPONIN, ED  I-STAT CG4 LACTIC ACID, ED  I-STAT CG4 LACTIC ACID, ED    EKG None  Radiology Ct Head Wo Contrast  Result Date: 06/07/2018 CLINICAL DATA:  Altered LOC, witnessed seizure EXAM: CT HEAD WITHOUT CONTRAST CT CERVICAL SPINE WITHOUT CONTRAST TECHNIQUE: Multidetector CT imaging of the head and cervical spine was performed following the standard  protocol without intravenous contrast. Multiplanar CT image reconstructions of the cervical spine were also generated. COMPARISON:  MRI 07/17/2015, CT brain 07/08/2015 FINDINGS: CT HEAD FINDINGS Brain: Hypodensity with loss of the gray-white matter differentiation in the right high parietal lobe consistent with acute to subacute infarct. No hemorrhage. No mass. Mild atrophy. Stable ventricle size. Vascular: No hyperdense vessels.  Carotid vascular calcification Skull: Normal. Negative for fracture or focal lesion. Sinuses/Orbits: No acute finding. Mucous retention cyst in the left maxillary sinus. Other: None CT CERVICAL SPINE FINDINGS Alignment: Motion degraded study which limits evaluation for fracture. Facet alignment within normal limits. Skull base and vertebrae: Craniovertebral junction is intact. No obvious fracture allowing for motion Soft tissues and spinal canal: No prevertebral fluid or swelling. No visible canal hematoma. Disc levels: Moderate degenerative change at C3-C4, C5-C6 and C6-C7. Upper chest: Negative. Other: None IMPRESSION: 1. Hypodensity within the high right parietal lobe, consistent with acute to subacute infarct. No hemorrhage. Atrophy. 2. Motion degraded images of the cervical spine which limits evaluation for fracture. No definite acute osseous abnormality is seen. Electronically Signed   By: Donavan Foil M.D.   On: 06/07/2018 19:41   Ct Cervical Spine Wo Contrast  Result Date: 06/07/2018 CLINICAL DATA:  Altered LOC, witnessed seizure EXAM: CT HEAD WITHOUT CONTRAST CT CERVICAL SPINE WITHOUT CONTRAST TECHNIQUE: Multidetector CT imaging of the head and cervical spine was performed following the standard protocol without intravenous contrast. Multiplanar CT image reconstructions of the cervical spine were also generated. COMPARISON:  MRI 07/17/2015, CT brain 07/08/2015 FINDINGS: CT HEAD FINDINGS Brain: Hypodensity with loss of the gray-white matter differentiation in the right high  parietal lobe consistent with acute to subacute infarct. No hemorrhage. No mass. Mild atrophy. Stable ventricle size. Vascular: No hyperdense vessels.  Carotid vascular calcification Skull: Normal. Negative for fracture or focal lesion. Sinuses/Orbits: No acute finding. Mucous retention cyst in the left maxillary sinus. Other: None CT CERVICAL SPINE FINDINGS Alignment: Motion degraded study which limits evaluation for fracture. Facet alignment within normal limits. Skull  base and vertebrae: Craniovertebral junction is intact. No obvious fracture allowing for motion Soft tissues and spinal canal: No prevertebral fluid or swelling. No visible canal hematoma. Disc levels: Moderate degenerative change at C3-C4, C5-C6 and C6-C7. Upper chest: Negative. Other: None IMPRESSION: 1. Hypodensity within the high right parietal lobe, consistent with acute to subacute infarct. No hemorrhage. Atrophy. 2. Motion degraded images of the cervical spine which limits evaluation for fracture. No definite acute osseous abnormality is seen. Electronically Signed   By: Donavan Foil M.D.   On: 06/07/2018 19:41   Dg Chest Port 1 View  Result Date: 06/07/2018 CLINICAL DATA:  Seizure today EXAM: PORTABLE CHEST 1 VIEW COMPARISON:  July 26, 2015 FINDINGS: The heart size and mediastinal contours are within normal limits. Aorta is tortuous both lungs are clear. The visualized skeletal structures are stable. IMPRESSION: No active cardiopulmonary disease. Electronically Signed   By: Abelardo Diesel M.D.   On: 06/07/2018 16:56    Procedures Procedures (including critical care time)  Medications Ordered in ED Medications  sodium chloride 0.9 % bolus 1,000 mL (0 mLs Intravenous Stopped 06/07/18 1813)  levETIRAcetam (KEPPRA) IVPB 1000 mg/100 mL premix (0 mg Intravenous Stopped 06/07/18 1812)  sodium chloride 0.9 % bolus 1,000 mL (0 mLs Intravenous Stopped 06/07/18 1852)  piperacillin-tazobactam (ZOSYN) IVPB 3.375 g (0 g Intravenous Stopped  06/07/18 1813)  vancomycin (VANCOCIN) IVPB 1000 mg/200 mL premix (0 mg Intravenous Stopped 06/07/18 1923)     Initial Impression / Assessment and Plan / ED Course  I have reviewed the triage vital signs and the nursing notes.  Pertinent labs & imaging results that were available during my care of the patient were reviewed by me and considered in my medical decision making (see chart for details).    CRITICAL CARE Performed by: Milton Ferguson Total critical care time:45 minutes Critical care time was exclusive of separately billable procedures and treating other patients. Critical care was necessary to treat or prevent imminent or life-threatening deterioration. Critical care was time spent personally by me on the following activities: development of treatment plan with patient and/or surrogate as well as nursing, discussions with consultants, evaluation of patient's response to treatment, examination of patient, obtaining history from patient or surrogate, ordering and performing treatments and interventions, ordering and review of laboratory studies, ordering and review of radiographic studies, pulse oximetry and re-evaluation of patient's condition. Patient finally awoke in the emergency department and was oriented to person place time situation.  CT scan shows possible stroke.  Neurology has been consulted and recommended an MRI of the brain with admission by medicine.  Final Clinical Impressions(s) / ED Diagnoses   Final diagnoses:  Seizure [facility])    ED Discharge Orders    None       Milton Ferguson, MD 06/07/18 2126

## 2018-06-07 NOTE — ED Notes (Signed)
Patient transported to CT 

## 2018-06-07 NOTE — ED Notes (Signed)
Attempted report 

## 2018-06-07 NOTE — ED Triage Notes (Signed)
Patient had seizure with family. Upon arrival by EMS patient was post dictal and during transport had another seizure. 2.5 versed given. Nasal trumpet placed, patient on NRB. Patient unresponsive upon arrival.

## 2018-06-07 NOTE — ED Notes (Signed)
I attempted to put in foley I was unsuccessful

## 2018-06-07 NOTE — ED Notes (Signed)
Pt taken to bathroom via wheelchair, unsteady at first when getting up. Assisted to toilet with tech and nurse assistance

## 2018-06-08 ENCOUNTER — Inpatient Hospital Stay (HOSPITAL_COMMUNITY): Payer: Medicare Other

## 2018-06-08 DIAGNOSIS — I6300 Cerebral infarction due to thrombosis of unspecified precerebral artery: Secondary | ICD-10-CM

## 2018-06-08 DIAGNOSIS — I351 Nonrheumatic aortic (valve) insufficiency: Secondary | ICD-10-CM

## 2018-06-08 DIAGNOSIS — R569 Unspecified convulsions: Secondary | ICD-10-CM

## 2018-06-08 LAB — LIPID PANEL
CHOLESTEROL: 165 mg/dL (ref 0–200)
HDL: 86 mg/dL (ref >40)
LDL Cholesterol: 62 mg/dL (ref 0–99)
Total CHOL/HDL Ratio: 1.9 RATIO
Triglycerides: 87 mg/dL (ref <150)
VLDL: 17 mg/dL (ref 0–40)

## 2018-06-08 LAB — BASIC METABOLIC PANEL
Anion gap: 8 (ref 5–15)
BUN: 10 mg/dL (ref 8–23)
CHLORIDE: 114 mmol/L — AB (ref 98–111)
CO2: 17 mmol/L — AB (ref 22–32)
Calcium: 7.3 mg/dL — ABNORMAL LOW (ref 8.9–10.3)
Creatinine, Ser: 2.04 mg/dL — ABNORMAL HIGH (ref 0.61–1.24)
GFR calc Af Amer: 37 mL/min — ABNORMAL LOW (ref >60)
GFR calc non Af Amer: 32 mL/min — ABNORMAL LOW (ref >60)
GLUCOSE: 89 mg/dL (ref 70–99)
POTASSIUM: 3.8 mmol/L (ref 3.5–5.1)
Sodium: 139 mmol/L (ref 135–145)

## 2018-06-08 LAB — HEMOGLOBIN A1C
HEMOGLOBIN A1C: 5.2 % (ref 4.8–5.6)
MEAN PLASMA GLUCOSE: 102.54 mg/dL

## 2018-06-08 LAB — ECHOCARDIOGRAM COMPLETE
Height: 68 in
Weight: 2853.63 oz

## 2018-06-08 LAB — LACTIC ACID, PLASMA
LACTIC ACID, VENOUS: 2.2 mmol/L — AB (ref 0.5–1.9)
Lactic Acid, Venous: 0.9 mmol/L (ref 0.5–1.9)

## 2018-06-08 MED ORDER — ADULT MULTIVITAMIN W/MINERALS CH: 1.0000 | ORAL_TABLET | Freq: Every day | ORAL | Status: DC

## 2018-06-08 MED ORDER — LORAZEPAM 2 MG/ML IJ SOLN
1.0000 mg | Freq: Four times a day (QID) | INTRAMUSCULAR | Status: AC | PRN
  Administered 2018-06-09 – 2018-06-10 (×3): 1 mg via INTRAVENOUS
  Filled 2018-06-08 (×3): qty 1

## 2018-06-08 MED ORDER — FOLIC ACID 1 MG PO TABS: 1.0000 mg | ORAL_TABLET | Freq: Every day | ORAL | Status: DC

## 2018-06-08 MED ORDER — VITAMIN B-1 100 MG PO TABS: 100.0000 mg | ORAL_TABLET | Freq: Every day | ORAL | Status: DC

## 2018-06-08 MED ORDER — LORAZEPAM 1 MG PO TABS
1.0000 mg | ORAL_TABLET | Freq: Four times a day (QID) | ORAL | Status: AC | PRN
  Administered 2018-06-09: 1 mg via ORAL
  Filled 2018-06-08: qty 1

## 2018-06-08 MED ORDER — SODIUM CHLORIDE 0.9 % IV BOLUS
500.0000 mL | Freq: Once | INTRAVENOUS | Status: AC
  Administered 2018-06-08: 500 mL via INTRAVENOUS

## 2018-06-08 MED ORDER — THIAMINE HCL 100 MG/ML IJ SOLN: 100.0000 mg | Freq: Every day | INTRAMUSCULAR | Status: DC

## 2018-06-08 NOTE — Progress Notes (Signed)
[facility]                                                                              Patient Demographics  Mitchell [name], is a 67 y.o. male, DOB - 1951/08/09, FVC:[id number]  Admit date - 06/07/2018   Admitting Physician Elwyn Reach, MD  Outpatient Primary MD for the patient is [name], MD  Outpatient specialists:   LOS - 1  days   Medical records reviewed and are as summarized below:    Chief Complaint  Patient presents with  . 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  He was suspected to have a seizure due to alcohol withdrawal. Patient was last seen normal at 3:30 AM around 4 AM his wife found him unresponsive.  She called paramedics who came by and try to bring patient into the hospital.  In route he had seizure and was given 2.5 mg of Versed.  CT head showed acute to subacute CVA, neurology was consulted.   Assessment & Plan    Principal Problem: Acute/subacute CVA (cerebral vascular accident) ([place]) -Presented with unresponsive episode.  CT head showed hypodensity in the high right parietal lobe consistent with acute to subacute infarct -Alert and awake today, not a good historian, MRI brain, MRA pending -Follow 2D echo, stroke work-up, PT OT evaluation -Speech therapy evaluation recommended home health SLP -Currently on aspirin, Xarelto, statin -LDL 62 A1c 5.2 -Per neurology, if MRI consistent with cortical edema due to seizure, rather than CVA, discontinue aspirin    Active Problems: Seizure: Possibly due to alcohol versus CVA -Patient was placed on Keppra in ED. currently on CIWA scale with Ativan -Continue seizure precaution  Acute kidney injury on chronic kidney disease stage III -Patient presented with creatinine function of 2.5, lactic  acidosis 13.0 -Hold furosemide today, continue gentle hydration for 2 L then KVO -Baseline creatinine ~ 2.0  Lactic acidosis -Likely due to seizures, lactic acid 13.0 at the time of admission, CK normal, -Improved, 0.9, continue gentle hydration today    Hyperlipidemia -LDL 62, continue Lipitor    Gout, unspecified -Stable, no acute issues, continue Uloric    HYPERTENSION, BENIGN SYSTEMIC -BP currently stable, continue Lopressor    Chronic systolic CHF (congestive heart failure) (HCC) -Currently compensated, euvolemic -Holding Lasix for today, restart in am    Alcohol abuse -Patient counseled on alcohol cessation, continue CIWA scale with Ativan  Code Status: Full CODE STATUS DVT Prophylaxis: Xarelto Family Communication: Discussed in detail with the patient, all imaging results, lab results explained to the patient    Disposition Plan: Pending clinical improvement and imaging  Time Spent in minutes 35  Procedures:  MRI brain  Consultants:   Neurology  Antimicrobials:      Medications  Scheduled Meds: . aspirin  300 mg Rectal Daily   Or  . aspirin  325 mg Oral Daily  . atorvastatin  40 mg Oral q1800  . colchicine  0.6 mg Oral Once per day on Mon Thu  . febuxostat  80 mg Oral Daily  . folic acid  1 mg Oral Daily  . metoprolol tartrate  50 mg Oral BID  . multivitamin  1 tablet Oral Daily  . pantoprazole  40 mg Oral Daily  . potassium chloride SA  30 mEq Oral Daily  . rivaroxaban  20 mg Oral Daily  . thiamine  100 mg Oral Daily   Continuous Infusions: . sodium chloride 75 mL/hr at 06/08/18 0030   PRN Meds:.acetaminophen **OR** acetaminophen (TYLENOL) oral liquid 160 mg/5 mL **OR** acetaminophen, LORazepam **OR** LORazepam, senna-docusate   Antibiotics   Anti-infectives (From admission, onward)   Start     Dose/Rate Route Frequency Ordered Stop   06/07/18 1715  piperacillin-tazobactam (ZOSYN) IVPB 3.375 g     3.375 g 100 mL/hr over 30 Minutes  Intravenous  Once 06/07/18 1705 06/07/18 1813   06/07/18 1715  vancomycin (VANCOCIN) IVPB 1000 mg/200 mL premix     1,000 mg 200 mL/hr over 60 Minutes Intravenous  Once 06/07/18 1705 06/07/18 1923        Subjective:   [name] was seen and examined today.  Still somewhat confused, slow in speaking, poor historian.  Difficulty obtaining review of system.  Objective:   Vitals:   06/08/18 0200 06/08/18 0400 06/08/18 0600 06/08/18 0756  BP: 130/68 135/65 126/81 (!) 141/80  Pulse: (!) 56 78 75 63  Resp: 18 18 18 18   Temp: 99.8 F (37.7 C) 98.8 F (37.1 C) 98.9 F (37.2 C) 98.1 F (36.7 C)  TempSrc: Oral Oral Oral   SpO2: 100% 99% 98% 95%  Weight:      Height:       No intake or output data in the 24 hours ending 06/08/18 1055   Wt Readings from Last 3 Encounters:  06/07/18 80.9 kg  03/09/17 86.2 kg  08/14/15 73.7 kg     Exam  General: Alert and oriented x self and place, NAD  Eyes:   HEENT:  Atraumatic, normocephalic,  Cardiovascular: S1 S2 auscultated, Regular rate and rhythm.  Respiratory: Clear to auscultation bilaterally, no wheezing, rales or rhonchi  Gastrointestinal: Soft, nontender, nondistended, + bowel sounds  Ext: no pedal edema bilaterally  Neuro: appears to have somewhat global weakness  Musculoskeletal: No digital cyanosis, clubbing  Skin: No rashes  Psych: still somewhat confused   Data Reviewed:  I have personally reviewed following labs and imaging studies  Micro Results Recent Results (from the past 240 hour(s))  Blood Culture (routine x 2)     Status: None (Preliminary result)   Collection Time: 06/07/18  5:30 PM  Result Value Ref Range Status   Specimen Description BLOOD RIGHT ANTECUBITAL  Final   Special Requests   Final    BOTTLES DRAWN AEROBIC AND ANAEROBIC Blood Culture adequate volume   Culture   Final    NO GROWTH < 12 HOURS Performed at [facility], [address], [place], [place] [address]    Report Status  PENDING  Incomplete  Blood Culture (routine x 2)     Status: None (Preliminary result)   Collection Time: 06/07/18  5:30 PM  Result Value Ref Range Status   Specimen Description BLOOD RIGHT FOREARM  Final   Special Requests   Final    BOTTLES DRAWN AEROBIC AND ANAEROBIC Blood Culture adequate volume   Culture   Final    NO GROWTH < 12 HOURS Performed at [facility], [place] [address], [place], [place] [address]    Report Status PENDING  Incomplete    Radiology Reports Ct Head Wo Contrast  Result Date: 06/07/2018 CLINICAL DATA:  Altered LOC, witnessed seizure EXAM: CT HEAD WITHOUT CONTRAST CT CERVICAL SPINE WITHOUT CONTRAST TECHNIQUE: Multidetector CT imaging of the head and cervical spine was performed following the standard protocol without intravenous contrast. Multiplanar CT image reconstructions of the cervical spine were also generated. COMPARISON:  MRI 07/17/2015, CT brain 07/08/2015 FINDINGS: CT HEAD FINDINGS Brain: Hypodensity with loss of the gray-white matter differentiation in the right high parietal lobe consistent with acute to subacute infarct. No hemorrhage. No mass. Mild atrophy. Stable ventricle size. Vascular: No hyperdense vessels.  Carotid vascular calcification Skull: Normal. Negative for fracture or focal lesion. Sinuses/Orbits: No acute finding. Mucous retention cyst in the left maxillary sinus. Other: None CT CERVICAL SPINE FINDINGS Alignment: Motion degraded study which limits evaluation for fracture. Facet alignment within normal limits. Skull base and vertebrae: Craniovertebral junction is intact. No obvious fracture allowing for motion Soft tissues and spinal canal: No prevertebral fluid or swelling. No visible canal hematoma. Disc levels: Moderate degenerative change at C3-C4, C5-C6 and C6-C7. Upper chest: Negative. Other: None IMPRESSION: 1. Hypodensity within the high right parietal lobe, consistent with acute to subacute infarct. No hemorrhage. Atrophy. 2. Motion  degraded images of the cervical spine which limits evaluation for fracture. No definite acute osseous abnormality is seen. Electronically Signed   By: Donavan Foil M.D.   On: 06/07/2018 19:41   Ct Cervical Spine Wo Contrast  Result Date: 06/07/2018 CLINICAL DATA:  Altered LOC, witnessed seizure EXAM: CT HEAD WITHOUT CONTRAST CT CERVICAL SPINE WITHOUT CONTRAST TECHNIQUE: Multidetector CT imaging of the head and cervical spine was performed following the standard protocol without intravenous contrast. Multiplanar CT image reconstructions of the cervical spine were also generated. COMPARISON:  MRI 07/17/2015, CT brain 07/08/2015 FINDINGS: CT HEAD FINDINGS Brain: Hypodensity with loss of the gray-white matter differentiation in the right high parietal lobe consistent with acute to subacute infarct. No hemorrhage. No mass. Mild atrophy. Stable ventricle size. Vascular: No hyperdense vessels.  Carotid vascular calcification Skull: Normal. Negative for fracture or focal lesion. Sinuses/Orbits: No acute finding. Mucous retention cyst in the left maxillary sinus. Other: None CT CERVICAL SPINE FINDINGS Alignment: Motion degraded study which limits evaluation for fracture. Facet alignment within normal limits. Skull base and vertebrae: Craniovertebral junction is intact. No obvious fracture allowing for motion Soft tissues and spinal canal: No prevertebral fluid or swelling. No visible canal hematoma. Disc levels: Moderate degenerative change at C3-C4, C5-C6 and C6-C7. Upper chest: Negative. Other: None IMPRESSION: 1. Hypodensity within the high right parietal lobe, consistent with acute to subacute infarct. No hemorrhage. Atrophy. 2. Motion degraded images of the cervical spine which limits evaluation for fracture. No definite acute osseous abnormality is seen. Electronically Signed   By: Donavan Foil M.D.   On: 06/07/2018 19:41   Dg Chest Port 1 View  Result Date: 06/07/2018 CLINICAL DATA:  Seizure today EXAM:  PORTABLE CHEST 1 VIEW COMPARISON:  July 26, 2015 FINDINGS: The heart size and mediastinal contours are within normal limits. Aorta is tortuous both lungs are clear. The visualized skeletal structures are stable. IMPRESSION: No active cardiopulmonary disease. Electronically Signed   By: Abelardo Diesel M.D.   On: 06/07/2018 16:56    Lab Data:  CBC: Recent Labs  Lab 06/07/18 1645 06/07/18 1655  WBC 5.9  --   NEUTROABS 4.5  --  HGB 12.8* 14.3  HCT 42.0 42.0  MCV 101.0*  --   PLT 164  --    Basic Metabolic Panel: Recent Labs  Lab 06/07/18 1645 06/07/18 1655 06/08/18 0522  NA 142 140 139  K 4.2 4.9 3.8  CL 108 110 114*  CO2 15*  --  17*  GLUCOSE 133* 128* 89  BUN 10 15 10   CREATININE 2.58* 2.40* 2.04*  CALCIUM 8.4*  --  7.3*   GFR: Estimated Creatinine Clearance: 33.5 mL/min (A) (by C-G formula based on SCr of 2.04 mg/dL (H)). Liver Function Tests: Recent Labs  Lab 06/07/18 1645  AST 44*  ALT 11  ALKPHOS 92  BILITOT 1.0  PROT 7.1  ALBUMIN 3.2*   No results for input(s): LIPASE, AMYLASE in the last 168 hours. No results for input(s): AMMONIA in the last 168 hours. Coagulation Profile: No results for input(s): INR, PROTIME in the last 168 hours. Cardiac Enzymes: Recent Labs  Lab 06/07/18 1645  CKTOTAL 188   BNP (last 3 results) No results for input(s): PROBNP in the last 8760 hours. HbA1C: Recent Labs    06/08/18 0002  HGBA1C 5.2   CBG: Recent Labs  Lab 06/07/18 1621  GLUCAP 120*   Lipid Profile: Recent Labs    06/08/18 0002  CHOL 165  HDL 86  LDLCALC 62  TRIG 87  CHOLHDL 1.9   Thyroid Function Tests: No results for input(s): TSH, T4TOTAL, FREET4, T3FREE, THYROIDAB in the last 72 hours. Anemia Panel: No results for input(s): VITAMINB12, FOLATE, FERRITIN, TIBC, IRON, RETICCTPCT in the last 72 hours. Urine analysis:    Component Value Date/Time   COLORURINE YELLOW 06/07/2018 1855   APPEARANCEUR HAZY (A) 06/07/2018 1855   LABSPEC 1.012  06/07/2018 1855   PHURINE 5.0 06/07/2018 1855   GLUCOSEU NEGATIVE 06/07/2018 1855   HGBUR LARGE (A) 06/07/2018 1855   BILIRUBINUR NEGATIVE 06/07/2018 1855   KETONESUR NEGATIVE 06/07/2018 1855   PROTEINUR 100 (A) 06/07/2018 1855   UROBILINOGEN 1.0 07/19/2015 1739   NITRITE NEGATIVE 06/07/2018 1855   LEUKOCYTESUR NEGATIVE 06/07/2018 1855     Daley Mooradian M.D. [facility] 06/08/2018, 10:55 AM  Pager: [phone] Between 7am to 7pm - call Pager - [phone]  After 7pm go to [email or web address] - password [email or web address]  Call night coverage person covering after 7pm

## 2018-06-08 NOTE — Progress Notes (Signed)
*  Preliminary Results* Carotid artery duplex has been completed. Bilateral internal carotid arteries are 1-39%. Vertebral arteries are patent with antegrade flow.  06/08/2018 2:18 PM  Abram Sander

## 2018-06-08 NOTE — Evaluation (Signed)
Occupational Therapy Evaluation Patient Details Name: Mitchell [name] MRN: [id number] DOB: 1951/05/09 Today's Date: 06/08/2018    History of Present Illness Pt is a 67 y.o. 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 Workup for seizure vs CVA. 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    Clinical Impression   PTA patient reports independent with ADL and IADLs, has RW but was not using for mobility.  Admitted for above and limited by decreased activity tolerance, impaired balance, generalized weakness, and impaired cognition.  He currently requires setup assist for UB ADL, min assist for LB ADLs, min guard for toilet transfers and toileting, min guard for grooming, and min guard for bed mobility.  He completed short distance functional mobility using RW, cueing for safety and walker management throughout session.  Based on performance today, patient will benefit from continued OT services while admitted and after dc at [facility] level in order to optimize independence with ADLs and functional mobility in order to return to PLOF.  Will continue to follow.     Follow Up Recommendations  Home health OT;Supervision/Assistance - 24 hour    Equipment Recommendations  None recommended by OT    Recommendations for Other Services       Precautions / Restrictions Precautions Precautions: Fall Restrictions Weight Bearing Restrictions: No      Mobility Bed Mobility Overal bed mobility: Needs Assistance Bed Mobility: Supine to Sit;Sit to Supine     Supine to sit: Min guard Sit to supine: Min guard   General bed mobility comments: min guard for safety, increased time and effort to manage LEs and ascend trunk  Transfers Overall  transfer level: Needs assistance Equipment used: Rolling walker (2 wheeled) Transfers: Sit to/from Stand Sit to Stand: Min guard         General transfer comment: cueing for hand placement and safety, min guard for safety    Balance Overall balance assessment: Needs assistance Sitting-balance support: No upper extremity supported;Feet supported Sitting balance-Leahy Scale: Fair     Standing balance support: No upper extremity supported;During functional activity Standing balance-Leahy Scale: Fair Standing balance comment: min guard for safety                           ADL either performed or assessed with clinical judgement   ADL Overall ADL's : Needs assistance/impaired Eating/Feeding: Set up;Sitting   Grooming: Wash/dry hands;Min guard;Standing   Upper Body Bathing: Set up;Sitting   Lower Body Bathing: Minimal assistance;Sit to/from stand   Upper Body Dressing : Set up;Sitting   Lower Body Dressing: Minimal assistance;Sit to/from stand Lower Body Dressing Details (indicate cue type and reason): requires assistance to adjust socks, min guard in standing  Toilet Transfer: Min guard;Grab bars;RW;Ambulation Toilet Transfer Details (indicate cue type and reason): for safety and balance Toileting- Clothing Manipulation and Hygiene: Min guard;Sit to/from stand       Functional mobility during ADLs: Min guard;Rolling walker(requires cueing for walker management and safety) General ADL Comments: Completed bed mobility, ADLs, and short distance mobility.  Limited by decreased activity tolerance and generalized weakness.      Vision Baseline Vision/History: Wears glasses Wears Glasses: At all times Patient Visual Report: No change from baseline Vision Assessment?: No apparent  visual deficits     Perception     Praxis      Pertinent Vitals/Pain Pain Assessment: No/denies pain     Hand Dominance Right   Extremity/Trunk Assessment Upper Extremity  Assessment Upper Extremity Assessment: Generalized weakness(noted arthritic hands bilaterally)   Lower Extremity Assessment Lower Extremity Assessment: Defer to PT evaluation       Communication Communication Communication: No difficulties   Cognition Arousal/Alertness: Awake/alert Behavior During Therapy: WFL for tasks assessed/performed Overall Cognitive Status: Impaired/Different from baseline Area of Impairment: Following commands;Safety/judgement;Awareness;Problem solving                       Following Commands: Follows multi-step commands inconsistently;Follows multi-step commands with increased time Safety/Judgement: Decreased awareness of safety;Decreased awareness of deficits Awareness: Emergent Problem Solving: Slow processing;Decreased initiation;Requires verbal cues General Comments: spouse reports returning to baseline    General Comments  spouse present     Exercises     Shoulder Instructions      Home Living Family/patient expects to be discharged to:: Private residence   Available Help at Discharge: Family;Available 24 hours/day Type of Home: House Home Access: Level entry     Home Layout: One level     Bathroom Shower/Tub: Teacher, early years/pre: Standard     Home Equipment: Bedside commode;Shower seat;Walker - 2 wheels;Cane - single point;Toilet riser          Prior Functioning/Environment Level of Independence: Needs assistance  Gait / Transfers Assistance Needed: independent with mobility ADL's / Homemaking Assistance Needed: modified independent with ADLs, IADLs, med mgmt (spouse assist with finances)             OT Problem List: Decreased strength;Decreased activity tolerance;Impaired balance (sitting and/or standing);Decreased coordination;Decreased cognition;Decreased safety awareness;Decreased knowledge of use of DME or AE;Decreased knowledge of precautions      OT Treatment/Interventions: Self-care/ADL  training;Therapeutic exercise;Energy conservation;DME and/or AE instruction;Therapeutic activities;Patient/family education;Balance training;Cognitive remediation/compensation    OT Goals(Current goals can be found in the care plan section) Acute Rehab OT Goals Patient Stated Goal: to get home  OT Goal Formulation: With patient Time For Goal Achievement: 06/22/18 Potential to Achieve Goals: Good  OT Frequency: Min 2X/week   Barriers to D/C:            Co-evaluation              AM-PAC PT "6 Clicks" Daily Activity     Outcome Measure Help from another person eating meals?: None Help from another person taking care of personal grooming?: A Little Help from another person toileting, which includes using toliet, bedpan, or urinal?: A Little Help from another person bathing (including washing, rinsing, drying)?: None Help from another person to put on and taking off regular upper body clothing?: None Help from another person to put on and taking off regular lower body clothing?: A Little 6 Click Score: 21   End of Session Equipment Utilized During Treatment: Gait belt;Rolling walker Nurse Communication: Mobility status;Precautions  Activity Tolerance: Patient tolerated treatment well Patient left: in bed;with call bell/phone within reach;with bed alarm set;with family/visitor present  OT Visit Diagnosis: Other abnormalities of gait and mobility (R26.89);Unsteadiness on feet (R26.81);Muscle weakness (generalized) (M62.81)                Time: 9937-1696 OT Time Calculation (min): 22 min Charges:  OT General Charges $OT Visit: 1 Visit OT Evaluation $OT Eval Moderate Complexity: 1 [place], OT Acute Rehabilitation Services  Pager [phone] Office [place] 06/08/2018, 5:33 PM

## 2018-06-08 NOTE — Progress Notes (Signed)
OT Cancellation Note  Patient Details Name: Mitchell [name] MRN: [id number] DOB: 02-11-1951   Cancelled Treatment:    Reason Eval/Treat Not Completed: Patient at procedure or test/ unavailable. Will continue to follow and initiate OT eval as able.   Delight Stare, OT Acute Rehabilitation Services Pager [phone] Office [phone]   Delight Stare 06/08/2018, 2:43 PM

## 2018-06-08 NOTE — Progress Notes (Signed)
  Echocardiogram 2D Echocardiogram has been performed.  Jennette Dubin 06/08/2018, 2:52 PM

## 2018-06-08 NOTE — Progress Notes (Signed)
CRITICAL VALUE ALERT  Critical Value:  Lactic acid 2.2  Date & Time Notied:  06/08/18 0005  Provider Notified: paged provider on call  Orders Received/Actions taken: none

## 2018-06-08 NOTE — Progress Notes (Signed)
EEG Completed; Results Pending  

## 2018-06-08 NOTE — Procedures (Signed)
ELECTROENCEPHALOGRAM REPORT   Patient: Mitchell [name]       Room #: 2Y23X EEG No. ID: [id number] Age: 67 y.o.        Sex: male Referring Physician: Rai Report Date:  06/08/2018        Interpreting Physician: Alexis Goodell  History: Nicanor Mendolia is an 67 y.o. male with altered mental status and subacute infarct  Medications:  ASA, Lipitor, Colchicine, Uloric, Folvite, Lopressor, MVI, Protonix, Xarelto, Thiamine  Conditions of Recording:  This is a 21 channel routine scalp EEG performed with bipolar and monopolar montages arranged in accordance to the international 10/20 system of electrode placement. One channel was dedicated to EKG recording.  The patient is in the awake, drowsy and asleep states.  Description:  The waking background activity consists of a low voltage, symmetrical, fairly well organized, 9-10 Hz alpha activity, seen from the parieto-occipital and posterior temporal regions.  Low voltage fast activity, poorly organized, is seen anteriorly and is at times superimposed on more posterior regions.  A mixture of theta and alpha rhythms are seen from the central and temporal regions. The patient drowses with slowing to irregular, low voltage theta and beta activity.   The patient goes in to a light sleep with symmetrical sleep spindles, vertex central sharp transients and irregular slow activity.  No epileptiform activity is noted.   Hyperventilation and intermittent photic stimulation were not performed.  IMPRESSION: Normal electroencephalogram, awake, drowsy and asleep. There are no focal lateralizing or epileptiform features.   Alexis Goodell, MD Neurology [phone] 06/08/2018, 5:23 PM

## 2018-06-08 NOTE — Evaluation (Signed)
Speech Language Pathology Evaluation Patient Details Name: Mitchell [name] MRN: [id number] DOB: 15-Sep-1951 Today's Date: 06/08/2018 Time: 0240-9735 SLP Time Calculation (min) (ACUTE ONLY): 50 min  Problem List:  Patient Active Problem List   Diagnosis Date Noted  . CVA (cerebral vascular accident) ([place]) 06/07/2018  . Acute pancreatitis 03/08/2017  . Chronic combined systolic and diastolic CHF (congestive heart failure) ([place]) 07/28/2015  . PEG (percutaneous endoscopic gastrostomy) status ([place]) 07/28/2015  . History of stroke 07/28/2015  . Primary gout   . Mental status change   . Dysphagia   . Acute respiratory failure with hypoxemia ([place])   . Pulmonary edema   . Tracheal perforation   . CAP (community acquired pneumonia)   . Chronic systolic CHF (congestive heart failure) ([place])   . Acute renal failure ([place])   . Alcohol abuse   . Anoxic brain injury ([place])   . Thrombocytopenia ([place])   . History of ETT   . Cardiac arrest ([place]) 07/08/2015  . 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 Hyperlipidemia 11/24/2006  . Gout, unspecified 11/24/2006  . HYPERTENSION, BENIGN SYSTEMIC 11/24/2006  . HEMORRHOIDS, NOS 11/24/2006  . DIVERTICULOSIS OF COLON 11/24/2006   Past Medical History:  Past Medical History:  Diagnosis Date  . Alcohol abuse   . Gout   . Hyperlipemia   . Hypertension    Past Surgical History:  Past Surgical History:  Procedure Laterality Date  . RADIOLOGY WITH ANESTHESIA N/A 07/17/2015   Procedure: RADIOLOGY WITH ANESTHESIA;  Surgeon: Medication Radiologist, MD;  Location: [facility];  Service: Radiology;  Laterality: N/A;   HPI:  67 yo male adm to [facility] with AMS, seizure.  Pt with PMH + for cva 06/2015 with dysphagia requiring feeding tube- removed 3/17. Pt found to have high right parietal hypodensity - ? acute to subacute cva.  Pt ambulates using walker per his statement and he spent time in a rehab after prior  cva.  CXR negative. Speech evaluation ordered as part of stroke work up.     Assessment / Plan / Recommendation Clinical Impression  MOCA 7.1 administered to pt with him scoring 12/30.  He demonstrated strengths in naming items, listening for cued letter and language.  Difficulties noted in [place], memory, verbal fluency and excecutive skills.  Perseveration noted during session.  Pt and his wife report this is new and he has been managing bills, medications, etc at home - thus highly recommend follow up with SlP to help maximize independence.  Pt educated to findings and agreeable to plan.  Functional cognitive information shared and reinforced.       SLP Assessment  SLP Recommendation/Assessment: Patient needs continued Speech Lanaguage Pathology Services SLP Visit Diagnosis: Attention and concentration deficit;Frontal lobe and executive function deficit Attention and concentration deficit following: Cerebral infarction Frontal lobe and executive function deficit following: Cerebral infarction    Follow Up Recommendations  Home health SLP    Frequency and Duration min 1 x/week  1 week      SLP Evaluation Cognition  Overall Cognitive Status: Impaired/Different from baseline(per wife and pt) Arousal/Alertness: Awake/alert Orientation Level: Oriented to person;Oriented to place;Disoriented to time;Oriented to situation Memory: Impaired Memory Impairment: Retrieval deficit;Storage deficit(4/5 words recalled with cues, did not recall one with multiple choice) Awareness: Appears intact Awareness Impairment: Intellectual impairment;Emergent impairment(stating he is having problems with numbers) Problem Solving: Impaired Problem Solving Impairment: Verbal basic;Functional basic Behaviors: Perseveration Safety/Judgment: Impaired       Comprehension  Auditory Comprehension Overall Auditory Comprehension: Appears within functional limits for tasks assessed Yes/No Questions: Not  tested Commands: Within Functional Limits Conversation: Complex Visual Recognition/Discrimination Discrimination: Not tested Reading Comprehension Reading Status: Not tested    Expression Expression Primary Mode of Expression: Verbal Verbal Expression Overall Verbal Expression: Appears within functional limits for tasks assessed Initiation: No impairment Repetition: Impaired Level of Impairment: Sentence level Naming: No impairment Pragmatics: Impairment Written Expression Dominant Hand: Right Written Expression: Exceptions to WFL(clock drawing impaired, difficulty with writing numbers)   Oral / Motor  Oral Motor/Sensory Function Overall Oral Motor/Sensory Function: Within functional limits Motor Speech Overall Motor Speech: Appears within functional limits for tasks assessed Respiration: Within functional limits Resonance: Within functional limits Articulation: Within functional limitis Intelligibility: Intelligible Motor Planning: Witnin functional limits Motor Speech Errors: Not applicable   GO                    Macario Golds 06/08/2018, 9:21 AM   Luanna Salk, [facility] SLP [phone]

## 2018-06-08 NOTE — Progress Notes (Signed)
PT Cancellation Note  Patient Details Name: Mitchell [name] MRN: [id number] DOB: 06-26-1951   Cancelled Treatment:    Reason Eval/Treat Not Completed: Patient at procedure or test/unavailable Pt off floor. Will follow.   [name] 06/08/2018, 9:20 AM Wray Kearns, PT, DPT Acute Rehabilitation Services Pager [phone] Office [phone]

## 2018-06-08 NOTE — Consult Note (Signed)
NEURO HOSPITALIST CONSULT NOTE   Requestig physician: Dr. Tana Coast  Reason for Consult:Seizure  History obtained from:   Chart    HPI:                                                                                                                                          Mitchell [name] is an 67 y.o. male with a history of EtOH abuse who presented to the ED via EMS after being found down at home at 4:00 PM on Wednesday, unresponsive. It was felt that he may have had a seizure due to EtOH withdrawal. LKN was 3:30 PM. He was presumed postictal on EMS arrival and had a seizure during transport, for which 2.5 mg Versed was given. He was unresponsive on arrival to the ED, but not seizing. Postictal state began resolving and he was able to get up with assistance to go to the toilet by 9:30 PM.   CT showed an early-subacute appearing region of cortical hypodensity in the right parietal lobe, suspicious for subacute stroke or seizure-related edema.   His PMHx includes gout, HLD, DVT on chronic anticoagulation, anoxic brain injury, systolic CHF.    Past Medical History:  Diagnosis Date  . Alcohol abuse   . Gout   . Hyperlipemia   . Hypertension     Past Surgical History:  Procedure Laterality Date  . RADIOLOGY WITH ANESTHESIA N/A 07/17/2015   Procedure: RADIOLOGY WITH ANESTHESIA;  Surgeon: Medication Radiologist, MD;  Location: [facility];  Service: Radiology;  Laterality: N/A;    Family History  Problem Relation Age of Onset  . Aneurysm Sister   . Clotting disorder Brother   . Aneurysm Mother               Social History:  reports that he has been smoking cigars. He has never used smokeless tobacco. He reports that he does not drink alcohol or use drugs.  Allergies  Allergen Reactions  . Hydrochlorothiazide     REACTION: precipitates gout  . Pseudoephedrine     REACTION: rash    MEDICATIONS:                                                                                                                      Prior  to Admission:  Medications Prior to Admission  Medication Sig Dispense Refill Last Dose  . acetaminophen (TYLENOL) 325 MG tablet Take 2 tablets (650 mg total) by mouth every 6 (six) hours as needed for mild pain, moderate pain or fever (or Fever >/= 101).   unk at prn  . COLCRYS 0.6 MG tablet Take 0.6 mg by mouth 2 (two) times a week.   3 06/07/2018 at Unknown time  . folic acid (FOLVITE) 1 MG tablet Take 1 tablet (1 mg total) by mouth daily. 30 tablet 0 06/07/2018 at Unknown time  . furosemide (LASIX) 20 MG tablet Take 20 mg by mouth daily.  3 06/07/2018 at Unknown time  . metoprolol tartrate (LOPRESSOR) 50 MG tablet Take 1 tablet (50 mg total) by mouth 2 (two) times daily.   06/07/2018 at 1030am  . multivitamin (PROSIGHT) TABS tablet Take 1 tablet by mouth daily. 30 each 0 06/07/2018 at Unknown time  . pantoprazole (PROTONIX) 40 MG tablet Take 1 tablet (40 mg total) by mouth daily. 30 tablet 0 06/07/2018 at Unknown time  . potassium chloride (KLOR-CON M15) 15 MEQ tablet Take 2 tablets (30 mEq total) by mouth daily.   06/07/2018 at Unknown time  . rivaroxaban (XARELTO) 20 MG TABS tablet Take 1 tablet (20 mg total) by mouth daily with supper. (Patient taking differently: Take 20 mg by mouth daily. ) 30 tablet  06/07/2018 at 1030am  . thiamine 100 MG tablet Take 1 tablet (100 mg total) by mouth daily. 30 tablet 0 06/07/2018 at Unknown time  . ULORIC 80 MG TABS Take 1 tablet by mouth daily.   1 06/07/2018 at Unknown time   Scheduled: . aspirin  300 mg Rectal Daily   Or  . aspirin  325 mg Oral Daily  . atorvastatin  40 mg Oral q1800  . colchicine  0.6 mg Oral Once per day on Mon Thu  . febuxostat  80 mg Oral Daily  . folic acid  1 mg Oral Daily  . furosemide  20 mg Oral Daily  . metoprolol tartrate  50 mg Oral BID  . multivitamin  1 tablet Oral Daily  . pantoprazole  40 mg Oral Daily  . potassium chloride SA  30 mEq Oral Daily  . rivaroxaban  20 mg Oral Daily   . thiamine  100 mg Oral Daily   Continuous: . sodium chloride 75 mL/hr at 06/08/18 0030    ROS:                                                                                                                                       Patient denies headache, CP or abdominal pain. Has had cough, fever and chills. No N/V.   Blood pressure 126/81, pulse 75, temperature 98.9 F (37.2 C), temperature source Oral, resp. rate 18, height 5\' 8"  (1.727 m), weight 80.9 kg, SpO2 98 %.  General Examination:  Physical Exam  HEENT-  [place]/AT. Skin to forehead is warm and clammy Lungs- Respirations unlabored Extremities- Warm and well perfused. Multiple joint deformities secondary to chronic gout noted.   Neurological Examination Mental Status: Drowsy. Oriented to city and state, not day, month or year. Comprehension for basic commands is intact. Fluent in the context of paucity of speech. Naming intact. Repetition intact. Poor attention.  Cranial Nerves: II: PERRL. Left inferior quadrantanopsia. Extinction in left hemifield with DSS. ,  III,IV, VI: Ptosis not present, visual pursuits to right and left intact with exotropia and left medial rectus intermittent weakness noted.  V,VII: Smile symmetric, facial temp sensation equal bilaterally VIII: hearing intact to voice IX,X: no hypophonia XI: Symmetric  XII: midline tongue extension Motor: 4+/5 x 4 with poor effort Sensory: Temp sensation intact bilaterally. FT intact bilaterally except for extinction on the left to DSS.  Deep Tendon Reflexes: Deferred due to gout with risk of significant pain Cerebellar: Mild past-pointing versus optic ataxia on the left with FNF Gait: Deferred   Lab Results: Basic Metabolic Panel: Recent Labs  Lab 06/07/18 1645 06/07/18 1655 06/08/18 0522  NA 142 140 139  K 4.2 4.9 3.8  CL 108 110 114*  CO2 15*  --  17*   GLUCOSE 133* 128* 89  BUN 10 15 10   CREATININE 2.58* 2.40* 2.04*  CALCIUM 8.4*  --  7.3*    CBC: Recent Labs  Lab 06/07/18 1645 06/07/18 1655  WBC 5.9  --   NEUTROABS 4.5  --   HGB 12.8* 14.3  HCT 42.0 42.0  MCV 101.0*  --   PLT 164  --     Cardiac Enzymes: Recent Labs  Lab 06/07/18 1645  CKTOTAL 188    Lipid Panel: Recent Labs  Lab 06/08/18 0002  CHOL 165  TRIG 87  HDL 86  CHOLHDL 1.9  VLDL 17  LDLCALC 62    Imaging: Ct Head Wo Contrast  Result Date: 06/07/2018 CLINICAL DATA:  Altered LOC, witnessed seizure EXAM: CT HEAD WITHOUT CONTRAST CT CERVICAL SPINE WITHOUT CONTRAST TECHNIQUE: Multidetector CT imaging of the head and cervical spine was performed following the standard protocol without intravenous contrast. Multiplanar CT image reconstructions of the cervical spine were also generated. COMPARISON:  MRI 07/17/2015, CT brain 07/08/2015 FINDINGS: CT HEAD FINDINGS Brain: Hypodensity with loss of the gray-white matter differentiation in the right high parietal lobe consistent with acute to subacute infarct. No hemorrhage. No mass. Mild atrophy. Stable ventricle size. Vascular: No hyperdense vessels.  Carotid vascular calcification Skull: Normal. Negative for fracture or focal lesion. Sinuses/Orbits: No acute finding. Mucous retention cyst in the left maxillary sinus. Other: None CT CERVICAL SPINE FINDINGS Alignment: Motion degraded study which limits evaluation for fracture. Facet alignment within normal limits. Skull base and vertebrae: Craniovertebral junction is intact. No obvious fracture allowing for motion Soft tissues and spinal canal: No prevertebral fluid or swelling. No visible canal hematoma. Disc levels: Moderate degenerative change at C3-C4, C5-C6 and C6-C7. Upper chest: Negative. Other: None IMPRESSION: 1. Hypodensity within the high right parietal lobe, consistent with acute to subacute infarct. No hemorrhage. Atrophy. 2. Motion degraded images of the  cervical spine which limits evaluation for fracture. No definite acute osseous abnormality is seen. Electronically Signed   By: Donavan Foil M.D.   On: 06/07/2018 19:41   Ct Cervical Spine Wo Contrast  Result Date: 06/07/2018 CLINICAL DATA:  Altered LOC, witnessed seizure EXAM: CT HEAD WITHOUT CONTRAST CT CERVICAL SPINE WITHOUT CONTRAST TECHNIQUE: Multidetector CT  imaging of the head and cervical spine was performed following the standard protocol without intravenous contrast. Multiplanar CT image reconstructions of the cervical spine were also generated. COMPARISON:  MRI 07/17/2015, CT brain 07/08/2015 FINDINGS: CT HEAD FINDINGS Brain: Hypodensity with loss of the gray-white matter differentiation in the right high parietal lobe consistent with acute to subacute infarct. No hemorrhage. No mass. Mild atrophy. Stable ventricle size. Vascular: No hyperdense vessels.  Carotid vascular calcification Skull: Normal. Negative for fracture or focal lesion. Sinuses/Orbits: No acute finding. Mucous retention cyst in the left maxillary sinus. Other: None CT CERVICAL SPINE FINDINGS Alignment: Motion degraded study which limits evaluation for fracture. Facet alignment within normal limits. Skull base and vertebrae: Craniovertebral junction is intact. No obvious fracture allowing for motion Soft tissues and spinal canal: No prevertebral fluid or swelling. No visible canal hematoma. Disc levels: Moderate degenerative change at C3-C4, C5-C6 and C6-C7. Upper chest: Negative. Other: None IMPRESSION: 1. Hypodensity within the high right parietal lobe, consistent with acute to subacute infarct. No hemorrhage. Atrophy. 2. Motion degraded images of the cervical spine which limits evaluation for fracture. No definite acute osseous abnormality is seen. Electronically Signed   By: Donavan Foil M.D.   On: 06/07/2018 19:41   Dg Chest Port 1 View  Result Date: 06/07/2018 CLINICAL DATA:  Seizure today EXAM: PORTABLE CHEST 1 VIEW  COMPARISON:  July 26, 2015 FINDINGS: The heart size and mediastinal contours are within normal limits. Aorta is tortuous both lungs are clear. The visualized skeletal structures are stable. IMPRESSION: No active cardiopulmonary disease. Electronically Signed   By: Abelardo Diesel M.D.   On: 06/07/2018 16:56    Assessment: New onset seizure  1. DDx includes EtOH withdrawal, seizure due to new right parietal lobe stroke or idiopathic  2. CT with subacute-appearing right parietal region of cortical hypodensity. DDx stroke versus cortical edema secondary to prolonged subclinical seizure activity.  3. Complaints of cough, fever and chills for 2 weeks.  4. DVT on chronic anticoagulation with Xarelto  Recommendations: 1. EEG.  2. MRI brain 3. Scheduled Ativan 2 mg IV q8h in addition to CIWA protocol.  4. Infectious disease work up given complaints of cough, fever and chills for 2 weeks. Skin is warm and clammy on exam.  5. If MRI consistent with cortical edema due to seizure rather than stroke, discontinue ASA as its combination with Xarelto increases the risk for hemorrhage.    Electronically signed: Dr. Kerney Elbe 06/08/2018, 7:33 AM

## 2018-06-09 DIAGNOSIS — I5043 Acute on chronic combined systolic (congestive) and diastolic (congestive) heart failure: Secondary | ICD-10-CM

## 2018-06-09 DIAGNOSIS — F1210 Cannabis abuse, uncomplicated: Secondary | ICD-10-CM

## 2018-06-09 DIAGNOSIS — R4189 Other symptoms and signs involving cognitive functions and awareness: Secondary | ICD-10-CM

## 2018-06-09 DIAGNOSIS — E512 Wernicke's encephalopathy: Secondary | ICD-10-CM

## 2018-06-09 LAB — BASIC METABOLIC PANEL
Anion gap: 11 (ref 5–15)
BUN: 11 mg/dL (ref 8–23)
CHLORIDE: 113 mmol/L — AB (ref 98–111)
CO2: 16 mmol/L — AB (ref 22–32)
Calcium: 7.6 mg/dL — ABNORMAL LOW (ref 8.9–10.3)
Creatinine, Ser: 2.01 mg/dL — ABNORMAL HIGH (ref 0.61–1.24)
GFR calc Af Amer: 38 mL/min — ABNORMAL LOW (ref >60)
GFR calc non Af Amer: 32 mL/min — ABNORMAL LOW (ref >60)
GLUCOSE: 77 mg/dL (ref 70–99)
Potassium: 3.9 mmol/L (ref 3.5–5.1)
Sodium: 140 mmol/L (ref 135–145)

## 2018-06-09 LAB — HIV ANTIBODY (ROUTINE TESTING W REFLEX): HIV Screen 4th Generation wRfx: NONREACTIVE

## 2018-06-09 MED ORDER — RIVAROXABAN 15 MG PO TABS
15.0000 mg | ORAL_TABLET | Freq: Every day | ORAL | Status: DC
  Administered 2018-06-10 – 2018-06-13 (×4): 15 mg via ORAL
  Filled 2018-06-09 (×4): qty 1

## 2018-06-09 MED ORDER — THIAMINE HCL 100 MG/ML IJ SOLN
500.0000 mg | Freq: Three times a day (TID) | INTRAVENOUS | Status: AC
  Administered 2018-06-09 – 2018-06-12 (×8): 500 mg via INTRAVENOUS
  Filled 2018-06-09 (×9): qty 5

## 2018-06-09 MED ORDER — LEVETIRACETAM 500 MG PO TABS
500.0000 mg | ORAL_TABLET | Freq: Two times a day (BID) | ORAL | Status: DC
  Administered 2018-06-09 – 2018-06-14 (×10): 500 mg via ORAL
  Filled 2018-06-09 (×10): qty 1

## 2018-06-09 NOTE — Progress Notes (Signed)
OT Cancellation Note  Patient Details Name: Mitchell [name] MRN: [id number] DOB: 1951-03-25   Cancelled Treatment:    Reason Eval/Treat Not Completed: Patient not medically ready/pt lethargic.  Patient given ativan this afternoon after being combative with nursing staff, patient asleep.  Will continue to follow.   Delight Stare, OT Acute Rehabilitation Services Pager [phone] Office [phone]   Delight Stare 06/09/2018, 4:42 PM

## 2018-06-09 NOTE — Progress Notes (Signed)
[facility]                                                                              Patient Demographics  Mitchell [name], is a 67 y.o. male, DOB - Dec 28, 1950, WEX:[id number]  Admit date - 06/07/2018   Admitting Physician Elwyn Reach, MD  Outpatient Primary MD for the patient is [name], MD  Outpatient specialists:   LOS - 2  days   Medical records reviewed and are as summarized below:    Chief Complaint  Patient presents with  . 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  He was suspected to have a seizure due to alcohol withdrawal. Patient was last seen normal at 3:30 AM around 4 AM his wife found him unresponsive.  She called paramedics who came by and try to bring patient into the hospital.  In route he had seizure and was given 2.5 mg of Versed.  CT head showed acute to subacute CVA, neurology was consulted.   Assessment & Plan    Principal Problem: Acute/subacute CVA (cerebral vascular accident) ([place]) -Presented with unresponsive episode.  CT head showed hypodensity in the high right parietal lobe consistent with acute to subacute infarct -Alert and awake today, not a good historian, MRI brain, MRA pending -Follow 2D echo, stroke work-up, PT OT evaluation -Speech therapy evaluation recommended home health SLP -Currently on aspirin, Xarelto, statin -LDL 62 A1c 5.2 -Per neurology, if MRI consistent with cortical edema due to seizure, rather than CVA, discontinue aspirin 06/09/2018: Aspirin discontinued.  Updated patient and patient's wife in the presence of patient's nurse.    Active Problems: Seizure: Possibly due to alcohol versus CVA -Patient was placed on Keppra in ED. currently on CIWA scale with Ativan -Continue seizure precaution 06/09/2018: Patient  advised not to drive.  Any other seizure precautions.  Acute kidney injury on chronic kidney disease stage III -Patient presented with creatinine function of 2.5, lactic acidosis 13.0 -Hold furosemide today, continue gentle hydration for 2 L then KVO -Baseline creatinine ~ 2.0 -06/09/2018: Acute kidney injury is resolving.  Lactic acidosis -Likely due to seizures, lactic acid 13.0 at the time of admission, CK normal, -Improved, 0.9, continue gentle hydration today    Hyperlipidemia -LDL 62, continue Lipitor    Gout, unspecified -Stable, no acute issues, continue Uloric    HYPERTENSION, BENIGN SYSTEMIC -BP currently stable, continue Lopressor    Chronic systolic CHF (congestive heart failure) (HCC) -Currently compensated, euvolemic -Holding Lasix for now.    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  Code Status: Full CODE STATUS DVT Prophylaxis: Xarelto Family Communication: Discussed in detail with the patient and patient's wife.  Disposition Plan: Await physical therapy input.    Time Spent in minutes 45  Procedures:  MRI brain  Consultants:   Neurology  Antimicrobials:      Medications  Scheduled Meds: .  aspirin  300 mg Rectal Daily   Or  . aspirin  325 mg Oral Daily  . atorvastatin  40 mg Oral q1800  . colchicine  0.6 mg Oral Once per day on Mon Thu  . febuxostat  80 mg Oral Daily  . folic acid  1 mg Oral Daily  . levETIRAcetam  500 mg Oral BID  . metoprolol tartrate  50 mg Oral BID  . multivitamin  1 tablet Oral Daily  . pantoprazole  40 mg Oral Daily  . rivaroxaban  20 mg Oral Daily   Continuous Infusions: . sodium chloride 75 mL/hr at 06/08/18 2105  . thiamine injection     PRN Meds:.acetaminophen **OR** acetaminophen (TYLENOL) oral liquid 160 mg/5 mL **OR** acetaminophen, LORazepam **OR** LORazepam, senna-docusate   Antibiotics   Anti-infectives  (From admission, onward)   Start     Dose/Rate Route Frequency Ordered Stop   06/07/18 1715  piperacillin-tazobactam (ZOSYN) IVPB 3.375 g     3.375 g 100 mL/hr over 30 Minutes Intravenous  Once 06/07/18 1705 06/07/18 1813   06/07/18 1715  vancomycin (VANCOCIN) IVPB 1000 mg/200 mL premix     1,000 mg 200 mL/hr over 60 Minutes Intravenous  Once 06/07/18 1705 06/07/18 1923        Subjective:   Mitchell Kataoka was seen and examined today.  Still somewhat confused, slow in speaking, poor historian.  Difficulty obtaining review of system.  Objective:   Vitals:   06/09/18 0442 06/09/18 0754 06/09/18 1012 06/09/18 1122  BP: (!) 167/65 (!) 147/98 (!) 143/72 111/85  Pulse: (!) 55 (!) 57 67 81  Resp: 20 20  20   Temp: 98.6 F (37 C) 98 F (36.7 C)  98.2 F (36.8 C)  TempSrc: Oral Axillary  Oral  SpO2: 100% 94%  99%  Weight:      Height:        Intake/Output Summary (Last 24 hours) at 06/09/2018 1238 Last data filed at 06/09/2018 0815 Gross per 24 hour  Intake 1595.79 ml  Output -  Net 1595.79 ml     Wt Readings from Last 3 Encounters:  06/07/18 80.9 kg  03/09/17 86.2 kg  08/14/15 73.7 kg     Exam  General: Alert and oriented x self and place, NAD  Eyes:   HEENT:  Atraumatic, normocephalic,  Cardiovascular: S1 S2 auscultated, Regular rate and rhythm.  Respiratory: Clear to auscultation bilaterally, no wheezing, rales or rhonchi  Gastrointestinal: Soft, nontender, nondistended, + bowel sounds  Ext: no pedal edema bilaterally  Neuro: appears to have somewhat global weakness  Musculoskeletal: No digital cyanosis, clubbing   Data Reviewed:  I have personally reviewed following labs and imaging studies  Micro Results Recent Results (from the past 240 hour(s))  Blood Culture (routine x 2)     Status: None (Preliminary result)   Collection Time: 06/07/18  5:30 PM  Result Value Ref Range Status   Specimen Description BLOOD RIGHT ANTECUBITAL  Final   Special Requests    Final    BOTTLES DRAWN AEROBIC AND ANAEROBIC Blood Culture adequate volume   Culture   Final    NO GROWTH 2 DAYS Performed at [facility], [address], [place], [place] [address]    Report Status PENDING  Incomplete  Blood Culture (routine x 2)     Status: None (Preliminary result)   Collection Time: 06/07/18  5:30 PM  Result Value Ref Range Status   Specimen Description BLOOD RIGHT FOREARM  Final  Special Requests   Final    BOTTLES DRAWN AEROBIC AND ANAEROBIC Blood Culture adequate volume   Culture   Final    NO GROWTH 2 DAYS Performed at [facility], [place] [address] [place], [place] [address]    Report Status PENDING  Incomplete    Radiology Reports Ct Head Wo Contrast  Result Date: 06/07/2018 CLINICAL DATA:  Altered LOC, witnessed seizure EXAM: CT HEAD WITHOUT CONTRAST CT CERVICAL SPINE WITHOUT CONTRAST TECHNIQUE: Multidetector CT imaging of the head and cervical spine was performed following the standard protocol without intravenous contrast. Multiplanar CT image reconstructions of the cervical spine were also generated. COMPARISON:  MRI 07/17/2015, CT brain 07/08/2015 FINDINGS: CT HEAD FINDINGS Brain: Hypodensity with loss of the gray-white matter differentiation in the right high parietal lobe consistent with acute to subacute infarct. No hemorrhage. No mass. Mild atrophy. Stable ventricle size. Vascular: No hyperdense vessels.  Carotid vascular calcification Skull: Normal. Negative for fracture or focal lesion. Sinuses/Orbits: No acute finding. Mucous retention cyst in the left maxillary sinus. Other: None CT CERVICAL SPINE FINDINGS Alignment: Motion degraded study which limits evaluation for fracture. Facet alignment within normal limits. Skull base and vertebrae: Craniovertebral junction is intact. No obvious fracture allowing for motion Soft tissues and spinal canal: No prevertebral fluid or swelling. No visible canal hematoma. Disc levels: Moderate degenerative  change at C3-C4, C5-C6 and C6-C7. Upper chest: Negative. Other: None IMPRESSION: 1. Hypodensity within the high right parietal lobe, consistent with acute to subacute infarct. No hemorrhage. Atrophy. 2. Motion degraded images of the cervical spine which limits evaluation for fracture. No definite acute osseous abnormality is seen. Electronically Signed   By: Donavan Foil M.D.   On: 06/07/2018 19:41   Ct Cervical Spine Wo Contrast  Result Date: 06/07/2018 CLINICAL DATA:  Altered LOC, witnessed seizure EXAM: CT HEAD WITHOUT CONTRAST CT CERVICAL SPINE WITHOUT CONTRAST TECHNIQUE: Multidetector CT imaging of the head and cervical spine was performed following the standard protocol without intravenous contrast. Multiplanar CT image reconstructions of the cervical spine were also generated. COMPARISON:  MRI 07/17/2015, CT brain 07/08/2015 FINDINGS: CT HEAD FINDINGS Brain: Hypodensity with loss of the gray-white matter differentiation in the right high parietal lobe consistent with acute to subacute infarct. No hemorrhage. No mass. Mild atrophy. Stable ventricle size. Vascular: No hyperdense vessels.  Carotid vascular calcification Skull: Normal. Negative for fracture or focal lesion. Sinuses/Orbits: No acute finding. Mucous retention cyst in the left maxillary sinus. Other: None CT CERVICAL SPINE FINDINGS Alignment: Motion degraded study which limits evaluation for fracture. Facet alignment within normal limits. Skull base and vertebrae: Craniovertebral junction is intact. No obvious fracture allowing for motion Soft tissues and spinal canal: No prevertebral fluid or swelling. No visible canal hematoma. Disc levels: Moderate degenerative change at C3-C4, C5-C6 and C6-C7. Upper chest: Negative. Other: None IMPRESSION: 1. Hypodensity within the high right parietal lobe, consistent with acute to subacute infarct. No hemorrhage. Atrophy. 2. Motion degraded images of the cervical spine which limits evaluation for fracture.  No definite acute osseous abnormality is seen. Electronically Signed   By: Donavan Foil M.D.   On: 06/07/2018 19:41   Mr Jodene Nam Head Wo Contrast  Result Date: 06/08/2018 CLINICAL DATA:  Stroke.  Seizure.  Found unresponsive. EXAM: MRI HEAD WITHOUT CONTRAST MRA HEAD WITHOUT CONTRAST TECHNIQUE: Multiplanar, multiecho pulse sequences of the brain and surrounding structures were obtained without intravenous contrast. Angiographic images of the head were obtained using MRA technique without contrast. COMPARISON:  CT head  06/07/2018 FINDINGS: MRI HEAD FINDINGS Brain: Negative for acute infarct. Chronic infarct with laminar necrosis right parietal lobe corresponding to the hypodensity on CT. Mild associated hemorrhage in the right parietal infarct. Mild chronic microvascular ischemic change in the white matter. Mild generalized atrophy. Negative for mass or midline shift. Vascular: Normal arterial flow voids. Skull and upper cervical spine: Negative Sinuses/Orbits: Mild mucosal edema paranasal sinuses.  Normal orbit Other: None MRA HEAD FINDINGS Both vertebral arteries are patent to the basilar without stenosis. PICA patent bilaterally. Basilar widely patent. Superior cerebellar arteries are patent bilaterally. Fetal origin of the posterior cerebral artery bilaterally with hypoplastic P1 segments bilaterally. Internal carotid artery widely patent bilaterally. Anterior and middle cerebral arteries patent bilaterally without stenosis. Negative for cerebral aneurysm. IMPRESSION: Negative for acute infarct Chronic infarct right parietal lobe. Mild chronic microvascular ischemic change in the white matter Negative MRA head Electronically Signed   By: Franchot Gallo M.D.   On: 06/08/2018 10:59   Mr Brain Wo Contrast  Result Date: 06/08/2018 CLINICAL DATA:  Stroke.  Seizure.  Found unresponsive. EXAM: MRI HEAD WITHOUT CONTRAST MRA HEAD WITHOUT CONTRAST TECHNIQUE: Multiplanar, multiecho pulse sequences of the brain and  surrounding structures were obtained without intravenous contrast. Angiographic images of the head were obtained using MRA technique without contrast. COMPARISON:  CT head 06/07/2018 FINDINGS: MRI HEAD FINDINGS Brain: Negative for acute infarct. Chronic infarct with laminar necrosis right parietal lobe corresponding to the hypodensity on CT. Mild associated hemorrhage in the right parietal infarct. Mild chronic microvascular ischemic change in the white matter. Mild generalized atrophy. Negative for mass or midline shift. Vascular: Normal arterial flow voids. Skull and upper cervical spine: Negative Sinuses/Orbits: Mild mucosal edema paranasal sinuses.  Normal orbit Other: None MRA HEAD FINDINGS Both vertebral arteries are patent to the basilar without stenosis. PICA patent bilaterally. Basilar widely patent. Superior cerebellar arteries are patent bilaterally. Fetal origin of the posterior cerebral artery bilaterally with hypoplastic P1 segments bilaterally. Internal carotid artery widely patent bilaterally. Anterior and middle cerebral arteries patent bilaterally without stenosis. Negative for cerebral aneurysm. IMPRESSION: Negative for acute infarct Chronic infarct right parietal lobe. Mild chronic microvascular ischemic change in the white matter Negative MRA head Electronically Signed   By: Franchot Gallo M.D.   On: 06/08/2018 10:59   Dg Chest Port 1 View  Result Date: 06/07/2018 CLINICAL DATA:  Seizure today EXAM: PORTABLE CHEST 1 VIEW COMPARISON:  July 26, 2015 FINDINGS: The heart size and mediastinal contours are within normal limits. Aorta is tortuous both lungs are clear. The visualized skeletal structures are stable. IMPRESSION: No active cardiopulmonary disease. Electronically Signed   By: Abelardo Diesel M.D.   On: 06/07/2018 16:56    Lab Data:  CBC: Recent Labs  Lab 06/07/18 1645 06/07/18 1655  WBC 5.9  --   NEUTROABS 4.5  --   HGB 12.8* 14.3  HCT 42.0 42.0  MCV 101.0*  --   PLT 164   --    Basic Metabolic Panel: Recent Labs  Lab 06/07/18 1645 06/07/18 1655 06/08/18 0522 06/09/18 0459  NA 142 140 139 140  K 4.2 4.9 3.8 3.9  CL 108 110 114* 113*  CO2 15*  --  17* 16*  GLUCOSE 133* 128* 89 77  BUN 10 15 10 11   CREATININE 2.58* 2.40* 2.04* 2.01*  CALCIUM 8.4*  --  7.3* 7.6*   GFR: Estimated Creatinine Clearance: 34 mL/min (A) (by C-G formula based on SCr of 2.01 mg/dL (H)). Liver Function Tests: Recent  Labs  Lab 06/07/18 1645  AST 44*  ALT 11  ALKPHOS 92  BILITOT 1.0  PROT 7.1  ALBUMIN 3.2*   No results for input(s): LIPASE, AMYLASE in the last 168 hours. No results for input(s): AMMONIA in the last 168 hours. Coagulation Profile: No results for input(s): INR, PROTIME in the last 168 hours. Cardiac Enzymes: Recent Labs  Lab 06/07/18 1645  CKTOTAL 188   BNP (last 3 results) No results for input(s): PROBNP in the last 8760 hours. HbA1C: Recent Labs    06/08/18 0002  HGBA1C 5.2   CBG: Recent Labs  Lab 06/07/18 1621  GLUCAP 120*   Lipid Profile: Recent Labs    06/08/18 0002  CHOL 165  HDL 86  LDLCALC 62  TRIG 87  CHOLHDL 1.9   Thyroid Function Tests: No results for input(s): TSH, T4TOTAL, FREET4, T3FREE, THYROIDAB in the last 72 hours. Anemia Panel: No results for input(s): VITAMINB12, FOLATE, FERRITIN, TIBC, IRON, RETICCTPCT in the last 72 hours. Urine analysis:    Component Value Date/Time   COLORURINE YELLOW 06/07/2018 1855   APPEARANCEUR HAZY (A) 06/07/2018 1855   LABSPEC 1.012 06/07/2018 1855   PHURINE 5.0 06/07/2018 1855   GLUCOSEU NEGATIVE 06/07/2018 1855   HGBUR LARGE (A) 06/07/2018 1855   BILIRUBINUR NEGATIVE 06/07/2018 [place] NEGATIVE 06/07/2018 1855   PROTEINUR 100 (A) 06/07/2018 1855   UROBILINOGEN 1.0 07/19/2015 1739   NITRITE NEGATIVE 06/07/2018 1855   LEUKOCYTESUR NEGATIVE 06/07/2018 1855     Bonnell Public M.D. [facility] 06/09/2018, 12:38 PM  Between 7am to 7pm - call Pager -  [phone].  After 7pm go to [email or web address] - password [email or web address]  Call night coverage person covering after 7pm

## 2018-06-09 NOTE — Progress Notes (Signed)
Attempted to assist pt to beside commode. Pt became aggressive, kicked bedside commode, and ripped off telemetry. Gave Ativan and assisted back into bed from bedside.

## 2018-06-09 NOTE — Care Management Important Message (Signed)
Important Message  Patient Details  Name: Mitchell [name] MRN: [id number] Date of Birth: 08-12-1951   Medicare Important Message Given:  Yes    Mitchell [name] [address] 06/09/2018, 3:31 PM

## 2018-06-09 NOTE — Progress Notes (Signed)
Brief History 67 YO male seen in conusltation by Dr Cheral Marker yesterday for possible seizure due to ETOH withdrawel. The patient was loaded with Keppra 06/07/2018 but no further doses given . Per Dr Carloyn Jaeger start Keppra 500 mg BID today. EEG yesterday was normal. MRI negative for acute stroke. Elevated creatinine 2.01 -> improving. Probable CKD as well.  PMHX - hypertension, hyperlipidemia, previous stroke, EF 30 to 35% with congestive heart failure history, anticoagulation for DVT history (Xarelto), gout, alcohol abuse, and history of cardiac arrest in the past with anoxic brain injury.    Subjective The patient's wife was at the bedside this morning.  She reports that the patient is very belligerent this morning and refused to take his morning medications.  He apparently has a long history of alcohol abuse and has had withdrawal symptoms in the past; although, she is not aware of any seizure activity.  There was no seizure activity noted throughout the night according to the patient's wife and his nurse.  I spoke to the patient regarding the importance of alcohol cessation and reinforced that there was help available.  He apparently has no interest in quitting drinking.  The patient's wife is very concerned.  Past Medical History Past Medical History:  Diagnosis Date  . Alcohol abuse   . Gout   . Hyperlipemia   . Hypertension     Past Surgical History Past Surgical History:  Procedure Laterality Date  . RADIOLOGY WITH ANESTHESIA N/A 07/17/2015   Procedure: RADIOLOGY WITH ANESTHESIA;  Surgeon: Medication Radiologist, MD;  Location: [place];  Service: Radiology;  Laterality: N/A;    Allergies Allergies  Allergen Reactions  . Hydrochlorothiazide     REACTION: precipitates gout  . Pseudoephedrine     REACTION: rash    Home Medications Medications Prior to Admission  Medication Sig Dispense Refill  . acetaminophen (TYLENOL) 325 MG tablet Take 2 tablets (650 mg total) by mouth every 6  (six) hours as needed for mild pain, moderate pain or fever (or Fever >/= 101).    . COLCRYS 0.6 MG tablet Take 0.6 mg by mouth 2 (two) times a week.   3  . folic acid (FOLVITE) 1 MG tablet Take 1 tablet (1 mg total) by mouth daily. 30 tablet 0  . furosemide (LASIX) 20 MG tablet Take 20 mg by mouth daily.  3  . metoprolol tartrate (LOPRESSOR) 50 MG tablet Take 1 tablet (50 mg total) by mouth 2 (two) times daily.    . multivitamin (PROSIGHT) TABS tablet Take 1 tablet by mouth daily. 30 each 0  . pantoprazole (PROTONIX) 40 MG tablet Take 1 tablet (40 mg total) by mouth daily. 30 tablet 0  . potassium chloride (KLOR-CON M15) 15 MEQ tablet Take 2 tablets (30 mEq total) by mouth daily.    . rivaroxaban (XARELTO) 20 MG TABS tablet Take 1 tablet (20 mg total) by mouth daily with supper. (Patient taking differently: Take 20 mg by mouth daily. ) 30 tablet   . thiamine 100 MG tablet Take 1 tablet (100 mg total) by mouth daily. 30 tablet 0  . ULORIC 80 MG TABS Take 1 tablet by mouth daily.   1    Hospital Medications . aspirin  300 mg Rectal Daily   Or  . aspirin  325 mg Oral Daily  . atorvastatin  40 mg Oral q1800  . colchicine  0.6 mg Oral Once per day on Mon Thu  . febuxostat  80 mg Oral Daily  . folic  acid  1 mg Oral Daily  . levETIRAcetam  500 mg Oral BID  . metoprolol tartrate  50 mg Oral BID  . multivitamin  1 tablet Oral Daily  . pantoprazole  40 mg Oral Daily  . rivaroxaban  20 mg Oral Daily  . thiamine  100 mg Oral Daily       Intake/Output from previous day: 09/12 0701 - 09/13 0700 In: 1535.8 [I.V.:1535.8] Out: -  Intake/Output this shift: Total I/O In: 60 [P.O.:60] Out: -  Nutritional status:  Diet Order            Diet Heart Room service appropriate? Yes; Fluid consistency: Thin  Diet effective now             Physical Exam  Vitals:   06/08/18 2041 06/09/18 0001 06/09/18 0442 06/09/18 0754  BP: 133/85 (!) 129/56 (!) 167/65 (!) 147/98  Pulse: 60 (!) 53 (!) 55 (!)  57  Resp: 18 18 20 20   Temp: 98.4 F (36.9 C) 99.7 F (37.6 C) 98.6 F (37 C) 98 F (36.7 C)  TempSrc: Oral Oral Oral Axillary  SpO2: 98% 100% 100% 94%  Weight:      Height:       General: Awake alert, but not cooperative the exam: HEENT-normal cephalic atraumatic CVS: E2-C0 heard regular rate rhythm Neurological exam Patient is awake, alert but not cooperative with exam. On asking his name, he said I forgot. On asking him who his wife was, he said no. He was able to move all 4 extremities. He was able to resist pulling his arms and legs away.   LABORATORY RESULTS:  Basic Metabolic Panel: Recent Labs  Lab 06/07/18 1645 06/07/18 1655 06/08/18 0522 06/09/18 0459  NA 142 140 139 140  K 4.2 4.9 3.8 3.9  CL 108 110 114* 113*  CO2 15*  --  17* 16*  GLUCOSE 133* 128* 89 77  BUN 10 15 10 11   CREATININE 2.58* 2.40* 2.04* 2.01*  CALCIUM 8.4*  --  7.3* 7.6*    Liver Function Tests: Recent Labs  Lab 06/07/18 1645  AST 44*  ALT 11  ALKPHOS 92  BILITOT 1.0  PROT 7.1  ALBUMIN 3.2*   No results for input(s): LIPASE, AMYLASE in the last 168 hours. No results for input(s): AMMONIA in the last 168 hours.  CBC: Recent Labs  Lab 06/07/18 1645 06/07/18 1655  WBC 5.9  --   NEUTROABS 4.5  --   HGB 12.8* 14.3  HCT 42.0 42.0  MCV 101.0*  --   PLT 164  --     Cardiac Enzymes: Recent Labs  Lab 06/07/18 1645  CKTOTAL 188    Lipid Panel: Recent Labs  Lab 06/08/18 0002  CHOL 165  TRIG 87  HDL 86  CHOLHDL 1.9  VLDL 17  LDLCALC 62    CBG: Recent Labs  Lab 06/07/18 1621  GLUCAP 120*    Microbiology:   Coagulation Studies: No results for input(s): LABPROT, INR in the last 72 hours.  Miscellaneous Labs:   IMAGING RESULTS   Ct Head Wo Contrast 06/07/2018 IMPRESSION:  Hypodensity within the high right parietal lobe, consistent with acute to subacute infarct. No hemorrhage. Atrophy.   Ct Cervical Spine Wo Contrast 06/07/2018 IMPRESSION:  Motion  degraded images of the cervical spine which limits evaluation for fracture. No definite acute osseous abnormality is seen.   Mr Jodene Nam Head Wo Contrast 06/08/2018 IMPRESSION: Negative for acute infarct Chronic infarct right parietal lobe.  Mild  chronic microvascular ischemic change in the white matter  Negative MRA Head   Dg Chest Port 1 View 06/07/2018 IMPRESSION:  No active cardiopulmonary disease.    EEG 06/08/2018 IMPRESSION: Normal electroencephalogram, awake, drowsy and asleep. There are no focal lateralizing or epileptiform features.  Transthoracic Echocardiogram 06/08/2018 Study Conclusions - Left ventricle: The cavity size was normal. Wall thickness was   normal. Systolic function was moderately to severely reduced. The   estimated ejection fraction was in the range of 30% to 35%.   Diffuse hypokinesis. Features are consistent with a pseudonormal   left ventricular filling pattern, with concomitant abnormal   relaxation and increased filling pressure (grade 2 diastolic   dysfunction). - Aortic valve: There was mild regurgitation.   Carotid Dopplers 06/08/2018 Final Interpretation: Right Carotid: Velocities in the right ICA are consistent with a 1-39% stenosis. Left Carotid: Velocities in the left ICA are consistent with a 1-39% stenosis. Vertebrals: Bilateral vertebral arteries demonstrate antegrade flow.    Assessment/Plan:  67 YO male seen in conusltation by Dr Cheral Marker yesterday for possible seizure due to ETOH withdrawel. The patient was loaded with Keppra 06/07/2018 but no further doses given . Per Dr Rory Percy start Keppra 500 mg BID today. EEG yesterday was normal. MRI negative for acute stroke-CT findings likely reflect chronic to late subacute stroke. Elevated creatinine 2.01 -> improving. Probable CKD as well. 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   PMHX  -hypertension, hyperlipidemia, previous stroke, EF 30 to 35% with congestive heart failure history, anticoagulation for DVT history (Xarelto PTA), gout, alcohol abuse, and history of cardiac arrest in the past with anoxic brain injury.  PLAN:  No acute stroke by MRI this admission.  No definite seizure activity; however, prophylaxis with Keppra is likely prudent although patient is not compliant with medications per wife's history.  High-dose thiamine added.   Continue aspirin -consider changing to 81 mg daily while on Xarelto.   Continue Lipitor.   Mikey Bussing PA-C [facility] Pager [phone] 06/09/2018, 11:12 AM   Attending Neurohospitalist Addendum Patient seen and examined with APP/Resident. Agree with the history and physical as documented above. Agree with the plan as documented, which I helped formulate. I have independently reviewed the chart, obtained history, review of systems and examined the patient.I have personally reviewed pertinent head/neck/spine imaging (CT/MRI). Please feel free to call with any questions. --- Amie Portland, MD [facility] Pager: [phone]  If 7pm to 7am, please call on call as listed on AMION.

## 2018-06-09 NOTE — Progress Notes (Signed)
Pt refusing to take medications. Attending MD notified.

## 2018-06-09 NOTE — Plan of Care (Signed)

## 2018-06-09 NOTE — Progress Notes (Signed)
PT Cancellation Note  Patient Details Name: Mitchell [name] MRN: [id number] DOB: December 10, 1950   Cancelled Treatment:    Reason Eval/Treat Not Completed: Patient not medically ready;Patient's level of consciousness Pt refused any mobility, just given ativan due to being combative with nursing staff. Per wife, does not feel safe taking him home in this state. Will follow as appropriate.    [name] 06/09/2018, 1:41 PM Wray Kearns, PT, DPT [facility] Pager [phone] Office [phone]

## 2018-06-10 ENCOUNTER — Inpatient Hospital Stay (HOSPITAL_COMMUNITY): Payer: Medicare Other

## 2018-06-10 LAB — RENAL FUNCTION PANEL
Albumin: 2.6 g/dL — ABNORMAL LOW (ref 3.5–5.0)
Anion gap: 9 (ref 5–15)
BUN: 8 mg/dL (ref 8–23)
CO2: 15 mmol/L — ABNORMAL LOW (ref 22–32)
Calcium: 7.7 mg/dL — ABNORMAL LOW (ref 8.9–10.3)
Chloride: 114 mmol/L — ABNORMAL HIGH (ref 98–111)
Creatinine, Ser: 1.58 mg/dL — ABNORMAL HIGH (ref 0.61–1.24)
GFR calc Af Amer: 50 mL/min — ABNORMAL LOW (ref >60)
GFR calc non Af Amer: 43 mL/min — ABNORMAL LOW (ref >60)
Glucose, Bld: 74 mg/dL (ref 70–99)
Phosphorus: 2.4 mg/dL — ABNORMAL LOW (ref 2.5–4.6)
Potassium: 4.5 mmol/L (ref 3.5–5.1)
Sodium: 138 mmol/L (ref 135–145)

## 2018-06-10 LAB — URINALYSIS, ROUTINE W REFLEX MICROSCOPIC
Bacteria, UA: NONE SEEN
Bilirubin Urine: NEGATIVE
Glucose, UA: NEGATIVE mg/dL
Ketones, ur: 5 mg/dL — AB
Leukocytes, UA: NEGATIVE
Nitrite: NEGATIVE
Protein, ur: 100 mg/dL — AB
RBC / HPF: >50 RBC/hpf — ABNORMAL HIGH (ref 0–5)
Specific Gravity, Urine: 1.011 (ref 1.005–1.030)
pH: 5 (ref 5.0–8.0)

## 2018-06-10 LAB — MAGNESIUM: Magnesium: 1.2 mg/dL — ABNORMAL LOW (ref 1.7–2.4)

## 2018-06-10 MED ORDER — MAGNESIUM SULFATE 50 % IJ SOLN
3.0000 g | Freq: Once | INTRAVENOUS | Status: AC
  Administered 2018-06-10: 3 g via INTRAVENOUS
  Filled 2018-06-10: qty 6

## 2018-06-10 MED ORDER — SODIUM CHLORIDE 0.9 % IV SOLN
INTRAVENOUS | Status: DC
  Administered 2018-06-10 – 2018-06-14 (×7): via INTRAVENOUS

## 2018-06-10 NOTE — Progress Notes (Signed)
OT Cancellation Note  Patient Details Name: Mitchell [name] MRN: [id number] DOB: 1950/12/22   Cancelled Treatment:     UNABLE TO AROUSE PATIENT  Mitchell [name] 06/10/2018, 8:54 AM

## 2018-06-10 NOTE — Progress Notes (Signed)
[facility]                                                                              Patient Demographics  Mitchell [name], is a 67 y.o. male, DOB - 03-24-1951, PZW:[id number]  Admit date - 06/07/2018   Admitting Physician Elwyn Reach, MD  Outpatient Primary MD for the patient is [name], MD  Outpatient specialists:   LOS - 3  days   Medical records reviewed and are as summarized below:    Chief Complaint  Patient presents with  . 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  He was suspected to have a seizure due to alcohol withdrawal. Patient was last seen normal at 3:30 AM around 4 AM his wife found him unresponsive.  She called paramedics who came by and try to bring patient into the hospital.  En route, patient had seizure and was given 2.5 mg of Versed.  MRI of the brain was negative for acute infarct, which revealed chronic infarct right parietal lobe, mild chronic microvascular ischemic change in the white matter.  MRA head was negative.    06/10/2018: Staff reported that it was difficult managing the patient's behavior last night.  Patient was said to be spilling feces all over the room.  Patient is currently sleeping quietly.  His wife reported episodes of visual hallucination.  Previous EKG done revealed prolonged QTc interval (530 ms).  Will repeat another EKG.  Will check electrolytes, and correct abnormal potassium or magnesium.  Will repeat EKG in the morning.  Will hold off on any antipsychotics for now.  Will avoid benzodiazepines if possible.   Assessment & Plan    Principal Problem: Acute/subacute CVA (cerebral vascular accident) ([place]) -Presented with unresponsive episode.  CT head showed hypodensity in the high right parietal lobe consistent with acute to  subacute infarct -Alert and awake today, not a good historian, MRI brain, MRA pending -Follow 2D echo, stroke work-up, PT OT evaluation -Speech therapy evaluation recommended home health SLP -Currently on aspirin, Xarelto, statin -LDL 62 A1c 5.2 -Per neurology, if MRI consistent with cortical edema due to seizure, rather than CVA, discontinue aspirin  06/10/2018: Staff reported that it was difficult managing the patient's behavior last night.  Patient was said to be spilling feces all over the room.  Patient is currently sleeping quietly.  His wife reported episodes of visual hallucination.  Previous EKG done revealed prolonged QTc interval (530 ms).  Will repeat another EKG.  Will check electrolytes, and correct abnormal potassium or magnesium.  Will repeat EKG in the morning.  Will hold off on any antipsychotics for now.  Will avoid benzodiazepines if possible.  Active Problems: Seizure: Possibly due to alcohol versus CVA -Patient was placed on Keppra in ED. currently on CIWA scale with Ativan -Continue seizure precaution Minimize benzodiazepine use.  Acute kidney injury on chronic kidney disease stage III -Patient presented with creatinine function of 2.5, lactic acidosis  13.0 -Hold furosemide today, continue gentle hydration for 2 L then KVO -Baseline creatinine ~ 2.0 -06/10/2018: Acute kidney injury is resolving.  Serum creatinine is down to 1.58.  Continue IV fluids.  Continue to monitor renal function and electrolytes.  Lactic acidosis -Likely due to seizures, lactic acid 13.0 at the time of admission, CK normal, -Improved, 0.9, continue gentle hydration today    Hyperlipidemia -LDL 62, continue Lipitor    Gout, unspecified -Stable, no acute issues, continue Uloric    HYPERTENSION, BENIGN SYSTEMIC -BP currently stable, continue Lopressor    Chronic systolic CHF (congestive heart failure) (HCC) -Currently compensated, euvolemic -Holding Lasix for now.    Alcohol  abuse -Patient counseled on alcohol cessation, continue CIWA scale with Ativan  Cannabis abuse:  Patient counseled.  Code Status: Full CODE. DVT Prophylaxis: Xarelto Family Communication: Discussed in detail with the patient and patient's wife.  Disposition Plan: Await physical therapy input.    Time Spent in minutes 35  Procedures:  MRI brain  Consultants:   Neurology  Antimicrobials:      Medications  Scheduled Meds: . atorvastatin  40 mg Oral q1800  . colchicine  0.6 mg Oral Once per day on Mon Thu  . febuxostat  80 mg Oral Daily  . folic acid  1 mg Oral Daily  . levETIRAcetam  500 mg Oral BID  . metoprolol tartrate  50 mg Oral BID  . multivitamin  1 tablet Oral Daily  . pantoprazole  40 mg Oral Daily  . rivaroxaban  15 mg Oral Daily   Continuous Infusions: . sodium chloride    . magnesium sulfate 1 - 4 g bolus IVPB    . thiamine injection 500 mg (06/10/18 1334)   PRN Meds:.acetaminophen **OR** acetaminophen (TYLENOL) oral liquid 160 mg/5 mL **OR** acetaminophen, LORazepam **OR** LORazepam, senna-docusate   Antibiotics   Anti-infectives (From admission, onward)   Start     Dose/Rate Route Frequency Ordered Stop   06/07/18 1715  piperacillin-tazobactam (ZOSYN) IVPB 3.375 g     3.375 g 100 mL/hr over 30 Minutes Intravenous  Once 06/07/18 1705 06/07/18 1813   06/07/18 1715  vancomycin (VANCOCIN) IVPB 1000 mg/200 mL premix     1,000 mg 200 mL/hr over 60 Minutes Intravenous  Once 06/07/18 1705 06/07/18 1923        Subjective:   Mitchell [name] was seen and examined today.  Patient is sleeping quietly.  The patient's behavior was said to have been difficult to manage last night.   Objective:   Vitals:   06/10/18 0317 06/10/18 0823 06/10/18 1316 06/10/18 1625  BP: (!) 171/72 (!) 175/90 139/77 123/70  Pulse: (!) 52 (!) 45 (!) 109 91  Resp: 18 18 18 18   Temp: 98.5 F (36.9 C) 98.2 F (36.8 C) 98.2 F (36.8 C) 98.4 F (36.9 C)  TempSrc: Oral Oral Oral  Oral  SpO2:  100% 99% 100%  Weight:      Height:        Intake/Output Summary (Last 24 hours) at 06/10/2018 1630 Last data filed at 06/10/2018 1200 Gross per 24 hour  Intake 480 ml  Output 75 ml  Net 405 ml     Wt Readings from Last 3 Encounters:  06/07/18 80.9 kg  03/09/17 86.2 kg  08/14/15 73.7 kg     Exam  General: Alert and oriented x self and place, NAD  Eyes:   HEENT:  Atraumatic, normocephalic,  Cardiovascular: S1 S2 auscultated, Regular rate and rhythm.  Respiratory: Clear  to auscultation bilaterally, no wheezing, rales or rhonchi  Gastrointestinal: Soft, nontender, nondistended, + bowel sounds  Ext: no pedal edema bilaterally  Neuro: Sleeping quietly.     Data Reviewed:  I have personally reviewed following labs and imaging studies  Micro Results Recent Results (from the past 240 hour(s))  Blood Culture (routine x 2)     Status: None (Preliminary result)   Collection Time: 06/07/18  5:30 PM  Result Value Ref Range Status   Specimen Description BLOOD RIGHT ANTECUBITAL  Final   Special Requests   Final    BOTTLES DRAWN AEROBIC AND ANAEROBIC Blood Culture adequate volume   Culture   Final    NO GROWTH 3 DAYS Performed at [facility], [address], [place], [place] [address]    Report Status PENDING  Incomplete  Blood Culture (routine x 2)     Status: None (Preliminary result)   Collection Time: 06/07/18  5:30 PM  Result Value Ref Range Status   Specimen Description BLOOD RIGHT FOREARM  Final   Special Requests   Final    BOTTLES DRAWN AEROBIC AND ANAEROBIC Blood Culture adequate volume   Culture   Final    NO GROWTH 3 DAYS Performed at [facility], [place] [address] [place], [place] [address]    Report Status PENDING  Incomplete    Radiology Reports Ct Head Wo Contrast  Result Date: 06/07/2018 CLINICAL DATA:  Altered LOC, witnessed seizure EXAM: CT HEAD WITHOUT CONTRAST CT CERVICAL SPINE WITHOUT CONTRAST TECHNIQUE:  Multidetector CT imaging of the head and cervical spine was performed following the standard protocol without intravenous contrast. Multiplanar CT image reconstructions of the cervical spine were also generated. COMPARISON:  MRI 07/17/2015, CT brain 07/08/2015 FINDINGS: CT HEAD FINDINGS Brain: Hypodensity with loss of the gray-white matter differentiation in the right high parietal lobe consistent with acute to subacute infarct. No hemorrhage. No mass. Mild atrophy. Stable ventricle size. Vascular: No hyperdense vessels.  Carotid vascular calcification Skull: Normal. Negative for fracture or focal lesion. Sinuses/Orbits: No acute finding. Mucous retention cyst in the left maxillary sinus. Other: None CT CERVICAL SPINE FINDINGS Alignment: Motion degraded study which limits evaluation for fracture. Facet alignment within normal limits. Skull base and vertebrae: Craniovertebral junction is intact. No obvious fracture allowing for motion Soft tissues and spinal canal: No prevertebral fluid or swelling. No visible canal hematoma. Disc levels: Moderate degenerative change at C3-C4, C5-C6 and C6-C7. Upper chest: Negative. Other: None IMPRESSION: 1. Hypodensity within the high right parietal lobe, consistent with acute to subacute infarct. No hemorrhage. Atrophy. 2. Motion degraded images of the cervical spine which limits evaluation for fracture. No definite acute osseous abnormality is seen. Electronically Signed   By: Donavan Foil M.D.   On: 06/07/2018 19:41   Ct Cervical Spine Wo Contrast  Result Date: 06/07/2018 CLINICAL DATA:  Altered LOC, witnessed seizure EXAM: CT HEAD WITHOUT CONTRAST CT CERVICAL SPINE WITHOUT CONTRAST TECHNIQUE: Multidetector CT imaging of the head and cervical spine was performed following the standard protocol without intravenous contrast. Multiplanar CT image reconstructions of the cervical spine were also generated. COMPARISON:  MRI 07/17/2015, CT brain 07/08/2015 FINDINGS: CT HEAD  FINDINGS Brain: Hypodensity with loss of the gray-white matter differentiation in the right high parietal lobe consistent with acute to subacute infarct. No hemorrhage. No mass. Mild atrophy. Stable ventricle size. Vascular: No hyperdense vessels.  Carotid vascular calcification Skull: Normal. Negative for fracture or focal lesion. Sinuses/Orbits: No acute finding. Mucous retention cyst  in the left maxillary sinus. Other: None CT CERVICAL SPINE FINDINGS Alignment: Motion degraded study which limits evaluation for fracture. Facet alignment within normal limits. Skull base and vertebrae: Craniovertebral junction is intact. No obvious fracture allowing for motion Soft tissues and spinal canal: No prevertebral fluid or swelling. No visible canal hematoma. Disc levels: Moderate degenerative change at C3-C4, C5-C6 and C6-C7. Upper chest: Negative. Other: None IMPRESSION: 1. Hypodensity within the high right parietal lobe, consistent with acute to subacute infarct. No hemorrhage. Atrophy. 2. Motion degraded images of the cervical spine which limits evaluation for fracture. No definite acute osseous abnormality is seen. Electronically Signed   By: Donavan Foil M.D.   On: 06/07/2018 19:41   Mr Jodene Nam Head Wo Contrast  Result Date: 06/08/2018 CLINICAL DATA:  Stroke.  Seizure.  Found unresponsive. EXAM: MRI HEAD WITHOUT CONTRAST MRA HEAD WITHOUT CONTRAST TECHNIQUE: Multiplanar, multiecho pulse sequences of the brain and surrounding structures were obtained without intravenous contrast. Angiographic images of the head were obtained using MRA technique without contrast. COMPARISON:  CT head 06/07/2018 FINDINGS: MRI HEAD FINDINGS Brain: Negative for acute infarct. Chronic infarct with laminar necrosis right parietal lobe corresponding to the hypodensity on CT. Mild associated hemorrhage in the right parietal infarct. Mild chronic microvascular ischemic change in the white matter. Mild generalized atrophy. Negative for mass or  midline shift. Vascular: Normal arterial flow voids. Skull and upper cervical spine: Negative Sinuses/Orbits: Mild mucosal edema paranasal sinuses.  Normal orbit Other: None MRA HEAD FINDINGS Both vertebral arteries are patent to the basilar without stenosis. PICA patent bilaterally. Basilar widely patent. Superior cerebellar arteries are patent bilaterally. Fetal origin of the posterior cerebral artery bilaterally with hypoplastic P1 segments bilaterally. Internal carotid artery widely patent bilaterally. Anterior and middle cerebral arteries patent bilaterally without stenosis. Negative for cerebral aneurysm. IMPRESSION: Negative for acute infarct Chronic infarct right parietal lobe. Mild chronic microvascular ischemic change in the white matter Negative MRA head Electronically Signed   By: Franchot Gallo M.D.   On: 06/08/2018 10:59   Mr Brain Wo Contrast  Result Date: 06/08/2018 CLINICAL DATA:  Stroke.  Seizure.  Found unresponsive. EXAM: MRI HEAD WITHOUT CONTRAST MRA HEAD WITHOUT CONTRAST TECHNIQUE: Multiplanar, multiecho pulse sequences of the brain and surrounding structures were obtained without intravenous contrast. Angiographic images of the head were obtained using MRA technique without contrast. COMPARISON:  CT head 06/07/2018 FINDINGS: MRI HEAD FINDINGS Brain: Negative for acute infarct. Chronic infarct with laminar necrosis right parietal lobe corresponding to the hypodensity on CT. Mild associated hemorrhage in the right parietal infarct. Mild chronic microvascular ischemic change in the white matter. Mild generalized atrophy. Negative for mass or midline shift. Vascular: Normal arterial flow voids. Skull and upper cervical spine: Negative Sinuses/Orbits: Mild mucosal edema paranasal sinuses.  Normal orbit Other: None MRA HEAD FINDINGS Both vertebral arteries are patent to the basilar without stenosis. PICA patent bilaterally. Basilar widely patent. Superior cerebellar arteries are patent  bilaterally. Fetal origin of the posterior cerebral artery bilaterally with hypoplastic P1 segments bilaterally. Internal carotid artery widely patent bilaterally. Anterior and middle cerebral arteries patent bilaterally without stenosis. Negative for cerebral aneurysm. IMPRESSION: Negative for acute infarct Chronic infarct right parietal lobe. Mild chronic microvascular ischemic change in the white matter Negative MRA head Electronically Signed   By: Franchot Gallo M.D.   On: 06/08/2018 10:59   Dg Chest Port 1 View  Result Date: 06/10/2018 CLINICAL DATA:  Visual hallucination EXAM: PORTABLE CHEST 1 VIEW COMPARISON:  Chest x-rays dated 06/07/2018  and 07/26/2015. FINDINGS: Heart size is upper normal, stable. Mediastinal width is slightly more prominent on today's study, likely related to patient's semi-erect positioning. Lungs are clear. No pleural effusion or pneumothorax seen. Osseous structures about the chest are unremarkable. IMPRESSION: 1. No active disease.  No evidence of pneumonia or pulmonary edema. 2. Mediastinal width is slightly more prominent on today's study, but this is most likely related to patient's semi-erect positioning in the absence of chest pain. Electronically Signed   By: Franki Cabot M.D.   On: 06/10/2018 13:09   Dg Chest Port 1 View  Result Date: 06/07/2018 CLINICAL DATA:  Seizure today EXAM: PORTABLE CHEST 1 VIEW COMPARISON:  July 26, 2015 FINDINGS: The heart size and mediastinal contours are within normal limits. Aorta is tortuous both lungs are clear. The visualized skeletal structures are stable. IMPRESSION: No active cardiopulmonary disease. Electronically Signed   By: Abelardo Diesel M.D.   On: 06/07/2018 16:56    Lab Data:  CBC: Recent Labs  Lab 06/07/18 1645 06/07/18 1655  WBC 5.9  --   NEUTROABS 4.5  --   HGB 12.8* 14.3  HCT 42.0 42.0  MCV 101.0*  --   PLT 164  --    Basic Metabolic Panel: Recent Labs  Lab 06/07/18 1645 06/07/18 1655 06/08/18 0522  06/09/18 0459 06/10/18 0951  NA 142 140 139 140 138  K 4.2 4.9 3.8 3.9 4.5  CL 108 110 114* 113* 114*  CO2 15*  --  17* 16* 15*  GLUCOSE 133* 128* 89 77 74  BUN 10 15 10 11 8   CREATININE 2.58* 2.40* 2.04* 2.01* 1.58*  CALCIUM 8.4*  --  7.3* 7.6* 7.7*  MG  --   --   --   --  1.2*  PHOS  --   --   --   --  2.4*   GFR: Estimated Creatinine Clearance: 43.3 mL/min (A) (by C-G formula based on SCr of 1.58 mg/dL (H)). Liver Function Tests: Recent Labs  Lab 06/07/18 1645 06/10/18 0951  AST 44*  --   ALT 11  --   ALKPHOS 92  --   BILITOT 1.0  --   PROT 7.1  --   ALBUMIN 3.2* 2.6*   No results for input(s): LIPASE, AMYLASE in the last 168 hours. No results for input(s): AMMONIA in the last 168 hours. Coagulation Profile: No results for input(s): INR, PROTIME in the last 168 hours. Cardiac Enzymes: Recent Labs  Lab 06/07/18 1645  CKTOTAL 188   BNP (last 3 results) No results for input(s): PROBNP in the last 8760 hours. HbA1C: Recent Labs    06/08/18 0002  HGBA1C 5.2   CBG: Recent Labs  Lab 06/07/18 1621  GLUCAP 120*   Lipid Profile: Recent Labs    06/08/18 0002  CHOL 165  HDL 86  LDLCALC 62  TRIG 87  CHOLHDL 1.9   Thyroid Function Tests: No results for input(s): TSH, T4TOTAL, FREET4, T3FREE, THYROIDAB in the last 72 hours. Anemia Panel: No results for input(s): VITAMINB12, FOLATE, FERRITIN, TIBC, IRON, RETICCTPCT in the last 72 hours. Urine analysis:    Component Value Date/Time   COLORURINE YELLOW 06/10/2018 1140   APPEARANCEUR CLEAR 06/10/2018 1140   LABSPEC 1.011 06/10/2018 1140   PHURINE 5.0 06/10/2018 1140   GLUCOSEU NEGATIVE 06/10/2018 1140   HGBUR LARGE (A) 06/10/2018 1140   BILIRUBINUR NEGATIVE 06/10/2018 1140   KETONESUR 5 (A) 06/10/2018 1140   PROTEINUR 100 (A) 06/10/2018 1140   UROBILINOGEN 1.0  07/19/2015 1739   NITRITE NEGATIVE 06/10/2018 1140   LEUKOCYTESUR NEGATIVE 06/10/2018 1140     Bonnell Public M.D. [facility] 06/10/2018, 4:30 PM  Between 7am to 7pm - call Pager - [phone].  After 7pm go to [email or web address] - password [email or web address]  Call night coverage person covering after 7pm

## 2018-06-10 NOTE — Progress Notes (Signed)
PT Cancellation Note  Patient Details Name: Mitchell [name] MRN: [id number] DOB: [birth year]   Cancelled Treatment:    Reason Eval/Treat Not Completed: Patient declined, no reason specified PT attempting to see patient. RN/nurse tech in room completing peri-care. PT with max motivational cueing to perform mobility for eval with patient alert throughout. Throughout conversation patient refusing to look at or speak to PT. Unable to perform due to patients willingness to participate. Will follow-up as time allows.     Lanney Gins, PT, DPT 06/10/18 11:46 AM Pager: [phone]

## 2018-06-10 NOTE — Progress Notes (Addendum)
Subjective: Seizure  PMH- Gout, HLD, DVT on chronic anticoagulation, anoxic brain injury, systolic CHF. EtOH abuse   Wife at bedside states that she found her Mitchell [name] lodged in between their bed and witnessed his seizure characterized by "seeing the white of his eyes, foam coming out of his mouth, with his body jerking," She described tonic clonic like body movements prior to EMS arriving. She also states that Mitchell [name] hides liquor and drinks 1/2 gallons or more/ day of Vodka, Bourbon and more. She said Mitchell [name]'s become very belligerent progressively, and will call her out her name and is verbally abusive. She also states that Mitchell [name] says that "Mitchell [name] want's to die." Sjhe also states that his apatite had become very poor and Mitchell [name]'d mainly drink rather than eat. She says that Mitchell [name] was cooperative with eating some of his food today.   Today, Mitchell [name] was lethargic on exam and needed noxious stimuli to awake. Mitchell [name] has been receiving Ativan for CIWA, and behavior reported. There has been no seizure activity. Mitchell [name] does not appear coherent enough to hold a conversation . Mitchell [name] deflects by cursing at me while I'm attempting to complete an assessment.       Exam: Vitals:   06/10/18 0823 06/10/18 1316  BP: (!) 175/90 139/77  Pulse: (!) 45 (!) 109  Resp: 18 18  Temp: 98.2 F (36.8 C) 98.2 F (36.8 C)  SpO2: 100% 99%    Physical Exam   HEENT-  Normocephalic, no lesions, without obvious abnormality.  Normal external eye and conjunctiva.   Cardiovascular- S1-S2 audible, pulses palpable throughout   Lungs-no rhonchi or wheezing noted, no excessive working breathing.  Saturations within normal limits Abdomen- All 4 quadrants palpated and nontender Extremities- Warm, dry and intact; generalized edema  Musculoskeletal-no joint tenderness, deformity or swelling Skin-warm and dry, no hyperpigmentation, vitiligo, or suspicious lesions   Neuro:  Mental Status: Lethargic-  Cranial Nerves: II:  Left field appears impaired- no blink to  threat- (wife states that Mitchell [name] has cataracts and poor vision at baseline) III,IV, VI: ptosis not present, extra-ocular motions intact bilaterally pupils equal, round, reactive to light and accommodation V,VII: smile symmetric, facial light touch sensation normal bilaterally VIII: hearing normal bilaterally IX,X: uvula rises midline XI: not tested  XII: midline tongue extension Motor: Purposeful movement  But does not follow commands. Not participating with assessment        Tone and bulk:normal tone throughout; no atrophy noted Sensory: Pinprick and light touch intact throughout, bilaterally Plantars: Right: downgoing   Left: downgoing Cerebellar: Did not test would not follow    Gait: not tested     Medications:  Scheduled: . atorvastatin  40 mg Oral q1800  . colchicine  0.6 mg Oral Once per day on Mon Thu  . febuxostat  80 mg Oral Daily  . folic acid  1 mg Oral Daily  . levETIRAcetam  500 mg Oral BID  . metoprolol tartrate  50 mg Oral BID  . multivitamin  1 tablet Oral Daily  . pantoprazole  40 mg Oral Daily  . rivaroxaban  15 mg Oral Daily   Continuous: . sodium chloride 75 mL/hr at 06/08/18 2105  . thiamine injection 500 mg (06/10/18 1334)   YQM:[id number] **OR** acetaminophen (TYLENOL) oral liquid 160 mg/5 mL **OR** acetaminophen, LORazepam **OR** LORazepam, senna-docusate  Pertinent Labs/Diagnostics:   Dg Chest Port 1 View  Result Date: 06/10/2018 CLINICAL DATA:  Visual hallucination EXAM: PORTABLE CHEST 1 VIEW COMPARISON:  Chest x-rays dated 06/07/2018 and 07/26/2015. FINDINGS:  Heart size is upper normal, stable. Mediastinal width is slightly more prominent on today's study, likely related to patient's semi-erect positioning. Lungs are clear. No pleural effusion or pneumothorax seen. Osseous structures about the chest are unremarkable. IMPRESSION: 1. No active disease.  No evidence of pneumonia or pulmonary edema. 2. Mediastinal width is slightly more prominent on  today's study, but this is most likely related to patient's semi-erect positioning in the absence of chest pain. Electronically Signed   By: Franki Cabot M.D.   On: 06/10/2018 13:09   Images   EEG Routine Normal electroencephalogram, awake, drowsy and asleep. There are no focal lateralizing or epileptiform features.  Echo 2D - Left ventricle: The cavity size was normal. Wall thickness was   normal. Systolic function was moderately to severely reduced. The   estimated ejection fraction was in the range of 30% to 35%.   Diffuse hypokinesis. Features are consistent with a pseudonormal   left ventricular filling pattern, with concomitant abnormal   relaxation and increased filling pressure (grade 2 diastolic   dysfunction). - Aortic valve: There was mild regurgitation.  MR Brain w/o contrast  Negative for acute infarct Chronic infarct right parietal lobe. Mild chronic microvascular ischemic change in the white matter  Impression: Mitchell [name] is a 67 y/o male w/ PMH- Gout, HLD, DVT on chronic anticoagulation, anoxic brain injury, systolic CHF. EtOH abuse s/p seizure that was likely secondary to ETOH withdrawal. CT Brain and MRI negative for acute infactions of abnormalities. Last EEG without Seizure activity     DDx includes EtOH withdrawal, ? Wernicke or Korsakoff encephalopathy which unlikely to reverse  Assessment: New onset seizure  1.. Labs  Cr 1.58 (AKI) now improved down from 2.4, Chloride 114, Mg 1.2 needing replacement per protocol. Positive benzo, THC. without leukocytosis; Lactic 0.9, SBP 170's  2. Had been receiving Lorazepam scheduled and per CIWA protocol- now d/c'd  3. No further seizures on EEG  Recommendations: 1. Currently receiving high doses of thiamine and folic acid- continue medical management, and electrolyte replacement, maintain normalized pressures   2. Try to avoid using Ativan if patient remains w/o seizures for behavior as Mitchell [name] is encephalopathic  currently and benzos could fuel further lethargy, and alter exam. Can use low dose Seroquel instead.  3. Continue Keppra 500 mg po BID,- subclinical seizure likely secondary to alcohol withdrawal but will keep for now.   4. Correction of toxic metabolic derangements per primary team as you are.  Please call with questions. We will be available as needed  06/10/2018, 2:00 PM  Attending Neurohospitalist Addendum Patient seen and examined with APP/Resident. Agree with the history and physical as documented above. Agree with the plan as documented, which I helped formulate. I have independently reviewed the chart, obtained history, review of systems and examined the patient.I have personally reviewed pertinent head/neck/spine imaging (CT/MRI).   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 EEGwith no evidence of seizures.   Wife reports subacute history of behavior change. 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  Please feel free to call with any questions. --- Amie Portland, MD [facility] Pager: [phone]  If 7pm to 7am, please call on call as listed on [facility]ION.

## 2018-06-10 NOTE — Progress Notes (Signed)
I feed the patient his dinner yesterday, he ate 100%. He had a big BM, pasted the wall and the bed with the BM. I redirected him towards positive actions.

## 2018-06-11 DIAGNOSIS — M109 Gout, unspecified: Secondary | ICD-10-CM

## 2018-06-11 DIAGNOSIS — F05 Delirium due to known physiological condition: Secondary | ICD-10-CM

## 2018-06-11 LAB — RENAL FUNCTION PANEL
Albumin: 2.5 g/dL — ABNORMAL LOW (ref 3.5–5.0)
Anion gap: 9 (ref 5–15)
BUN: 11 mg/dL (ref 8–23)
CO2: 17 mmol/L — ABNORMAL LOW (ref 22–32)
Calcium: 8 mg/dL — ABNORMAL LOW (ref 8.9–10.3)
Chloride: 113 mmol/L — ABNORMAL HIGH (ref 98–111)
Creatinine, Ser: 1.64 mg/dL — ABNORMAL HIGH (ref 0.61–1.24)
GFR calc Af Amer: 48 mL/min — ABNORMAL LOW (ref >60)
GFR calc non Af Amer: 41 mL/min — ABNORMAL LOW (ref >60)
Glucose, Bld: 123 mg/dL — ABNORMAL HIGH (ref 70–99)
Phosphorus: 2.8 mg/dL (ref 2.5–4.6)
Potassium: 4.1 mmol/L (ref 3.5–5.1)
Sodium: 139 mmol/L (ref 135–145)

## 2018-06-11 LAB — URINE CULTURE: Culture: NO GROWTH

## 2018-06-11 LAB — MAGNESIUM: Magnesium: 1.7 mg/dL (ref 1.7–2.4)

## 2018-06-11 MED ORDER — SODIUM CHLORIDE 0.9% FLUSH: 10.0000 mL | INTRAVENOUS | Status: DC | PRN

## 2018-06-11 MED ORDER — LORAZEPAM 2 MG/ML IJ SOLN
1.0000 mg | Freq: Once | INTRAMUSCULAR | Status: AC
  Administered 2018-06-11: 1 mg via INTRAMUSCULAR
  Filled 2018-06-11: qty 1

## 2018-06-11 NOTE — Evaluation (Signed)
Physical Therapy Evaluation Patient Details Name: Mitchell [name] MRN: [id number] DOB: [birth year] Today's Date: 06/11/2018   History of Present Illness  Pt is a 67 y.o. 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 Workup for seizure vs CVA. 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   Clinical Impression  Orders received for PT evaluation. Patient demonstrates deficits in functional mobility as indicated below. Will benefit from continued skilled PT to address deficits and maximize function. Will see as indicated and progress as tolerated.  At this time, patient extremely confused throughout session and requires significant physical assist for safety and mobility. Will need post acute rehabilitation. Recommending SNF at this time.    Follow Up Recommendations SNF;Supervision/Assistance - 24 hour(TBD)    Equipment Recommendations  Other (comment)    Recommendations for Other Services       Precautions / Restrictions Precautions Precautions: Fall Precaution Comments: agitated at times      Mobility  Bed Mobility Overal bed mobility: Needs Assistance Bed Mobility: Supine to Sit;Sit to Supine     Supine to sit: Mod assist Sit to supine: Max assist   General bed mobility comments: moderate assist to elevate trunk and initiate movement to EOB. Max assist to return to supine mainly due to patient confusion   Transfers Overall transfer level: Needs assistance Equipment used: 2 person hand held assist Transfers: Sit to/from Stand Sit to Stand: Min assist         General transfer comment: Min assist for stability  Ambulation/Gait Ambulation/Gait assistance: Max assist;+2 physical assistance Gait Distance  (Feet): 1 Feet Assistive device: 2 person hand held assist       General Gait Details: patient attempted to take step forward but could not coordinate balance safely to perform ambulation. Max assist to prevent fall anteriorly  Stairs            Wheelchair Mobility    Modified Rankin (Stroke Patients Only)       Balance Overall balance assessment: Needs assistance Sitting-balance support: No upper extremity supported;Feet supported Sitting balance-Leahy Scale: Fair     Standing balance support: No upper extremity supported;During functional activity Standing balance-Leahy Scale: Poor Standing balance comment: hands on physical assist required                             Pertinent Vitals/Pain Pain Assessment: Faces Faces Pain Scale: Hurts even more Pain Location: left knee and neck Pain Descriptors / Indicators: Grimacing;Guarding Pain Intervention(s): Monitored during session    Home Living Family/patient expects to be discharged to:: Private residence Living Arrangements: Spouse/significant other Available Help at Discharge: Family;Available 24 hours/day Type of Home: House Home Access: Level entry     Home Layout: One level Home Equipment: Bedside commode;Shower seat;Walker - 2 wheels;Cane - single point;Toilet riser Additional Comments: information taken from chart review    Prior Function Level of Independence: Needs assistance         Comments: unclear of baseline functional status     Hand Dominance   Dominant Hand: Right    Extremity/Trunk Assessment   Upper Extremity Assessment Upper Extremity Assessment: Generalized weakness(+ arthritic changes, noted ataxia bilaterally)    Lower Extremity Assessment Lower Extremity Assessment: Generalized weakness(+ arthritic  changes, decreased coordination bilaterally)       Communication   Communication: No difficulties  Cognition Arousal/Alertness: Awake/alert Behavior During  Therapy: Restless Overall Cognitive Status: Impaired/Different from baseline Area of Impairment: Following commands;Safety/judgement;Awareness;Problem solving                       Following Commands: Follows one step commands with increased time;Follows multi-step commands inconsistently Safety/Judgement: Decreased awareness of safety;Decreased awareness of deficits Awareness: Emergent Problem Solving: Slow processing;Decreased initiation;Requires verbal cues        General Comments      Exercises     Assessment/Plan    PT Assessment Patient needs continued PT services  PT Problem List         PT Treatment Interventions DME instruction;Gait training;Functional mobility training;Therapeutic activities;Therapeutic exercise;Balance training;Neuromuscular re-education;Cognitive remediation;Patient/family education    PT Goals (Current goals can be found in the Care Plan section)  Acute Rehab PT Goals Patient Stated Goal: none stated PT Goal Formulation: Patient unable to participate in goal setting Time For Goal Achievement: 06/25/18 Potential to Achieve Goals: Fair    Frequency Min 2X/week   Barriers to discharge        Co-evaluation PT/OT/SLP Co-Evaluation/Treatment: Yes Reason for Co-Treatment: For patient/therapist safety PT goals addressed during session: Mobility/safety with mobility;Balance OT goals addressed during session: ADL's and self-care       AM-PAC PT "6 Clicks" Daily Activity  Outcome Measure Difficulty turning over in bed (including adjusting bedclothes, sheets and blankets)?: Unable Difficulty moving from lying on back to sitting on the side of the bed? : Unable Difficulty sitting down on and standing up from a chair with arms (e.g., wheelchair, bedside commode, etc,.)?: Unable Help needed moving to and from a bed to chair (including a wheelchair)?: A Lot Help needed walking in hospital room?: A Lot Help needed climbing 3-5 steps with a  railing? : Total 6 Click Score: 8    End of Session   Activity Tolerance: Patient limited by pain;Other (comment)(and confusion) Patient left: in bed;with call bell/phone within reach;with bed alarm set;Other (comment)(other nurses in room ) Nurse Communication: Mobility status PT Visit Diagnosis: Ataxic gait (R26.0);Other symptoms and signs involving the nervous system (R29.898);Pain    Time: 1572-6203 PT Time Calculation (min) (ACUTE ONLY): 17 min   Charges:   PT Evaluation $PT Eval Moderate Complexity: 1 Mod          Alben Deeds, PT DPT  Board Certified Neurologic Specialist Acute Rehabilitation Services Pager [phone] Office [phone]   [name] 06/11/2018, 8:40 AM

## 2018-06-11 NOTE — Progress Notes (Signed)
Pt. Verbally refused to have PIV restarted.  Pt. Stated, " Stick it up your own ass."  Fist gesture. Staff notified.

## 2018-06-11 NOTE — Progress Notes (Signed)
[facility]                                                                              Patient Demographics  Mitchell [name], is a 67 y.o. male, DOB - [birth year], KWI:[id number]  Admit date - 06/07/2018   Admitting Physician Elwyn Reach, MD  Outpatient Primary MD for the patient is [name], MD  Outpatient specialists:   LOS - 4  days   Medical records reviewed and are as summarized below:    Chief Complaint  Patient presents with  . 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  He was suspected to have a seizure due to alcohol withdrawal. Patient was last seen normal at 3:30 AM around 4 AM his wife found him unresponsive.  She called paramedics who came by and try to bring patient into the hospital.  En route, patient had seizure and was given 2.5 mg of Versed.  MRI of the brain was negative for acute infarct, which revealed chronic infarct right parietal lobe, mild chronic microvascular ischemic change in the white matter.  MRA head was negative.    06/10/2018: Staff reported that it was difficult managing the patient's behavior last night.  Patient was said to be spilling feces all over the room.  Patient is currently sleeping quietly.  His wife reported episodes of visual hallucination.  Previous EKG done revealed prolonged QTc interval (530 ms).  Will repeat another EKG.  Will check electrolytes, and correct abnormal potassium or magnesium.  Will repeat EKG in the morning.  Will hold off on any antipsychotics for now.  Will avoid benzodiazepines if possible.  06/11/2018: Patient continues to pose difficulties at nighttime, and tends to fall asleep in the morning.  For skilled nursing facility placement.  Repeat EKG reviewed, QTC is still prolonged.   Assessment & Plan     Principal Problem: Acute/subacute CVA (cerebral vascular accident) ([place]) -Presented with unresponsive episode.  CT head showed hypodensity in the high right parietal lobe consistent with acute to subacute infarct -Alert and awake today, not a good historian, MRI brain, MRA pending -Follow 2D echo, stroke work-up, PT OT evaluation -Speech therapy evaluation recommended home health SLP -Currently on aspirin, Xarelto, statin -LDL 62 A1c 5.2 -Per neurology, if MRI consistent with cortical edema due to seizure, rather than CVA, discontinue aspirin  06/10/2018: Staff reported that it was difficult managing the patient's behavior last night.  Patient was said to be spilling feces all over the room.  Patient is currently sleeping quietly.  His wife reported episodes of visual hallucination.  Previous EKG done revealed prolonged QTc interval (530 ms).  Will repeat another EKG.  Will check electrolytes, and correct abnormal potassium or magnesium.  Will repeat EKG in the morning.  Will hold off on any antipsychotics for now.  Will avoid benzodiazepines if possible.  06/11/2018: Patient is sleeping quietly.  Continue current management.  Pursue placement.  Active Problems: Seizure:  Possibly due to alcohol versus CVA -Patient was placed on Keppra in ED. currently on CIWA scale with Ativan -Continue seizure precaution Minimize benzodiazepine use.  Acute kidney injury on chronic kidney disease stage III -Patient presented with creatinine function of 2.5, lactic acidosis 13.0 -Hold furosemide today, continue gentle hydration for 2 L then KVO -Baseline creatinine ~ 2.0 -06/10/2018: Acute kidney injury is resolving.  Serum creatinine is down to 1.58.  Continue IV fluids.  Continue to monitor renal function and electrolytes. 06/11/2018: Repeat BMP.  Lactic acidosis -Likely due to seizures, lactic acid 13.0 at the time of admission, CK normal, -Improved, 0.9, continue gentle hydration today     Hyperlipidemia -LDL 62, continue Lipitor    Gout, unspecified -Stable, no acute issues, continue Uloric    HYPERTENSION, BENIGN SYSTEMIC -BP currently stable, continue Lopressor    Chronic systolic CHF (congestive heart failure) (HCC) -Currently compensated, euvolemic -Holding Lasix for now.    Alcohol abuse -Patient counseled on alcohol cessation, continue CIWA scale with Ativan  Cannabis abuse:  Patient counseled.  Prolonged QTc interval: Repeat BMP and magnesium level. Avoid medications that can prolong QTC whenever possible   Likely delirium versus dementing process: We will consider low-dose antipsychotics when QTC is within normal range. Repeat EKG in the morning.  Code Status: Full CODE. DVT Prophylaxis: Xarelto Family Communication: Discussed in detail with the patient and patient's wife.  Disposition Plan: SNF     Time Spent in minutes 25  Procedures:  MRI brain  Consultants:   Neurology  Antimicrobials:      Medications  Scheduled Meds: . atorvastatin  40 mg Oral q1800  . colchicine  0.6 mg Oral Once per day on Mon Thu  . febuxostat  80 mg Oral Daily  . folic acid  1 mg Oral Daily  . levETIRAcetam  500 mg Oral BID  . metoprolol tartrate  50 mg Oral BID  . multivitamin  1 tablet Oral Daily  . pantoprazole  40 mg Oral Daily  . rivaroxaban  15 mg Oral Daily   Continuous Infusions: . sodium chloride 75 mL/hr at 06/11/18 0857  . thiamine injection 500 mg (06/11/18 1333)   PRN Meds:.acetaminophen **OR** acetaminophen (TYLENOL) oral liquid 160 mg/5 mL **OR** acetaminophen, senna-docusate, sodium chloride flush   Antibiotics   Anti-infectives (From admission, onward)   Start     Dose/Rate Route Frequency Ordered Stop   06/07/18 1715  piperacillin-tazobactam (ZOSYN) IVPB 3.375 g     3.375 g 100 mL/hr over 30 Minutes Intravenous  Once 06/07/18 1705 06/07/18 1813   06/07/18 1715  vancomycin (VANCOCIN) IVPB 1000 mg/200 mL premix     1,000  mg 200 mL/hr over 60 Minutes Intravenous  Once 06/07/18 1705 06/07/18 1923        Subjective:   Mitchell [name] was seen and examined today.  Patient is sleeping quietly.  The patient's behavior was said to have been difficult to manage last night.   Objective:   Vitals:   06/11/18 0354 06/11/18 0856 06/11/18 1147 06/11/18 1530  BP: (!) 188/102 (!) 182/85 (!) 148/84 (!) 156/79  Pulse: 80 87 64 82  Resp: 18 18 18 18   Temp:  98.7 F (37.1 C) 98.1 F (36.7 C) 98.6 F (37 C)  TempSrc:  Oral Oral Oral  SpO2: 100% 100% 98% 99%  Weight:      Height:        Intake/Output Summary (Last 24 hours) at 06/11/2018 1701 Last data filed at 06/11/2018 1333 Gross  per 24 hour  Intake 1503.55 ml  Output -  Net 1503.55 ml     Wt Readings from Last 3 Encounters:  06/07/18 80.9 kg  03/09/17 86.2 kg  08/14/15 73.7 kg     Exam  General: Alert and oriented x self and place, NAD  Eyes:   HEENT:  Atraumatic, normocephalic,  Cardiovascular: S1 S2 auscultated, Regular rate and rhythm.  Respiratory: Clear to auscultation bilaterally, no wheezing, rales or rhonchi  Gastrointestinal: Soft, nontender, nondistended, + bowel sounds  Ext: no pedal edema bilaterally  Neuro: Sleeping quietly.    Data Reviewed:  I have personally reviewed following labs and imaging studies  Micro Results Recent Results (from the past 240 hour(s))  Blood Culture (routine x 2)     Status: None (Preliminary result)   Collection Time: 06/07/18  5:30 PM  Result Value Ref Range Status   Specimen Description BLOOD RIGHT ANTECUBITAL  Final   Special Requests   Final    BOTTLES DRAWN AEROBIC AND ANAEROBIC Blood Culture adequate volume   Culture   Final    NO GROWTH 4 DAYS Performed at [facility], [address], [place], [place] [address]    Report Status PENDING  Incomplete  Blood Culture (routine x 2)     Status: None (Preliminary result)   Collection Time: 06/07/18  5:30 PM  Result Value Ref Range  Status   Specimen Description BLOOD RIGHT FOREARM  Final   Special Requests   Final    BOTTLES DRAWN AEROBIC AND ANAEROBIC Blood Culture adequate volume   Culture   Final    NO GROWTH 4 DAYS Performed at [facility], [place] [address] [place], [place] [address]    Report Status PENDING  Incomplete  Culture, Urine     Status: None   Collection Time: 06/10/18 10:36 AM  Result Value Ref Range Status   Specimen Description URINE, CLEAN CATCH  Final   Special Requests NONE  Final   Culture   Final    NO GROWTH Performed at [facility], [place] [address], [place], [place] [address]    Report Status 06/11/2018 FINAL  Final    Radiology Reports Ct Head Wo Contrast  Result Date: 06/07/2018 CLINICAL DATA:  Altered LOC, witnessed seizure EXAM: CT HEAD WITHOUT CONTRAST CT CERVICAL SPINE WITHOUT CONTRAST TECHNIQUE: Multidetector CT imaging of the head and cervical spine was performed following the standard protocol without intravenous contrast. Multiplanar CT image reconstructions of the cervical spine were also generated. COMPARISON:  MRI 07/17/2015, CT brain 07/08/2015 FINDINGS: CT HEAD FINDINGS Brain: Hypodensity with loss of the gray-white matter differentiation in the right high parietal lobe consistent with acute to subacute infarct. No hemorrhage. No mass. Mild atrophy. Stable ventricle size. Vascular: No hyperdense vessels.  Carotid vascular calcification Skull: Normal. Negative for fracture or focal lesion. Sinuses/Orbits: No acute finding. Mucous retention cyst in the left maxillary sinus. Other: None CT CERVICAL SPINE FINDINGS Alignment: Motion degraded study which limits evaluation for fracture. Facet alignment within normal limits. Skull base and vertebrae: Craniovertebral junction is intact. No obvious fracture allowing for motion Soft tissues and spinal canal: No prevertebral fluid or swelling. No visible canal hematoma. Disc levels: Moderate degenerative change at C3-C4, C5-C6 and  C6-C7. Upper chest: Negative. Other: None IMPRESSION: 1. Hypodensity within the high right parietal lobe, consistent with acute to subacute infarct. No hemorrhage. Atrophy. 2. Motion degraded images of the cervical spine which limits evaluation for fracture. No definite  acute osseous abnormality is seen. Electronically Signed   By: Donavan Foil M.D.   On: 06/07/2018 19:41   Ct Cervical Spine Wo Contrast  Result Date: 06/07/2018 CLINICAL DATA:  Altered LOC, witnessed seizure EXAM: CT HEAD WITHOUT CONTRAST CT CERVICAL SPINE WITHOUT CONTRAST TECHNIQUE: Multidetector CT imaging of the head and cervical spine was performed following the standard protocol without intravenous contrast. Multiplanar CT image reconstructions of the cervical spine were also generated. COMPARISON:  MRI 07/17/2015, CT brain 07/08/2015 FINDINGS: CT HEAD FINDINGS Brain: Hypodensity with loss of the gray-white matter differentiation in the right high parietal lobe consistent with acute to subacute infarct. No hemorrhage. No mass. Mild atrophy. Stable ventricle size. Vascular: No hyperdense vessels.  Carotid vascular calcification Skull: Normal. Negative for fracture or focal lesion. Sinuses/Orbits: No acute finding. Mucous retention cyst in the left maxillary sinus. Other: None CT CERVICAL SPINE FINDINGS Alignment: Motion degraded study which limits evaluation for fracture. Facet alignment within normal limits. Skull base and vertebrae: Craniovertebral junction is intact. No obvious fracture allowing for motion Soft tissues and spinal canal: No prevertebral fluid or swelling. No visible canal hematoma. Disc levels: Moderate degenerative change at C3-C4, C5-C6 and C6-C7. Upper chest: Negative. Other: None IMPRESSION: 1. Hypodensity within the high right parietal lobe, consistent with acute to subacute infarct. No hemorrhage. Atrophy. 2. Motion degraded images of the cervical spine which limits evaluation for fracture. No definite acute osseous  abnormality is seen. Electronically Signed   By: Donavan Foil M.D.   On: 06/07/2018 19:41   Mr Jodene Nam Head Wo Contrast  Result Date: 06/08/2018 CLINICAL DATA:  Stroke.  Seizure.  Found unresponsive. EXAM: MRI HEAD WITHOUT CONTRAST MRA HEAD WITHOUT CONTRAST TECHNIQUE: Multiplanar, multiecho pulse sequences of the brain and surrounding structures were obtained without intravenous contrast. Angiographic images of the head were obtained using MRA technique without contrast. COMPARISON:  CT head 06/07/2018 FINDINGS: MRI HEAD FINDINGS Brain: Negative for acute infarct. Chronic infarct with laminar necrosis right parietal lobe corresponding to the hypodensity on CT. Mild associated hemorrhage in the right parietal infarct. Mild chronic microvascular ischemic change in the white matter. Mild generalized atrophy. Negative for mass or midline shift. Vascular: Normal arterial flow voids. Skull and upper cervical spine: Negative Sinuses/Orbits: Mild mucosal edema paranasal sinuses.  Normal orbit Other: None MRA HEAD FINDINGS Both vertebral arteries are patent to the basilar without stenosis. PICA patent bilaterally. Basilar widely patent. Superior cerebellar arteries are patent bilaterally. Fetal origin of the posterior cerebral artery bilaterally with hypoplastic P1 segments bilaterally. Internal carotid artery widely patent bilaterally. Anterior and middle cerebral arteries patent bilaterally without stenosis. Negative for cerebral aneurysm. IMPRESSION: Negative for acute infarct Chronic infarct right parietal lobe. Mild chronic microvascular ischemic change in the white matter Negative MRA head Electronically Signed   By: Franchot Gallo M.D.   On: 06/08/2018 10:59   Mr Brain Wo Contrast  Result Date: 06/08/2018 CLINICAL DATA:  Stroke.  Seizure.  Found unresponsive. EXAM: MRI HEAD WITHOUT CONTRAST MRA HEAD WITHOUT CONTRAST TECHNIQUE: Multiplanar, multiecho pulse sequences of the brain and surrounding structures were  obtained without intravenous contrast. Angiographic images of the head were obtained using MRA technique without contrast. COMPARISON:  CT head 06/07/2018 FINDINGS: MRI HEAD FINDINGS Brain: Negative for acute infarct. Chronic infarct with laminar necrosis right parietal lobe corresponding to the hypodensity on CT. Mild associated hemorrhage in the right parietal infarct. Mild chronic microvascular ischemic change in the white matter. Mild generalized atrophy. Negative for mass or midline shift. Vascular:  Normal arterial flow voids. Skull and upper cervical spine: Negative Sinuses/Orbits: Mild mucosal edema paranasal sinuses.  Normal orbit Other: None MRA HEAD FINDINGS Both vertebral arteries are patent to the basilar without stenosis. PICA patent bilaterally. Basilar widely patent. Superior cerebellar arteries are patent bilaterally. Fetal origin of the posterior cerebral artery bilaterally with hypoplastic P1 segments bilaterally. Internal carotid artery widely patent bilaterally. Anterior and middle cerebral arteries patent bilaterally without stenosis. Negative for cerebral aneurysm. IMPRESSION: Negative for acute infarct Chronic infarct right parietal lobe. Mild chronic microvascular ischemic change in the white matter Negative MRA head Electronically Signed   By: Franchot Gallo M.D.   On: 06/08/2018 10:59   Dg Chest Port 1 View  Result Date: 06/10/2018 CLINICAL DATA:  Visual hallucination EXAM: PORTABLE CHEST 1 VIEW COMPARISON:  Chest x-rays dated 06/07/2018 and 07/26/2015. FINDINGS: Heart size is upper normal, stable. Mediastinal width is slightly more prominent on today's study, likely related to patient's semi-erect positioning. Lungs are clear. No pleural effusion or pneumothorax seen. Osseous structures about the chest are unremarkable. IMPRESSION: 1. No active disease.  No evidence of pneumonia or pulmonary edema. 2. Mediastinal width is slightly more prominent on today's study, but this is most  likely related to patient's semi-erect positioning in the absence of chest pain. Electronically Signed   By: Franki Cabot M.D.   On: 06/10/2018 13:09   Dg Chest Port 1 View  Result Date: 06/07/2018 CLINICAL DATA:  Seizure today EXAM: PORTABLE CHEST 1 VIEW COMPARISON:  July 26, 2015 FINDINGS: The heart size and mediastinal contours are within normal limits. Aorta is tortuous both lungs are clear. The visualized skeletal structures are stable. IMPRESSION: No active cardiopulmonary disease. Electronically Signed   By: Abelardo Diesel M.D.   On: 06/07/2018 16:56    Lab Data:  CBC: Recent Labs  Lab 06/07/18 1645 06/07/18 1655  WBC 5.9  --   NEUTROABS 4.5  --   HGB 12.8* 14.3  HCT 42.0 42.0  MCV 101.0*  --   PLT 164  --    Basic Metabolic Panel: Recent Labs  Lab 06/07/18 1645 06/07/18 1655 06/08/18 0522 06/09/18 0459 06/10/18 0951  NA 142 140 139 140 138  K 4.2 4.9 3.8 3.9 4.5  CL 108 110 114* 113* 114*  CO2 15*  --  17* 16* 15*  GLUCOSE 133* 128* 89 77 74  BUN 10 15 10 11 8   CREATININE 2.58* 2.40* 2.04* 2.01* 1.58*  CALCIUM 8.4*  --  7.3* 7.6* 7.7*  MG  --   --   --   --  1.2*  PHOS  --   --   --   --  2.4*   GFR: Estimated Creatinine Clearance: 43.3 mL/min (A) (by C-G formula based on SCr of 1.58 mg/dL (H)). Liver Function Tests: Recent Labs  Lab 06/07/18 1645 06/10/18 0951  AST 44*  --   ALT 11  --   ALKPHOS 92  --   BILITOT 1.0  --   PROT 7.1  --   ALBUMIN 3.2* 2.6*   No results for input(s): LIPASE, AMYLASE in the last 168 hours. No results for input(s): AMMONIA in the last 168 hours. Coagulation Profile: No results for input(s): INR, PROTIME in the last 168 hours. Cardiac Enzymes: Recent Labs  Lab 06/07/18 1645  CKTOTAL 188   BNP (last 3 results) No results for input(s): PROBNP in the last 8760 hours. HbA1C: No results for input(s): HGBA1C in the last 72 hours. CBG: Recent  Labs  Lab 06/07/18 1621  GLUCAP 120*   Lipid Profile: No results for  input(s): CHOL, HDL, LDLCALC, TRIG, CHOLHDL, LDLDIRECT in the last 72 hours. Thyroid Function Tests: No results for input(s): TSH, T4TOTAL, FREET4, T3FREE, THYROIDAB in the last 72 hours. Anemia Panel: No results for input(s): VITAMINB12, FOLATE, FERRITIN, TIBC, IRON, RETICCTPCT in the last 72 hours. Urine analysis:    Component Value Date/Time   COLORURINE YELLOW 06/10/2018 1140   APPEARANCEUR CLEAR 06/10/2018 1140   LABSPEC 1.011 06/10/2018 1140   PHURINE 5.0 06/10/2018 1140   GLUCOSEU NEGATIVE 06/10/2018 1140   HGBUR LARGE (A) 06/10/2018 1140   BILIRUBINUR NEGATIVE 06/10/2018 1140   KETONESUR 5 (A) 06/10/2018 1140   PROTEINUR 100 (A) 06/10/2018 1140   UROBILINOGEN 1.0 07/19/2015 1739   NITRITE NEGATIVE 06/10/2018 1140   LEUKOCYTESUR NEGATIVE 06/10/2018 1140     Bonnell Public M.D. [facility] 06/11/2018, 5:01 PM  Between 7am to 7pm - call Pager - [phone].  After 7pm go to [email or web address] - password [email or web address]  Call night coverage person covering after 7pm

## 2018-06-11 NOTE — Progress Notes (Signed)
Occupational Therapy Treatment Patient Details Name: Mitchell [name] MRN: [id number] DOB: [birth year] Today's Date: 06/11/2018    History of present illness Pt is a 67 y.o. 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 Workup for seizure vs CVA. 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    OT comments  Pt confused and disoriented throughout session believing that he was in his home and had a bed "in the other room." He was agreeable to participate in ADL tasks seated at EOB this session with min assist for UB dressing tasks and supervision for self-feeding tasks. Noted decreased coordination in L UE with poor accuracy during reach to target today. He was able to stand at EOB with mod assist +2 for safety and take a few steps toward EOB. Updated D/C recommendation to SNF due to current functional deficits. Will continue to follow while admitted.    Follow Up Recommendations  SNF    Equipment Recommendations  None recommended by OT    Recommendations for Other Services      Precautions / Restrictions Precautions Precautions: Fall Precaution Comments: agitated at times Restrictions Weight Bearing Restrictions: No       Mobility Bed Mobility Overal bed mobility: Needs Assistance Bed Mobility: Supine to Sit;Sit to Supine     Supine to sit: Mod assist Sit to supine: Max assist   General bed mobility comments: moderate assist to elevate trunk and initiate movement to EOB. Max assist to return to supine mainly due to patient confusion   Transfers Overall transfer level: Needs assistance Equipment used: 2 person hand held assist Transfers: Sit to/from Stand Sit to Stand: Min assist         General transfer  comment: Min assist for stability    Balance Overall balance assessment: Needs assistance Sitting-balance support: No upper extremity supported;Feet supported Sitting balance-Leahy Scale: Fair     Standing balance support: No upper extremity supported;During functional activity Standing balance-Leahy Scale: Poor Standing balance comment: hands on physical assist required                           ADL either performed or assessed with clinical judgement   ADL Overall ADL's : Needs assistance/impaired Eating/Feeding: Supervision/ safety;Sitting Eating/Feeding Details (indicate cue type and reason): increased time Grooming: Wash/dry face;Wash/dry hands;Supervision/safety;Set up;Sitting   Upper Body Bathing: Sitting;Supervision/ safety;Set up       Upper Body Dressing : Minimal assistance;Sitting                   Functional mobility during ADLs: (sit<>stand only with mod assist) General ADL Comments: Pt requiring mod assist to maintain standing and take side steps at EOB but did not progress with mobility today; limited by confusion and decreased orientation     Vision   Vision Assessment?: Vision impaired- to be further tested in functional context Additional Comments: Noted decreased accuracy with reach to target but only with L UE and is likely due to UE deficits rather than vision. Will need to continue to assess.    Perception     Praxis      Cognition Arousal/Alertness: Awake/alert Behavior During Therapy: Restless Overall Cognitive Status: Impaired/Different from baseline Area of Impairment: Following commands;Safety/judgement;Awareness;Problem solving  Following Commands: Follows one step commands with increased time;Follows multi-step commands inconsistently Safety/Judgement: Decreased awareness of safety;Decreased awareness of deficits Awareness: Emergent Problem Solving: Slow processing;Decreased  initiation;Requires verbal cues General Comments: Pt confused, thinking he was in his home and wanting to find his lemonade in the other room.         Exercises     Shoulder Instructions       General Comments No family present during session.    Pertinent Vitals/ Pain       Pain Assessment: Faces Faces Pain Scale: Hurts even more Pain Location: left knee and neck Pain Descriptors / Indicators: Grimacing;Guarding Pain Intervention(s): Monitored during session  Home Living Family/patient expects to be discharged to:: Private residence Living Arrangements: Spouse/significant other Available Help at Discharge: Family;Available 24 hours/day Type of Home: House Home Access: Level entry     Home Layout: One level     Bathroom Shower/Tub: Teacher, early years/pre: Standard     Home Equipment: Bedside commode;Shower seat;Walker - 2 wheels;Cane - single point;Toilet riser   Additional Comments: information taken from chart review  Lives With: Spouse    Prior Functioning/Environment Level of Independence: Needs assistance        Comments: unclear of baseline functional status   Frequency  Min 2X/week        Progress Toward Goals  OT Goals(current goals can now be found in the care plan section)  Progress towards OT goals: Progressing toward goals  Acute Rehab OT Goals Patient Stated Goal: none stated OT Goal Formulation: With patient Time For Goal Achievement: 06/22/18 Potential to Achieve Goals: Good  Plan Discharge plan needs to be updated;Frequency remains appropriate    Co-evaluation    PT/OT/SLP Co-Evaluation/Treatment: Yes Reason for Co-Treatment: For patient/therapist safety PT goals addressed during session: Mobility/safety with mobility;Balance OT goals addressed during session: ADL's and self-care      AM-PAC PT "6 Clicks" Daily Activity     Outcome Measure   Help from another person eating meals?: None Help from another person  taking care of personal grooming?: None Help from another person toileting, which includes using toliet, bedpan, or urinal?: A Little Help from another person bathing (including washing, rinsing, drying)?: A Little Help from another person to put on and taking off regular upper body clothing?: A Little Help from another person to put on and taking off regular lower body clothing?: A Little 6 Click Score: 20    End of Session Equipment Utilized During Treatment: Gait belt;Rolling walker  OT Visit Diagnosis: Other abnormalities of gait and mobility (R26.89);Unsteadiness on feet (R26.81);Muscle weakness (generalized) (M62.81)   Activity Tolerance Patient tolerated treatment well   Patient Left in bed;with call bell/phone within reach;with bed alarm set;with family/visitor present   Nurse Communication Mobility status;Precautions        Time: 6045-4098 OT Time Calculation (min): 16 min  Charges: OT General Charges $OT Visit: 1 Visit OT Treatments $Self Care/Home Management : 8-22 mins  Belva Agee, OTR/L [place] Pager [phone] Office [place] A Deira Shimer 06/11/2018, 9:01 AM

## 2018-06-11 NOTE — NC FL2 (Signed)
[place] LEVEL OF CARE SCREENING TOOL     IDENTIFICATION  Patient Name: Mitchell [name] Birthdate: 01-31-1951 Sex: male Admission Date (Current Location): 06/07/2018  [facility] and [place] Number:  Herbalist and Address:  [facility], [place] [address], [place], [place] [address]      Provider Number: [id number]  Attending Physician Name and Address:  Bonnell Public, MD  Relative Name and Phone Number:  [place], [place], [phone]     Current Level of Care: Hospital Recommended Level of Care: [place] Prior Approval Number:    Date Approved/Denied:   PASRR Number: [id number]  Discharge Plan: SNF    Current Diagnoses: Patient Active Problem List   Diagnosis Date Noted  . CVA (cerebral vascular accident) ([place]) 06/07/2018  . Acute pancreatitis 03/08/2017  . Chronic combined systolic and diastolic CHF (congestive heart failure) ([place]) 07/28/2015  . PEG (percutaneous endoscopic gastrostomy) status ([place]) 07/28/2015  . History of stroke 07/28/2015  . Primary gout   . Mental status change   . Dysphagia   . Acute respiratory failure with hypoxemia ([place])   . Pulmonary edema   . Tracheal perforation   . CAP (community acquired pneumonia)   . Chronic systolic CHF (congestive heart failure) ([place])   . Acute renal failure ([place])   . Alcohol abuse   . Anoxic brain injury ([place])   . Thrombocytopenia ([place])   . History of ETT   . Cardiac arrest ([place]) 07/08/2015  . 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 Hyperlipidemia 11/24/2006  . Gout, unspecified 11/24/2006  . HYPERTENSION, BENIGN SYSTEMIC 11/24/2006  . HEMORRHOIDS, NOS 11/24/2006  . DIVERTICULOSIS OF COLON 11/24/2006    Orientation RESPIRATION BLADDER Height & Weight     Self, Time, Situation, Place  Normal Incontinent Weight: 178 lb 5.6 oz (80.9 kg) Height:  5\' 8"  (172.7 cm)   BEHAVIORAL SYMPTOMS/MOOD NEUROLOGICAL BOWEL NUTRITION STATUS      Continent Diet(Heart healthy, thin liquids)  AMBULATORY STATUS COMMUNICATION OF NEEDS Skin   Extensive Assist Verbally Normal                       Personal Care Assistance Level of Assistance  Bathing, Feeding, Dressing Bathing Assistance: Maximum assistance Feeding assistance: Independent Dressing Assistance: Maximum assistance     Functional Limitations Info  Sight, Hearing, Speech Sight Info: Adequate Hearing Info: Adequate Speech Info: Adequate    SPECIAL CARE FACTORS FREQUENCY  PT (By licensed PT), OT (By licensed OT)     PT Frequency: 2x OT Frequency: 2x            Contractures Contractures Info: Not present    Additional Factors Info  Code Status, Allergies Code Status Info: Full Code Allergies Info: Hydrochlorothiazide, Pseudoephedrine           Current Medications (06/11/2018):  This is the current hospital active medication list Current Facility-Administered Medications  Medication Dose Route Frequency Provider Last Rate Last Dose  . 0.9 %  sodium chloride infusion   Intravenous Continuous Dana Allan I, MD 75 mL/hr at 06/11/18 0857    . acetaminophen (TYLENOL) tablet 650 mg  650 mg Oral Q4H PRN Elwyn Reach, MD   650 mg at 06/11/18 0846   Or  . acetaminophen (TYLENOL) solution 650 mg  650 mg Per Tube Q4H PRN Elwyn Reach, MD       Or  .  acetaminophen (TYLENOL) suppository 650 mg  650 mg Rectal Q4H PRN Gala Romney L, MD      . atorvastatin (LIPITOR) tablet 40 mg  40 mg Oral q1800 Elwyn Reach, MD   40 mg at 06/10/18 1724  . colchicine tablet 0.6 mg  0.6 mg Oral Once per day on Mon Thu Garba, Mohammad L, MD      . febuxostat (ULORIC) tablet 80 mg  80 mg Oral Daily Gala Romney L, MD   80 mg at 06/10/18 1324  . folic acid (FOLVITE) tablet 1 mg  1 mg Oral Daily Gala Romney L, MD   1 mg at 06/10/18 1324  . levETIRAcetam (KEPPRA) tablet 500 mg  500 mg  Oral BID Rinehuls, David L, PA-C   500 mg at 06/10/18 2242  . LORazepam (ATIVAN) tablet 1 mg  1 mg Oral Q6H PRN Rai, Ripudeep K, MD   1 mg at 06/09/18 0536   Or  . LORazepam (ATIVAN) injection 1 mg  1 mg Intravenous Q6H PRN Rai, Ripudeep K, MD   1 mg at 06/10/18 0516  . metoprolol tartrate (LOPRESSOR) tablet 50 mg  50 mg Oral BID Elwyn Reach, MD   50 mg at 06/10/18 2242  . multivitamin (PROSIGHT) tablet 1 tablet  1 tablet Oral Daily Elwyn Reach, MD   1 tablet at 06/10/18 1323  . pantoprazole (PROTONIX) EC tablet 40 mg  40 mg Oral Daily Gala Romney L, MD   40 mg at 06/10/18 1322  . Rivaroxaban (XARELTO) tablet 15 mg  15 mg Oral Daily Karren Cobble, RPH   15 mg at 06/10/18 1323  . senna-docusate (Senokot-S) tablet 1 tablet  1 tablet Oral QHS PRN Gala Romney L, MD      . sodium chloride flush (NS) 0.9 % injection 10-40 mL  10-40 mL Intracatheter PRN Dana Allan I, MD      . thiamine 500mg  in normal saline (72ml) IVPB  500 mg Intravenous Q8H Amie Portland, MD 100 mL/hr at 06/10/18 2250 500 mg at 06/10/18 2250     Discharge Medications: Please see discharge summary for a list of discharge medications.  Relevant Imaging Results:  Relevant Lab Results:   Additional Information SSN: [id number]  Eileen Stanford, LCSW

## 2018-06-11 NOTE — Progress Notes (Signed)
Pt refused to have PIV inserted---- pt very uncooperative and became verbally loud when procedure explained; pt stated, "I don't need an IV!".  Primary RN was notified of pt's refusal.  Pt has pulled out previously inserted PIV line.

## 2018-06-12 DIAGNOSIS — R410 Disorientation, unspecified: Secondary | ICD-10-CM

## 2018-06-12 DIAGNOSIS — I160 Hypertensive urgency: Secondary | ICD-10-CM

## 2018-06-12 LAB — CULTURE, BLOOD (ROUTINE X 2)
Culture: NO GROWTH
Culture: NO GROWTH
SPECIAL REQUESTS: ADEQUATE
Special Requests: ADEQUATE

## 2018-06-12 MED ORDER — QUETIAPINE FUMARATE 25 MG PO TABS
12.5000 mg | ORAL_TABLET | Freq: Two times a day (BID) | ORAL | Status: DC
  Administered 2018-06-12 – 2018-06-14 (×5): 12.5 mg via ORAL
  Filled 2018-06-12 (×4): qty 1

## 2018-06-12 MED ORDER — LORAZEPAM 2 MG/ML IJ SOLN
1.0000 mg | Freq: Once | INTRAMUSCULAR | Status: AC
  Administered 2018-06-12: 1 mg via INTRAVENOUS
  Filled 2018-06-12: qty 1

## 2018-06-12 MED ORDER — MAGNESIUM SULFATE IN D5W 1-5 GM/100ML-% IV SOLN
1.0000 g | Freq: Once | INTRAVENOUS | Status: AC
  Administered 2018-06-12: 1 g via INTRAVENOUS
  Filled 2018-06-12: qty 100

## 2018-06-12 MED ORDER — THIAMINE HCL 100 MG/ML IJ SOLN
500.0000 mg | Freq: Three times a day (TID) | INTRAVENOUS | Status: DC
  Administered 2018-06-12 – 2018-06-14 (×7): 500 mg via INTRAVENOUS
  Filled 2018-06-12 (×9): qty 5

## 2018-06-12 NOTE — Progress Notes (Signed)
[facility]                                                                              Patient Demographics  Mitchell [name], is a 67 y.o. male, DOB - 1951/05/17, JOI:[id number]  Admit date - 06/07/2018   Admitting Physician Elwyn Reach, MD  Outpatient Primary MD for the patient is Mitchell Beard, MD  Outpatient specialists:   LOS - 5  days   Medical records reviewed and are as summarized below:    Chief Complaint  Patient presents with  . 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  He was suspected to have a seizure due to alcohol withdrawal. Patient was last seen normal at 3:30 AM around 4 AM his wife found him unresponsive.  She called paramedics who came by and try to bring patient into the hospital.  En route, patient had seizure and was given 2.5 mg of Versed.  MRI of the brain was negative for acute infarct, which revealed chronic infarct right parietal lobe, mild chronic microvascular ischemic change in the white matter.  MRA head was negative.    06/10/2018: Staff reported that it was difficult managing the patient's behavior last night.  Patient was said to be spilling feces all over the room.  Patient is currently sleeping quietly.  His wife reported episodes of visual hallucination.  Previous EKG done revealed prolonged QTc interval (530 ms).  Will repeat another EKG.  Will check electrolytes, and correct abnormal potassium or magnesium.  Will repeat EKG in the morning.  Will hold off on any antipsychotics for now.  Will avoid benzodiazepines if possible.  06/11/2018: Patient continues to pose difficulties at nighttime, and tends to fall asleep in the morning.  For skilled nursing facility placement.  Repeat EKG reviewed, QTC is still prolonged.  06/12/2018: Patient continues  to pose management difficulty at nighttime.  Likely, patient has acute delirium, exact etiology is unclear.  EKG done this morning revealed QTC of 471 ms.  Will start patient on low-dose quetiapine.  Will monitor EKG daily to check QTc interval.  Hematuria is noted.  Will repeat urinalysis, and proceed with renal ultrasound.  Results of renal ultrasound done on 01/09/2007 revealed - "Right kidney measures 12.4 cm. A subcentimeter anechoic lesion with increased through transmission is seen in the lower pole. Left kidney measures 11.5 cm. Parenchymal echotexture is otherwise uniform without mass, stone, or hydronephrosis. Bladder unremarkable. Prostate measures approximately 4.3 x 3.0 x 4.5 cm".   Elevated blood pressure noted.  Will change metoprolol to Coreg 12.5 mg p.o. twice daily.  Will add Norvasc 5 mg p.o. once daily.  Will be careful with blood pressure control as the elevated blood pressure may be due to agitation at nighttime.     Assessment & Plan    Principal Problem: Acute delirium: Possibly multifactorial. Prior urinalysis and chest x-ray done were not indicative of possible infective process.   Will start  patient on low-dose quetiapine (12.5 mg p.o. twice daily) Will monitor EKG daily for now to assess QTc interval. EKG done today revealed QTc interval of 471 ms. Prior urinalysis revealed hematuria, will proceed with renal ultrasound.  Results of previous renal ultrasound is noted. We will continue to assess patient. Plan is for skilled nursing facility placement, short versus long-term, one patient is optimized.   Patient's wife updated.  Acute/subacute CVA (cerebral vascular accident) [facility]) -Presented with unresponsive episode.  CT head showed hypodensity in the high right parietal lobe consistent with acute to subacute infarct -Alert and awake today, not a good historian, MRI brain, MRA pending -Follow 2D echo, stroke work-up, PT OT evaluation -Speech therapy evaluation  recommended home health SLP -Currently on aspirin, Xarelto, statin -LDL 62 A1c 5.2 -Per neurology, if MRI consistent with cortical edema due to seizure, rather than CVA, discontinue aspirin 06/12/2018: MRI brain revealed chronic changes.  No acute changes.  Seizure: Possibly due to alcohol versus CVA -Patient was placed on Keppra in ED. currently on CIWA scale with Ativan -Continue seizure precaution Minimize benzodiazepine use. 06/12/2018: No further seizures noted.  Continue Keppra 500 mg p.o. twice daily.  Acute kidney injury on chronic kidney disease stage III -Patient presented with creatinine function of 2.5, lactic acidosis 13.0 -Hold furosemide today, continue gentle hydration for 2 L then KVO -Baseline creatinine ~ 2.0 -06/10/2018: Acute kidney injury is resolving.  Serum creatinine is down to 1.58.  Continue IV fluids.  Continue to monitor renal function and electrolytes. 06/12/2018: Continue to monitor renal function closely.  Restart IV fluids.  Repeat BMP in a.m.  Lactic acidosis -Likely due to seizures, lactic acid 13.0 at the time of admission, CK normal, -Resolved   Hyperlipidemia -LDL 62, continue Lipitor  Gout, unspecified -Stable, no acute issues, continue Uloric  HYPERTENSION, BENIGN SYSTEMIC -Uncontrolled.   Discontinue Lopressor  Start Coreg 12.5 mg p.o. twice daily  Start Norvasc 5 mg p.o. once daily  Monitor blood pressure closely.   Chronic systolic CHF (congestive heart failure) (HCC) -Currently compensated, euvolemic  Alcohol abuse -Patient counseled on alcohol cessation, continue CIWA scale with Ativan  Cannabis abuse:  Patient counseled.  Prolonged QTc interval: Continue to monitor.  Continue to optimize electrolytes and magnesium.   EKG done today revealed QTc interval of 471 ms.     Code Status: Full CODE. DVT Prophylaxis: Xarelto Family Communication: Discussed in detail with patient's wife.  Disposition Plan: SNF     Time Spent in  minutes 25  Procedures:  MRI brain  Consultants:   Neurology  Antimicrobials:      Medications  Scheduled Meds: . atorvastatin  40 mg Oral q1800  . colchicine  0.6 mg Oral Once per day on Mon Thu  . febuxostat  80 mg Oral Daily  . folic acid  1 mg Oral Daily  . levETIRAcetam  500 mg Oral BID  . metoprolol tartrate  50 mg Oral BID  . multivitamin  1 tablet Oral Daily  . pantoprazole  40 mg Oral Daily  . QUEtiapine  12.5 mg Oral BID  . rivaroxaban  15 mg Oral Daily   Continuous Infusions: . sodium chloride 75 mL/hr at 06/12/18 0540  . thiamine injection 500 mg (06/12/18 1509)   PRN Meds:.acetaminophen **OR** acetaminophen (TYLENOL) oral liquid 160 mg/5 mL **OR** acetaminophen, senna-docusate, sodium chloride flush   Antibiotics   Anti-infectives (From admission, onward)   Start     Dose/Rate Route Frequency Ordered Stop   06/07/18 1715  piperacillin-tazobactam (ZOSYN) IVPB 3.375 g     3.375 g 100 mL/hr over 30 Minutes Intravenous  Once 06/07/18 1705 06/07/18 1813   06/07/18 1715  vancomycin (VANCOCIN) IVPB 1000 mg/200 mL premix     1,000 mg 200 mL/hr over 60 Minutes Intravenous  Once 06/07/18 1705 06/07/18 1923        Subjective:   Armonte Tortorella was seen and examined today.  Patient is sleeping quietly.  The patient's behavior was said to have been difficult to manage last night.   Objective:   Vitals:   06/11/18 1946 06/11/18 2305 06/12/18 0332 06/12/18 1202  BP: (!) 176/87 (!) 162/88 (!) 157/73 (!) 175/85  Pulse: 74 73 75 79  Resp: 18 18 18 18   Temp: 98.1 F (36.7 C) 98.1 F (36.7 C) 98.1 F (36.7 C) 99.4 F (37.4 C)  TempSrc: Oral Oral Oral Axillary  SpO2: 99% 100% 99% 99%  Weight:      Height:        Intake/Output Summary (Last 24 hours) at 06/12/2018 1517 Last data filed at 06/12/2018 0900 Gross per 24 hour  Intake 1810.97 ml  Output -  Net 1810.97 ml     Wt Readings from Last 3 Encounters:  06/07/18 80.9 kg  03/09/17 86.2 kg  08/14/15  73.7 kg     Exam  General: Alert and oriented x self and place, NAD  Eyes:   HEENT:  Atraumatic, normocephalic,  Cardiovascular: S1 S2 auscultated, Regular rate and rhythm.  Respiratory: Clear to auscultation bilaterally, no wheezing, rales or rhonchi  Gastrointestinal: Soft, nontender, nondistended, + bowel sounds  Ext: no pedal edema bilaterally  Neuro: Sleeping quietly.    Data Reviewed:  I have personally reviewed following labs and imaging studies  Micro Results Recent Results (from the past 240 hour(s))  Blood Culture (routine x 2)     Status: None   Collection Time: 06/07/18  5:30 PM  Result Value Ref Range Status   Specimen Description BLOOD RIGHT ANTECUBITAL  Final   Special Requests   Final    BOTTLES DRAWN AEROBIC AND ANAEROBIC Blood Culture adequate volume   Culture   Final    NO GROWTH 5 DAYS Performed at [facility], [address] [place], [place] [address]    Report Status 06/12/2018 FINAL  Final  Blood Culture (routine x 2)     Status: None   Collection Time: 06/07/18  5:30 PM  Result Value Ref Range Status   Specimen Description BLOOD RIGHT FOREARM  Final   Special Requests   Final    BOTTLES DRAWN AEROBIC AND ANAEROBIC Blood Culture adequate volume   Culture   Final    NO GROWTH 5 DAYS Performed at [facility], [place] [address] [place], [place] [address]    Report Status 06/12/2018 FINAL  Final  Culture, Urine     Status: None   Collection Time: 06/10/18 10:36 AM  Result Value Ref Range Status   Specimen Description URINE, CLEAN CATCH  Final   Special Requests NONE  Final   Culture   Final    NO GROWTH Performed at [facility], [place] [address] [place], [place] [address]    Report Status 06/11/2018 FINAL  Final    Radiology Reports Ct Head Wo Contrast  Result Date: 06/07/2018 CLINICAL DATA:  Altered LOC, witnessed seizure EXAM: CT HEAD WITHOUT CONTRAST CT CERVICAL SPINE WITHOUT CONTRAST TECHNIQUE: Multidetector CT  imaging of the head and cervical spine  was performed following the standard protocol without intravenous contrast. Multiplanar CT image reconstructions of the cervical spine were also generated. COMPARISON:  MRI 07/17/2015, CT brain 07/08/2015 FINDINGS: CT HEAD FINDINGS Brain: Hypodensity with loss of the gray-white matter differentiation in the right high parietal lobe consistent with acute to subacute infarct. No hemorrhage. No mass. Mild atrophy. Stable ventricle size. Vascular: No hyperdense vessels.  Carotid vascular calcification Skull: Normal. Negative for fracture or focal lesion. Sinuses/Orbits: No acute finding. Mucous retention cyst in the left maxillary sinus. Other: None CT CERVICAL SPINE FINDINGS Alignment: Motion degraded study which limits evaluation for fracture. Facet alignment within normal limits. Skull base and vertebrae: Craniovertebral junction is intact. No obvious fracture allowing for motion Soft tissues and spinal canal: No prevertebral fluid or swelling. No visible canal hematoma. Disc levels: Moderate degenerative change at C3-C4, C5-C6 and C6-C7. Upper chest: Negative. Other: None IMPRESSION: 1. Hypodensity within the high right parietal lobe, consistent with acute to subacute infarct. No hemorrhage. Atrophy. 2. Motion degraded images of the cervical spine which limits evaluation for fracture. No definite acute osseous abnormality is seen. Electronically Signed   By: Donavan Foil M.D.   On: 06/07/2018 19:41   Ct Cervical Spine Wo Contrast  Result Date: 06/07/2018 CLINICAL DATA:  Altered LOC, witnessed seizure EXAM: CT HEAD WITHOUT CONTRAST CT CERVICAL SPINE WITHOUT CONTRAST TECHNIQUE: Multidetector CT imaging of the head and cervical spine was performed following the standard protocol without intravenous contrast. Multiplanar CT image reconstructions of the cervical spine were also generated. COMPARISON:  MRI 07/17/2015, CT brain 07/08/2015 FINDINGS: CT HEAD FINDINGS Brain:  Hypodensity with loss of the gray-white matter differentiation in the right high parietal lobe consistent with acute to subacute infarct. No hemorrhage. No mass. Mild atrophy. Stable ventricle size. Vascular: No hyperdense vessels.  Carotid vascular calcification Skull: Normal. Negative for fracture or focal lesion. Sinuses/Orbits: No acute finding. Mucous retention cyst in the left maxillary sinus. Other: None CT CERVICAL SPINE FINDINGS Alignment: Motion degraded study which limits evaluation for fracture. Facet alignment within normal limits. Skull base and vertebrae: Craniovertebral junction is intact. No obvious fracture allowing for motion Soft tissues and spinal canal: No prevertebral fluid or swelling. No visible canal hematoma. Disc levels: Moderate degenerative change at C3-C4, C5-C6 and C6-C7. Upper chest: Negative. Other: None IMPRESSION: 1. Hypodensity within the high right parietal lobe, consistent with acute to subacute infarct. No hemorrhage. Atrophy. 2. Motion degraded images of the cervical spine which limits evaluation for fracture. No definite acute osseous abnormality is seen. Electronically Signed   By: Donavan Foil M.D.   On: 06/07/2018 19:41   Mr Jodene Nam Head Wo Contrast  Result Date: 06/08/2018 CLINICAL DATA:  Stroke.  Seizure.  Found unresponsive. EXAM: MRI HEAD WITHOUT CONTRAST MRA HEAD WITHOUT CONTRAST TECHNIQUE: Multiplanar, multiecho pulse sequences of the brain and surrounding structures were obtained without intravenous contrast. Angiographic images of the head were obtained using MRA technique without contrast. COMPARISON:  CT head 06/07/2018 FINDINGS: MRI HEAD FINDINGS Brain: Negative for acute infarct. Chronic infarct with laminar necrosis right parietal lobe corresponding to the hypodensity on CT. Mild associated hemorrhage in the right parietal infarct. Mild chronic microvascular ischemic change in the white matter. Mild generalized atrophy. Negative for mass or midline shift.  Vascular: Normal arterial flow voids. Skull and upper cervical spine: Negative Sinuses/Orbits: Mild mucosal edema paranasal sinuses.  Normal orbit Other: None MRA HEAD FINDINGS Both vertebral arteries are patent to the basilar without stenosis. PICA patent bilaterally. Basilar widely patent.  Superior cerebellar arteries are patent bilaterally. Fetal origin of the posterior cerebral artery bilaterally with hypoplastic P1 segments bilaterally. Internal carotid artery widely patent bilaterally. Anterior and middle cerebral arteries patent bilaterally without stenosis. Negative for cerebral aneurysm. IMPRESSION: Negative for acute infarct Chronic infarct right parietal lobe. Mild chronic microvascular ischemic change in the white matter Negative MRA head Electronically Signed   By: Franchot Gallo M.D.   On: 06/08/2018 10:59   Mr Brain Wo Contrast  Result Date: 06/08/2018 CLINICAL DATA:  Stroke.  Seizure.  Found unresponsive. EXAM: MRI HEAD WITHOUT CONTRAST MRA HEAD WITHOUT CONTRAST TECHNIQUE: Multiplanar, multiecho pulse sequences of the brain and surrounding structures were obtained without intravenous contrast. Angiographic images of the head were obtained using MRA technique without contrast. COMPARISON:  CT head 06/07/2018 FINDINGS: MRI HEAD FINDINGS Brain: Negative for acute infarct. Chronic infarct with laminar necrosis right parietal lobe corresponding to the hypodensity on CT. Mild associated hemorrhage in the right parietal infarct. Mild chronic microvascular ischemic change in the white matter. Mild generalized atrophy. Negative for mass or midline shift. Vascular: Normal arterial flow voids. Skull and upper cervical spine: Negative Sinuses/Orbits: Mild mucosal edema paranasal sinuses.  Normal orbit Other: None MRA HEAD FINDINGS Both vertebral arteries are patent to the basilar without stenosis. PICA patent bilaterally. Basilar widely patent. Superior cerebellar arteries are patent bilaterally. Fetal  origin of the posterior cerebral artery bilaterally with hypoplastic P1 segments bilaterally. Internal carotid artery widely patent bilaterally. Anterior and middle cerebral arteries patent bilaterally without stenosis. Negative for cerebral aneurysm. IMPRESSION: Negative for acute infarct Chronic infarct right parietal lobe. Mild chronic microvascular ischemic change in the white matter Negative MRA head Electronically Signed   By: Franchot Gallo M.D.   On: 06/08/2018 10:59   Dg Chest Port 1 View  Result Date: 06/10/2018 CLINICAL DATA:  Visual hallucination EXAM: PORTABLE CHEST 1 VIEW COMPARISON:  Chest x-rays dated 06/07/2018 and 07/26/2015. FINDINGS: Heart size is upper normal, stable. Mediastinal width is slightly more prominent on today's study, likely related to patient's semi-erect positioning. Lungs are clear. No pleural effusion or pneumothorax seen. Osseous structures about the chest are unremarkable. IMPRESSION: 1. No active disease.  No evidence of pneumonia or pulmonary edema. 2. Mediastinal width is slightly more prominent on today's study, but this is most likely related to patient's semi-erect positioning in the absence of chest pain. Electronically Signed   By: Franki Cabot M.D.   On: 06/10/2018 13:09   Dg Chest Port 1 View  Result Date: 06/07/2018 CLINICAL DATA:  Seizure today EXAM: PORTABLE CHEST 1 VIEW COMPARISON:  July 26, 2015 FINDINGS: The heart size and mediastinal contours are within normal limits. Aorta is tortuous both lungs are clear. The visualized skeletal structures are stable. IMPRESSION: No active cardiopulmonary disease. Electronically Signed   By: Abelardo Diesel M.D.   On: 06/07/2018 16:56    Lab Data:  CBC: Recent Labs  Lab 06/07/18 1645 06/07/18 1655  WBC 5.9  --   NEUTROABS 4.5  --   HGB 12.8* 14.3  HCT 42.0 42.0  MCV 101.0*  --   PLT 164  --    Basic Metabolic Panel: Recent Labs  Lab 06/07/18 1645 06/07/18 1655 06/08/18 0522 06/09/18 0459  06/10/18 0951 06/11/18 1729  NA 142 140 139 140 138 139  K 4.2 4.9 3.8 3.9 4.5 4.1  CL 108 110 114* 113* 114* 113*  CO2 15*  --  17* 16* 15* 17*  GLUCOSE 133* 128* 89 77 74 123*  BUN 10 15 10 11 8 11   CREATININE 2.58* 2.40* 2.04* 2.01* 1.58* 1.64*  CALCIUM 8.4*  --  7.3* 7.6* 7.7* 8.0*  MG  --   --   --   --  1.2* 1.7  PHOS  --   --   --   --  2.4* 2.8   GFR: Estimated Creatinine Clearance: 41.7 mL/min (A) (by C-G formula based on SCr of 1.64 mg/dL (H)). Liver Function Tests: Recent Labs  Lab 06/07/18 1645 06/10/18 0951 06/11/18 1729  AST 44*  --   --   ALT 11  --   --   ALKPHOS 92  --   --   BILITOT 1.0  --   --   PROT 7.1  --   --   ALBUMIN 3.2* 2.6* 2.5*   No results for input(s): LIPASE, AMYLASE in the last 168 hours. No results for input(s): AMMONIA in the last 168 hours. Coagulation Profile: No results for input(s): INR, PROTIME in the last 168 hours. Cardiac Enzymes: Recent Labs  Lab 06/07/18 1645  CKTOTAL 188   BNP (last 3 results) No results for input(s): PROBNP in the last 8760 hours. HbA1C: No results for input(s): HGBA1C in the last 72 hours. CBG: Recent Labs  Lab 06/07/18 1621  GLUCAP 120*   Lipid Profile: No results for input(s): CHOL, HDL, LDLCALC, TRIG, CHOLHDL, LDLDIRECT in the last 72 hours. Thyroid Function Tests: No results for input(s): TSH, T4TOTAL, FREET4, T3FREE, THYROIDAB in the last 72 hours. Anemia Panel: No results for input(s): VITAMINB12, FOLATE, FERRITIN, TIBC, IRON, RETICCTPCT in the last 72 hours. Urine analysis:    Component Value Date/Time   COLORURINE YELLOW 06/10/2018 1140   APPEARANCEUR CLEAR 06/10/2018 1140   LABSPEC 1.011 06/10/2018 1140   PHURINE 5.0 06/10/2018 1140   GLUCOSEU NEGATIVE 06/10/2018 1140   HGBUR LARGE (A) 06/10/2018 1140   BILIRUBINUR NEGATIVE 06/10/2018 1140   KETONESUR 5 (A) 06/10/2018 1140   PROTEINUR 100 (A) 06/10/2018 1140   UROBILINOGEN 1.0 07/19/2015 1739   NITRITE NEGATIVE 06/10/2018  1140   LEUKOCYTESUR NEGATIVE 06/10/2018 1140     Bonnell Public M.D. [facility] 06/12/2018, 3:17 PM  Between 7am to 7pm - call Pager - [phone].  After 7pm go to [email or web address] - password [email or web address]  Call night coverage person covering after 7pm

## 2018-06-12 NOTE — Clinical Social Work Note (Signed)
Patient not oriented. No supports at bedside. Left voicemail for wife. Will discuss SNF placement when she calls back.  Dayton Scrape, [place]

## 2018-06-12 NOTE — Consult Note (Signed)
[facility] CM Primary Care Navigator  06/12/2018  Collen Vincent October 09, 1950 [id number]   Met with patient and wife [name]) at the bedside to identify possible discharge needs. Patienthas been resting/ dozing on and off during this visit.  Wife statesthatshe found patient "unconscious and unresponsive in the bedroom floor" that resulted to this admission. (altered mental status suspected to have seizure due to alcohol withdrawal; CT of head showed possible acute to subacute infarct)  Patient's wife endorsesDr. [name] with [place] primary care provider.    [facility] [address] [facility].   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  Patient's wife verbalized thatshe had been mostly driving and providing transportation to hisdoctors' appointments.  Patient lives with wife at home. Wife is the primary caregiver for patient. Daughter [name]- lives nearby) is able to assist with the care of patient when available as well.  Anticipated discharge plan isskilled nursing facility (SNF)for rehabilitation per therapy recommendation.  Patient's wifevoiced understanding to call primary care provider's office once he gets back home,for a post discharge follow-up appointment within1- 2 weeksor sooner if needs arise.Patient letter (with PCP's contact number) wasprovided as a reminder.  Discussed with patient's wife regarding [facility] CM services available for health management and resourcesat homeand she denies any current needs or concerns for now.  Wifevoicedunderstandingto seekreferralfrom primary care provider to [facility] care management ifdeemed necessary and appropriatefor any services in thenearfuture- oncepatient returnsbackhome.  [facility] care management information was provided for future needsthathemay have.   For additional questions please  contact:  Edwena Felty A. Mattilyn Crites, BSN, RN-BC [facility] PRIMARY CARE Navigator Cell: [phone]

## 2018-06-13 ENCOUNTER — Inpatient Hospital Stay (HOSPITAL_COMMUNITY): Payer: Medicare Other

## 2018-06-13 DIAGNOSIS — R319 Hematuria, unspecified: Secondary | ICD-10-CM

## 2018-06-13 LAB — BASIC METABOLIC PANEL
Anion gap: 6 (ref 5–15)
BUN: 9 mg/dL (ref 8–23)
CO2: 19 mmol/L — ABNORMAL LOW (ref 22–32)
Calcium: 8 mg/dL — ABNORMAL LOW (ref 8.9–10.3)
Chloride: 115 mmol/L — ABNORMAL HIGH (ref 98–111)
Creatinine, Ser: 1.64 mg/dL — ABNORMAL HIGH (ref 0.61–1.24)
GFR calc Af Amer: 48 mL/min — ABNORMAL LOW (ref >60)
GFR calc non Af Amer: 41 mL/min — ABNORMAL LOW (ref >60)
Glucose, Bld: 105 mg/dL — ABNORMAL HIGH (ref 70–99)
Potassium: 4 mmol/L (ref 3.5–5.1)
Sodium: 140 mmol/L (ref 135–145)

## 2018-06-13 LAB — CBC WITH DIFFERENTIAL/PLATELET
Abs Immature Granulocytes: 0.1 10*3/uL (ref 0.0–0.1)
Basophils Absolute: 0 10*3/uL (ref 0.0–0.1)
Basophils Relative: 0 %
Eosinophils Absolute: 0.1 10*3/uL (ref 0.0–0.7)
Eosinophils Relative: 1 %
HCT: 28.7 % — ABNORMAL LOW (ref 39.0–52.0)
Hemoglobin: 9.3 g/dL — ABNORMAL LOW (ref 13.0–17.0)
Immature Granulocytes: 1 %
Lymphocytes Relative: 11 %
Lymphs Abs: 0.8 10*3/uL (ref 0.7–4.0)
MCH: 31.2 pg (ref 26.0–34.0)
MCHC: 32.4 g/dL (ref 30.0–36.0)
MCV: 96.3 fL (ref 78.0–100.0)
Monocytes Absolute: 1.2 10*3/uL — ABNORMAL HIGH (ref 0.1–1.0)
Monocytes Relative: 18 %
Neutro Abs: 4.7 10*3/uL (ref 1.7–7.7)
Neutrophils Relative %: 69 %
Platelets: 160 10*3/uL (ref 150–400)
RBC: 2.98 MIL/uL — ABNORMAL LOW (ref 4.22–5.81)
RDW: 14.3 % (ref 11.5–15.5)
WBC: 6.8 10*3/uL (ref 4.0–10.5)

## 2018-06-13 LAB — RENAL FUNCTION PANEL
Albumin: 2.3 g/dL — ABNORMAL LOW (ref 3.5–5.0)
Anion gap: 10 (ref 5–15)
BUN: 9 mg/dL (ref 8–23)
CO2: 17 mmol/L — ABNORMAL LOW (ref 22–32)
Calcium: 8.2 mg/dL — ABNORMAL LOW (ref 8.9–10.3)
Chloride: 112 mmol/L — ABNORMAL HIGH (ref 98–111)
Creatinine, Ser: 1.61 mg/dL — ABNORMAL HIGH (ref 0.61–1.24)
GFR calc Af Amer: 49 mL/min — ABNORMAL LOW (ref >60)
GFR calc non Af Amer: 42 mL/min — ABNORMAL LOW (ref >60)
Glucose, Bld: 93 mg/dL (ref 70–99)
Phosphorus: 2.5 mg/dL (ref 2.5–4.6)
Potassium: 5.9 mmol/L — ABNORMAL HIGH (ref 3.5–5.1)
Sodium: 139 mmol/L (ref 135–145)

## 2018-06-13 LAB — URINALYSIS, ROUTINE W REFLEX MICROSCOPIC
Bacteria, UA: NONE SEEN
Bilirubin Urine: NEGATIVE
Glucose, UA: NEGATIVE mg/dL
Ketones, ur: NEGATIVE mg/dL
Leukocytes, UA: NEGATIVE
Nitrite: NEGATIVE
Protein, ur: 30 mg/dL — AB
Specific Gravity, Urine: 1.003 — ABNORMAL LOW (ref 1.005–1.030)
pH: 6 (ref 5.0–8.0)

## 2018-06-13 LAB — MAGNESIUM: Magnesium: 1.7 mg/dL (ref 1.7–2.4)

## 2018-06-13 MED ORDER — AMLODIPINE BESYLATE 5 MG PO TABS
5.0000 mg | ORAL_TABLET | Freq: Every day | ORAL | Status: DC
Start: 2018-06-13 — End: 2018-06-14
  Administered 2018-06-13 – 2018-06-14 (×2): 5 mg via ORAL
  Filled 2018-06-13 (×2): qty 1

## 2018-06-13 MED ORDER — CARVEDILOL 12.5 MG PO TABS
12.5000 mg | ORAL_TABLET | Freq: Two times a day (BID) | ORAL | Status: DC
  Administered 2018-06-13 – 2018-06-14 (×3): 12.5 mg via ORAL
  Filled 2018-06-13 (×3): qty 1

## 2018-06-13 NOTE — Progress Notes (Signed)
error 

## 2018-06-13 NOTE — Progress Notes (Signed)
[facility]                                                                              Patient Demographics  Mitchell [name], is a 67 y.o. male, DOB - 08/27/1951, LFY:[id number]  Admit date - 06/07/2018   Admitting Physician Elwyn Reach, MD  Outpatient Primary MD for the patient is [name], MD  Outpatient specialists:   LOS - 6  days   Medical records reviewed and are as summarized below:    Chief Complaint  Patient presents with  . 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  He was suspected to have a seizure due to alcohol withdrawal. Patient was last seen normal at 3:30 AM around 4 AM his wife found him unresponsive.  She called paramedics who came by and try to bring patient into the hospital.  En route, patient had seizure and was given 2.5 mg of Versed.  MRI of the brain was negative for acute infarct, which revealed chronic infarct right parietal lobe, mild chronic microvascular ischemic change in the white matter.  MRA head was negative.  Patient has posed management problems, mainly at nighttime.  Patient was noted on one occasion to be smearing feces on the wall.  Patient's wife was also concerned about some memory issues.  Patient may have possible undiagnosed vascular dementia or dementing process relating to patient's alcohol use.  This could also be acute delirium.  Patient is not on quetiapine 12.5 mg p.o. twice daily.  Patient has prolonged QTc interval.  EKG is monitored closely.  Last QTc interval was 472 ms.  Patient's behavior has improved with quetiapine.  Physical therapy has recommended skilled nursing facility placement for now, query short-term versus long-term.  Patient has had hematuria, but ultrasound of the kidney done earlier today revealed simple renal  cyst and mild enlargement of the prostate.  Blood pressure is also being cautiously optimized.  Assessment & Plan    Principal Problem: Acute delirium: Possibly multifactorial. Prior urinalysis and chest x-ray done were not indicative of possible infective process.   Continue low-dose quetiapine (12.5 mg p.o. twice daily) Monitor EKG as patient has had prolonged QTc interval.  Last QTc interval was 472 ms Prior urinalysis revealed hematuria, but renal ultrasound is nonrevealing.  Plan is for skilled nursing facility placement, short versus long-term, once patient is optimized.   Patient may also have undiagnosed vascular dementia versus dementia secondary to alcohol use.  Acute/subacute CVA (cerebral vascular accident) [facility]) -Presented with unresponsive episode.  CT head showed hypodensity in the high right parietal lobe consistent with acute to subacute infarct -Alert and awake today, not a good historian, MRI brain, MRA pending -Follow 2D echo, stroke work-up, PT OT evaluation -Speech therapy evaluation recommended home health SLP -Currently on aspirin, Xarelto, statin -LDL 62 A1c 5.2 -Per neurology, if MRI consistent with cortical edema due to seizure, rather than CVA, discontinue aspirin MRI brain revealed chronic  changes.  No acute changes.  Seizure: Possibly due to alcohol versus CVA -Patient was placed on Keppra in ED. currently on CIWA scale with Ativan -Continue seizure precaution Minimize benzodiazepine use. No further seizures noted.  Continue Keppra 500 mg p.o. twice daily.  Acute kidney injury on chronic kidney disease stage III -Patient presented with creatinine function of 2.5, lactic acidosis 13.0 -Hold furosemide today, continue gentle hydration for 2 L then KVO -Baseline creatinine ~ 2.0 -06/10/2018: Acute kidney injury is resolving.  Serum creatinine is down to 1.58.  Continue IV fluids.  Continue to monitor renal function and electrolytes. Continue to monitor  renal function closely.  Continue IV fluids for now.   Lactic acidosis -Likely due to seizures, lactic acid 13.0 at the time of admission, CK normal, -Resolved   Hyperlipidemia -LDL 62, continue Lipitor  Gout, unspecified -Stable, no acute issues, continue Uloric  HYPERTENSION, BENIGN SYSTEMIC -Uncontrolled.   Discontinue Lopressor  Continue Coreg 12.5 mg p.o. twice daily  Continue Norvasc 5 mg p.o. once daily  Monitor blood pressure closely, and adjust medication accordingly.   Chronic systolic CHF (congestive heart failure) (HCC) -Currently compensated.  Alcohol abuse -Patient counseled on alcohol cessation, continue CIWA scale with Ativan  Cannabis abuse:  Patient counseled.  Prolonged QTc interval: Continue to monitor.  Continue to optimize electrolytes and magnesium.       Code Status: Full CODE. DVT Prophylaxis: Xarelto Family Communication: Discussed in detail with patient's wife.  Disposition Plan: SNF     Time Spent in minutes 25  Procedures:  MRI brain  Consultants:   Neurology  Antimicrobials:      Medications  Scheduled Meds: . amLODipine  5 mg Oral Daily  . atorvastatin  40 mg Oral q1800  . carvedilol  12.5 mg Oral BID WC  . colchicine  0.6 mg Oral Once per day on Mon Thu  . febuxostat  80 mg Oral Daily  . folic acid  1 mg Oral Daily  . levETIRAcetam  500 mg Oral BID  . multivitamin  1 tablet Oral Daily  . pantoprazole  40 mg Oral Daily  . QUEtiapine  12.5 mg Oral BID  . rivaroxaban  15 mg Oral Daily   Continuous Infusions: . sodium chloride 75 mL/hr at 06/13/18 0428  . thiamine injection 500 mg (06/13/18 1455)   PRN Meds:.acetaminophen **OR** acetaminophen (TYLENOL) oral liquid 160 mg/5 mL **OR** acetaminophen, senna-docusate, sodium chloride flush   Antibiotics   Anti-infectives (From admission, onward)   Start     Dose/Rate Route Frequency Ordered Stop   06/07/18 1715  piperacillin-tazobactam (ZOSYN) IVPB 3.375 g     3.375  g 100 mL/hr over 30 Minutes Intravenous  Once 06/07/18 1705 06/07/18 1813   06/07/18 1715  vancomycin (VANCOCIN) IVPB 1000 mg/200 mL premix     1,000 mg 200 mL/hr over 60 Minutes Intravenous  Once 06/07/18 1705 06/07/18 1923        Subjective:   Mitchell [name] was seen and examined today.  Patient is sleeping quietly.  The patient's behavior was said to have been difficult to manage last night.   Objective:   Vitals:   06/12/18 1618 06/12/18 2334 06/13/18 0453 06/13/18 1819  BP: (!) 169/84 (!) 178/86 (!) 183/79 (!) 167/88  Pulse: 92 76 80 96  Resp: 18 20 20 16   Temp: 98.6 F (37 C) 98.8 F (37.1 C) 99.3 F (37.4 C) 99.8 F (37.7 C)  TempSrc: Axillary Oral Oral Oral  SpO2: 95% 100% 100%  100%  Weight:      Height:        Intake/Output Summary (Last 24 hours) at 06/13/2018 1903 Last data filed at 06/13/2018 1455 Gross per 24 hour  Intake 1184.39 ml  Output 1 ml  Net 1183.39 ml     Wt Readings from Last 3 Encounters:  06/07/18 80.9 kg  03/09/17 86.2 kg  08/14/15 73.7 kg     Exam  General: Alert and oriented x self and place, NAD  Eyes:   HEENT:  Atraumatic, normocephalic,  Cardiovascular: S1 S2 auscultated, Regular rate and rhythm.  Respiratory: Clear to auscultation bilaterally, no wheezing, rales or rhonchi  Gastrointestinal: Soft, nontender, nondistended, + bowel sounds  Ext: no pedal edema bilaterally  Neuro: Sleeping quietly.    Data Reviewed:  I have personally reviewed following labs and imaging studies  Micro Results Recent Results (from the past 240 hour(s))  Blood Culture (routine x 2)     Status: None   Collection Time: 06/07/18  5:30 PM  Result Value Ref Range Status   Specimen Description BLOOD RIGHT ANTECUBITAL  Final   Special Requests   Final    BOTTLES DRAWN AEROBIC AND ANAEROBIC Blood Culture adequate volume   Culture   Final    NO GROWTH 5 DAYS Performed at [facility], [address] [place], [place] [address]    Report  Status 06/12/2018 FINAL  Final  Blood Culture (routine x 2)     Status: None   Collection Time: 06/07/18  5:30 PM  Result Value Ref Range Status   Specimen Description BLOOD RIGHT FOREARM  Final   Special Requests   Final    BOTTLES DRAWN AEROBIC AND ANAEROBIC Blood Culture adequate volume   Culture   Final    NO GROWTH 5 DAYS Performed at [facility], [address], [place], [place] [address]    Report Status 06/12/2018 FINAL  Final  Culture, Urine     Status: None   Collection Time: 06/10/18 10:36 AM  Result Value Ref Range Status   Specimen Description URINE, CLEAN CATCH  Final   Special Requests NONE  Final   Culture   Final    NO GROWTH Performed at [facility], [place] [address], [place], [place] [address]    Report Status 06/11/2018 FINAL  Final    Radiology Reports Ct Head Wo Contrast  Result Date: 06/07/2018 CLINICAL DATA:  Altered LOC, witnessed seizure EXAM: CT HEAD WITHOUT CONTRAST CT CERVICAL SPINE WITHOUT CONTRAST TECHNIQUE: Multidetector CT imaging of the head and cervical spine was performed following the standard protocol without intravenous contrast. Multiplanar CT image reconstructions of the cervical spine were also generated. COMPARISON:  MRI 07/17/2015, CT brain 07/08/2015 FINDINGS: CT HEAD FINDINGS Brain: Hypodensity with loss of the gray-white matter differentiation in the right high parietal lobe consistent with acute to subacute infarct. No hemorrhage. No mass. Mild atrophy. Stable ventricle size. Vascular: No hyperdense vessels.  Carotid vascular calcification Skull: Normal. Negative for fracture or focal lesion. Sinuses/Orbits: No acute finding. Mucous retention cyst in the left maxillary sinus. Other: None CT CERVICAL SPINE FINDINGS Alignment: Motion degraded study which limits evaluation for fracture. Facet alignment within normal limits. Skull base and vertebrae: Craniovertebral junction is intact. No obvious fracture allowing for motion Soft  tissues and spinal canal: No prevertebral fluid or swelling. No visible canal hematoma. Disc levels: Moderate degenerative change at C3-C4, C5-C6 and C6-C7. Upper chest: Negative. Other: None IMPRESSION: 1. Hypodensity  within the high right parietal lobe, consistent with acute to subacute infarct. No hemorrhage. Atrophy. 2. Motion degraded images of the cervical spine which limits evaluation for fracture. No definite acute osseous abnormality is seen. Electronically Signed   By: Donavan Foil M.D.   On: 06/07/2018 19:41   Ct Cervical Spine Wo Contrast  Result Date: 06/07/2018 CLINICAL DATA:  Altered LOC, witnessed seizure EXAM: CT HEAD WITHOUT CONTRAST CT CERVICAL SPINE WITHOUT CONTRAST TECHNIQUE: Multidetector CT imaging of the head and cervical spine was performed following the standard protocol without intravenous contrast. Multiplanar CT image reconstructions of the cervical spine were also generated. COMPARISON:  MRI 07/17/2015, CT brain 07/08/2015 FINDINGS: CT HEAD FINDINGS Brain: Hypodensity with loss of the gray-white matter differentiation in the right high parietal lobe consistent with acute to subacute infarct. No hemorrhage. No mass. Mild atrophy. Stable ventricle size. Vascular: No hyperdense vessels.  Carotid vascular calcification Skull: Normal. Negative for fracture or focal lesion. Sinuses/Orbits: No acute finding. Mucous retention cyst in the left maxillary sinus. Other: None CT CERVICAL SPINE FINDINGS Alignment: Motion degraded study which limits evaluation for fracture. Facet alignment within normal limits. Skull base and vertebrae: Craniovertebral junction is intact. No obvious fracture allowing for motion Soft tissues and spinal canal: No prevertebral fluid or swelling. No visible canal hematoma. Disc levels: Moderate degenerative change at C3-C4, C5-C6 and C6-C7. Upper chest: Negative. Other: None IMPRESSION: 1. Hypodensity within the high right parietal lobe, consistent with acute to  subacute infarct. No hemorrhage. Atrophy. 2. Motion degraded images of the cervical spine which limits evaluation for fracture. No definite acute osseous abnormality is seen. Electronically Signed   By: Donavan Foil M.D.   On: 06/07/2018 19:41   Mr Jodene Nam Head Wo Contrast  Result Date: 06/08/2018 CLINICAL DATA:  Stroke.  Seizure.  Found unresponsive. EXAM: MRI HEAD WITHOUT CONTRAST MRA HEAD WITHOUT CONTRAST TECHNIQUE: Multiplanar, multiecho pulse sequences of the brain and surrounding structures were obtained without intravenous contrast. Angiographic images of the head were obtained using MRA technique without contrast. COMPARISON:  CT head 06/07/2018 FINDINGS: MRI HEAD FINDINGS Brain: Negative for acute infarct. Chronic infarct with laminar necrosis right parietal lobe corresponding to the hypodensity on CT. Mild associated hemorrhage in the right parietal infarct. Mild chronic microvascular ischemic change in the white matter. Mild generalized atrophy. Negative for mass or midline shift. Vascular: Normal arterial flow voids. Skull and upper cervical spine: Negative Sinuses/Orbits: Mild mucosal edema paranasal sinuses.  Normal orbit Other: None MRA HEAD FINDINGS Both vertebral arteries are patent to the basilar without stenosis. PICA patent bilaterally. Basilar widely patent. Superior cerebellar arteries are patent bilaterally. Fetal origin of the posterior cerebral artery bilaterally with hypoplastic P1 segments bilaterally. Internal carotid artery widely patent bilaterally. Anterior and middle cerebral arteries patent bilaterally without stenosis. Negative for cerebral aneurysm. IMPRESSION: Negative for acute infarct Chronic infarct right parietal lobe. Mild chronic microvascular ischemic change in the white matter Negative MRA head Electronically Signed   By: Franchot Gallo M.D.   On: 06/08/2018 10:59   Mr Brain Wo Contrast  Result Date: 06/08/2018 CLINICAL DATA:  Stroke.  Seizure.  Found unresponsive.  EXAM: MRI HEAD WITHOUT CONTRAST MRA HEAD WITHOUT CONTRAST TECHNIQUE: Multiplanar, multiecho pulse sequences of the brain and surrounding structures were obtained without intravenous contrast. Angiographic images of the head were obtained using MRA technique without contrast. COMPARISON:  CT head 06/07/2018 FINDINGS: MRI HEAD FINDINGS Brain: Negative for acute infarct. Chronic infarct with laminar necrosis right parietal lobe corresponding to the  hypodensity on CT. Mild associated hemorrhage in the right parietal infarct. Mild chronic microvascular ischemic change in the white matter. Mild generalized atrophy. Negative for mass or midline shift. Vascular: Normal arterial flow voids. Skull and upper cervical spine: Negative Sinuses/Orbits: Mild mucosal edema paranasal sinuses.  Normal orbit Other: None MRA HEAD FINDINGS Both vertebral arteries are patent to the basilar without stenosis. PICA patent bilaterally. Basilar widely patent. Superior cerebellar arteries are patent bilaterally. Fetal origin of the posterior cerebral artery bilaterally with hypoplastic P1 segments bilaterally. Internal carotid artery widely patent bilaterally. Anterior and middle cerebral arteries patent bilaterally without stenosis. Negative for cerebral aneurysm. IMPRESSION: Negative for acute infarct Chronic infarct right parietal lobe. Mild chronic microvascular ischemic change in the white matter Negative MRA head Electronically Signed   By: Franchot Gallo M.D.   On: 06/08/2018 10:59   US Renal  Result Date: 06/13/2018 CLINICAL DATA:  Initial evaluation for hematuria. EXAM: RENAL / URINARY TRACT ULTRASOUND COMPLETE COMPARISON:  Prior CT from 03/08/2017. FINDINGS: Right Kidney: Length: 10.2 cm. Echogenicity within normal limits. No hydronephrosis. 2.3 x 2.3 x 2.2 cm exophytic simple cyst at the upper pole. 0.9 x 0.7 x 0.8 cm simple cyst at the lower pole. Left Kidney: Length: 11.2 cm. Echogenicity within normal limits. No mass or  hydronephrosis visualized. Bladder: Appears normal for degree of bladder distention. Mildly enlarged prostate. IMPRESSION: 1. No hydronephrosis or other acute abnormality. No findings to explain hematuria. 2. Simple right renal cysts as above. 3. Enlarged prostate. Electronically Signed   By: Jeannine Boga M.D.   On: 06/13/2018 06:31   Dg Chest Port 1 View  Result Date: 06/10/2018 CLINICAL DATA:  Visual hallucination EXAM: PORTABLE CHEST 1 VIEW COMPARISON:  Chest x-rays dated 06/07/2018 and 07/26/2015. FINDINGS: Heart size is upper normal, stable. Mediastinal width is slightly more prominent on today's study, likely related to patient's semi-erect positioning. Lungs are clear. No pleural effusion or pneumothorax seen. Osseous structures about the chest are unremarkable. IMPRESSION: 1. No active disease.  No evidence of pneumonia or pulmonary edema. 2. Mediastinal width is slightly more prominent on today's study, but this is most likely related to patient's semi-erect positioning in the absence of chest pain. Electronically Signed   By: Franki Cabot M.D.   On: 06/10/2018 13:09   Dg Chest Port 1 View  Result Date: 06/07/2018 CLINICAL DATA:  Seizure today EXAM: PORTABLE CHEST 1 VIEW COMPARISON:  July 26, 2015 FINDINGS: The heart size and mediastinal contours are within normal limits. Aorta is tortuous both lungs are clear. The visualized skeletal structures are stable. IMPRESSION: No active cardiopulmonary disease. Electronically Signed   By: Abelardo Diesel M.D.   On: 06/07/2018 16:56    Lab Data:  CBC: Recent Labs  Lab 06/07/18 1645 06/07/18 1655 06/13/18 0500  WBC 5.9  --  6.8  NEUTROABS 4.5  --  4.7  HGB 12.8* 14.3 9.3*  HCT 42.0 42.0 28.7*  MCV 101.0*  --  96.3  PLT 164  --  696   Basic Metabolic Panel: Recent Labs  Lab 06/09/18 0459 06/10/18 0951 06/11/18 1729 06/13/18 0500 06/13/18 1053  NA 140 138 139 139 140  K 3.9 4.5 4.1 5.9* 4.0  CL 113* 114* 113* 112* 115*    CO2 16* 15* 17* 17* 19*  GLUCOSE 77 74 123* 93 105*  BUN 11 8 11 9 9   CREATININE 2.01* 1.58* 1.64* 1.61* 1.64*  CALCIUM 7.6* 7.7* 8.0* 8.2* 8.0*  MG  --  1.2* 1.7  1.7  --   PHOS  --  2.4* 2.8 2.5  --    GFR: Estimated Creatinine Clearance: 41.7 mL/min (A) (by C-G formula based on SCr of 1.64 mg/dL (H)). Liver Function Tests: Recent Labs  Lab 06/07/18 1645 06/10/18 0951 06/11/18 1729 06/13/18 0500  AST 44*  --   --   --   ALT 11  --   --   --   ALKPHOS 92  --   --   --   BILITOT 1.0  --   --   --   PROT 7.1  --   --   --   ALBUMIN 3.2* 2.6* 2.5* 2.3*   No results for input(s): LIPASE, AMYLASE in the last 168 hours. No results for input(s): AMMONIA in the last 168 hours. Coagulation Profile: No results for input(s): INR, PROTIME in the last 168 hours. Cardiac Enzymes: Recent Labs  Lab 06/07/18 1645  CKTOTAL 188   BNP (last 3 results) No results for input(s): PROBNP in the last 8760 hours. HbA1C: No results for input(s): HGBA1C in the last 72 hours. CBG: Recent Labs  Lab 06/07/18 1621  GLUCAP 120*   Lipid Profile: No results for input(s): CHOL, HDL, LDLCALC, TRIG, CHOLHDL, LDLDIRECT in the last 72 hours. Thyroid Function Tests: No results for input(s): TSH, T4TOTAL, FREET4, T3FREE, THYROIDAB in the last 72 hours. Anemia Panel: No results for input(s): VITAMINB12, FOLATE, FERRITIN, TIBC, IRON, RETICCTPCT in the last 72 hours. Urine analysis:    Component Value Date/Time   COLORURINE STRAW (A) 06/13/2018 1439   APPEARANCEUR CLEAR 06/13/2018 1439   LABSPEC 1.003 (L) 06/13/2018 1439   PHURINE 6.0 06/13/2018 1439   GLUCOSEU NEGATIVE 06/13/2018 1439   HGBUR MODERATE (A) 06/13/2018 1439   BILIRUBINUR NEGATIVE 06/13/2018 1439   KETONESUR NEGATIVE 06/13/2018 1439   PROTEINUR 30 (A) 06/13/2018 1439   UROBILINOGEN 1.0 07/19/2015 1739   NITRITE NEGATIVE 06/13/2018 1439   LEUKOCYTESUR NEGATIVE 06/13/2018 1439     Bonnell Public M.D. [facility] 06/13/2018, 7:03 PM  Between 7am to 7pm - call Pager - [phone].  After 7pm go to [email or web address] - password [email or web address]  Call night coverage person covering after 7pm

## 2018-06-13 NOTE — Progress Notes (Signed)
Dr. Marthenia Rolling notified via text about patient's complaint of neck and leg pain.

## 2018-06-13 NOTE — Progress Notes (Signed)
Patient complaint of neck and Left knee and leg pain when Nurse tech reposition him on his side.  Will continue to monitor patient.

## 2018-06-13 NOTE — Progress Notes (Signed)
  Speech Language Pathology Treatment: Cognitive-Linquistic  Patient Details Name: Mitchell [name] MRN: [id number] DOB: 02-09-1951 Today's Date: 06/13/2018 Time: 1400-1430 SLP Time Calculation (min) (ACUTE ONLY): 30 min  Assessment / Plan / Recommendation Clinical Impression  Pt seen at bedside for continued treatment for cognitive linguistic deficits identified on evaluation (12/30 on [facility] 06/08/18). Pt's wife was at bedside, and indicated he was awake when lunch arrived, but had fallen asleep waiting for someone to reposition him. SLP called for assistance and RNs arrived shortly. After cleaning pt, RN assisted SLP in repositioning pt upright.   SLP then attempted to provide pt with several bites of lunch, which he refused. Pt kept eyes closed throughout, arms tight. Pt was nonvocal, and did not follow commands. Wife indicates pt is irritable when he is awakened, and that pt does not sleep at night. SLP used cold wash cloth, verbal and tactile stimulation to encourage participation. Of note, pt's arms were tight, resisting having gown changed, not flaccid as if sleeping. He kept his eyes closed for the entire session, tightening his lips when any po trials were presented. SLP left to get ensure and ice cream, which his wife indicated he would eat, however, pt response was the same.     HPI HPI: 67 yo male adm to [facility] with AMS, seizure.  Pt with PMH + for cva 06/2015 with dysphagia requiring feeding tube- removed 3/17. Pt found to have high right parietal hypodensity - ? acute to subacute cva.  Pt ambulates using walker per his statement and he spent time in a rehab after prior cva.  CXR negative. Speech evaluation ordered as part of stroke work up.        SLP Plan  Continue with current plan of care          Oral Care Recommendations: Oral care BID;Staff/trained caregiver to provide oral care Follow up Recommendations: 24 hour supervision/assistance SLP Visit Diagnosis: Attention and  concentration deficit;Frontal lobe and executive function deficit Attention and concentration deficit following: Cerebral infarction Frontal lobe and executive function deficit following: Cerebral infarction Plan: Continue with current plan of care       GO               Celia B. Quentin [facility], [place] Speech Language Pathologist [phone]  Shonna Chock 06/13/2018, 2:33 PM

## 2018-06-14 ENCOUNTER — Other Ambulatory Visit: Payer: Self-pay

## 2018-06-14 DIAGNOSIS — E7800 Pure hypercholesterolemia, unspecified: Secondary | ICD-10-CM

## 2018-06-14 MED ORDER — AMLODIPINE BESYLATE 5 MG PO TABS: 5.0000 mg | ORAL_TABLET | Freq: Every day | ORAL | Status: DC

## 2018-06-14 MED ORDER — QUETIAPINE FUMARATE 25 MG PO TABS: 12.5000 mg | ORAL_TABLET | Freq: Two times a day (BID) | ORAL | Status: DC

## 2018-06-14 MED ORDER — LEVETIRACETAM 500 MG PO TABS: 500.0000 mg | ORAL_TABLET | Freq: Two times a day (BID) | ORAL | Status: DC

## 2018-06-14 MED ORDER — CARVEDILOL 12.5 MG PO TABS: 12.5000 mg | ORAL_TABLET | Freq: Two times a day (BID) | ORAL | Status: DC

## 2018-06-14 MED ORDER — ATORVASTATIN CALCIUM 40 MG PO TABS: 40.0000 mg | ORAL_TABLET | Freq: Every day | ORAL | Status: DC

## 2018-06-14 MED ORDER — THIAMINE HCL 100 MG PO TABS: 100.0000 mg | ORAL_TABLET | Freq: Every day | ORAL | 0 refills | Status: DC

## 2018-06-14 NOTE — Clinical Social Work Placement (Signed)
   CLINICAL SOCIAL WORK PLACEMENT  NOTE  Date:  06/14/2018  Patient Details  Name: Mitchell [name] MRN: [id number] Date of Birth: [birth year]  Clinical Social Work is seeking post-discharge placement for this patient at the [place] level of care (*CSW will initial, date and re-position this form in  chart as items are completed):      Patient/family provided with [place] Work Department's list of facilities offering this level of care within the geographic area requested by the patient (or if unable, by the patient's family).      Patient/family informed of their freedom to choose among providers that offer the needed level of care, that participate in Medicare, Medicaid or managed care program needed by the patient, have an available bed and are willing to accept the patient.      Patient/family informed of [facility]'s ownership interest in [facility] and [facility], as well as of the fact that they are under no obligation to receive care at these facilities.  PASRR submitted to EDS on 06/11/18     PASRR number received on       Existing PASRR number confirmed on 06/11/18     FL2 transmitted to all facilities in geographic area requested by pt/family on 06/11/18     FL2 transmitted to all facilities within larger geographic area on       Patient informed that his/her managed care company has contracts with or will negotiate with certain facilities, including the following:        Yes   Patient/family informed of bed offers received.  Patient chooses bed at [facility] recommends and patient chooses bed at      Patient to be transferred to [facility] on 06/14/18.  Patient to be transferred to facility by [facility]     Patient family notified on 06/14/18 of transfer.  Name of family member notified:  Vernia Buff     PHYSICIAN Please prepare prescriptions     Additional Comment:     _______________________________________________ Candie Chroman, LCSW 06/14/2018, 3:01 PM

## 2018-06-14 NOTE — Progress Notes (Signed)
Physical Therapy Treatment Patient Details Name: Mitchell [name] MRN: [id number] DOB: 1951-05-20 Today's Date: 06/14/2018    History of Present Illness Pt is a 67 y.o. 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 Workup for seizure vs CVA. 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     PT Comments    Patient seen for activity progression. Initially lethargic, assisted with PROM supine then progressed to EOB. Patient improved arousal noted at EOB. Nursing provided meds during session to assist with pain control. Patient did not attempt transfers or gait this session. Current POC remains appropriate.   Follow Up Recommendations  SNF;Supervision/Assistance - 24 hour(TBD)     Equipment Recommendations  Other (comment)    Recommendations for Other Services       Precautions / Restrictions Precautions Precautions: Fall Precaution Comments: agitated at times Restrictions Weight Bearing Restrictions: No    Mobility  Bed Mobility Overal bed mobility: Needs Assistance Bed Mobility: Supine to Sit;Sit to Supine     Supine to sit: Mod assist Sit to supine: Max assist   General bed mobility comments: Moderate assist to elevate trunk and rotate hips to EOB, max assist to return to bed due to cognitive difficulties  Transfers Overall transfer level: Needs assistance               General transfer comment: patient not willing to attempt due to pain in Left knee  Ambulation/Gait                 Stairs             Wheelchair Mobility    Modified Rankin (Stroke Patients Only)       Balance Overall balance assessment: Needs assistance Sitting-balance support: No upper extremity supported;Feet  supported Sitting balance-Leahy Scale: Fair                                      Cognition Arousal/Alertness: Awake/alert;Lethargic(initially lethargic, but aroused with activity) Behavior During Therapy: Restless Overall Cognitive Status: Impaired/Different from baseline Area of Impairment: Following commands;Safety/judgement;Awareness;Problem solving                       Following Commands: Follows one step commands with increased time;Follows multi-step commands inconsistently Safety/Judgement: Decreased awareness of safety;Decreased awareness of deficits Awareness: Emergent Problem Solving: Slow processing;Decreased initiation;Requires verbal cues        Exercises      General Comments        Pertinent Vitals/Pain Pain Assessment: Faces Faces Pain Scale: Hurts even more Pain Location: grimacing with movement Pain Descriptors / Indicators: Grimacing;Guarding Pain Intervention(s): Monitored during session;RN gave pain meds during session    Home Living                      Prior Function            PT Goals (current goals can now be found in the care plan section) Acute Rehab PT Goals Patient Stated Goal: none stated PT Goal Formulation: Patient unable to participate in goal setting Time For Goal Achievement: 06/25/18 Potential to Achieve Goals: Fair Progress towards PT goals: Not progressing toward  goals - comment(self limiting due to pain)    Frequency    Min 2X/week      PT Plan Current plan remains appropriate    Co-evaluation              AM-PAC PT "6 Clicks" Daily Activity  Outcome Measure  Difficulty turning over in bed (including adjusting bedclothes, sheets and blankets)?: Unable Difficulty moving from lying on back to sitting on the side of the bed? : Unable Difficulty sitting down on and standing up from a chair with arms (e.g., wheelchair, bedside commode, etc,.)?: Unable Help needed moving to and  from a bed to chair (including a wheelchair)?: A Lot Help needed walking in hospital room?: A Lot Help needed climbing 3-5 steps with a railing? : Total 6 Click Score: 8    End of Session   Activity Tolerance: Patient limited by pain;Other (comment)(and confusion) Patient left: in bed;with call bell/phone within reach;with bed alarm set;Other (comment)(other nurses in room ) Nurse Communication: Mobility status PT Visit Diagnosis: Ataxic gait (R26.0);Other symptoms and signs involving the nervous system (R29.898);Pain     Time: 2878-6767 PT Time Calculation (min) (ACUTE ONLY): 18 min  Charges:  $Therapeutic Activity: 8-22 mins                     Mitchell [name], PT DPT  Board Certified Neurologic Specialist [place] Pager [phone] Office [phone]    Mitchell [name] 06/14/2018, 11:41 AM

## 2018-06-14 NOTE — Progress Notes (Signed)
Pt discharge education completed. Pt IV removed; condom cath remains intact upon family request. Pt discharge to [facility]. Report called to nurse Minette Brine at they facility. Pt awaiting on [facility] to transport off to disposition. Delia Heady RN

## 2018-06-14 NOTE — Clinical Social Work Note (Signed)
CSW facilitated patient discharge including contacting patient family and facility to confirm patient discharge plans. Clinical information faxed to facility and family agreeable with plan. CSW arranged ambulance transport via [facility] to [facility]. RN to call report prior to discharge [phone] Room 219).  CSW will sign off for now as social work intervention is no longer needed. Please consult [place] again if new needs arise.  [place], [place]

## 2018-06-14 NOTE — Discharge Summary (Signed)
Physician Discharge Summary  Dartanian Knaggs VQM:[id number] DOB: Oct 29, 1950 DOA: 06/07/2018  PCP: [name], MD  Admit date: 06/07/2018 Discharge date: 06/14/2018  Admitted From: Home Disposition: SNF   Recommendations for Outpatient Follow-up:  1. Follow up with PCP in 1-2 weeks 2. Please obtain BMP/CBC in one week 3. Repeat UA and refer to urology if hematuria persists.  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 EMS was called. En route, patient had seizure and was given 2.5 mg of Versed.  MRI of the brain was negative for acute infarct, which revealed chronic infarct right parietal lobe, mild chronic microvascular ischemic change in the white matter.  MRA head was negative.He was started on AED and neurology consulted. He's also suffered from acute delirium improved with seroquel. Per prior notes, patient was noted on one occasion to be smearing feces on the wall. Physical therapy has recommended skilled nursing facility placement.  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  - Continue low dose seroquel which has improved symptoms. Also anticipate some improvement once acuity of care is deescalated.  - Continue AEDs and high  dose thiamine.   Hematuria:Prior urinalysis revealed hematuria, but renal ultrasound is nonrevealing.  - Urology follow up recommended if hematuria persists.   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  CT head showed hypodensity in the high right parietal lobe consistent with acute to subacute infarct, MRI confirmed a CHRONIC infarct.  - Continue aspirin, xarelto, statin - LDL 62. Started statin. A1c 5.2  Seizure: Possibly due to alcohol versus CVA. EEG did not capture seizure. - Continue keppra per neurology for now. Will need neuro follow up as outpatient.   Acute kidney injury on chronic kidney disease stage III: Cr 2.5 at admission, improved to 1.64 with IVF. No hydro on U/S. Had isolated hyperkalemia during admission which resolved (?hemolysis) - Monitor BMP at follow up. Renally dose medications as needed.   Lactic acidosis: Likely due to seizures, lactic acid 13.0 at the time of admission, CK normal, resolved.   Low grade fever: Possibly due to gout flare. Knee exam is not consistent with septic joint. No leukocytosis, pyuria, or pulmonary infiltrate. - Monitoring off antibiotics for now.   Anemia: Hgb 9.3, normocytic. No bleeding reported. Suspect AOCD.  - Monitor blood counts.    Hyperlipidemia - LDL 62, continue Lipitor  Gout with left knee flare:  - Continue uloric (outpatient medication), colchicine  HTN:  - Added norvasc, changed metoprolol to coreg - Monitor blood pressure closely, and adjust medication accordingly.   Chronic systolic CHF: - Currently compensated. Change beta blocker to coreg, continue lasix, No ACE/ARB with CKD.  Alcohol abuse - Patient counseled on alcohol cessation.   Cannabis abuse:  - Patient counseled.  Prolonged QTc interval: - Continue to monitor. Continue to  optimize electrolytes and magnesium.   Discharge Instructions Discharge Instructions    Diet - low sodium heart  healthy   Complete by:  As directed    Increase activity slowly   Complete by:  As directed      Allergies as of 06/14/2018      Reactions   Hydrochlorothiazide    REACTION: precipitates gout   Pseudoephedrine    REACTION: rash      Medication List    STOP taking these medications   metoprolol tartrate 50 MG tablet Commonly known as:  LOPRESSOR     TAKE these medications   acetaminophen 325 MG tablet Commonly known as:  TYLENOL Take 2 tablets (650 mg total) by mouth every 6 (six) hours as needed for mild pain, moderate pain or fever (or Fever >/= 101).   amLODipine 5 MG tablet Commonly known as:  NORVASC Take 1 tablet (5 mg total) by mouth daily. Start taking on:  06/15/2018   atorvastatin 40 MG tablet Commonly known as:  LIPITOR Take 1 tablet (40 mg total) by mouth daily at 6 PM.   carvedilol 12.5 MG tablet Commonly known as:  COREG Take 1 tablet (12.5 mg total) by mouth 2 (two) times daily with a meal.   COLCRYS 0.6 MG tablet Generic drug:  colchicine Take 0.6 mg by mouth 2 (two) times a week.   folic acid 1 MG tablet Commonly known as:  FOLVITE Take 1 tablet (1 mg total) by mouth daily.   furosemide 20 MG tablet Commonly known as:  LASIX Take 20 mg by mouth daily.   levETIRAcetam 500 MG tablet Commonly known as:  KEPPRA Take 1 tablet (500 mg total) by mouth 2 (two) times daily.   multivitamin Tabs tablet Take 1 tablet by mouth daily.   pantoprazole 40 MG tablet Commonly known as:  PROTONIX Take 1 tablet (40 mg total) by mouth daily.   potassium chloride SA 15 MEQ tablet Commonly known as:  KLOR-CON M15 Take 2 tablets (30 mEq total) by mouth daily.   QUEtiapine 25 MG tablet Commonly known as:  SEROQUEL Take 0.5 tablets (12.5 mg total) by mouth 2 (two) times daily.   rivaroxaban 20 MG Tabs tablet Commonly known as:  XARELTO Take 1 tablet (20 mg total) by mouth daily with supper. What changed:  when to take this   thiamine 100 MG tablet Take 1  tablet (100 mg total) by mouth daily.   ULORIC 80 MG Tabs Generic drug:  Febuxostat Take 1 tablet by mouth daily.      Contact information for after-discharge care    Destination    [facility] .   Service:  Skilled Nursing Contact information: [address] [address] [phone]             Allergies  Allergen Reactions  . Hydrochlorothiazide     REACTION: precipitates gout  . Pseudoephedrine     REACTION: rash    Consultations:  Neurology  Procedures/Studies: Ct Head Wo Contrast  Result Date: 06/07/2018 CLINICAL DATA:  Altered LOC, witnessed seizure EXAM: CT HEAD WITHOUT CONTRAST CT CERVICAL SPINE WITHOUT CONTRAST TECHNIQUE: Multidetector CT imaging of the head and cervical spine was performed following the standard protocol without intravenous contrast. Multiplanar CT image reconstructions of the cervical spine were also generated. COMPARISON:  MRI 07/17/2015, CT brain 07/08/2015 FINDINGS: CT HEAD FINDINGS Brain: Hypodensity with loss of the gray-white matter differentiation in the right high parietal lobe consistent  with acute to subacute infarct. No hemorrhage. No mass. Mild atrophy. Stable ventricle size. Vascular: No hyperdense vessels.  Carotid vascular calcification Skull: Normal. Negative for fracture or focal lesion. Sinuses/Orbits: No acute finding. Mucous retention cyst in the left maxillary sinus. Other: None CT CERVICAL SPINE FINDINGS Alignment: Motion degraded study which limits evaluation for fracture. Facet alignment within normal limits. Skull base and vertebrae: Craniovertebral junction is intact. No obvious fracture allowing for motion Soft tissues and spinal canal: No prevertebral fluid or swelling. No visible canal hematoma. Disc levels: Moderate degenerative change at C3-C4, C5-C6 and C6-C7. Upper chest: Negative. Other: None IMPRESSION: 1. Hypodensity within the high right parietal lobe, consistent  with acute to subacute infarct. No hemorrhage. Atrophy. 2. Motion degraded images of the cervical spine which limits evaluation for fracture. No definite acute osseous abnormality is seen. Electronically Signed   By: Donavan Foil M.D.   On: 06/07/2018 19:41   Ct Cervical Spine Wo Contrast  Result Date: 06/07/2018 CLINICAL DATA:  Altered LOC, witnessed seizure EXAM: CT HEAD WITHOUT CONTRAST CT CERVICAL SPINE WITHOUT CONTRAST TECHNIQUE: Multidetector CT imaging of the head and cervical spine was performed following the standard protocol without intravenous contrast. Multiplanar CT image reconstructions of the cervical spine were also generated. COMPARISON:  MRI 07/17/2015, CT brain 07/08/2015 FINDINGS: CT HEAD FINDINGS Brain: Hypodensity with loss of the gray-white matter differentiation in the right high parietal lobe consistent with acute to subacute infarct. No hemorrhage. No mass. Mild atrophy. Stable ventricle size. Vascular: No hyperdense vessels.  Carotid vascular calcification Skull: Normal. Negative for fracture or focal lesion. Sinuses/Orbits: No acute finding. Mucous retention cyst in the left maxillary sinus. Other: None CT CERVICAL SPINE FINDINGS Alignment: Motion degraded study which limits evaluation for fracture. Facet alignment within normal limits. Skull base and vertebrae: Craniovertebral junction is intact. No obvious fracture allowing for motion Soft tissues and spinal canal: No prevertebral fluid or swelling. No visible canal hematoma. Disc levels: Moderate degenerative change at C3-C4, C5-C6 and C6-C7. Upper chest: Negative. Other: None IMPRESSION: 1. Hypodensity within the high right parietal lobe, consistent with acute to subacute infarct. No hemorrhage. Atrophy. 2. Motion degraded images of the cervical spine which limits evaluation for fracture. No definite acute osseous abnormality is seen. Electronically Signed   By: Donavan Foil M.D.   On: 06/07/2018 19:41   Mr Jodene Nam Head Wo  Contrast  Result Date: 06/08/2018 CLINICAL DATA:  Stroke.  Seizure.  Found unresponsive. EXAM: MRI HEAD WITHOUT CONTRAST MRA HEAD WITHOUT CONTRAST TECHNIQUE: Multiplanar, multiecho pulse sequences of the brain and surrounding structures were obtained without intravenous contrast. Angiographic images of the head were obtained using MRA technique without contrast. COMPARISON:  CT head 06/07/2018 FINDINGS: MRI HEAD FINDINGS Brain: Negative for acute infarct. Chronic infarct with laminar necrosis right parietal lobe corresponding to the hypodensity on CT. Mild associated hemorrhage in the right parietal infarct. Mild chronic microvascular ischemic change in the white matter. Mild generalized atrophy. Negative for mass or midline shift. Vascular: Normal arterial flow voids. Skull and upper cervical spine: Negative Sinuses/Orbits: Mild mucosal edema paranasal sinuses.  Normal orbit Other: None MRA HEAD FINDINGS Both vertebral arteries are patent to the basilar without stenosis. PICA patent bilaterally. Basilar widely patent. Superior cerebellar arteries are patent bilaterally. Fetal origin of the posterior cerebral artery bilaterally with hypoplastic P1 segments bilaterally. Internal carotid artery widely patent bilaterally. Anterior and middle cerebral arteries patent bilaterally without stenosis. Negative for cerebral aneurysm. IMPRESSION: Negative for acute infarct Chronic infarct right parietal  lobe. Mild chronic microvascular ischemic change in the white matter Negative MRA head Electronically Signed   By: Franchot Gallo M.D.   On: 06/08/2018 10:59   Mr Brain Wo Contrast  Result Date: 06/08/2018 CLINICAL DATA:  Stroke.  Seizure.  Found unresponsive. EXAM: MRI HEAD WITHOUT CONTRAST MRA HEAD WITHOUT CONTRAST TECHNIQUE: Multiplanar, multiecho pulse sequences of the brain and surrounding structures were obtained without intravenous contrast. Angiographic images of the head were obtained using MRA technique without  contrast. COMPARISON:  CT head 06/07/2018 FINDINGS: MRI HEAD FINDINGS Brain: Negative for acute infarct. Chronic infarct with laminar necrosis right parietal lobe corresponding to the hypodensity on CT. Mild associated hemorrhage in the right parietal infarct. Mild chronic microvascular ischemic change in the white matter. Mild generalized atrophy. Negative for mass or midline shift. Vascular: Normal arterial flow voids. Skull and upper cervical spine: Negative Sinuses/Orbits: Mild mucosal edema paranasal sinuses.  Normal orbit Other: None MRA HEAD FINDINGS Both vertebral arteries are patent to the basilar without stenosis. PICA patent bilaterally. Basilar widely patent. Superior cerebellar arteries are patent bilaterally. Fetal origin of the posterior cerebral artery bilaterally with hypoplastic P1 segments bilaterally. Internal carotid artery widely patent bilaterally. Anterior and middle cerebral arteries patent bilaterally without stenosis. Negative for cerebral aneurysm. IMPRESSION: Negative for acute infarct Chronic infarct right parietal lobe. Mild chronic microvascular ischemic change in the white matter Negative MRA head Electronically Signed   By: Franchot Gallo M.D.   On: 06/08/2018 10:59   US Renal  Result Date: 06/13/2018 CLINICAL DATA:  Initial evaluation for hematuria. EXAM: RENAL / URINARY TRACT ULTRASOUND COMPLETE COMPARISON:  Prior CT from 03/08/2017. FINDINGS: Right Kidney: Length: 10.2 cm. Echogenicity within normal limits. No hydronephrosis. 2.3 x 2.3 x 2.2 cm exophytic simple cyst at the upper pole. 0.9 x 0.7 x 0.8 cm simple cyst at the lower pole. Left Kidney: Length: 11.2 cm. Echogenicity within normal limits. No mass or hydronephrosis visualized. Bladder: Appears normal for degree of bladder distention. Mildly enlarged prostate. IMPRESSION: 1. No hydronephrosis or other acute abnormality. No findings to explain hematuria. 2. Simple right renal cysts as above. 3. Enlarged prostate.  Electronically Signed   By: Jeannine Boga M.D.   On: 06/13/2018 06:31   Dg Chest Port 1 View  Result Date: 06/10/2018 CLINICAL DATA:  Visual hallucination EXAM: PORTABLE CHEST 1 VIEW COMPARISON:  Chest x-rays dated 06/07/2018 and 07/26/2015. FINDINGS: Heart size is upper normal, stable. Mediastinal width is slightly more prominent on today's study, likely related to patient's semi-erect positioning. Lungs are clear. No pleural effusion or pneumothorax seen. Osseous structures about the chest are unremarkable. IMPRESSION: 1. No active disease.  No evidence of pneumonia or pulmonary edema. 2. Mediastinal width is slightly more prominent on today's study, but this is most likely related to patient's semi-erect positioning in the absence of chest pain. Electronically Signed   By: Franki Cabot M.D.   On: 06/10/2018 13:09   Dg Chest Port 1 View  Result Date: 06/07/2018 CLINICAL DATA:  Seizure today EXAM: PORTABLE CHEST 1 VIEW COMPARISON:  July 26, 2015 FINDINGS: The heart size and mediastinal contours are within normal limits. Aorta is tortuous both lungs are clear. The visualized skeletal structures are stable. IMPRESSION: No active cardiopulmonary disease. Electronically Signed   By: Abelardo Diesel M.D.   On: 06/07/2018 16:56   Subjective: Pt responds verbally but will not open eyes on command. Not willing to take medications this morning. Wife at bedside says he gets oppositional at times. No  new neurological deficits. Has some pain in the left knee which is typical for gout flare for him.   Discharge Exam:  06/14/18 0754  BP: (!) 163/85  Pulse: 93  Resp: 20  Temp: 99.6 F (37.6 C)  SpO2: 100%   General: No distress. Cardiovascular: RRR, S1/S2 +, no rubs, no gallops Respiratory: Nonlabored and clear Abdominal: Soft, NT, ND, bowel sounds + Extremities: No edema, no cyanosis. Left knee without erythema. +Effusion not significantly tender. Pt denies pain when palpating but will not  perform ROM.  Neuro: Alert, eyes closed, will not answer orientation questions or cooperate with neuro exam.   Labs: BNP (last 3 results) No results for input(s): BNP in the last 8760 hours. Basic Metabolic Panel: Recent Labs  Lab 06/09/18 0459 06/10/18 0951 06/11/18 1729 06/13/18 0500 06/13/18 1053  NA 140 138 139 139 140  K 3.9 4.5 4.1 5.9* 4.0  CL 113* 114* 113* 112* 115*  CO2 16* 15* 17* 17* 19*  GLUCOSE 77 74 123* 93 105*  BUN 11 8 11 9 9   CREATININE 2.01* 1.58* 1.64* 1.61* 1.64*  CALCIUM 7.6* 7.7* 8.0* 8.2* 8.0*  MG  --  1.2* 1.7 1.7  --   PHOS  --  2.4* 2.8 2.5  --    Liver Function Tests: Recent Labs  Lab 06/07/18 1645 06/10/18 0951 06/11/18 1729 06/13/18 0500  AST 44*  --   --   --   ALT 11  --   --   --   ALKPHOS 92  --   --   --   BILITOT 1.0  --   --   --   PROT 7.1  --   --   --   ALBUMIN 3.2* 2.6* 2.5* 2.3*   No results for input(s): LIPASE, AMYLASE in the last 168 hours. No results for input(s): AMMONIA in the last 168 hours. CBC: Recent Labs  Lab 06/07/18 1645 06/07/18 1655 06/13/18 0500  WBC 5.9  --  6.8  NEUTROABS 4.5  --  4.7  HGB 12.8* 14.3 9.3*  HCT 42.0 42.0 28.7*  MCV 101.0*  --  96.3  PLT 164  --  160   Cardiac Enzymes: Recent Labs  Lab 06/07/18 1645  CKTOTAL 188   BNP: Invalid input(s): POCBNP CBG: Recent Labs  Lab 06/07/18 1621  GLUCAP 120*   D-Dimer No results for input(s): DDIMER in the last 72 hours. Hgb A1c No results for input(s): HGBA1C in the last 72 hours. Lipid Profile No results for input(s): CHOL, HDL, LDLCALC, TRIG, CHOLHDL, LDLDIRECT in the last 72 hours. Thyroid function studies No results for input(s): TSH, T4TOTAL, T3FREE, THYROIDAB in the last 72 hours.  Invalid input(s): FREET3 Anemia work up No results for input(s): VITAMINB12, FOLATE, FERRITIN, TIBC, IRON, RETICCTPCT in the last 72 hours. Urinalysis    Component Value Date/Time   COLORURINE STRAW (A) 06/13/2018 1439   APPEARANCEUR CLEAR  06/13/2018 1439   LABSPEC 1.003 (L) 06/13/2018 1439   PHURINE 6.0 06/13/2018 1439   GLUCOSEU NEGATIVE 06/13/2018 1439   HGBUR MODERATE (A) 06/13/2018 1439   BILIRUBINUR NEGATIVE 06/13/2018 1439   KETONESUR NEGATIVE 06/13/2018 1439   PROTEINUR 30 (A) 06/13/2018 1439   UROBILINOGEN 1.0 07/19/2015 1739   NITRITE NEGATIVE 06/13/2018 1439   LEUKOCYTESUR NEGATIVE 06/13/2018 1439    Microbiology Recent Results (from the past 240 hour(s))  Blood Culture (routine x 2)     Status: None   Collection Time: 06/07/18  5:30 PM  Result  Value Ref Range Status   Specimen Description BLOOD RIGHT ANTECUBITAL  Final   Special Requests   Final    BOTTLES DRAWN AEROBIC AND ANAEROBIC Blood Culture adequate volume   Culture   Final    NO GROWTH 5 DAYS Performed at [facility], [address] [place], [place] [address]    Report Status 06/12/2018 FINAL  Final  Blood Culture (routine x 2)     Status: None   Collection Time: 06/07/18  5:30 PM  Result Value Ref Range Status   Specimen Description BLOOD RIGHT FOREARM  Final   Special Requests   Final    BOTTLES DRAWN AEROBIC AND ANAEROBIC Blood Culture adequate volume   Culture   Final    NO GROWTH 5 DAYS Performed at [facility], [place] [address], [place], [place] [address]    Report Status 06/12/2018 FINAL  Final  Culture, Urine     Status: None   Collection Time: 06/10/18 10:36 AM  Result Value Ref Range Status   Specimen Description URINE, CLEAN CATCH  Final   Special Requests NONE  Final   Culture   Final    NO GROWTH Performed at [facility], [place] [address] [place], [place] [address]    Report Status 06/11/2018 FINAL  Final    Time coordinating discharge: Approximately 40 minutes  Patrecia Pour, MD  [facility] 06/14/2018, 2:28 PM Pager [phone]

## 2018-06-15 ENCOUNTER — Encounter: Payer: Self-pay | Admitting: Internal Medicine

## 2018-06-15 ENCOUNTER — Non-Acute Institutional Stay (SKILLED_NURSING_FACILITY): Payer: Medicare Other | Admitting: Internal Medicine

## 2018-06-15 DIAGNOSIS — G40909 Epilepsy, unspecified, not intractable, without status epilepticus: Secondary | ICD-10-CM | POA: Insufficient documentation

## 2018-06-15 DIAGNOSIS — Z87898 Personal history of other specified conditions: Secondary | ICD-10-CM

## 2018-06-15 DIAGNOSIS — M109 Gout, unspecified: Secondary | ICD-10-CM

## 2018-06-15 DIAGNOSIS — R9431 Abnormal electrocardiogram [ECG] [EKG]: Secondary | ICD-10-CM | POA: Diagnosis not present

## 2018-06-15 DIAGNOSIS — F1911 Other psychoactive substance abuse, in remission: Secondary | ICD-10-CM | POA: Insufficient documentation

## 2018-06-15 DIAGNOSIS — G253 Myoclonus: Secondary | ICD-10-CM | POA: Insufficient documentation

## 2018-06-15 NOTE — Assessment & Plan Note (Addendum)
Neurology follow-up as outpatient Continue Keppra (Note: did not get am Keppra 9/19 as not yet received from [facility] as per Nurse. 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

## 2018-06-15 NOTE — Assessment & Plan Note (Addendum)
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  The Keppra can be increased every 2 weeks if there is suboptimal response.

## 2018-06-15 NOTE — Patient Instructions (Signed)
See assessment and plan under each diagnosis in the problem list and acutely for this visit 

## 2018-06-15 NOTE — Assessment & Plan Note (Addendum)
Last uric acid 9.4 on 07/15/2015 Update uric acid Decrease Uloric to 40 mg if GFR drops below 30

## 2018-06-15 NOTE — Progress Notes (Signed)
NURSING HOME LOCATION:  [facility] ROOM NUMBER:  [id number]  CODE STATUS:  Full Code  PCP:  [name], MD  [address] [place] [address]  This is a comprehensive admission note to [facility] performed on this date less than 30 days from date of admission.  Included are preadmission medical/surgical history; reconciled medication list; family history; social history and comprehensive review of systems.   Corrections and additions to the records were documented. Comprehensive physical exam was also performed. Additionally a clinical summary was entered for each active diagnosis pertinent to this admission in the Problem List to enhance continuity of care.  HPI: The patient was hospitalized 9/11-9/18/2019, admitted with seizure.  His wife found him in bed, unresponsive, trembling, and foaming at the mouth with eyes rolled back.  In route by EMS transport to the ED the patient had a seizure and was given 2.5 mg of Versed.  Lactic acidosis was 13, attributed to seizures.  CK was normal.  CT of the head revealed a hypodense lesion in the high right parietal lobe consistent with possible acute to subacute infarct.  MRI was negative for acute infarct.  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  MRA of the head was negative.  EEG did not document seizure activity.  Urine drug screen was positive for benzodiazepines and THC.  Alcohol level was less than 10.  Keppra was initiated.  AED was applied.  Aspirin, statin, and Xarelto were prescribed.   Hospitalization was complicated by acute delirium which responded to Seroquel.  Behavioral disturbance included smearing feces on the wall of his room.  Neurology questioned Korsakoff syndrome as etiology of the acute delirium.  Low-dose Seroquel was to be continued.  High-dose thiamine was prescribed. Urinalysis revealed hematuria, but ultrasound was  nondiagnostic.  Urology consultation was to be made if hematuria recurs or persists. Acute kidney injury was superimposed on chronic kidney disease stage III.  Creatinine was 2.5 at admission but improved to 1.64 with IV fluids. Low-grade fever was attributed to a gout flare in the knee.  Clinically there was no evidence of septic joint.  Uloric and colchicine were prescribed.  The last uric acid on record was 9.4 on 07/15/2015. Normocytic anemia was present with hemoglobin 9.3.  This was attributed to chronic disease.  Past medical and surgical history: Includes history of cardiac arrest, chronic combined diastolic/systolic heart failure, hypertension, dyslipidemia, history of pancreatitis, and substance abuse (tobacco, alcohol, and cannabis).  He has a history of tracheal perforation.  Social history: His wife states that he would drink a gallon of alcohol over two days.  She also collected dozens of empty mini bottles after his admission. In reference to UDS,she contacted his PCP who stated he had not prescribed benzodiazepines.  He was a current smoker prior to admission.  Family history: Reviewed, significant is a history of aneurysm in his mother and sister.  Brother had clotting disorder.   Review of systems:  He denies any active symptoms, but affect is bizarre (see PE)  Constitutional: No fever, significant weight change, fatigue  Eyes: No redness, discharge, pain, vision change ENT/mouth: No nasal congestion, purulent discharge, earache, change in hearing, sore throat  Cardiovascular: No chest pain, palpitations, paroxysmal nocturnal dyspnea, claudication, edema  Respiratory: No cough, sputum production, hemoptysis, DOE, significant snoring, apnea Gastrointestinal: No heartburn, dysphagia, abdominal pain, nausea /vomiting, rectal bleeding, melena, change in bowels Genitourinary:  No dysuria, hematuria, pyuria, incontinence, nocturia Musculoskeletal: No active joint stiffness, joint  swelling, weakness, pain Dermatologic: No rash, pruritus, change in appearance of skin Neurologic: No dizziness, headache, numbness, tingling Psychiatric: No significant anxiety, depression, insomnia, anorexia Endocrine: No change in hair/skin/nails, excessive thirst, excessive hunger, excessive urination  Hematologic/lymphatic: No significant bruising, lymphadenopathy, abnormal bleeding Allergy/immunology: No itchy/watery eyes, significant sneezing, urticaria, angioedema  Physical exam:  Pertinent or positive findings: He exhibits a bizarre affect.  At times he will look away as if in a trance;yet he is able to give me the date as September 16th (vs 19th), 2019 and was able to give his wedding anniversary date correctly as verified by his wife.  He also follows commands poorly and incompletely.  He has a beard and mustache.  Ptosis and arcus senilis are present.  Complete dentures are present.  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  This is more noticeable over the abdomen.  This constant jerking of the torso made auscultation of the heart very difficult.  When there was a brief respite in the jerking; clinically the heart rhythm and rate appeared to be normal.  He has 1+ pitting edema.  Pedal pulses are palpable.  There are fusiform changes in the knee.  He has marked interosseous wasting of the hands.  There is medial deviation of the fingers especially on the right.  There is enlargement of the right lateral wrist greater than the left.Well healed PEG site L abdomen.  General appearance: Adequately nourished; no acute distress, increased work of breathing is present.   Lymphatic: No lymphadenopathy about the head, neck, axilla. Eyes: No conjunctival inflammation or lid edema is present. There is no scleral icterus. Ears:  External ear exam shows no significant lesions or deformities.   Nose:  External nasal examination shows no deformity or  inflammation. Nasal mucosa are pink and moist without lesions, exudates Oral exam: Lips and gums are healthy appearing.There is no oropharyngeal erythema or exudate. Neck:  No thyromegaly, masses, tenderness noted.    Heart:  Apparent normal rate and regular rhythm. S1 and S2 normal without gallop, murmur, click, rub.  Lungs:  without wheezes, rhonchi, rales, rubs. Abdomen: Bowel sounds are normal.  Abdomen is soft and nontender with no organomegaly, hernias, masses. GU: Deferred  Extremities:  No cyanosis, clubbing Neurologic exam:  Balance, Rhomberg, finger to nose testing could not be completed due to clinical state Skin: Warm & dry w/o tenting. No significant lesions or rash.  See clinical summary under each active problem in the Problem List with associated updated therapeutic plan

## 2018-06-15 NOTE — Assessment & Plan Note (Signed)
Opioids contraindicated due to high risk of addiction and increased mortality

## 2018-06-17 ENCOUNTER — Emergency Department (HOSPITAL_COMMUNITY)
Admission: EM | Admit: 2018-06-17 | Discharge: 2018-06-17 | Disposition: A | Discharge status: Home / Self Care | Payer: Medicare Other | Attending: Emergency Medicine | Admitting: Emergency Medicine

## 2018-06-17 ENCOUNTER — Emergency Department (HOSPITAL_BASED_OUTPATIENT_CLINIC_OR_DEPARTMENT_OTHER): Payer: Medicare Other

## 2018-06-17 ENCOUNTER — Encounter (HOSPITAL_COMMUNITY): Payer: Self-pay | Admitting: Emergency Medicine

## 2018-06-17 ENCOUNTER — Emergency Department (HOSPITAL_COMMUNITY): Payer: Medicare Other

## 2018-06-17 DIAGNOSIS — I5042 Chronic combined systolic (congestive) and diastolic (congestive) heart failure: Secondary | ICD-10-CM | POA: Diagnosis not present

## 2018-06-17 DIAGNOSIS — Z7901 Long term (current) use of anticoagulants: Secondary | ICD-10-CM | POA: Diagnosis not present

## 2018-06-17 DIAGNOSIS — R609 Edema, unspecified: Secondary | ICD-10-CM

## 2018-06-17 DIAGNOSIS — D500 Iron deficiency anemia secondary to blood loss (chronic): Secondary | ICD-10-CM | POA: Diagnosis not present

## 2018-06-17 DIAGNOSIS — F17210 Nicotine dependence, cigarettes, uncomplicated: Secondary | ICD-10-CM | POA: Diagnosis not present

## 2018-06-17 DIAGNOSIS — M79662 Pain in left lower leg: Secondary | ICD-10-CM | POA: Insufficient documentation

## 2018-06-17 DIAGNOSIS — I110 Hypertensive heart disease with heart failure: Secondary | ICD-10-CM | POA: Insufficient documentation

## 2018-06-17 DIAGNOSIS — R5381 Other malaise: Secondary | ICD-10-CM | POA: Diagnosis not present

## 2018-06-17 DIAGNOSIS — R509 Fever, unspecified: Secondary | ICD-10-CM | POA: Diagnosis not present

## 2018-06-17 DIAGNOSIS — M2550 Pain in unspecified joint: Secondary | ICD-10-CM | POA: Diagnosis not present

## 2018-06-17 DIAGNOSIS — Z79899 Other long term (current) drug therapy: Secondary | ICD-10-CM | POA: Insufficient documentation

## 2018-06-17 DIAGNOSIS — Z7401 Bed confinement status: Secondary | ICD-10-CM | POA: Diagnosis not present

## 2018-06-17 LAB — BASIC METABOLIC PANEL
Anion gap: 7 (ref 5–15)
BUN: 10 mg/dL (ref 8–23)
CALCIUM: 8.6 mg/dL — AB (ref 8.9–10.3)
CO2: 21 mmol/L — ABNORMAL LOW (ref 22–32)
CREATININE: 1.9 mg/dL — AB (ref 0.61–1.24)
Chloride: 111 mmol/L (ref 98–111)
GFR calc Af Amer: 40 mL/min — ABNORMAL LOW (ref >60)
GFR calc non Af Amer: 35 mL/min — ABNORMAL LOW (ref >60)
GLUCOSE: 105 mg/dL — AB (ref 70–99)
Potassium: 4.7 mmol/L (ref 3.5–5.1)
Sodium: 139 mmol/L (ref 135–145)

## 2018-06-17 LAB — CBC WITH DIFFERENTIAL/PLATELET
Abs Immature Granulocytes: 0 10*3/uL (ref 0.0–0.1)
Basophils Absolute: 0 10*3/uL (ref 0.0–0.1)
Basophils Relative: 1 %
EOS ABS: 0.1 10*3/uL (ref 0.0–0.7)
EOS PCT: 4 %
HCT: 28.2 % — ABNORMAL LOW (ref 39.0–52.0)
Hemoglobin: 8.7 g/dL — ABNORMAL LOW (ref 13.0–17.0)
Immature Granulocytes: 0 %
LYMPHS ABS: 0.8 10*3/uL (ref 0.7–4.0)
Lymphocytes Relative: 23 %
MCH: 30 pg (ref 26.0–34.0)
MCHC: 30.9 g/dL (ref 30.0–36.0)
MCV: 97.2 fL (ref 78.0–100.0)
MONOS PCT: 17 %
Monocytes Absolute: 0.6 10*3/uL (ref 0.1–1.0)
Neutro Abs: 2 10*3/uL (ref 1.7–7.7)
Neutrophils Relative %: 55 %
Platelets: 231 10*3/uL (ref 150–400)
RBC: 2.9 MIL/uL — ABNORMAL LOW (ref 4.22–5.81)
RDW: 14.3 % (ref 11.5–15.5)
WBC: 3.6 10*3/uL — ABNORMAL LOW (ref 4.0–10.5)

## 2018-06-17 LAB — URINALYSIS, ROUTINE W REFLEX MICROSCOPIC
BACTERIA UA: NONE SEEN
BILIRUBIN URINE: NEGATIVE
Glucose, UA: NEGATIVE mg/dL
Ketones, ur: NEGATIVE mg/dL
Leukocytes, UA: NEGATIVE
NITRITE: NEGATIVE
Protein, ur: NEGATIVE mg/dL
SPECIFIC GRAVITY, URINE: 1.004 — AB (ref 1.005–1.030)
pH: 5 (ref 5.0–8.0)

## 2018-06-17 LAB — POC OCCULT BLOOD, ED: FECAL OCCULT BLD: POSITIVE — AB

## 2018-06-17 LAB — I-STAT CG4 LACTIC ACID, ED: Lactic Acid, Venous: 0.76 mmol/L (ref 0.5–1.9)

## 2018-06-17 MED ORDER — AMOXICILLIN-POT CLAVULANATE 875-125 MG PO TABS: 1.0000 | ORAL_TABLET | Freq: Two times a day (BID) | ORAL | 0 refills | Status: DC

## 2018-06-17 MED ORDER — AMOXICILLIN-POT CLAVULANATE 875-125 MG PO TABS: 1.0000 | ORAL_TABLET | Freq: Once | ORAL | Status: DC

## 2018-06-17 NOTE — ED Provider Notes (Addendum)
[facility] EMERGENCY DEPARTMENT Provider Note   CSN: [id number] Arrival date & time: 06/17/18  1042     History   Chief Complaint Chief Complaint  Patient presents with  . Fever    HPI Mitchell [name] is a 67 y.o. male.  The history is provided by the patient, the spouse and medical records. No language interpreter was used.     67 year old male with history of hypertension, recurrent urinary tract infection, CHF, polysubstance abuse, seizure disorder brought here via EMS from home for evaluation of fever.  History obtained through wife at bedside.  Patient developed seizure-like activities on 9/11 2019 and was admitted until 9/18 2019.  He is currently staying at a skilled nursing facility.  He sent here today due to a fever of 106 according to his wife.  Patient at this time does not have any specific complaint.  He does not feel that he has having a fever.  He denies any headache, no productive cough, no chest pain or shortness of breath no abdominal pain dysuria.  He does report intermittent discomfort in his left leg with swelling which has been present for unknown amount of time.  Has history of gout but does not think he has a gout flare.  He denies having confusion. Wife notice pt is having abnormal movement of his mouth this AM and voice concern.    Past Medical History:  Diagnosis Date  . Alcohol abuse   . Gout   . Hyperlipemia   . Hypertension     Patient Active Problem List   Diagnosis Date Noted  . Seizure disorder ([place]) 06/15/2018  . History of substance abuse 06/15/2018  . Myoclonus 06/15/2018  . Abnormal EKG 06/15/2018  . CVA (cerebral vascular accident) ([place]) 06/07/2018  . Acute pancreatitis 03/08/2017  . Chronic combined systolic and diastolic CHF (congestive heart failure) ([place]) 07/28/2015  . PEG (percutaneous endoscopic gastrostomy) status ([place]) 07/28/2015  . History of stroke 07/28/2015  . Primary gout   . Mental status change   .  Dysphagia   . Acute respiratory failure with hypoxemia ([place])   . Pulmonary edema   . Tracheal perforation   . CAP (community acquired pneumonia)   . Chronic systolic CHF (congestive heart failure) ([place])   . Acute renal failure ([place])   . Alcohol abuse   . Anoxic brain injury ([place])   . Thrombocytopenia ([place])   . History of ETT   . Cardiac arrest ([place]) 07/08/2015  . 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 Hyperlipidemia 11/24/2006  . Gout, unspecified 11/24/2006  . HYPERTENSION, BENIGN SYSTEMIC 11/24/2006  . HEMORRHOIDS, NOS 11/24/2006  . DIVERTICULOSIS OF COLON 11/24/2006    Past Surgical History:  Procedure Laterality Date  . RADIOLOGY WITH ANESTHESIA N/A 07/17/2015   Procedure: RADIOLOGY WITH ANESTHESIA;  Surgeon: Medication Radiologist, MD;  Location: [facility];  Service: Radiology;  Laterality: N/A;        Home Medications    Prior to Admission medications   Medication Sig Start Date End Date Taking? Authorizing Provider  acetaminophen (TYLENOL) 325 MG tablet Take 2 tablets (650 mg total) by mouth every 6 (six) hours as needed for mild pain, moderate pain or fever (or Fever >/= 101). 03/09/17   Hongalgi, Lenis Dickinson, MD  amLODipine (NORVASC) 5 MG tablet Take 1 tablet (5 mg total) by mouth daily. 06/15/18   Patrecia Pour, MD  atorvastatin (LIPITOR) 40 MG tablet Take  1 tablet (40 mg total) by mouth daily at 6 PM. 06/14/18   Patrecia Pour, MD  carvedilol (COREG) 12.5 MG tablet Take 1 tablet (12.5 mg total) by mouth 2 (two) times daily with a meal. 06/14/18   Patrecia Pour, MD  COLCRYS 0.6 MG tablet Take 0.6 mg by mouth 2 (two) times a week.  01/29/17   [provider]  folic acid (FOLVITE) 1 MG tablet Take 1 tablet (1 mg total) by mouth daily. 07/19/15   Allie Bossier, MD  furosemide (LASIX) 20 MG tablet Take 20 mg by mouth daily.  05/12/18   [provider]  levETIRAcetam (KEPPRA) 500 MG tablet Take 1  tablet (500 mg total) by mouth 2 (two) times daily. 06/14/18   Patrecia Pour, MD  multivitamin (PROSIGHT) TABS tablet Take 1 tablet by mouth daily. 07/19/15   Allie Bossier, MD  pantoprazole (PROTONIX) 40 MG tablet Take 1 tablet (40 mg total) by mouth daily. 03/09/17   Hongalgi, Lenis Dickinson, MD  potassium chloride (KLOR-CON M15) 15 MEQ tablet Take 2 tablets (30 mEq total) by mouth daily. 07/27/15   Cherene Altes, MD  QUEtiapine (SEROQUEL) 25 MG tablet Take 0.5 tablets (12.5 mg total) by mouth 2 (two) times daily. 06/14/18   Patrecia Pour, MD  rivaroxaban (XARELTO) 20 MG TABS tablet Take 1 tablet (20 mg total) by mouth daily with supper. 08/14/15   Cherene Altes, MD  thiamine 100 MG tablet Take 1 tablet (100 mg total) by mouth daily. 06/14/18   Patrecia Pour, MD  ULORIC 80 MG TABS Take 1 tablet by mouth daily.  12/28/16   [provider]    Family History Family History  Problem Relation Age of Onset  . Aneurysm Sister   . Clotting disorder Brother   . Aneurysm Mother     Social History Social History   Tobacco Use  . Smoking status: Current Some Day Smoker    Types: Cigars  . Smokeless tobacco: Never Used  Substance Use Topics  . Alcohol use: No    Comment: History of alcohol abuse  . Drug use: Yes    Types: Marijuana     Allergies   Hydrochlorothiazide and Pseudoephedrine   Review of Systems Review of Systems  All other systems reviewed and are negative.    Physical Exam Updated Vital Signs BP 133/72 (BP Location: Right Arm)   Pulse 79   Temp 99.3 F (37.4 C) (Oral)   Resp 18   SpO2 98%   Physical Exam  Constitutional: He is oriented to person, place, and time. He appears well-developed and well-nourished. No distress.  Elderly male laying in bed in no acute discomfort.  HENT:  Head: Atraumatic.  Mouth/Throat: Oropharynx is clear and moist.  Eyes: Pupils are equal, round, and reactive to light. Conjunctivae and EOM are normal.  Neck: Normal range  of motion. Neck supple.  No nuchal rigidity  Cardiovascular: Normal rate and regular rhythm.  Pulmonary/Chest: Effort normal and breath sounds normal. He exhibits no tenderness.  Abdominal: Soft. Bowel sounds are normal. He exhibits no distension. There is no tenderness.  Musculoskeletal: He exhibits edema (Left lower extremity: 2+ pitting edema extending towards the knee.  Difficult to palpate dorsalis pedis pulse however cap refills intact and brisk.).  No midline spine tenderness  Neurological: He is alert and oriented to person, place, and time.  Skin: No rash noted.  Psychiatric: He has a normal mood and affect.  Nursing note and vitals reviewed.    ED Treatments / Results  Labs (all labs ordered are listed, but only abnormal results are displayed) Labs Reviewed  CBC WITH DIFFERENTIAL/PLATELET - Abnormal; Notable for the following components:      Result Value   WBC 3.6 (*)    RBC 2.90 (*)    Hemoglobin 8.7 (*)    HCT 28.2 (*)    All other components within normal limits  BASIC METABOLIC PANEL - Abnormal; Notable for the following components:   CO2 21 (*)    Glucose, Bld 105 (*)    Creatinine, Ser 1.90 (*)    Calcium 8.6 (*)    GFR calc non Af Amer 35 (*)    GFR calc Af Amer 40 (*)    All other components within normal limits  URINALYSIS, ROUTINE W REFLEX MICROSCOPIC - Abnormal; Notable for the following components:   Color, Urine STRAW (*)    Specific Gravity, Urine 1.004 (*)    Hgb urine dipstick SMALL (*)    All other components within normal limits  POC OCCULT BLOOD, ED - Abnormal; Notable for the following components:   Fecal Occult Bld POSITIVE (*)    All other components within normal limits  I-STAT CG4 LACTIC ACID, ED    EKG None  Radiology Dg Chest 2 View  Result Date: 06/17/2018 CLINICAL DATA:  pt here for eval of fever with an unknown source and tremors with a hx of tremors since his ETOH related seizure on the 12th. Family member states that his  tremors have gotten worse. EXAM: CHEST - 2 VIEW COMPARISON:  06/10/2018 FINDINGS: Cardiac silhouette is normal in size. Aorta is prominent and uncoiled. No mediastinal or hilar masses. No evidence of adenopathy. Clear lungs.  No pleural effusion or pneumothorax. Skeletal structures are intact. IMPRESSION: No active cardiopulmonary disease. Electronically Signed   By: Lajean Manes M.D.   On: 06/17/2018 12:59    Procedures Procedures (including critical care time)  VASCULAR LAB PRELIMINARY  PRELIMINARY  PRELIMINARY  PRELIMINARY  Left lower extremity venous duplex completed.    Preliminary report:  There is no acute DVT noted in the left lower extremity.  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    Called results to Domenic Moras, PA-C  KANADY, [place], RVT 06/17/2018, 12:28 PM   Medications Ordered in ED Medications - No data to display   Initial Impression / Assessment and Plan / ED Course  I have reviewed the triage vital signs and the nursing notes.  Pertinent labs & imaging results that were available during my care of the patient were reviewed by me and considered in my medical decision making (see chart for details).     BP 133/72 (BP Location: Right Arm)   Pulse 79   Temp 99.6 F (37.6 C) (Rectal)   Resp 18   SpO2 98%    Final Clinical Impressions(s) / ED Diagnoses   Final diagnoses:  Anemia due to GI blood loss    ED Discharge Orders    None     11:43 AM Patient sent here for evaluation of fever of unknown source.  However, no documented fever in the ED and patient denies feeling febrile.  His only complaint is discomfort in his left leg with leg swelling of unknown.  Of time.  On exam he does have edema through his left lower extremity.  Due to recent hospitalization and lack of ambulation, will  obtain DVT study.  Will screen with basic labs.  Chest x-ray and UA ordered.  Patient does not have any nuchal rigidity  concerning for meningitis.  12:59 PM Normal lactic acid, hemoglobin is 8.7, and decreased from recent globin of 5.3, will check Hemoccult.  Evidence of renal insufficiency with a creatinine of 1.9, near baseline.  Left lower extremity DVT study showing no acute DVT.  There is chronic DVT in the left common femoral and popliteal vein.  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  He has been back to the rehab facility for approximately 3 days when he sent here for fever.  He does not have any documented fever in the ED.  White count is 3.6, normal lactic acid chest x-ray without evidence of infection.  UA is pending.  Hemoccult blood test is negative.  I have reached out to [facility] and spoke with the staff.  Pt had a fever of 101.3 this AM, and family also was concern he is not fully detox from alcohol and was requesting for him to be evaluated.    Pt is mentating appropriately, no evidence of seizure or postictal state.  Wife report abnormal movement of the mouth.  Pt was seen puffing out  His cheek periodically.  No rigidity, no new focal neuro deficit, doubt stroke or serotonin syndrome.  Doubt dystonia. Care discussed with Dr. Ellender Hose.    3:04 PM Hemoglobin today is 8.7, it was 9.3 approximately a week ago.  Hemoccult is positive.  Patient is currently on Xarelto for DVT.  He has a benign abdominal exam, his stool is not black or tarry, his back examination is unremarkable.  He is resting comfortably.  Encourage repeat of CBC in 48 hours.  3:37 PM UA without evidence of UTI.  Pt stable for discharge to return to facility.    3:50 PM Dr. Ellender Hose has seen and evaluated pt.  Since he had recent seizure, mild rales on RLL base, and low grade fever; recommend augmentin for potential aspiration pneumonia.     Domenic Moras, PA-C 06/17/18 1551    Duffy Bruce, MD 06/18/18 0900

## 2018-06-17 NOTE — Progress Notes (Signed)
VASCULAR LAB PRELIMINARY  PRELIMINARY  PRELIMINARY  PRELIMINARY  Left lower extremity venous duplex completed.    Preliminary report:  There is no acute DVT noted in the left lower extremity.  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    Called results to Domenic Moras, PA-C  Lysbeth Dicola, [place], RVT 06/17/2018, 12:28 PM

## 2018-06-17 NOTE — Discharge Instructions (Signed)
You have been evaluated for your fever today.  No evidence of fever in the ER.  No evidence of infection were identify.  Your hemoglobin is 8.7 consistent with anemia.  Trace of blood were detected in your stool which may be the source of the bleeding. Please have your doctor recheck your blood count in 48 hrs and follow up with a gastroenterologist for further care.  Return if you have any concerns.

## 2018-06-17 NOTE — ED Triage Notes (Addendum)
Per PT[facility], pt here for eval of fever with an unknown source and tremors with a hx of tremors since his ETOH related seizure on the 12th. Family member states that his tremors have gotten worse and he has been doing a strange motion with his mouth since Monday. Pt denise any pain with urination, any congestion, any abdominal pain, nausea or diarrhea. Denies any pain anywhere. Pt is alert and oriented. Last drink was 2 weeks ago.

## 2018-06-17 NOTE — ED Notes (Signed)
Doppler pulse in left foot, marked with skin marker

## 2018-06-19 LAB — CBC AND DIFFERENTIAL
HCT: 27 — AB (ref 41–53)
Hemoglobin: 9.1 — AB (ref 13.5–17.5)
NEUTROS ABS: 4
Platelets: 252 (ref 150–399)
WBC: 6.1

## 2018-06-19 LAB — BASIC METABOLIC PANEL
BUN: 16 (ref 4–21)
CREATININE: 2.1 — AB (ref 0.6–1.3)
CREATININE: 2.1 — AB (ref 0.6–1.3)
GLUCOSE: 93
Glucose: 93
Potassium: 4.9 (ref 3.4–5.3)
SODIUM: 139 (ref 137–147)

## 2018-06-20 ENCOUNTER — Encounter: Payer: Self-pay | Admitting: Internal Medicine

## 2018-06-20 ENCOUNTER — Non-Acute Institutional Stay (SKILLED_NURSING_FACILITY): Payer: Medicare Other | Admitting: Internal Medicine

## 2018-06-20 DIAGNOSIS — G253 Myoclonus: Secondary | ICD-10-CM

## 2018-06-20 DIAGNOSIS — N179 Acute kidney failure, unspecified: Secondary | ICD-10-CM

## 2018-06-20 DIAGNOSIS — I10 Essential (primary) hypertension: Secondary | ICD-10-CM | POA: Insufficient documentation

## 2018-06-20 DIAGNOSIS — G40909 Epilepsy, unspecified, not intractable, without status epilepticus: Secondary | ICD-10-CM

## 2018-06-20 NOTE — Patient Instructions (Signed)
See assessment and plan under each diagnosis in the problem list and acutely for this visit 

## 2018-06-20 NOTE — Assessment & Plan Note (Addendum)
06/20/2018 left-sided seizure activity suggested clinically This is in the context of accelerated hypertension and renal insufficiency Clinical need for Keppra discussed with wife who is in agreement to continue it D/C Seroquel as there is no dysfunctional behavior as was noted in the hospital Neurology outpatient follow-up ASAP.  I have contacted the hospital neuro team to verify outpatient Neurology follow-up

## 2018-06-20 NOTE — Assessment & Plan Note (Addendum)
06/17/18 creatinine had risen to 1.90 from a value of 1.64 and GFR had dropped from 41-35 Medication adjustment for renal insufficiency BMET

## 2018-06-20 NOTE — Progress Notes (Signed)
NURSING HOME LOCATION:  [facility] ROOM NUMBER:  [id number]  CODE STATUS:  Full Code  PCP:  [name], [address] [place] [address]  This is a nursing facility follow up for specific acute issue of monoclonal jerking and preop clearance for cataract surgery.  Interim medical record and care since last [place] visit was updated with review of diagnostic studies and change in clinical status since last visit were documented.  HPI: The patient has continued to have monoclonal jerking.  According to medical references (Up to Date) the treatment of choice is Keppra which can be titrated every 2 weeks.  The patient has not been on Keppra long enough to titrate the dose upward.  In fact the patient's family refused to have the [place] administered last night.  Staff now reports that the patient is having jerking of the entire body and is unable to follow commands.  He was noted to be hypertensive with a blood pressure 182/92 & temperature 100. Dr. Katy Fitch plans cataract surgery 06/26/2018 and has provided pre-and postop orders for such. The patient was seen in the ED 06/17/2018 with fever attributed to occult aspiration pneumonia. Empiric treatment was initiated. I see no infiltrate on that film upon personal review (see below). Today PT/OT staff told me that he was actually sent to the ER , not for fever but because he was found on the floor by his wife.  The patient had informed PT/OT staff that he had struck his head.  That information was not provided the ED physician.   While hospitalized 9/11-9/18/2019 he ran low-grade fevers attributed to gout flare of the knee.    Hemoglobin was serially noted to have decreased slightly.  Hemoccult was positive but stools were soft and brown without melena.  The etiology of the positive FOB was felt to be alcoholic gastritis. The chest x-ray was actually reviewed personally as stated.  There was some increased reticulonodular  changes.  There was suggestion of possible early infiltrate in the left lower lobe but this was not definite as there was rotational variation with more hyperlucency on the right.  There was also irregular "popcorn" configuration calcific type changes in the left hilum medially or bronchus.  Formal interpretation was no active disease.  White count was 3600 with a normal differential.  Hemoglobin had dropped from 9.3 down to 8.7 and hematocrit from 28.7-28.2.  Creatinine had increased to 1.90 from a value of 1.64.  GFR had dropped from 41-35.  Review of systems: PT/OT states that the patient is able to walk using a walker.  Gait is described as slightly asymmetric.  He also tends to exhibit neglect of the left upper extremity.  His left hand must be actually placed on the walker but intermittently will fly outward.  Also they have noted drooling as a new phenomena. The patient denies any significant active symptoms other than being "tired".  He also describes a nonproductive cough "every blue moon".  Apparently he had some loose stool this morning.  The remainder of the review of systems is negative.  His wife cannot add any additional history in reference to review of systems.  Constitutional: No significant weight change  Eyes: No redness, discharge, pain, vision change ENT/mouth: No nasal congestion,  purulent discharge, earache, change in hearing, sore throat  Cardiovascular: No chest pain, palpitations, paroxysmal nocturnal dyspnea, claudication, edema  Respiratory: No active cough, sputum production, hemoptysis   Gastrointestinal: No heartburn,  dysphagia, abdominal pain, nausea /vomiting, rectal bleeding, melena, change in bowels Genitourinary: No dysuria, hematuria, pyuria, incontinence, nocturia Musculoskeletal: No joint stiffness, joint swelling, weakness, pain Dermatologic: No rash, pruritus, change in appearance of skin Neurologic: No dizziness, headache, syncope, numbness,  tingling Endocrine: No change in hair/skin/nails, excessive thirst, excessive hunger, excessive urination  Hematologic/lymphatic: No significant bruising, lymphadenopathy, abnormal bleeding  Physical exam:  Pertinent or positive findings: The patient is lethargic and is observed to have constant jerking mainly on the left.  His lips jerk to the left as does the left shoulder this results in the secondary jerking tremor of the left hand.  There is also involuntary movement of the abdomen mimicking hiccuping.  Bilateral ptosis is present.  Arcus senilis is present.  There is exotropia of the left eye.  Complete dentures are present.  He has 1+ pitting edema.  Pedal pulses are decreased.  There is weakness of the left upper and left lower extremity with minimal range of motion.  General appearance:  no acute distress, increased work of breathing is present.   Lymphatic: No lymphadenopathy about the head, neck, axilla. Eyes: No conjunctival inflammation or lid edema is present. There is no scleral icterus. Ears:  External ear exam shows no significant lesions or deformities.   Nose:  External nasal examination shows no deformity or inflammation. Nasal mucosa are pink and moist without lesions, exudates Oral exam:  Lips and gums are healthy appearing. There is no oropharyngeal erythema or exudate. Neck:  No thyromegaly, masses, tenderness noted.    Heart:  Normal rate and regular rhythm. S1 and S2 normal without gallop, murmur, click, rub .  Lungs:  without wheezes, rhonchi, rales, rubs. Abdomen: Bowel sounds are normal. Abdomen is soft and nontender with no organomegaly, hernias, masses. GU: Deferred  Extremities:  No cyanosis, clubbing Neurologic exam : Balance, Rhomberg, finger to nose testing could not be completed due to clinical state Skin: Warm & dry w/o tenting. No significant lesions or rash.  See summary under each active problem in the Problem List with associated updated therapeutic  plan  '

## 2018-06-20 NOTE — Assessment & Plan Note (Addendum)
This may be contributing to the seizure type activity suggested on the left Antihypertensive adjustment in the context of renal insufficiency; see orders

## 2018-06-20 NOTE — Assessment & Plan Note (Signed)
06/20/2018 myoclonal activity is mainly on the left mimicking seizure Clinical need for Keppra discussed with wife, she is in agreement to continue this medication

## 2018-06-21 ENCOUNTER — Encounter: Payer: Self-pay | Admitting: Internal Medicine

## 2018-06-21 LAB — CBC AND DIFFERENTIAL
HEMATOCRIT: 26 — AB (ref 41–53)
Hemoglobin: 8.7 — AB (ref 13.5–17.5)
NEUTROS ABS: 6
PLATELETS: 238 (ref 150–399)
WBC: 8.7

## 2018-06-21 LAB — HEPATIC FUNCTION PANEL
ALK PHOS: 67 (ref 25–125)
ALT: 15 (ref 10–40)
AST: 36 (ref 14–40)
BILIRUBIN, TOTAL: 0.3

## 2018-06-21 LAB — BASIC METABOLIC PANEL
BUN: 14 (ref 4–21)
Creatinine: 1.6 — AB (ref 0.6–1.3)
Glucose: 75
Potassium: 5 (ref 3.4–5.3)
SODIUM: 142 (ref 137–147)

## 2018-06-22 ENCOUNTER — Encounter: Payer: Self-pay | Admitting: Internal Medicine

## 2018-06-22 ENCOUNTER — Non-Acute Institutional Stay (SKILLED_NURSING_FACILITY): Payer: Medicare Other | Admitting: Internal Medicine

## 2018-06-22 DIAGNOSIS — R296 Repeated falls: Secondary | ICD-10-CM | POA: Diagnosis not present

## 2018-06-22 DIAGNOSIS — M109 Gout, unspecified: Secondary | ICD-10-CM

## 2018-06-22 DIAGNOSIS — R600 Localized edema: Secondary | ICD-10-CM

## 2018-06-22 DIAGNOSIS — D649 Anemia, unspecified: Secondary | ICD-10-CM

## 2018-06-22 DIAGNOSIS — G253 Myoclonus: Secondary | ICD-10-CM

## 2018-06-22 DIAGNOSIS — N179 Acute kidney failure, unspecified: Secondary | ICD-10-CM | POA: Diagnosis not present

## 2018-06-22 NOTE — Assessment & Plan Note (Signed)
Initiate oral iron

## 2018-06-22 NOTE — Progress Notes (Signed)
NURSING HOME LOCATION:  [facility] ROOM NUMBER:  [id number]  CODE STATUS:  Full Code  PCP:  [name], [address] [place] [address]  This is a nursing facility follow up for specific acute issue of swelling of L hand post fall.  Interim medical record and care since last [place] visit was updated with review of diagnostic studies and change in clinical status since last visit were documented.  HPI: Staff reported that the patient had an unobserved fall.  He stated that he was reaching to for his phone and fell out of the bed.  There was no obvious neuro or muscular deficit.  Vital signs were stable.  Swelling of the left hand was documented.  Staff did not know its exact onset; but his wife was present when I examined the patient.  She states that he had swelling yesterday prior to this fall. The extensive labs performed to evaluate his myoclonus were reviewed and discussed with his wife.  His acute renal insufficiency has improved as the creatinine has dropped from 2.14 down to 1.64.  He was found to have hypomagnesemia with a value of 1.3.  Mag oxide has been ordered.   He is on Xarelto & positive FOBT documented in the ED on 9/21.  Hemoglobin has dropped from 9.1 and hematocrit 27.2 down to values of 8.7 and 26.3.  Indices are normochromic, normocytic.  Review of systems: He denies any pain in the hand.  He has had some discomfort in the upper part of the left arm.  He also has had some discomfort in the right side since the fall. He denies any bleeding dyscrasias or evidence of GI bleed.  Constitutional: No fever, significant weight change, fatigue  Cardiovascular: No chest pain, palpitations, paroxysmal nocturnal dyspnea Respiratory: No cough, sputum production, hemoptysis, DOE  Gastrointestinal: No heartburn, dysphagia, abdominal pain, nausea /vomiting, rectal bleeding, melena, change in bowels Genitourinary: No dysuria, hematuria, pyuria,  incontinence, nocturia Dermatologic: No rash, pruritus, change in appearance of skin Neurologic: No dizziness, headache, syncope Hematologic/lymphatic: No significant bruising, lymphadenopathy, abnormal bleeding  Physical exam:  Pertinent or positive findings: He is significantly more alert today and oriented.  He jokingly told me that he was the president initially but did identify Trump. The myoclonic jerking activity has changed somewhat.  It is present around the mouth but is less pronounced.  There is also jerking of the left lower extremity.  The previously noted jerking of the left upper extremity has resolved.  In fact the left upper extremity is now flaccid. He is edentulous.  He has ptosis greater on the left than the right.  Higin[place] Plan and [place] are present.  Heart rhythm appears slightly irregular.  Trace edema of the ankles.  This is also improved compared to prior exam.  There is marked edema of the left wrist and hand.  As noted the left upper extremity is limp and falls to the bed when lifted.  He does have medial deviation of the fingers.  There is slight tenderness to palpation over the right side and flank.  There is no evidence of bruising in this area.  He does have slight discomfort with elevation & rotation of the shoulder but this is not significant clinically.  There is no no tenderness to compression of the hand.  General appearance: no acute distress, increased work of breathing is present.   Lymphatic: No lymphadenopathy about the head, neck, axilla. Eyes: No conjunctival  inflammation or lid edema is present. There is no scleral icterus. Ears:  External ear exam shows no significant lesions or deformities.   Nose:  External nasal examination shows no deformity or inflammation. Nasal mucosa are pink and moist without lesions, exudates Oral exam:  Lips and gums are healthy appearing.  Neck:  No thyromegaly, masses, tenderness noted.    Heart:  No gallop, murmur, click, rub .    Lungs:  without wheezes, rhonchi, rales, rubs. Abdomen: Bowel sounds are normal. Abdomen is soft and nontender with no organomegaly, hernias, masses. GU: Deferred  Extremities:  No cyanosis, clubbing  Neurologic exam : Balance, Rhomberg, finger to nose testing could not be completed due to clinical state Skin: Warm & dry w/o tenting. No significant lesions or rash.  See summary under each active problem in the Problem List with associated updated therapeutic plan

## 2018-06-22 NOTE — Assessment & Plan Note (Addendum)
Pattern has changed compared to 06/20/2018.  There is decreased jerking around the mouth and LUE is no longer jerking at all & in fact is flaccid.  He continues to have jerking in the left lower extremity. Neurology consultation has been requested as soon as possible.  I verified with his PCP and the neuro hospitalist that there is no referral on record. 06/22/18 Dr Jason Coop recommended adding Valproic acid if myoclonus progresses by email

## 2018-06-23 ENCOUNTER — Encounter: Payer: Self-pay | Admitting: Internal Medicine

## 2018-06-23 NOTE — Assessment & Plan Note (Signed)
Creatinine significantly improved; potentially nephrotoxic agents D/Ced

## 2018-06-23 NOTE — Assessment & Plan Note (Signed)
Uric acid 3.4; Colcrys & Uloric D/Ced

## 2018-06-23 NOTE — Patient Instructions (Signed)
See assessment and plan under each diagnosis in the problem list and acutely for this visit Total time 43  minutes; greater than 50% of the visit spent counseling patient & wife and coordinating care for problems addressed at this encounter

## 2018-06-26 LAB — CBC AND DIFFERENTIAL
HEMATOCRIT: 26 — AB (ref 41–53)
Hemoglobin: 8.8 — AB (ref 13.5–17.5)
Neutrophils Absolute: 5
Platelets: 294 (ref 150–399)
WBC: 6.9

## 2018-06-26 LAB — BASIC METABOLIC PANEL
BUN: 16 (ref 4–21)
Creatinine: 1.6 — AB (ref 0.6–1.3)
GLUCOSE: 91
POTASSIUM: 5.6 — AB (ref 3.4–5.3)
Sodium: 141 (ref 137–147)

## 2018-06-27 LAB — BASIC METABOLIC PANEL
BUN: 22 — AB (ref 4–21)
CREATININE: 1.6 — AB (ref 0.6–1.3)
Glucose: 93
POTASSIUM: 4.7 (ref 3.4–5.3)
Sodium: 141 (ref 137–147)

## 2018-06-29 ENCOUNTER — Encounter: Payer: Self-pay | Admitting: Neurology

## 2018-06-29 ENCOUNTER — Ambulatory Visit (INDEPENDENT_AMBULATORY_CARE_PROVIDER_SITE_OTHER): Payer: Medicare Other | Admitting: Neurology

## 2018-06-29 VITALS — BP 127/74 | HR 65

## 2018-06-29 DIAGNOSIS — I69354 Hemiplegia and hemiparesis following cerebral infarction affecting left non-dominant side: Secondary | ICD-10-CM | POA: Insufficient documentation

## 2018-06-29 DIAGNOSIS — R569 Unspecified convulsions: Secondary | ICD-10-CM | POA: Insufficient documentation

## 2018-06-29 MED ORDER — LEVETIRACETAM 500 MG PO TABS: ORAL_TABLET | ORAL | 11 refills | Status: DC

## 2018-06-29 NOTE — Progress Notes (Signed)
PATIENT: Mitchell [name] DOB: 11-Feb-1951  Chief Complaint  Patient presents with  . Seizures/Myoclonus    He is here with his wife, Mitchell [name] and daughter, Mitchell [name].  He is currently in [place] but hopes to be discharged home by next month.  He has had some witnessed monoclonal jerking.  His wife states during one episode, he was also foaming at the mouth and his eyes were rolled back in his head.  He was started on Keppra 500mg , one tablet BID.  He has noticed a few more events but they have been less frequent.  . Referred from rehab    Mitchell Darner Darrick Penna, MD  . PCP    Mitchell Beard, MD     HISTORICAL  Mitchell [name] is a 67 year old male, accompanied by his wife, and daughter Mitchell [name] at today's visit, seen in request by his primary care physician Dr. Linna Darner, Darrick [name], and Dr. [name], MD from his current [facility] for evaluation of seizure-like event, uncontrolled left-sided body jerking, initial evaluation was on July 03, 2018.  I have reviewed and summarized the referring note from the referring physician.  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  On June 07, 2018, he was at home, witnessed by his wife had sudden onset eyes rolling backwards, body jerking, convulsions for 2 minutes, followed by confusion afterwards.  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  He did have confusion following prolonged seizure event, last prolonged jerking episode was few days ago, he is not at much lower level of functional status, came in with wheelchair, was noted to have increased left-sided weakness.  REVIEW OF SYSTEMS: Full 14 system review of systems performed and notable only for fever, chills, weight loss, swelling legs, blurred vision, snoring, itching, moles, easy bruising, easy bleeding, feeling hot, feeling cold, joint pain, swelling, achy muscles, memory loss confusion headaches speech snoring restless leg tremor too much sleep not enough sleep decreased energy disinterested  in the activity hallucinations All other review of systems were negative.  ALLERGIES: Allergies  Allergen Reactions  . Hydrochlorothiazide     REACTION: precipitates gout  . Pseudoephedrine     REACTION: rash    HOME MEDICATIONS: Current Outpatient Medications  Medication Sig Dispense Refill  . acetaminophen  (TYLENOL) 325 MG tablet Take 2 tablets (650 mg total) by mouth every 6 (six) hours as needed for mild pain, moderate pain or fever (or Fever >/= 101).    Marland Kitchen amLODipine (NORVASC) 10 MG tablet Take 10 mg by mouth daily.    Marland Kitchen atorvastatin (LIPITOR) 40 MG tablet Take 1 tablet (40 mg total) by mouth daily at 6 PM.    . calcium-vitamin D (OSCAL WITH D) 500-200 MG-UNIT tablet Take 1 tablet by mouth 2 (two) times daily.    . carvedilol (COREG) 25 MG tablet Take 25 mg by mouth 2 (two) times daily with a meal.    . ferrous sulfate 325 (65 FE) MG tablet Take 325 mg by mouth daily with breakfast.    . folic acid (FOLVITE) 1 MG tablet Take 1 tablet (1 mg total) by mouth daily. 30 tablet 0  . furosemide (LASIX) 20 MG tablet Take 20 mg by mouth daily.   3  . levETIRAcetam (KEPPRA) 500 MG tablet Take 1 tablet (500 mg total) by mouth 2 (two) times daily.    . magnesium oxide (MAG-OX) 400 MG tablet Take 400 mg by mouth daily.    . multivitamin (PROSIGHT) TABS tablet Take 1 tablet by mouth daily. 30 each 0  . NUTRITIONAL SUPPLEMENT LIQD Take 120 mLs by mouth daily. MedPass    . pantoprazole (PROTONIX) 40 MG tablet Take 1 tablet (40 mg total) by mouth daily. 30 tablet 0  . potassium chloride SA (KLOR-CON M15) 15 MEQ tablet Take 15 mEq by mouth daily.    . rivaroxaban (XARELTO) 20 MG TABS tablet Take 1 tablet (20 mg total) by mouth daily with supper. 30 tablet   . thiamine 100 MG tablet Take 1 tablet (100 mg total) by mouth daily. 30 tablet 0   No current facility-administered medications for this visit.     PAST MEDICAL HISTORY: Past Medical History:  Diagnosis Date  . Alcohol abuse   . Anemia   . Anoxic brain injury ([place])   . Chronic pulmonary edema   . CVA (cerebral vascular accident) ([place])   . Dysphagia   . Gout   . Hemiplegia affecting left dominant side ([place])   . History of stroke   . Hyperlipemia   . Hypertension   . Seizures ([place])   . Thrombocytopenia ([place])     PAST SURGICAL HISTORY: Past  Surgical History:  Procedure Laterality Date  . lymph nodes     biopsy - benign  . RADIOLOGY WITH ANESTHESIA N/A 07/17/2015   Procedure: RADIOLOGY WITH ANESTHESIA;  Surgeon: Medication Radiologist, MD;  Location: [facility];  Service: Radiology;  Laterality: N/A;    FAMILY HISTORY: Family History  Problem Relation Age of Onset  . Aneurysm Sister   . Clotting disorder Brother   . Aneurysm Mother   . Heart attack Father     SOCIAL HISTORY: Social History   Socioeconomic History  . Marital status: Married    Spouse name: Not on file  . Number of children: 2  . Years of education: 11th grade  . Highest education level: Not on file  Occupational History  . Occupation: Retired  Scientific laboratory technician  . Emergency planning/management officer  strain: Not on file  . Food insecurity:    Worry: Not on file    Inability: Not on file  . Transportation needs:    Medical: Not on file    Non-medical: Not on file  Tobacco Use  . Smoking status: Former Smoker    Types: Cigars  . Smokeless tobacco: Never Used  Substance and Sexual Activity  . Alcohol use: Not Currently    Comment: History of alcohol abuse  . Drug use: Not Currently    Types: Marijuana  . Sexual activity: Yes  Lifestyle  . Physical activity:    Days per week: Not on file    Minutes per session: Not on file  . Stress: Not on file  Relationships  . Social connections:    Talks on phone: Not on file    Gets together: Not on file    Attends religious service: Not on file    Active member of club or organization: Not on file    Attends meetings of clubs or organizations: Not on file    Relationship status: Not on file  . Intimate partner violence:    Fear of current or ex partner: Not on file    Emotionally abused: Not on file    Physically abused: Not on file    Forced sexual activity: Not on file  Other Topics Concern  . Not on file  Social History Narrative   He is currently in rehab following a fall.   He is hoping to be discharged home  soon.      Lives with his wife at home.   Right-handed.   Drinks decaf coffee     PHYSICAL EXAM   Vitals:   06/29/18 1459  BP: 127/74  Pulse: 65    Not recorded      There is no height or weight on file to calculate BMI.  PHYSICAL EXAMNIATION:  Gen: NAD, conversant, well nourised, obese, well groomed                     Cardiovascular: Regular rate rhythm, no peripheral edema, warm, nontender. Eyes: Conjunctivae clear without exudates or hemorrhage Neck: Supple, no carotid bruits. Pulmonary: Clear to auscultation bilaterally   NEUROLOGICAL EXAM:  MENTAL STATUS: Speech cognition: Dysarthria, following commands, rely on his family to provide history.   CRANIAL NERVES: CN II:  Pupils are round equal and briskly reactive to light.  Extinction of left visual field upon double spontaneous stimulation CN III, IV, VI: extraocular movement are normal. No ptosis. CN V: Facial sensation is intact to pinprick in all 3 divisions bilaterally. Corneal responses are intact.  CN VII: Left lower face weakness CN VIII: Hearing is normal to rubbing fingers CN IX, X: Palate elevates symmetrically. Phonation is normal. CN XI: Head turning and shoulder shrug are intact CN XII: Tongue is midline with normal movements and no atrophy.  MOTOR: Spastic left hemiparesis, is able to move left arm, left leg against gravity,  REFLEXES: Hyperreflexia on the left upper and lower extremity  SENSORY: Intact to light touch, pinprick, positional sensation and vibratory sensation are intact in fingers and toes.  COORDINATION: No limp, or truncal ataxia noted,  GAIT/STANCE: Nonambulatory,  DIAGNOSTIC DATA (LABS, IMAGING, TESTING) - I reviewed patient records, labs, notes, testing and imaging myself where available.   ASSESSMENT AND PLAN  Teodoro Jeffreys is a 67 y.o. male   History of right parietal lobe infarction, with residual spastic left hemiparesis History of heavy  alcohol abuse, last use  was on June 07, 2018, he developed delirium alcohol withdrawal during his hospital stay. Partial status epilepticus  EEG,  Increase Keppra to 500 mg in the morning/1000 mg every night,  Call clinic for recurrent body jerking spells,   Marcial Pacas, M.D. Ph.D.  [facility] [address], [place], [place] [address] Ph: [phone] Fax: [phone]  CC:  Hendricks Limes, MD, Mitchell Beard, MD

## 2018-07-04 ENCOUNTER — Ambulatory Visit: Payer: Medicare Other | Admitting: Neurology

## 2018-07-04 ENCOUNTER — Ambulatory Visit (INDEPENDENT_AMBULATORY_CARE_PROVIDER_SITE_OTHER): Payer: PRIVATE HEALTH INSURANCE | Admitting: Neurology

## 2018-07-04 DIAGNOSIS — I69354 Hemiplegia and hemiparesis following cerebral infarction affecting left non-dominant side: Secondary | ICD-10-CM

## 2018-07-04 DIAGNOSIS — R569 Unspecified convulsions: Secondary | ICD-10-CM | POA: Diagnosis not present

## 2018-07-07 NOTE — Procedures (Signed)
   HISTORY: 67 year old male, history of seizure,  TECHNIQUE:  16 channel EEG was performed based on standard 10-16 international system. One channel was dedicated to EKG, which has demonstrates normal sinus rhythm of 72 beats per minutes.  Upon awakening, the posterior background activity was mildly dysrhythmic, with intermittent slowing in the theta range, reactive to eye opening and closure.  There was no evidence of epileptiform discharge.  There are frequent muscle artifact.  Photic stimulation was performed, which induced a symmetric photic driving.  Hyperventilation was performed, there was no abnormality elicit.  No sleep was achieved.  CONCLUSION: This is mild EEG.  There is evidence of intermittent mild slowing of background activity indicating mild bihemisphere malfunction, there is no electrodiagnostic evidence of epileptiform discharge.  Marcial Pacas, M.D. Ph.D.  [facility] [place], [place] [address] Phone: [phone] Fax:      [phone]

## 2018-07-19 ENCOUNTER — Non-Acute Institutional Stay (SKILLED_NURSING_FACILITY): Payer: Medicare Other | Admitting: Adult Health

## 2018-07-19 ENCOUNTER — Encounter: Payer: Self-pay | Admitting: Adult Health

## 2018-07-19 DIAGNOSIS — B370 Candidal stomatitis: Secondary | ICD-10-CM | POA: Diagnosis not present

## 2018-07-19 DIAGNOSIS — N183 Chronic kidney disease, stage 3 unspecified: Secondary | ICD-10-CM

## 2018-07-19 DIAGNOSIS — I5042 Chronic combined systolic (congestive) and diastolic (congestive) heart failure: Secondary | ICD-10-CM

## 2018-07-19 DIAGNOSIS — Z8673 Personal history of transient ischemic attack (TIA), and cerebral infarction without residual deficits: Secondary | ICD-10-CM | POA: Diagnosis not present

## 2018-07-19 DIAGNOSIS — I10 Essential (primary) hypertension: Secondary | ICD-10-CM | POA: Diagnosis not present

## 2018-07-19 DIAGNOSIS — R569 Unspecified convulsions: Secondary | ICD-10-CM | POA: Diagnosis not present

## 2018-07-19 DIAGNOSIS — D631 Anemia in chronic kidney disease: Secondary | ICD-10-CM

## 2018-07-19 NOTE — Progress Notes (Signed)
Location:  [place] Room Number: 664 Place of Service:  SNF (31) Provider:  Durenda Age, NP  Patient Care Team: [name], MD as PCP - General (Family Medicine) Hendricks Limes, MD as Consulting Physician (Internal Medicine)  Extended Emergency Contact Information Primary Emergency Contact: Liddell,Shirley Address: [address]          [place] [address] [place] Phone: [phone] Mobile Phone: [phone] Relation: Spouse  Code Status:  Full Code  Goals of care: Advanced Directive information Advanced Directives 06/17/2018  Does Patient Have a Medical Advance Directive? No  Type of Advance Directive -  Copy of [place] in Chart? -  Would patient like information on creating a medical advance directive? -     Chief Complaint  Patient presents with  . Medical Management of Chronic Issues    Routine [facility] visit    HPI:  Pt is a 67 y.o. male seen today for medical management of chronic diseases.  He is a short-term rehabilitation resident of [facility].  He has a PMH of tobacco/alcohol/cannabis abuse, chronic combined diastolic/systolic heart failure, hypertension, dyslipidemia, and history of pancreatitis. He was seen in the room today. He was noted to have thick whitish coating on his tongue. He denies any pain.      Past Medical History:  Diagnosis Date  . Alcohol abuse   . Anemia   . Anoxic brain injury ([place])   . Chronic pulmonary edema   . CVA (cerebral vascular accident) ([place])   . Dysphagia   . Gout   . Hemiplegia affecting left dominant side ([place])   . History of stroke   . Hyperlipemia   . Hypertension   . Seizures ([place])   . Thrombocytopenia ([place])    Past Surgical History:  Procedure Laterality Date  . lymph nodes     biopsy - benign  . RADIOLOGY WITH ANESTHESIA N/A 07/17/2015   Procedure: RADIOLOGY WITH ANESTHESIA;  Surgeon: Medication  Radiologist, MD;  Location: [place];  Service: Radiology;  Laterality: N/A;    Allergies  Allergen Reactions  . Hydrochlorothiazide     REACTION: precipitates gout  . Pseudoephedrine     REACTION: rash    Outpatient Encounter Medications as of 07/19/2018  Medication Sig  . acetaminophen (TYLENOL) 325 MG tablet Take 2 tablets (650 mg total) by mouth every 6 (six) hours as needed for mild pain, moderate pain or fever (or Fever >/= 101).  Marland Kitchen amLODipine (NORVASC) 10 MG tablet Take 10 mg by mouth daily.  Marland Kitchen atorvastatin (LIPITOR) 40 MG tablet Take 1 tablet (40 mg total) by mouth daily at 6 PM.  . calcium-vitamin D (OSCAL WITH D) 500-200 MG-UNIT tablet Take 1 tablet by mouth 2 (two) times daily.  . carvedilol (COREG) 25 MG tablet Take 25 mg by mouth 2 (two) times daily with a meal.  . ferrous sulfate 325 (65 FE) MG tablet Take 325 mg by mouth daily with breakfast.  . folic acid (FOLVITE) 1 MG tablet Take 1 tablet (1 mg total) by mouth daily.  . furosemide (LASIX) 20 MG tablet Take 20 mg by mouth daily.   Marland Kitchen levETIRAcetam (KEPPRA) 500 MG tablet One po qam, 2 tabs po qhs.  . magnesium oxide (MAG-OX) 400 MG tablet Take 400 mg by mouth daily.  . multivitamin (PROSIGHT) TABS tablet Take 1 tablet by mouth daily.  Marland Kitchen NUTRITIONAL SUPPLEMENT LIQD Take 120 mLs by mouth 2 (two) times daily.  MedPass  . pantoprazole (PROTONIX) 40 MG tablet Take 1 tablet (40 mg total) by mouth daily.  . rivaroxaban (XARELTO) 20 MG TABS tablet Take 1 tablet (20 mg total) by mouth daily with supper.  . thiamine 100 MG tablet Take 1 tablet (100 mg total) by mouth daily.  . [DISCONTINUED] potassium chloride SA (KLOR-CON M15) 15 MEQ tablet Take 15 mEq by mouth daily.   No facility-administered encounter medications on file as of 07/19/2018.     Review of Systems  GENERAL: No change in appetite, no fatigue, no weight changes, no fever, chills or weakness MOUTH and THROAT: Denies oral discomfort, gingival pain or  bleeding RESPIRATORY: no cough, SOB, DOE, wheezing, hemoptysis CARDIAC: No chest pain, edema or palpitations GI: No abdominal pain, diarrhea, constipation, heart burn, nausea or vomiting GU: Denies dysuria, frequency, hematuria, incontinence, or discharge PSYCHIATRIC: Denies feelings of depression or anxiety. No report of hallucinations, insomnia, paranoia, or agitation    Immunization History  Administered Date(s) Administered  . Pneumococcal Polysaccharide-23 07/11/2015  . Td 07/28/1997   Pertinent  Health Maintenance Due  Topic Date Due  . COLONOSCOPY  11/08/1999  . PNA vac Low Risk Adult (2 of 2 - PCV13) 07/10/2016  . INFLUENZA VACCINE  04/27/2018   Fall Risk  04/27/2018 05/24/2016  Falls in the past year? Yes No  Comment Emmi Telephone Survey: data to providers prior to load Emmi Telephone Survey: data to providers prior to load  Number falls in past yr: 2 or more -  Comment Emmi Telephone Survey Actual Response = 2 -  Injury with Fall? No -     Vitals:   07/19/18 1454  BP: 103/74  Pulse: 84  Resp: 18  Temp: 99.2 F (37.3 C)  TempSrc: Oral  SpO2: 94%  Weight: 172 lb 9.6 oz (78.3 kg)  Height: 5\' 8"  (1.727 m)   Body mass index is 26.24 kg/m.  Physical Exam  GENERAL APPEARANCE: Well nourished. In no acute distress. Normal body habitus SKIN:  Skin is warm and dry.  MOUTH and THROAT: Lips are without lesions. Oral mucosa is moist and without lesions. Tongue is normal in shape, size, and color and without lesions RESPIRATORY: Breathing is even & unlabored, BS CTAB CARDIAC: RRR, no murmur,no extra heart sounds, LLE trace edema GI: Abdomen soft, normal BS, no masses, no tenderness EXTREMITIES;  Able to move X 4 extremities NEUROLOGICAL: There is no tremor. Speech is clear. PSYCHIATRIC: Alert and oriented X 3. Affect and behavior are appropriate  Labs reviewed: Recent Labs    06/10/18 0951 06/11/18 1729 06/13/18 0500 06/13/18 1053 06/17/18 1140  06/21/18  06/26/18 06/27/18  NA 138 139 139 140 139   < > 142 141 141  K 4.5 4.1 5.9* 4.0 4.7   < > 5.0 5.6* 4.7  CL 114* 113* 112* 115* 111  --   --   --   --   CO2 15* 17* 17* 19* 21*  --   --   --   --   GLUCOSE 74 123* 93 105* 105*  --   --   --   --   BUN 8 11 9 9 10    < > 14 16 22*  CREATININE 1.58* 1.64* 1.61* 1.64* 1.90*   < > 1.6* 1.6* 1.6*  CALCIUM 7.7* 8.0* 8.2* 8.0* 8.6*  --   --   --   --   MG 1.2* 1.7 1.7  --   --   --   --   --   --  PHOS 2.4* 2.8 2.5  --   --   --   --   --   --    < > = values in this interval not displayed.   Recent Labs    06/07/18 1645 06/10/18 0951 06/11/18 1729 06/13/18 0500 06/21/18  AST 44*  --   --   --  36  ALT 11  --   --   --  15  ALKPHOS 92  --   --   --  67  BILITOT 1.0  --   --   --   --   PROT 7.1  --   --   --   --   ALBUMIN 3.2* 2.6* 2.5* 2.3*  --    Recent Labs    06/07/18 1645  06/13/18 0500 06/17/18 1140 06/19/18 06/21/18 06/26/18  WBC 5.9  --  6.8 3.6* 6.1 8.7 6.9  NEUTROABS 4.5  --  4.7 2.0 4 6 5   HGB 12.8*   < > 9.3* 8.7* 9.1* 8.7* 8.8*  HCT 42.0   < > 28.7* 28.2* 27* 26* 26*  MCV 101.0*  --  96.3 97.2  --   --   --   PLT 164  --  160 231 252 238 294   < > = values in this interval not displayed.    Lab Results  Component Value Date   HGBA1C 5.2 06/08/2018   Lab Results  Component Value Date   CHOL 165 06/08/2018   HDL 86 06/08/2018   LDLCALC 62 06/08/2018   TRIG 87 06/08/2018   CHOLHDL 1.9 06/08/2018    Assessment/Plan  1. Oral candida -tongue has whitish coating, start nystatin 100,000 units/mL 5 mL swish and spit 4 times daily x2 weeks, oral care daily   2. History of CVA (cerebrovascular accident) -stable, continue Xarelto 20 mg 1 tab daily, Norvasc 10 mg 1 tab daily, Carvedilol 25 mg BID    3. Seizures ([place]) - no seizures, continue Keppra 1,000 1 tab Q HS   4. Anemia due to stage 3 chronic kidney disease (HCC) -continue ferrous sulfate 325 mg 1 tab daily Lab Results  Component Value Date   HGB 8.8  (A) 06/26/2018    5. Chronic combined systolic and diastolic CHF (congestive heart failure) (HCC) - stable, continue Lasix 20 mg 1 tab daily and Carvedilol 25 mg BID   6. Essential  Hypertension -  Well-controlled, continue Norvasc 10 mg 1 tab daily    Family/ staff Communication: Discussed plan of care with resident.  Labs/tests ordered:  None  Goals of care:   Short-term rehabilitation.   Durenda Age, NP [facility] [phone] (Monday-Friday 8:00 a.m. - 5:00 p.m.) [phone] (after hours)

## 2018-07-21 DIAGNOSIS — F04 Amnestic disorder due to known physiological condition: Secondary | ICD-10-CM | POA: Diagnosis not present

## 2018-08-01 ENCOUNTER — Encounter: Payer: Self-pay | Admitting: Adult Health

## 2018-08-01 ENCOUNTER — Non-Acute Institutional Stay (SKILLED_NURSING_FACILITY): Payer: Medicare Other | Admitting: Adult Health

## 2018-08-01 DIAGNOSIS — I5042 Chronic combined systolic (congestive) and diastolic (congestive) heart failure: Secondary | ICD-10-CM | POA: Diagnosis not present

## 2018-08-01 DIAGNOSIS — I69354 Hemiplegia and hemiparesis following cerebral infarction affecting left non-dominant side: Secondary | ICD-10-CM

## 2018-08-01 DIAGNOSIS — R569 Unspecified convulsions: Secondary | ICD-10-CM

## 2018-08-01 DIAGNOSIS — Z8719 Personal history of other diseases of the digestive system: Secondary | ICD-10-CM | POA: Diagnosis not present

## 2018-08-01 DIAGNOSIS — F1011 Alcohol abuse, in remission: Secondary | ICD-10-CM | POA: Diagnosis not present

## 2018-08-01 DIAGNOSIS — D649 Anemia, unspecified: Secondary | ICD-10-CM

## 2018-08-01 DIAGNOSIS — Z8673 Personal history of transient ischemic attack (TIA), and cerebral infarction without residual deficits: Secondary | ICD-10-CM | POA: Diagnosis not present

## 2018-08-01 DIAGNOSIS — I10 Essential (primary) hypertension: Secondary | ICD-10-CM | POA: Diagnosis not present

## 2018-08-01 MED ORDER — MAGNESIUM OXIDE 400 MG PO TABS: 400.0000 mg | ORAL_TABLET | Freq: Every day | ORAL | 0 refills | Status: DC

## 2018-08-01 MED ORDER — ATORVASTATIN CALCIUM 40 MG PO TABS: 40.0000 mg | ORAL_TABLET | Freq: Every day | ORAL | Status: DC

## 2018-08-01 MED ORDER — AMLODIPINE BESYLATE 10 MG PO TABS: 10.0000 mg | ORAL_TABLET | Freq: Every day | ORAL | 0 refills | Status: DC

## 2018-08-01 MED ORDER — FERROUS SULFATE 325 (65 FE) MG PO TABS: 325.0000 mg | ORAL_TABLET | Freq: Every day | ORAL | 0 refills | Status: DC

## 2018-08-01 MED ORDER — FOLIC ACID 1 MG PO TABS: 1.0000 mg | ORAL_TABLET | Freq: Every day | ORAL | 0 refills | Status: DC

## 2018-08-01 MED ORDER — RIVAROXABAN 20 MG PO TABS: 20.0000 mg | ORAL_TABLET | Freq: Every day | ORAL | 0 refills | Status: DC

## 2018-08-01 MED ORDER — FUROSEMIDE 20 MG PO TABS: 20.0000 mg | ORAL_TABLET | Freq: Every day | ORAL | 0 refills | Status: DC

## 2018-08-01 MED ORDER — THIAMINE HCL 100 MG PO TABS: 100.0000 mg | ORAL_TABLET | Freq: Every day | ORAL | 0 refills | Status: DC

## 2018-08-01 MED ORDER — CALCIUM CARBONATE-VITAMIN D 500-200 MG-UNIT PO TABS: 1.0000 | ORAL_TABLET | Freq: Two times a day (BID) | ORAL | 0 refills | Status: DC

## 2018-08-01 MED ORDER — PANTOPRAZOLE SODIUM 40 MG PO TBEC: 40.0000 mg | DELAYED_RELEASE_TABLET | Freq: Every day | ORAL | 0 refills | Status: DC

## 2018-08-01 MED ORDER — LEVETIRACETAM 500 MG PO TABS: ORAL_TABLET | ORAL | 0 refills | Status: DC

## 2018-08-01 MED ORDER — CARVEDILOL 25 MG PO TABS: 25.0000 mg | ORAL_TABLET | Freq: Two times a day (BID) | ORAL | 0 refills | Status: DC

## 2018-08-01 NOTE — Progress Notes (Signed)
Location:  [place] Room Number: 219-A Place of Service:  SNF (31) Provider:  Durenda Age, NP  Patient Care Team: [name], MD as PCP - General (Family Medicine) Hendricks Limes, MD as Consulting Physician (Internal Medicine)  Extended Emergency Contact Information Primary Emergency Contact: Massar,Shirley Address: [address]          [place] [address] [place] Phone: [phone] Mobile Phone: [phone] Relation: Spouse  Code Status:  Full Code  Goals of care: Advanced Directive information Advanced Directives 06/17/2018  Does Patient Have a Medical Advance Directive? No  Type of Advance Directive -  Copy of [place] in Chart? -  Would patient like information on creating a medical advance directive? -     Chief Complaint  Patient presents with  . Discharge Note    Patient is discharging 08/02/18    HPI:  Pt is a 67 y.o. male seen today for a discharge visit.  He is discharging to home on 08/02/18 with home health PT and OT.    He has been admitted to [facility] on 06/14/18 after a recent hospitalization due to CVA and seizure.  He was found by his wife unresponsive, shaking with eyes rolled in the back of his head and foaming at the mouth.  EMS was called and had seizure and route to the hospital.  MRI of the brain was negative for acute infarct, which revealed chronic infarct right parietal lobe, mild chronic microvascular ischemic change in the white matter.  MRA head was negative.  Neurology was consulted.  He, also, suffered from acute delirium which was thought to be due to [place]' syndrome, and was started on Seroquel.  Seroquel has since been discontinued due to behavior improvement. He has a PMH of tobacco/cannabis/alcohol abuse, chronic combined diastolic/systolic heart failure, HTN, dyslipidemia, and history of pancreatitis.    He went to the hospital on  06/17/18 for low grade fever, and was diagnosed with anemia due to GI blood loss. Stool occult blood was positive, Hgb 8.7. Wife was concerned of seizure activity, and was noted to have repeated puffing out his cheek periodically. He was recommended to have Augmentin for potential aspiration.  Patient was admitted to this facility for short-term rehabilitation after the patient's recent hospitalization.  Patient has completed SNF rehabilitation and therapy has cleared the patient for discharge.    Past Medical History:  Diagnosis Date  . Alcohol abuse   . Anemia   . Anoxic brain injury ([place])   . Chronic pulmonary edema   . CVA (cerebral vascular accident) ([place])   . Dysphagia   . Gout   . Hemiplegia affecting left dominant side ([place])   . History of stroke   . Hyperlipemia   . Hypertension   . Seizures ([place])   . Thrombocytopenia ([place])    Past Surgical History:  Procedure Laterality Date  . lymph nodes     biopsy - benign  . RADIOLOGY WITH ANESTHESIA N/A 07/17/2015   Procedure: RADIOLOGY WITH ANESTHESIA;  Surgeon: Medication Radiologist, MD;  Location: [place];  Service: Radiology;  Laterality: N/A;    Allergies  Allergen Reactions  . Hydrochlorothiazide     REACTION: precipitates gout  . Pseudoephedrine     REACTION: rash    Outpatient Encounter Medications as of 08/01/2018  Medication Sig  . acetaminophen (TYLENOL) 325 MG tablet Take 2 tablets (650 mg total) by mouth every 6 (six) hours as needed for  mild pain, moderate pain or fever (or Fever >/= 101).  Marland Kitchen amLODipine (NORVASC) 10 MG tablet Take 10 mg by mouth daily.  Marland Kitchen atorvastatin (LIPITOR) 40 MG tablet Take 1 tablet (40 mg total) by mouth daily at 6 PM.  . calcium-vitamin D (OSCAL WITH D) 500-200 MG-UNIT tablet Take 1 tablet by mouth 2 (two) times daily.  . carvedilol (COREG) 25 MG tablet Take 25 mg by mouth 2 (two) times daily with a meal.  . ferrous sulfate 325 (65 FE) MG tablet Take 325 mg by mouth daily with breakfast.    . folic acid (FOLVITE) 1 MG tablet Take 1 tablet (1 mg total) by mouth daily.  . furosemide (LASIX) 20 MG tablet Take 20 mg by mouth daily.   Marland Kitchen levETIRAcetam (KEPPRA) 500 MG tablet One po qam, 2 tabs po qhs.  . magnesium oxide (MAG-OX) 400 MG tablet Take 400 mg by mouth daily.  . multivitamin (PROSIGHT) TABS tablet Take 1 tablet by mouth daily.  Marland Kitchen NUTRITIONAL SUPPLEMENT LIQD Take 120 mLs by mouth 2 (two) times daily. MedPass  . pantoprazole (PROTONIX) 40 MG tablet Take 1 tablet (40 mg total) by mouth daily.  . rivaroxaban (XARELTO) 20 MG TABS tablet Take 1 tablet (20 mg total) by mouth daily with supper.  . thiamine 100 MG tablet Take 1 tablet (100 mg total) by mouth daily.   No facility-administered encounter medications on file as of 08/01/2018.     Review of Systems  GENERAL: No change in appetite, no fatigue, no weight changes, no fever, chills or weakness MOUTH and THROAT: Denies oral discomfort, gingival pain or bleeding RESPIRATORY: no cough, SOB, DOE, wheezing, hemoptysis CARDIAC: No chest pain, edema or palpitations GI: No abdominal pain, diarrhea, constipation, heart burn, nausea or vomiting PSYCHIATRIC: Denies feelings of depression or anxiety. No report of hallucinations, insomnia, paranoia, or agitation    Immunization History  Administered Date(s) Administered  . Influenza-Unspecified 07/28/2018  . Pneumococcal Polysaccharide-23 07/11/2015  . Td 07/28/1997   Pertinent  Health Maintenance Due  Topic Date Due  . COLONOSCOPY  11/08/1999  . PNA vac Low Risk Adult (2 of 2 - PCV13) 07/10/2016  . INFLUENZA VACCINE  Completed   Fall Risk  04/27/2018 05/24/2016  Falls in the past year? Yes No  Comment Emmi Telephone Survey: data to providers prior to load Emmi Telephone Survey: data to providers prior to load  Number falls in past yr: 2 or more -  Comment Emmi Telephone Survey Actual Response = 2 -  Injury with Fall? No -     Vitals:   08/01/18 0843  BP: 135/70   Pulse: 78  Resp: 20  Temp: (!) 97.3 F (36.3 C)  TempSrc: Oral  SpO2: 94%  Weight: 177 lb 9.6 oz (80.6 kg)  Height: 5\' 8"  (1.727 m)   Body mass index is 27 kg/m.  Physical Exam  GENERAL APPEARANCE: Well nourished. In no acute distress. Normal body habitus SKIN:  Skin is warm and dry.  MOUTH and THROAT: Lips are without lesions. Oral mucosa is moist and without lesions.  RESPIRATORY: Breathing is even & unlabored, BS CTAB CARDIAC: RRR, no murmur,no extra heart sounds, no edema GI: Abdomen soft, normal BS, no masses, no tenderness EXTREMITIES:  Able to move X 4 extremities NEUROLOGICAL: There is no tremor. Speech is clear. Alert and oriented X 3. Left-sided weakness, left neglect. PSYCHIATRIC:  Affect and behavior are appropriate   Labs reviewed: Recent Labs    06/10/18 0951 06/11/18 1729  06/13/18 0500 06/13/18 1053 06/17/18 1140  06/21/18 06/26/18 06/27/18  NA 138 139 139 140 139   < > 142 141 141  K 4.5 4.1 5.9* 4.0 4.7   < > 5.0 5.6* 4.7  CL 114* 113* 112* 115* 111  --   --   --   --   CO2 15* 17* 17* 19* 21*  --   --   --   --   GLUCOSE 74 123* 93 105* 105*  --   --   --   --   BUN 8 11 9 9 10    < > 14 16 22*  CREATININE 1.58* 1.64* 1.61* 1.64* 1.90*   < > 1.6* 1.6* 1.6*  CALCIUM 7.7* 8.0* 8.2* 8.0* 8.6*  --   --   --   --   MG 1.2* 1.7 1.7  --   --   --   --   --   --   PHOS 2.4* 2.8 2.5  --   --   --   --   --   --    < > = values in this interval not displayed.   Recent Labs    06/07/18 1645 06/10/18 0951 06/11/18 1729 06/13/18 0500 06/21/18  AST 44*  --   --   --  36  ALT 11  --   --   --  15  ALKPHOS 92  --   --   --  67  BILITOT 1.0  --   --   --   --   PROT 7.1  --   --   --   --   ALBUMIN 3.2* 2.6* 2.5* 2.3*  --    Recent Labs    06/07/18 1645  06/13/18 0500 06/17/18 1140 06/19/18 06/21/18 06/26/18  WBC 5.9  --  6.8 3.6* 6.1 8.7 6.9  NEUTROABS 4.5  --  4.7 2.0 4 6 5   HGB 12.8*   < > 9.3* 8.7* 9.1* 8.7* 8.8*  HCT 42.0   < > 28.7* 28.2* 27*  26* 26*  MCV 101.0*  --  96.3 97.2  --   --   --   PLT 164  --  160 231 252 238 294   < > = values in this interval not displayed.    Lab Results  Component Value Date   HGBA1C 5.2 06/08/2018   Lab Results  Component Value Date   CHOL 165 06/08/2018   HDL 86 06/08/2018   LDLCALC 62 06/08/2018   TRIG 87 06/08/2018   CHOLHDL 1.9 06/08/2018     Assessment/Plan  1. Seizure ([place]) -  - levETIRAcetam (KEPPRA) 500 MG tablet; One po qam, 2 tabs po qhs.  Dispense: 90 tablet; Refill: 0  2. History of CVA (cerebrovascular accident) - atorvastatin (LIPITOR) 40 MG tablet; Take 1 tablet (40 mg total) by mouth daily at 6 PM. - carvedilol (COREG) 25 MG tablet; Take 1 tablet (25 mg total) by mouth 2 (two) times daily with a meal.  Dispense: 60 tablet; Refill: 0 - rivaroxaban (XARELTO) 20 MG TABS tablet; Take 1 tablet (20 mg total) by mouth daily with supper.  Dispense: 30 tablet; Refill: 0  3. 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 Chronic combined systolic and diastolic CHF (congestive heart failure) (HCC) - carvedilol (COREG) 25 MG tablet; Take 1 tablet (25 mg total)  by mouth 2 (two) times daily with a meal.  Dispense: 60 tablet; Refill: 0 - furosemide (LASIX) 20 MG tablet; Take 1 tablet (20 mg total) by mouth daily.  Dispense: 30 tablet; Refill: 0  5. History of GI bleed - pantoprazole (PROTONIX) 40 MG tablet; Take 1 tablet (40 mg total) by mouth daily.  Dispense: 30 tablet; Refill: 0  6. Normochromic normocytic anemia - ferrous sulfate 325 (65 FE) MG tablet; Take 1 tablet (325 mg total) by mouth daily with breakfast.  Dispense: 30 tablet; Refill: 0   7. HYPERTENSION, BENIGN SYSTEMIC - amLODipine (NORVASC) 10 MG tablet; Take 1 tablet (10 mg total) by mouth daily.  Dispense: 30 tablet; Refill: 0 - carvedilol (COREG) 25 MG tablet; Take 1 tablet (25 mg total)  by mouth 2 (two) times daily with a meal.  Dispense: 60 tablet; Refill: 0  8. History of alcohol abuse - folic acid (FOLVITE) 1 MG tablet; Take 1 tablet (1 mg total) by mouth daily.  Dispense: 30 tablet; Refill: 0 - magnesium oxide (MAG-OX) 400 MG tablet; Take 1 tablet (400 mg total) by mouth daily.  Dispense: 30 tablet; Refill: 0 - thiamine 100 MG tablet; Take 1 tablet (100 mg total) by mouth daily.  Dispense: 30 tablet; Refill: 0    I have filled out patient's discharge paperwork and written prescriptions.  Patient will receive home health PT and OT.  DME provided:  Rolling walker  Total discharge time: Greater than 30 minutes Greater than 50% was spent in counseling and coordination of care.   Discharge time involved coordination of the discharge process with social worker, nursing staff and therapy department. Medical justification for home health services/DME verified.    Durenda Age, NP [facility] [phone] (Monday-Friday 8:00 a.m. - 5:00 p.m.) [phone] (after hours)

## 2018-08-07 DIAGNOSIS — H401131 Primary open-angle glaucoma, bilateral, mild stage: Secondary | ICD-10-CM | POA: Diagnosis not present

## 2018-08-07 DIAGNOSIS — H25813 Combined forms of age-related cataract, bilateral: Secondary | ICD-10-CM | POA: Diagnosis not present

## 2018-08-08 DIAGNOSIS — N183 Chronic kidney disease, stage 3 (moderate): Secondary | ICD-10-CM | POA: Diagnosis not present

## 2018-08-08 DIAGNOSIS — Z7901 Long term (current) use of anticoagulants: Secondary | ICD-10-CM | POA: Diagnosis not present

## 2018-08-08 DIAGNOSIS — I69354 Hemiplegia and hemiparesis following cerebral infarction affecting left non-dominant side: Secondary | ICD-10-CM | POA: Diagnosis not present

## 2018-08-08 DIAGNOSIS — I10 Essential (primary) hypertension: Secondary | ICD-10-CM | POA: Diagnosis not present

## 2018-08-08 DIAGNOSIS — R569 Unspecified convulsions: Secondary | ICD-10-CM | POA: Diagnosis not present

## 2018-08-08 DIAGNOSIS — D649 Anemia, unspecified: Secondary | ICD-10-CM | POA: Diagnosis not present

## 2018-08-16 DIAGNOSIS — N189 Chronic kidney disease, unspecified: Secondary | ICD-10-CM | POA: Diagnosis not present

## 2018-08-16 DIAGNOSIS — Z79899 Other long term (current) drug therapy: Secondary | ICD-10-CM | POA: Diagnosis not present

## 2018-08-16 DIAGNOSIS — M150 Primary generalized (osteo)arthritis: Secondary | ICD-10-CM | POA: Diagnosis not present

## 2018-08-16 DIAGNOSIS — M1A09X1 Idiopathic chronic gout, multiple sites, with tophus (tophi): Secondary | ICD-10-CM | POA: Diagnosis not present

## 2018-08-17 ENCOUNTER — Other Ambulatory Visit: Payer: Self-pay

## 2018-08-21 DIAGNOSIS — H2511 Age-related nuclear cataract, right eye: Secondary | ICD-10-CM | POA: Diagnosis not present

## 2018-08-22 ENCOUNTER — Other Ambulatory Visit: Payer: Self-pay | Admitting: Adult Health

## 2018-08-22 DIAGNOSIS — I10 Essential (primary) hypertension: Secondary | ICD-10-CM

## 2018-08-22 DIAGNOSIS — Z8673 Personal history of transient ischemic attack (TIA), and cerebral infarction without residual deficits: Secondary | ICD-10-CM

## 2018-08-22 DIAGNOSIS — F1011 Alcohol abuse, in remission: Secondary | ICD-10-CM

## 2018-08-22 DIAGNOSIS — R569 Unspecified convulsions: Secondary | ICD-10-CM

## 2018-08-22 DIAGNOSIS — Z8719 Personal history of other diseases of the digestive system: Secondary | ICD-10-CM

## 2018-08-22 DIAGNOSIS — D649 Anemia, unspecified: Secondary | ICD-10-CM

## 2018-08-22 DIAGNOSIS — I5042 Chronic combined systolic (congestive) and diastolic (congestive) heart failure: Secondary | ICD-10-CM

## 2018-08-23 ENCOUNTER — Other Ambulatory Visit: Payer: Self-pay | Admitting: Adult Health

## 2018-08-23 DIAGNOSIS — R569 Unspecified convulsions: Secondary | ICD-10-CM

## 2018-08-23 DIAGNOSIS — I10 Essential (primary) hypertension: Secondary | ICD-10-CM

## 2018-08-23 DIAGNOSIS — I5042 Chronic combined systolic (congestive) and diastolic (congestive) heart failure: Secondary | ICD-10-CM

## 2018-08-23 DIAGNOSIS — Z8673 Personal history of transient ischemic attack (TIA), and cerebral infarction without residual deficits: Secondary | ICD-10-CM

## 2018-08-23 DIAGNOSIS — F1011 Alcohol abuse, in remission: Secondary | ICD-10-CM

## 2018-08-25 ENCOUNTER — Other Ambulatory Visit: Payer: Self-pay | Admitting: Adult Health

## 2018-08-25 DIAGNOSIS — I10 Essential (primary) hypertension: Secondary | ICD-10-CM

## 2018-08-25 DIAGNOSIS — F1011 Alcohol abuse, in remission: Secondary | ICD-10-CM

## 2018-08-25 DIAGNOSIS — D649 Anemia, unspecified: Secondary | ICD-10-CM

## 2018-08-28 ENCOUNTER — Other Ambulatory Visit: Payer: Self-pay | Admitting: Adult Health

## 2018-08-28 DIAGNOSIS — I5042 Chronic combined systolic (congestive) and diastolic (congestive) heart failure: Secondary | ICD-10-CM

## 2018-08-28 DIAGNOSIS — R569 Unspecified convulsions: Secondary | ICD-10-CM

## 2018-08-28 DIAGNOSIS — Z8673 Personal history of transient ischemic attack (TIA), and cerebral infarction without residual deficits: Secondary | ICD-10-CM

## 2018-08-28 DIAGNOSIS — I10 Essential (primary) hypertension: Secondary | ICD-10-CM

## 2018-08-29 ENCOUNTER — Encounter: Payer: Self-pay | Admitting: Neurology

## 2018-08-29 ENCOUNTER — Ambulatory Visit (INDEPENDENT_AMBULATORY_CARE_PROVIDER_SITE_OTHER): Payer: Medicare Other | Admitting: Neurology

## 2018-08-29 VITALS — BP 131/77 | HR 67 | Ht 68.0 in | Wt 178.0 lb

## 2018-08-29 DIAGNOSIS — G40909 Epilepsy, unspecified, not intractable, without status epilepticus: Secondary | ICD-10-CM

## 2018-08-29 DIAGNOSIS — G4089 Other seizures: Secondary | ICD-10-CM

## 2018-08-29 DIAGNOSIS — I6300 Cerebral infarction due to thrombosis of unspecified precerebral artery: Secondary | ICD-10-CM

## 2018-08-29 DIAGNOSIS — R569 Unspecified convulsions: Secondary | ICD-10-CM

## 2018-08-29 MED ORDER — LEVETIRACETAM 500 MG PO TABS: ORAL_TABLET | ORAL | 4 refills | Status: DC

## 2018-08-29 NOTE — Progress Notes (Signed)
PATIENT: Mitchell Vitali Sr. DOB: Jun 03, 1951  Chief Complaint  Patient presents with  . Seizures    He is here with his wife, Mitchell [name].  They would like to discuss his EEG.  He is doing better on his increased dose of Keppra.  Denies any seizure-like events.     HISTORICAL  Mitchell Streety Sr. is a 67 year old male, accompanied by his wife, and daughter Mitchell [name] at today's visit, seen in request by his primary care physician Dr. Linna Darner, Darrick Penna, and Dr. [name], MD from his current [facility] for evaluation of seizure-like event, uncontrolled left-sided body jerking, initial evaluation was on July 03, 2018.  I have reviewed and summarized the referring note from the referring physician.  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  On June 07, 2018, he was at home, witnessed by his wife had sudden onset eyes rolling backwards, body jerking, convulsions for 2 minutes, followed by confusion afterwards.  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, MRA of the neck was negative, He also suffered acute delirium from alcohol withdrawal during his hospital stay, improved with Seroquel, he was discharged to rehab his Keppra 500 mg twice a day,  He was discharged to [facility] rehabilitation on June 14, 2018, was taken to the emergency room on June 17, 2018 for recurrent UTI,  Laboratory evaluations, LDL 62, A1c 5.2, Hg 8.7, HCT 26, creat 1.6,  EEG on June 08, 2018 was normal Echocardiogram on June 08, 2018 showed ejection fraction of 30 to 58%, systolic function was moderate to severely reduced, diffuse hypokinesis.  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  He did have confusion following prolonged seizure event, last prolonged jerking episode was few days ago, he is not at much lower level of functional status, came in with wheelchair, was noted to have increased left-sided weakness.  UPDATE Aug 29 2018: He is accompanied by his wife at today's clinical visit, EEG on July 04, 2018 showed mild background slowing, there is no evidence of epileptiform discharge.  He has no recurrent seizure, tolerating Keppra 500/1000 mg.  REVIEW OF SYSTEMS: Full 14 system review of systems performed and notable only for as above All other review of systems were negative.  ALLERGIES: Allergies  Allergen Reactions  . Hydrochlorothiazide     REACTION: precipitates gout  . Pseudoephedrine     REACTION: rash    HOME MEDICATIONS: Current Outpatient Medications  Medication Sig Dispense Refill  . acetaminophen (TYLENOL) 325 MG tablet Take 2 tablets (650 mg total) by mouth every 6 (six) hours as needed for mild pain,  moderate pain or fever (or Fever >/= 101).    Marland Kitchen amLODipine (NORVASC) 10 MG tablet Take 1 tablet (10 mg total) by mouth daily. 30 tablet 0  . atorvastatin (LIPITOR) 40 MG tablet Take 1 tablet (40 mg total) by mouth daily at 6 PM.    . calcium-vitamin D (OSCAL WITH D) 500-200 MG-UNIT tablet Take 1 tablet by mouth 2 (two) times  daily. 60 tablet 0  . carvedilol (COREG) 25 MG tablet Take 1 tablet (25 mg total) by mouth 2 (two) times daily with a meal. 60 tablet 0  . ferrous sulfate 325 (65 FE) MG tablet Take 1 tablet (325 mg total) by mouth daily with breakfast. 30 tablet 0  . folic acid (FOLVITE) 1 MG tablet Take 1 tablet (1 mg total) by mouth daily. 30 tablet 0  . furosemide (LASIX) 20 MG tablet Take 1 tablet (20 mg total) by mouth daily. 30 tablet 0  . levETIRAcetam (KEPPRA) 500 MG tablet One po qam, 2 tabs po qhs. 90 tablet 0  . magnesium oxide (MAG-OX) 400 MG tablet Take 1 tablet (400 mg total) by mouth daily. 30 tablet 0  . multivitamin (PROSIGHT) TABS tablet Take 1 tablet by mouth daily. 30 each 0  . NUTRITIONAL SUPPLEMENT LIQD Take 120 mLs by mouth 2 (two) times daily. MedPass    . pantoprazole (PROTONIX) 40 MG tablet Take 1 tablet (40 mg total) by mouth daily. 30 tablet 0  . rivaroxaban (XARELTO) 20 MG TABS tablet Take 1 tablet (20 mg total) by mouth daily with supper. 30 tablet 0  . thiamine 100 MG tablet Take 1 tablet (100 mg total) by mouth daily. 30 tablet 0   No current facility-administered medications for this visit.     PAST MEDICAL HISTORY: Past Medical History:  Diagnosis Date  . Alcohol abuse   . Anemia   . Anoxic brain injury ([place])   . Chronic pulmonary edema   . CVA (cerebral vascular accident) ([place])   . Dysphagia   . Gout   . Hemiplegia affecting left dominant side ([place])   . History of stroke   . Hyperlipemia   . Hypertension   . Seizures ([place])   . Thrombocytopenia ([place])     PAST SURGICAL HISTORY: Past Surgical History:  Procedure Laterality Date  . lymph nodes     biopsy - benign  . RADIOLOGY WITH ANESTHESIA N/A 07/17/2015   Procedure: RADIOLOGY WITH ANESTHESIA;  Surgeon: Medication Radiologist, MD;  Location: [facility];  Service: Radiology;  Laterality: N/A;    FAMILY HISTORY: Family History  Problem Relation Age of Onset  . Aneurysm Sister   . Clotting disorder Brother   .  Aneurysm Mother   . Heart attack Father     SOCIAL HISTORY: Social History   Socioeconomic History  . Marital status: Married    Spouse name: Not on file  . Number of children: 2  . Years of education: 11th grade  . Highest education level: Not on file  Occupational History  . Occupation: Retired  Scientific laboratory technician  . Financial resource strain: Not on file  . Food insecurity:    Worry: Not on file    Inability: Not on file  . Transportation needs:    Medical: Not on file    Non-medical: Not on file  Tobacco Use  . Smoking status: Former Smoker    Types: Cigars  . Smokeless tobacco: Never Used  Substance and Sexual Activity  . Alcohol use: Not Currently  Comment: History of alcohol abuse  . Drug use: Not Currently    Types: Marijuana  . Sexual activity: Yes  Lifestyle  . Physical activity:    Days per week: Not on file    Minutes per session: Not on file  . Stress: Not on file  Relationships  . Social connections:    Talks on phone: Not on file    Gets together: Not on file    Attends religious service: Not on file    Active member of club or organization: Not on file    Attends meetings of clubs or organizations: Not on file    Relationship status: Not on file  . Intimate partner violence:    Fear of current or ex partner: Not on file    Emotionally abused: Not on file    Physically abused: Not on file    Forced sexual activity: Not on file  Other Topics Concern  . Not on file  Social History Narrative   He is currently in rehab following a fall.   He is hoping to be discharged home soon.      Lives with his wife at home.   Right-handed.   Drinks decaf coffee     PHYSICAL EXAM   Vitals:   08/29/18 1502  BP: 131/77  Pulse: 67  Weight: 178 lb (80.7 kg)  Height: 5\' 8"  (1.727 m)    Not recorded      Body mass index is 27.06 kg/m.  PHYSICAL EXAMNIATION:  Gen: NAD, conversant, well nourised, obese, well groomed                       Cardiovascular: Regular rate rhythm, no peripheral edema, warm, nontender. Eyes: Conjunctivae clear without exudates or hemorrhage Neck: Supple, no carotid bruits. Pulmonary: Clear to auscultation bilaterally   NEUROLOGICAL EXAM:  MENTAL STATUS: Speech cognition: Dysarthria, following commands, rely on his family to provide history.   CRANIAL NERVES: CN II:  Pupils are round equal and briskly reactive to light.  Extinction of left visual field upon double spontaneous stimulation CN III, IV, VI: extraocular movement are normal. No ptosis. CN V: Facial sensation is intact to pinprick in all 3 divisions bilaterally. Corneal responses are intact.  CN VII: Left lower face weakness CN VIII: Hearing is normal to rubbing fingers CN IX, X: Palate elevates symmetrically. Phonation is normal. CN XI: Head turning and shoulder shrug are intact CN XII: Tongue is midline with normal movements and no atrophy.  MOTOR: Spastic left hemiparesis, is able to move left arm, left leg against gravity,  REFLEXES: Hyperreflexia on the left upper and lower extremity  SENSORY: Intact to light touch, pinprick, positional sensation and vibratory sensation are intact in fingers and toes.  COORDINATION: No limp, or truncal ataxia noted,  GAIT/STANCE: Nonambulatory,  DIAGNOSTIC DATA (LABS, IMAGING, TESTING) - I reviewed patient records, labs, notes, testing and imaging myself where available.   ASSESSMENT AND PLAN  Mitchell Corti Sr. is a 67 y.o. male   History of right parietal lobe infarction, with residual spastic left hemiparesis History of heavy alcohol abuse, last use was on June 07, 2018, he developed delirium alcohol withdrawal during his hospital stay. Partial status epilepticus  EEG showed background slowing there is no epileptiform discharge,  Continue Keppra to 500 mg in the morning/1000 mg every night,  Return to clinic in 6 months  Mitchell [name], M.D. Ph.D.  [facility] [address], [address]  [place], [place] [address] Ph: [phone] Fax: [phone]  CC:  Hendricks Limes, MD, [name], MD

## 2018-09-09 ENCOUNTER — Other Ambulatory Visit: Payer: Self-pay | Admitting: Adult Health

## 2018-09-09 DIAGNOSIS — Z8719 Personal history of other diseases of the digestive system: Secondary | ICD-10-CM

## 2018-09-12 DIAGNOSIS — I5042 Chronic combined systolic (congestive) and diastolic (congestive) heart failure: Secondary | ICD-10-CM | POA: Diagnosis not present

## 2018-09-12 DIAGNOSIS — I10 Essential (primary) hypertension: Secondary | ICD-10-CM | POA: Diagnosis not present

## 2018-09-12 DIAGNOSIS — R569 Unspecified convulsions: Secondary | ICD-10-CM | POA: Diagnosis not present

## 2018-09-12 DIAGNOSIS — Z7901 Long term (current) use of anticoagulants: Secondary | ICD-10-CM | POA: Diagnosis not present

## 2018-09-12 DIAGNOSIS — Z6827 Body mass index (BMI) 27.0-27.9, adult: Secondary | ICD-10-CM | POA: Diagnosis not present

## 2018-09-12 DIAGNOSIS — N183 Chronic kidney disease, stage 3 (moderate): Secondary | ICD-10-CM | POA: Diagnosis not present

## 2018-09-12 DIAGNOSIS — G8192 Hemiplegia, unspecified affecting left dominant side: Secondary | ICD-10-CM | POA: Diagnosis not present

## 2018-09-13 DIAGNOSIS — M1A09X1 Idiopathic chronic gout, multiple sites, with tophus (tophi): Secondary | ICD-10-CM | POA: Diagnosis not present

## 2018-09-13 DIAGNOSIS — Z79899 Other long term (current) drug therapy: Secondary | ICD-10-CM | POA: Diagnosis not present

## 2018-09-18 DIAGNOSIS — Z0000 Encounter for general adult medical examination without abnormal findings: Secondary | ICD-10-CM | POA: Diagnosis not present

## 2018-11-07 DIAGNOSIS — N183 Chronic kidney disease, stage 3 (moderate): Secondary | ICD-10-CM | POA: Diagnosis not present

## 2018-11-07 DIAGNOSIS — I5042 Chronic combined systolic (congestive) and diastolic (congestive) heart failure: Secondary | ICD-10-CM | POA: Diagnosis not present

## 2018-11-07 DIAGNOSIS — I10 Essential (primary) hypertension: Secondary | ICD-10-CM | POA: Diagnosis not present

## 2018-11-07 DIAGNOSIS — I69354 Hemiplegia and hemiparesis following cerebral infarction affecting left non-dominant side: Secondary | ICD-10-CM | POA: Diagnosis not present

## 2018-11-07 DIAGNOSIS — R569 Unspecified convulsions: Secondary | ICD-10-CM | POA: Diagnosis not present

## 2018-11-19 ENCOUNTER — Other Ambulatory Visit: Payer: Self-pay | Admitting: Adult Health

## 2018-11-19 DIAGNOSIS — Z8673 Personal history of transient ischemic attack (TIA), and cerebral infarction without residual deficits: Secondary | ICD-10-CM

## 2018-11-23 ENCOUNTER — Other Ambulatory Visit: Payer: Self-pay | Admitting: Adult Health

## 2018-11-23 DIAGNOSIS — Z8673 Personal history of transient ischemic attack (TIA), and cerebral infarction without residual deficits: Secondary | ICD-10-CM

## 2018-11-30 DIAGNOSIS — M1A09X1 Idiopathic chronic gout, multiple sites, with tophus (tophi): Secondary | ICD-10-CM | POA: Diagnosis not present

## 2018-11-30 DIAGNOSIS — M150 Primary generalized (osteo)arthritis: Secondary | ICD-10-CM | POA: Diagnosis not present

## 2018-11-30 DIAGNOSIS — M25569 Pain in unspecified knee: Secondary | ICD-10-CM | POA: Diagnosis not present

## 2018-11-30 DIAGNOSIS — N189 Chronic kidney disease, unspecified: Secondary | ICD-10-CM | POA: Diagnosis not present

## 2018-11-30 DIAGNOSIS — Z79899 Other long term (current) drug therapy: Secondary | ICD-10-CM | POA: Diagnosis not present

## 2019-02-05 DIAGNOSIS — R569 Unspecified convulsions: Secondary | ICD-10-CM | POA: Diagnosis not present

## 2019-02-05 DIAGNOSIS — M109 Gout, unspecified: Secondary | ICD-10-CM | POA: Diagnosis not present

## 2019-02-05 DIAGNOSIS — Z7901 Long term (current) use of anticoagulants: Secondary | ICD-10-CM | POA: Diagnosis not present

## 2019-02-05 DIAGNOSIS — I10 Essential (primary) hypertension: Secondary | ICD-10-CM | POA: Diagnosis not present

## 2019-02-05 DIAGNOSIS — Z7189 Other specified counseling: Secondary | ICD-10-CM | POA: Diagnosis not present

## 2019-02-05 DIAGNOSIS — N183 Chronic kidney disease, stage 3 (moderate): Secondary | ICD-10-CM | POA: Diagnosis not present

## 2019-02-05 DIAGNOSIS — I5040 Unspecified combined systolic (congestive) and diastolic (congestive) heart failure: Secondary | ICD-10-CM | POA: Diagnosis not present

## 2019-02-27 ENCOUNTER — Telehealth: Payer: Self-pay | Admitting: *Deleted

## 2019-02-27 NOTE — Telephone Encounter (Signed)
Called pt. Due to current COVID 19 pandemic, our office is severely reducing in office visits until further notice, in order to minimize the risk to our patients and healthcare providers. Unable to get in contact with the patient to convert their office visit with Judson Roch 03/01/2019 into a doxy.me visit. I left a voicemail asking the patient to return my call. Office number was provided.     If patient calls back please convert their office visit into a doxy.me visit.

## 2019-02-28 ENCOUNTER — Ambulatory Visit: Payer: Medicare Other | Admitting: Neurology

## 2019-03-01 ENCOUNTER — Other Ambulatory Visit: Payer: Self-pay

## 2019-03-01 ENCOUNTER — Encounter: Payer: Self-pay | Admitting: Neurology

## 2019-03-01 ENCOUNTER — Ambulatory Visit (INDEPENDENT_AMBULATORY_CARE_PROVIDER_SITE_OTHER): Payer: Medicare Other | Admitting: Neurology

## 2019-03-01 DIAGNOSIS — G40909 Epilepsy, unspecified, not intractable, without status epilepticus: Secondary | ICD-10-CM

## 2019-03-01 NOTE — Telephone Encounter (Signed)
I called and spoke to wife.  They do not have access to camera for VV. Consented to telephone visit. [place] for telephone visit.  Home #.  Both pt and wife to listen in.  [phone].   Pt understands that although there may be some limitations with this type of visit, we will take all precautions to reduce any security or privacy concerns.  Pt understands that this will be treated like an in office visit and we will file with pt's insurance, and there may be a patient responsible charge related to this service.

## 2019-03-01 NOTE — Progress Notes (Signed)
Virtual Visit via Telephone Note  I connected with Mitchell Lechuga Sr. on 03/01/19 at 11:15 AM EDT by telephone and verified that I am speaking with the correct person using two identifiers.   I discussed the limitations, risks, security and privacy concerns of performing an evaluation and management service by telephone and the availability of in person appointments. I also discussed with the patient that there may be a patient responsible charge related to this service. The patient expressed understanding and agreed to proceed.  History of Present Illness: Mitchell Custer. is a 68 year old male, accompanied by his wife, and daughter Mitchell [name] at today's visit, seen in request by his primary care physician Dr. Linna Darner, Darrick Penna, and Dr. [name], MD from his current [facility] for evaluation of seizure-like event, uncontrolled left-sided body jerking, initial evaluation was on July 03, 2018.  I have reviewed and summarized the referring note from the referring physician.  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  On June 07, 2018, he was at home, witnessed by his wife had sudden onset eyes rolling backwards, body jerking, convulsions for 2 minutes, followed by confusion afterwards.  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, MRA of the neck was  negative, He also suffered acute delirium from alcohol withdrawal during his hospital stay, improved with Seroquel, he was discharged to rehab his Keppra 500 mg twice a day,  He was discharged to [facility] rehabilitation on June 14, 2018, was taken to the emergency room on June 17, 2018 for recurrent UTI,  Laboratory evaluations, LDL 62, A1c 5.2, Hg 8.7, HCT 26, creat 1.6,  EEG on June 08, 2018 was normal Echocardiogram on June 08, 2018 showed ejection fraction of 30 to 29%, systolic function was moderate to severely reduced, diffuse hypokinesis.  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  He did have confusion following prolonged seizure event, last prolonged jerking episode was few days ago, he is not at much lower level of functional status, came in with wheelchair, was noted to have increased left-sided weakness.  UPDATE Aug 29 2018: He is accompanied by his wife at today's clinical visit, EEG on July 04, 2018 showed mild background slowing, there is no evidence of epileptiform discharge.  He has no recurrent seizure, tolerating Keppra 500/1000 mg.  Update March 01, 2019 SS: [name], 68 year old male with history of seizure-like events, taking Keppra 500 mg in the morning, 1000 mg in the evening, he lives at home with his wife, he has not had recurrent seizure.  He reports his wife manages his medications, but he could,  he does not drive, denies any new problems or concerns, does report some drowsiness, could be side effect of medication?  Since taking the Keppra, has not had any shaking episodes, has not had any alcohol since July 2019   Observations/Objective:  Telephone call, is alert and oriented, speech is clear, wife helps with history  Assessment and Plan: History of right parietal lobe infarction, with residual spastic left  hemiparesis History of heavy alcohol abuse, last use was on June 07, 2018, he developed delirium alcohol withdrawal during  his hospital stay. Partial status epilepticus Continue Keppra 500 mg in the morning/1000 mg in the evening, reported some drowsiness, will continue to monitor, could be side effect of medication? Call for recurrent seizures  Follow Up Instructions: 6 months 12/10 11:15   I discussed the assessment and treatment plan with the patient. The patient was provided an opportunity to ask questions and all were answered. The patient agreed with the plan and demonstrated an understanding of the instructions.   The patient was advised to call back or seek an in-person evaluation if the symptoms worsen or if the condition fails to improve as anticipated.  I provided 15 minutes of non-face-to-face time during this encounter.  Evangeline Dakin, DNP  [facility] [address], [place], [place] [address] [phone]

## 2019-03-02 NOTE — Progress Notes (Signed)
I have reviewed and agreed above plan. 

## 2019-03-29 DIAGNOSIS — H25812 Combined forms of age-related cataract, left eye: Secondary | ICD-10-CM | POA: Diagnosis not present

## 2019-03-29 DIAGNOSIS — Z961 Presence of intraocular lens: Secondary | ICD-10-CM | POA: Diagnosis not present

## 2019-03-29 DIAGNOSIS — H53462 Homonymous bilateral field defects, left side: Secondary | ICD-10-CM | POA: Diagnosis not present

## 2019-03-29 DIAGNOSIS — H40023 Open angle with borderline findings, high risk, bilateral: Secondary | ICD-10-CM | POA: Diagnosis not present

## 2019-05-07 DIAGNOSIS — L720 Epidermal cyst: Secondary | ICD-10-CM | POA: Diagnosis not present

## 2019-05-08 DIAGNOSIS — Z7901 Long term (current) use of anticoagulants: Secondary | ICD-10-CM | POA: Diagnosis not present

## 2019-05-08 DIAGNOSIS — R569 Unspecified convulsions: Secondary | ICD-10-CM | POA: Diagnosis not present

## 2019-05-08 DIAGNOSIS — G8194 Hemiplegia, unspecified affecting left nondominant side: Secondary | ICD-10-CM | POA: Diagnosis not present

## 2019-05-08 DIAGNOSIS — I10 Essential (primary) hypertension: Secondary | ICD-10-CM | POA: Diagnosis not present

## 2019-05-08 DIAGNOSIS — N183 Chronic kidney disease, stage 3 (moderate): Secondary | ICD-10-CM | POA: Diagnosis not present

## 2019-06-08 DIAGNOSIS — L0390 Cellulitis, unspecified: Secondary | ICD-10-CM | POA: Diagnosis not present

## 2019-06-08 DIAGNOSIS — Z6828 Body mass index (BMI) 28.0-28.9, adult: Secondary | ICD-10-CM | POA: Diagnosis not present

## 2019-06-08 DIAGNOSIS — I10 Essential (primary) hypertension: Secondary | ICD-10-CM | POA: Diagnosis not present

## 2019-06-11 DIAGNOSIS — L03116 Cellulitis of left lower limb: Secondary | ICD-10-CM | POA: Diagnosis not present

## 2019-06-25 DIAGNOSIS — Z23 Encounter for immunization: Secondary | ICD-10-CM | POA: Diagnosis not present

## 2019-06-25 DIAGNOSIS — M1A09X1 Idiopathic chronic gout, multiple sites, with tophus (tophi): Secondary | ICD-10-CM | POA: Diagnosis not present

## 2019-06-25 DIAGNOSIS — N189 Chronic kidney disease, unspecified: Secondary | ICD-10-CM | POA: Diagnosis not present

## 2019-06-25 DIAGNOSIS — M150 Primary generalized (osteo)arthritis: Secondary | ICD-10-CM | POA: Diagnosis not present

## 2019-06-25 DIAGNOSIS — Z79899 Other long term (current) drug therapy: Secondary | ICD-10-CM | POA: Diagnosis not present

## 2019-08-13 DIAGNOSIS — Z6829 Body mass index (BMI) 29.0-29.9, adult: Secondary | ICD-10-CM | POA: Diagnosis not present

## 2019-08-13 DIAGNOSIS — I10 Essential (primary) hypertension: Secondary | ICD-10-CM | POA: Diagnosis not present

## 2019-08-13 DIAGNOSIS — N183 Chronic kidney disease, stage 3 unspecified: Secondary | ICD-10-CM | POA: Diagnosis not present

## 2019-08-13 DIAGNOSIS — Z7901 Long term (current) use of anticoagulants: Secondary | ICD-10-CM | POA: Diagnosis not present

## 2019-08-13 DIAGNOSIS — R569 Unspecified convulsions: Secondary | ICD-10-CM | POA: Diagnosis not present

## 2019-08-13 DIAGNOSIS — R7309 Other abnormal glucose: Secondary | ICD-10-CM | POA: Diagnosis not present

## 2019-09-05 NOTE — Progress Notes (Signed)
PATIENT: Mitchell Comber [name]. DOB: October 09, 1950  REASON FOR VISIT: follow up HISTORY FROM: patient  HISTORY OF PRESENT ILLNESS: Today 09/06/19  HISTORY  Mitchell Simmonsis a 68 year old male, accompanied by his wife, and daughter [name] at today's visit, seen in request by his primary care physician Dr. Linna Darner, Darrick Penna, and Dr. [name], MD from his current [facility] for evaluation of seizure-like event, uncontrolled left-sided body jerking, initial evaluation was on July 03, 2018.  I have reviewed and summarized the referring note from the referring physician. 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  On June 07, 2018, he was at home, witnessed by his wife had sudden onset eyes rolling backwards, body jerking, convulsions for 2 minutes, followed by confusion afterwards.  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 He did have confusion following prolonged seizure event, last prolonged jerking episode was few days ago, he is not at much lower level of functional status, came in with wheelchair, was noted to have increased left-sided weakness.  UPDATE Aug 29 2018: He is accompanied by his wife at today's clinical visit, EEG on July 04, 2018 showed mild background slowing, there is no evidence of epileptiform discharge.  He has no recurrent seizure, tolerating Keppra 500/1000 mg.  Update March 01, 2019 SS: Mitchell [name], 68 year old male with history of seizure-like events, taking Keppra 500 mg in the morning, 1000 mg in the evening, he lives at home with his wife, he has not had recurrent seizure.  He reports his wife manages his medications, but he could, he does not drive, denies any new problems or concerns, does report some drowsiness, could be side effect of medication?  Since taking the Keppra, has not had any shaking episodes, has not had any alcohol since July 2019  Update September 06, 2019 SS: He remains on Keppra 500 mg tablet, 1 in the morning, 2 at bedtime.  He reports sometimes he does not take his bedtime  dose, because he doesn't think he needs it.  His wife continues to report his frequent daytime drowsiness, napping, just wanting to lay in the bed and watch TV.  He says he just enjoys watching TV.  He has not had recurrent seizure.  He does not drink alcohol.  He says he sleeps well at night,  but he gets up frequently to use the bathroom.  He has routine follow-up with his primary doctor, has been told recent lab work looked good.  They have discussed the drowsiness and fatigue with his PCP.  He uses a cane for ambulation.  He presents today for evaluation accompanied by his wife.  REVIEW OF SYSTEMS: Out of a complete 14 system review of symptoms, the patient complains only of the following symptoms, and all other reviewed systems are negative.  Seizures, drowsiness    ALLERGIES: Allergies  Allergen Reactions  . Hydrochlorothiazide     REACTION: precipitates gout  . Pseudoephedrine     REACTION: rash    HOME MEDICATIONS: Outpatient Medications Prior to Visit  Medication Sig Dispense Refill  . acetaminophen (TYLENOL) 325 MG tablet Take 2 tablets (650 mg total) by mouth every 6 (six) hours as needed for mild pain, moderate pain or fever (or Fever >/= 101).    Marland Kitchen allopurinol (ZYLOPRIM) 100 MG tablet Take 100 mg by mouth daily.    Marland Kitchen amLODipine (NORVASC) 10 MG tablet Take 1 tablet (10 mg total) by mouth daily. 30 tablet 0  . atorvastatin (LIPITOR) 40 MG tablet Take 1 tablet (40 mg total) by mouth daily at 6 PM.    . calcium-vitamin D (OSCAL WITH D) 500-200 MG-UNIT tablet Take 1 tablet by mouth 2 (two) times daily. 60 tablet 0  . carvedilol (COREG) 25 MG tablet Take 1 tablet (25 mg total) by mouth 2 (two) times daily with a meal. 60 tablet 0  . ferrous sulfate 325 (65 FE) MG tablet Take 1 tablet (325 mg total) by mouth daily with breakfast. 30 tablet 0  . folic acid (FOLVITE) 1 MG tablet Take 1 tablet (1 mg total) by mouth daily. 30 tablet 0  . furosemide (LASIX) 20 MG tablet Take 1 tablet (20 mg total) by mouth daily. 30 tablet 0  . levETIRAcetam (KEPPRA) 500 MG tablet One po qam, 2 tabs po qhs. 270 tablet 4  . magnesium oxide (MAG-OX) 400 MG tablet Take 1 tablet (400 mg total) by mouth daily. 30 tablet 0  . multivitamin (PROSIGHT) TABS tablet Take 1 tablet by mouth daily. 30 each  0  . pantoprazole (PROTONIX) 40 MG tablet Take 1 tablet (40 mg total) by mouth daily. 30 tablet 0  . rivaroxaban (XARELTO) 20 MG TABS tablet Take 1 tablet (20 mg total) by mouth daily with supper. 30 tablet 0  . thiamine 100 MG tablet Take 1 tablet (100 mg total) by mouth daily. 30 tablet 0  . NUTRITIONAL SUPPLEMENT LIQD Take 120 mLs by mouth 2 (two) times daily. MedPass     No facility-administered medications prior to visit.    PAST MEDICAL HISTORY: Past Medical History:  Diagnosis Date  . Alcohol abuse   . Anemia   . Anoxic brain injury ([place])   . Chronic pulmonary edema   . CVA (cerebral vascular accident) ([place])   . Dysphagia   . Gout   . Hemiplegia affecting left dominant side ([place])   . History of stroke   . Hyperlipemia   . Hypertension   . Seizures ([place])   .  Thrombocytopenia ([place])     PAST SURGICAL HISTORY: Past Surgical History:  Procedure Laterality Date  . lymph nodes     biopsy - benign  . RADIOLOGY WITH ANESTHESIA N/A 07/17/2015   Procedure: RADIOLOGY WITH ANESTHESIA;  Surgeon: Medication Radiologist, MD;  Location: [facility];  Service: Radiology;  Laterality: N/A;    FAMILY HISTORY: Family History  Problem Relation Age of Onset  . Aneurysm Sister   . Clotting disorder Brother   . Aneurysm Mother   . Heart attack Father     SOCIAL HISTORY: Social History   Socioeconomic History  . Marital status: Married    Spouse name: Not on file  . Number of children: 2  . Years of education: 11th grade  . Highest education level: Not on file  Occupational History  . Occupation: Retired  Tobacco Use  . Smoking status: Former Smoker    Types: Cigars  . Smokeless tobacco: Never Used  Substance and Sexual Activity  . Alcohol use: Not Currently    Comment: History of alcohol abuse  . Drug use: Not Currently    Types: Marijuana  . Sexual activity: Yes  Other Topics Concern  . Not on file  Social History Narrative   He is currently in rehab following a fall.    He is hoping to be discharged home soon.      Lives with his wife at home.   Right-handed.   Drinks decaf coffee   Social Determinants of Health   Financial Resource Strain:   . Difficulty of Paying Living Expenses: Not on file  Food Insecurity:   . Worried About Charity fundraiser in the Last Year: Not on file  . Ran Out of Food in the Last Year: Not on file  Transportation Needs:   . Lack of Transportation (Medical): Not on file  . Lack of Transportation (Non-Medical): Not on file  Physical Activity:   . Days of Exercise per Week: Not on file  . Minutes of Exercise per Session: Not on file  Stress:   . Feeling of Stress : Not on file  Social Connections:   . Frequency of Communication with Friends and Family: Not on file  . Frequency of Social Gatherings with Friends and Family: Not on file  . Attends Religious Services: Not on file  . Active Member of Clubs or Organizations: Not on file  . Attends Archivist Meetings: Not on file  . Marital Status: Not on file  Intimate Partner Violence:   . Fear of Current or Ex-Partner: Not on file  . Emotionally Abused: Not on file  . Physically Abused: Not on file  . Sexually Abused: Not on file   PHYSICAL EXAM  Vitals:   09/06/19 1109  BP: (!) 142/84  Pulse: 73  Temp: 98 F (36.7 C)  Weight: 194 lb 3.2 oz (88.1 kg)  Height: 5\' 8"  (1.727 m)   Body mass index is 29.53 kg/m.  Generalized: Well developed, in no acute distress   Neurological examination  Mentation: Alert oriented to time, place, history taking. Follows all commands speech and language fluent Cranial nerve II-XII: Pupils were equal round reactive to light. Extraocular movements were full, visual field were full on confrontational test. Facial sensation and strength were normal.  Head turning and shoulder shrug  were normal and symmetric. Motor: Spastic left hemiparesis, able to lift left arm, left leg Sensory: Sensory testing is intact to soft touch  on all 4 extremities. No evidence  of extinction is noted.  Coordination: Finger-nose-finger on the left is spastic Gait and station: Slow to rise from seated position, spastic gait on the left, uses cane  Reflexes: Deep tendon reflexes are symmetric and normal bilaterally.   DIAGNOSTIC DATA (LABS, IMAGING, TESTING) - I reviewed patient records, labs, notes, testing and imaging myself where available.  Lab Results  Component Value Date   WBC 6.9 06/26/2018   HGB 8.8 (A) 06/26/2018   HCT 26 (A) 06/26/2018   MCV 97.2 06/17/2018   PLT 294 06/26/2018      Component Value Date/Time   NA 141 06/27/2018 0000   K 4.7 06/27/2018 0000   CL 111 06/17/2018 1140   CO2 21 (L) 06/17/2018 1140   GLUCOSE 105 (H) 06/17/2018 1140   BUN 22 (A) 06/27/2018 0000   CREATININE 1.6 (A) 06/27/2018 0000   CREATININE 1.90 (H) 06/17/2018 1140   CALCIUM 8.6 (L) 06/17/2018 1140   PROT 7.1 06/07/2018 1645   ALBUMIN 2.3 (L) 06/13/2018 0500   AST 36 06/21/2018 0000   ALT 15 06/21/2018 0000   ALKPHOS 67 06/21/2018 0000   BILITOT 1.0 06/07/2018 1645   GFRNONAA 35 (L) 06/17/2018 1140   GFRAA 40 (L) 06/17/2018 1140   Lab Results  Component Value Date   CHOL 165 06/08/2018   HDL 86 06/08/2018   LDLCALC 62 06/08/2018   TRIG 87 06/08/2018   CHOLHDL 1.9 06/08/2018   Lab Results  Component Value Date   HGBA1C 5.2 06/08/2018   No results found for: VITAMINB12 No results found for: TSH    ASSESSMENT AND PLAN 68 y.o. year old male  has a past medical history of Alcohol abuse, Anemia, Anoxic brain injury ([place]), Chronic pulmonary edema, CVA (cerebral vascular accident) ([place]), Dysphagia, Gout, Hemiplegia affecting left dominant side ([place]), History of stroke, Hyperlipemia, Hypertension, Seizures ([place]), and Thrombocytopenia ([place]). here with:  1. History of right parietal lobe infarction, with residual spastic left hemiparesis 2. History of heavy alcohol abuse 3. Partial status epilepticus -EEG showed  background slowing, no epileptiform discharge -No recurrent seizure, since 2019 -Switch to Keppra XR 750 mg tablet, 2 tablets at bedtime, due to report of drowsiness, could be side effect from regular Keppra, hopefully improve compliance  -May also consider sleep evaluation in the future, history of snoring, fatigue, drowsiness, history of stroke -Follow-up in 6 months or sooner if needed  I spent 15 minutes with the patient. 50% of this time was spent discussing his plan of care.  Butler Denmark, AGNP-C, DNP 09/06/2019, 11:32 AM [facility] [address], [place], [place] [address] [phone]

## 2019-09-06 ENCOUNTER — Other Ambulatory Visit: Payer: Self-pay

## 2019-09-06 ENCOUNTER — Ambulatory Visit (INDEPENDENT_AMBULATORY_CARE_PROVIDER_SITE_OTHER): Payer: Medicare Other | Admitting: Neurology

## 2019-09-06 ENCOUNTER — Encounter: Payer: Self-pay | Admitting: Neurology

## 2019-09-06 VITALS — BP 142/84 | HR 73 | Temp 98.0°F | Ht 68.0 in | Wt 194.2 lb

## 2019-09-06 DIAGNOSIS — G40909 Epilepsy, unspecified, not intractable, without status epilepticus: Secondary | ICD-10-CM

## 2019-09-06 DIAGNOSIS — R569 Unspecified convulsions: Secondary | ICD-10-CM

## 2019-09-06 MED ORDER — LEVETIRACETAM ER 750 MG PO TB24: 1500.0000 mg | ORAL_TABLET | Freq: Every day | ORAL | 5 refills | Status: DC

## 2019-09-06 NOTE — Patient Instructions (Addendum)
Switch to Keppra Extended release 1500 mg at bedtime, to see if this helps with drowsiness    Return in 6 months or sooner if needed

## 2019-10-15 DIAGNOSIS — I10 Essential (primary) hypertension: Secondary | ICD-10-CM | POA: Diagnosis not present

## 2019-10-15 DIAGNOSIS — Z7901 Long term (current) use of anticoagulants: Secondary | ICD-10-CM | POA: Diagnosis not present

## 2019-10-15 DIAGNOSIS — N183 Chronic kidney disease, stage 3 unspecified: Secondary | ICD-10-CM | POA: Diagnosis not present

## 2019-10-15 DIAGNOSIS — R21 Rash and other nonspecific skin eruption: Secondary | ICD-10-CM | POA: Diagnosis not present

## 2019-10-16 ENCOUNTER — Telehealth: Payer: Self-pay

## 2019-10-16 ENCOUNTER — Telehealth: Payer: Self-pay | Admitting: Neurology

## 2019-10-16 NOTE — Telephone Encounter (Signed)
I called and spoke to wife of pt.  Since taking the keppra XR starting 09-06-19 has noted a more decreased appetite, lost weight 197 to 183.  She has noted also more moody.  Saw Dr. Berdine Addison yesterday had extremely low potassium and will be starting supplement and will get repeat labs this Thursday.  Dr. Berdine Addison wanted her to mention to [place].  Please advise.

## 2019-10-16 NOTE — Telephone Encounter (Addendum)
I relayed to wife of pt.  That SS/NP wants pt to continue on what he is on right now and then will readdress after his electrolytes are back to normal.  Call us early next week and let us know how he is doing and then proceed from there.   She said that pcp said the potassium is elevated.  He will be taking LUKELMA to reduce potassium level.  She will call us next week. To let us know how he is.

## 2019-10-16 NOTE — Telephone Encounter (Signed)
It looks like this is a duplicate from another message from today.I received a message from the after-hours call center at 8:06 AM today regarding patient feeling disoriented and more sleepy since seizure medicine has been changed and has no appetite.  Please call and check into this, maybe his new seizure medication dose has to be reduced.

## 2019-10-16 NOTE — Telephone Encounter (Signed)
I switched to XR Keppra to see if less side effect. Can switch back to regular Keppra if they prefer, see if he feels any better once electrolytes return to normal. If not, irritability may be side effect of Keppra, may need to switch to something else, Lamictal? Have them call us next week with update. He has been on Keppra since at least October 2019.

## 2019-10-16 NOTE — Telephone Encounter (Signed)
Patient wife called to report patient Is having a medication reaction to the  Levetiracetam (KEPPRA XR) 750 MG TB24 states patient is not eating or sleeping and he is very disoriented. Patient has also lost weight while on the medication.   Please follow up

## 2019-10-18 DIAGNOSIS — R799 Abnormal finding of blood chemistry, unspecified: Secondary | ICD-10-CM | POA: Diagnosis not present

## 2019-10-18 DIAGNOSIS — E785 Hyperlipidemia, unspecified: Secondary | ICD-10-CM | POA: Diagnosis not present

## 2019-10-26 DIAGNOSIS — R799 Abnormal finding of blood chemistry, unspecified: Secondary | ICD-10-CM | POA: Diagnosis not present

## 2019-11-16 ENCOUNTER — Other Ambulatory Visit: Payer: Self-pay | Admitting: Neurology

## 2019-11-16 DIAGNOSIS — R569 Unspecified convulsions: Secondary | ICD-10-CM

## 2019-11-22 DIAGNOSIS — H53462 Homonymous bilateral field defects, left side: Secondary | ICD-10-CM | POA: Diagnosis not present

## 2019-11-22 DIAGNOSIS — H40023 Open angle with borderline findings, high risk, bilateral: Secondary | ICD-10-CM | POA: Diagnosis not present

## 2019-11-22 DIAGNOSIS — H25812 Combined forms of age-related cataract, left eye: Secondary | ICD-10-CM | POA: Diagnosis not present

## 2019-11-22 DIAGNOSIS — Z961 Presence of intraocular lens: Secondary | ICD-10-CM | POA: Diagnosis not present

## 2019-11-22 NOTE — Telephone Encounter (Signed)
I LMVM for pts wife to return call Home and mobile concerning last phone message.  Awaiting her call back.

## 2019-11-23 ENCOUNTER — Ambulatory Visit: Payer: Medicare Other | Attending: Internal Medicine

## 2019-11-23 DIAGNOSIS — Z23 Encounter for immunization: Secondary | ICD-10-CM

## 2019-11-23 NOTE — Progress Notes (Signed)
   [id number]  Name:  Renton Berkley Sr.    MRN: [id number] DOB: 10-Feb-1951  11/23/2019  Mr. Zielinski was observed post Covid-19 immunization for 15 minutes without incidence. He was provided with Vaccine Information Sheet and instruction to access the V-Safe system.   Mr. Alligood was instructed to call 911 with any severe reactions post vaccine: Marland Kitchen Difficulty breathing  . Swelling of your face and throat  . A fast heartbeat  . A bad rash all over your body  . Dizziness and weakness    Immunizations Administered    Name Date Dose VIS Date Route   Pfizer COVID-19 Vaccine 11/23/2019 11:38 AM 0.3 mL 09/07/2019 Intramuscular   Manufacturer: [place]   Lot: [id number]   [place]: [id number]

## 2019-11-26 DIAGNOSIS — Z7901 Long term (current) use of anticoagulants: Secondary | ICD-10-CM | POA: Diagnosis not present

## 2019-11-26 DIAGNOSIS — I10 Essential (primary) hypertension: Secondary | ICD-10-CM | POA: Diagnosis not present

## 2019-11-26 DIAGNOSIS — Z6827 Body mass index (BMI) 27.0-27.9, adult: Secondary | ICD-10-CM | POA: Diagnosis not present

## 2019-11-26 DIAGNOSIS — Z7189 Other specified counseling: Secondary | ICD-10-CM | POA: Diagnosis not present

## 2019-11-26 DIAGNOSIS — R569 Unspecified convulsions: Secondary | ICD-10-CM | POA: Diagnosis not present

## 2019-11-26 DIAGNOSIS — N183 Chronic kidney disease, stage 3 unspecified: Secondary | ICD-10-CM | POA: Diagnosis not present

## 2019-11-28 NOTE — Progress Notes (Signed)
I have reviewed and agreed above plan. 

## 2019-12-18 ENCOUNTER — Ambulatory Visit: Payer: Medicare Other | Attending: Internal Medicine

## 2019-12-18 DIAGNOSIS — Z23 Encounter for immunization: Secondary | ICD-10-CM

## 2019-12-18 NOTE — Progress Notes (Signed)
   [id number]  Name:  Mitchell Breeding Sr.    MRN: [id number] DOB: 1950/10/04  12/18/2019  [name] was observed post Covid-19 immunization for 15 minutes without incident. He was provided with Vaccine Information Sheet and instruction to access the V-Safe system.   [name] was instructed to call 911 with any severe reactions post vaccine: Marland Kitchen Difficulty breathing  . Swelling of face and throat  . A fast heartbeat  . A bad rash all over body  . Dizziness and weakness   Immunizations Administered    Name Date Dose VIS Date Route   Pfizer COVID-19 Vaccine 12/18/2019  2:29 PM 0.3 mL 09/07/2019 Intramuscular   Manufacturer: [place]   Lot: [id number]   [place]: [id number]

## 2020-01-03 DIAGNOSIS — N189 Chronic kidney disease, unspecified: Secondary | ICD-10-CM | POA: Diagnosis not present

## 2020-01-03 DIAGNOSIS — M150 Primary generalized (osteo)arthritis: Secondary | ICD-10-CM | POA: Diagnosis not present

## 2020-01-03 DIAGNOSIS — Z79899 Other long term (current) drug therapy: Secondary | ICD-10-CM | POA: Diagnosis not present

## 2020-01-03 DIAGNOSIS — M1A09X1 Idiopathic chronic gout, multiple sites, with tophus (tophi): Secondary | ICD-10-CM | POA: Diagnosis not present

## 2020-01-03 DIAGNOSIS — Z8673 Personal history of transient ischemic attack (TIA), and cerebral infarction without residual deficits: Secondary | ICD-10-CM | POA: Diagnosis not present

## 2020-02-25 DIAGNOSIS — D649 Anemia, unspecified: Secondary | ICD-10-CM | POA: Diagnosis not present

## 2020-02-25 DIAGNOSIS — N183 Chronic kidney disease, stage 3 unspecified: Secondary | ICD-10-CM | POA: Diagnosis not present

## 2020-02-25 DIAGNOSIS — I129 Hypertensive chronic kidney disease with stage 1 through stage 4 chronic kidney disease, or unspecified chronic kidney disease: Secondary | ICD-10-CM | POA: Diagnosis not present

## 2020-02-25 DIAGNOSIS — I5042 Chronic combined systolic (congestive) and diastolic (congestive) heart failure: Secondary | ICD-10-CM | POA: Diagnosis not present

## 2020-02-26 DIAGNOSIS — N183 Chronic kidney disease, stage 3 unspecified: Secondary | ICD-10-CM | POA: Diagnosis not present

## 2020-02-26 DIAGNOSIS — G8194 Hemiplegia, unspecified affecting left nondominant side: Secondary | ICD-10-CM | POA: Diagnosis not present

## 2020-02-26 DIAGNOSIS — R569 Unspecified convulsions: Secondary | ICD-10-CM | POA: Diagnosis not present

## 2020-02-26 DIAGNOSIS — I10 Essential (primary) hypertension: Secondary | ICD-10-CM | POA: Diagnosis not present

## 2020-02-26 DIAGNOSIS — Z7901 Long term (current) use of anticoagulants: Secondary | ICD-10-CM | POA: Diagnosis not present

## 2020-03-11 ENCOUNTER — Ambulatory Visit: Payer: Medicare Other | Admitting: Neurology

## 2020-03-11 NOTE — Progress Notes (Deleted)
PATIENT: Mitchell Comber [name]. DOB: 1951-09-21  REASON FOR VISIT: follow up HISTORY FROM: patient  HISTORY OF PRESENT ILLNESS: Today 03/11/20  HISTORY  Mitchell Simmonsis a 69 year old male, accompanied by his wife, and daughter [name] at today's visit, seen in request by his primary care physician Dr. Linna Darner, Darrick Penna, and Dr. [name], MD from his current [facility] for evaluation of seizure-like event, uncontrolled left-sided body jerking, initial evaluation was on July 03, 2018.  I have reviewed and summarized the referring note from the referring physician. 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  On June 07, 2018, he was at home, witnessed by his wife had sudden onset eyes rolling backwards, body jerking, convulsions for 2 minutes, followed by confusion afterwards.  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 He did have confusion following prolonged seizure event, last prolonged jerking episode was few days ago, he is not at much lower level of functional status, came in with wheelchair, was noted to have increased left-sided weakness.  UPDATE Aug 29 2018: He is accompanied by his wife at today's clinical visit, EEG on July 04, 2018 showed mild background slowing, there is no evidence of epileptiform discharge.  He has no recurrent seizure, tolerating Keppra 500/1000 mg.  Update March 01, 2019 SS: Mitchell [name],69 year old male with history of seizure-like events,taking Keppra 500 mg in the morning,1000 mgin the evening,he lives at home with his wife, he has not had recurrent seizure. He reports his wife manages his medications, but he could, he does not drive, denies any new problems or concerns, does report some drowsiness, could be side effect of medication?Since taking the Keppra, has not had any shaking episodes, has not had any alcohol since July 2019  Update September 06, 2019 SS: He remains on Keppra 500 mg tablet, 1 in the morning, 2 at bedtime.  He reports sometimes he does not take his bedtime dose, because he doesn't think he needs it.  His wife continues to report his frequent daytime drowsiness, napping, just wanting to lay in the bed and watch TV.  He says he just enjoys watching TV.  He has not had recurrent seizure.  He does not drink alcohol.  He says he sleeps well at  night, but he gets up frequently to use the bathroom.  He has routine follow-up with his primary doctor, has been told recent lab work looked good.  They have discussed the drowsiness and fatigue with his PCP.  He uses a cane for ambulation.  He presents today for evaluation accompanied by his wife.  Update March 11, 2020 SS:   REVIEW OF SYSTEMS: Out of a complete 14 system review of symptoms, the patient complains only of the following symptoms, and all other reviewed systems are negative.  ALLERGIES: Allergies  Allergen Reactions  . Hydrochlorothiazide     REACTION: precipitates gout  . Pseudoephedrine     REACTION: rash    HOME MEDICATIONS: Outpatient Medications Prior to Visit  Medication Sig Dispense Refill  . acetaminophen (TYLENOL) 325 MG tablet Take 2 tablets (650 mg total) by mouth every 6 (six) hours as needed for mild pain, moderate pain or fever (or Fever >/= 101).    Marland Kitchen allopurinol (ZYLOPRIM) 100 MG tablet Take 100 mg by mouth daily.    Marland Kitchen amLODipine (NORVASC) 10 MG tablet Take 1 tablet (10 mg total) by mouth daily. 30 tablet 0  . atorvastatin (LIPITOR) 40 MG tablet Take 1 tablet (40 mg total) by mouth daily at 6 PM.    . calcium-vitamin D (OSCAL WITH D) 500-200 MG-UNIT tablet Take 1 tablet by mouth 2 (two) times daily. 60 tablet 0  . carvedilol (COREG) 25 MG tablet Take 1 tablet (25 mg total) by mouth 2 (two) times daily with a meal. 60 tablet 0  . ferrous sulfate 325 (65 FE) MG tablet Take 1 tablet (325 mg total) by mouth daily with breakfast. 30 tablet 0  . folic acid (FOLVITE) 1 MG tablet Take 1 tablet (1 mg total) by mouth daily. 30 tablet 0  . furosemide (LASIX) 20 MG tablet Take 1 tablet (20 mg total) by mouth daily. 30 tablet 0  . Levetiracetam (KEPPRA XR) 750 MG TB24 Take 2 tablets (1,500 mg total) by mouth at bedtime. 60 tablet 5  . magnesium oxide (MAG-OX) 400 MG tablet Take 1 tablet (400 mg total) by mouth daily. 30 tablet 0  . multivitamin (PROSIGHT) TABS tablet  Take 1 tablet by mouth daily. 30 each 0  . NUTRITIONAL SUPPLEMENT LIQD Take 120 mLs by mouth 2 (two) times daily. MedPass    . pantoprazole (PROTONIX) 40 MG tablet Take 1 tablet (40 mg total) by mouth daily. 30 tablet 0  . rivaroxaban (XARELTO) 20 MG TABS tablet Take 1 tablet (20 mg total) by mouth daily with supper. 30 tablet 0  . thiamine 100 MG tablet Take 1 tablet (100 mg total) by mouth daily. 30 tablet 0   No facility-administered medications prior to visit.    PAST MEDICAL HISTORY: Past Medical History:  Diagnosis Date  . Alcohol abuse   . Anemia   . Anoxic brain injury ([place])   . Chronic pulmonary edema   . CVA (cerebral vascular accident) ([place])   . Dysphagia   . Gout   . Hemiplegia affecting left dominant side ([place])   . History of stroke   . Hyperlipemia   . Hypertension   . Seizures ([place])   . Thrombocytopenia ([place])  PAST SURGICAL HISTORY: Past Surgical History:  Procedure Laterality Date  . lymph nodes     biopsy - benign  . RADIOLOGY WITH ANESTHESIA N/A 07/17/2015   Procedure: RADIOLOGY WITH ANESTHESIA;  Surgeon: Medication Radiologist, MD;  Location: [facility];  Service: Radiology;  Laterality: N/A;    FAMILY HISTORY: Family History  Problem Relation Age of Onset  . Aneurysm Sister   . Clotting disorder Brother   . Aneurysm Mother   . Heart attack Father     SOCIAL HISTORY: Social History   Socioeconomic History  . Marital status: Married    Spouse name: Not on file  . Number of children: 2  . Years of education: 11th grade  . Highest education level: Not on file  Occupational History  . Occupation: Retired  Tobacco Use  . Smoking status: Former Smoker    Types: Cigars  . Smokeless tobacco: Never Used  Vaping Use  . Vaping Use: Never used  Substance and Sexual Activity  . Alcohol use: Not Currently    Comment: History of alcohol abuse  . Drug use: Not Currently    Types: Marijuana  . Sexual activity: Yes  Other Topics Concern  . Not on  file  Social History Narrative   He is currently in rehab following a fall.   He is hoping to be discharged home soon.      Lives with his wife at home.   Right-handed.   Drinks decaf coffee   Social Determinants of Radio broadcast assistant Strain:   . Difficulty of Paying Living Expenses:   Food Insecurity:   . Worried About Charity fundraiser in the Last Year:   . Arboriculturist in the Last Year:   Transportation Needs:   . Film/video editor (Medical):   Marland Kitchen Lack of Transportation (Non-Medical):   Physical Activity:   . Days of Exercise per Week:   . Minutes of Exercise per Session:   Stress:   . Feeling of Stress :   Social Connections:   . Frequency of Communication with Friends and Family:   . Frequency of Social Gatherings with Friends and Family:   . Attends Religious Services:   . Active Member of Clubs or Organizations:   . Attends Archivist Meetings:   Marland Kitchen Marital Status:   Intimate Partner Violence:   . Fear of Current or Ex-Partner:   . Emotionally Abused:   Marland Kitchen Physically Abused:   . Sexually Abused:       PHYSICAL EXAM  There were no vitals filed for this visit. There is no height or weight on file to calculate BMI.  Generalized: Well developed, in no acute distress   Neurological examination  Mentation: Alert oriented to time, place, history taking. Follows all commands speech and language fluent Cranial nerve II-XII: Pupils were equal round reactive to light. Extraocular movements were full, visual field were full on confrontational test. Facial sensation and strength were normal. Uvula tongue midline. Head turning and shoulder shrug  were normal and symmetric. Motor: The motor testing reveals 5 over 5 strength of all 4 extremities. Good symmetric motor tone is noted throughout.  Sensory: Sensory testing is intact to soft touch on all 4 extremities. No evidence of extinction is noted.  Coordination: Cerebellar testing reveals good  finger-nose-finger and heel-to-shin bilaterally.  Gait and station: Gait is normal. Tandem gait is normal. Romberg is negative. No drift is seen.  Reflexes: Deep tendon reflexes are symmetric  and normal bilaterally.   DIAGNOSTIC DATA (LABS, IMAGING, TESTING) - I reviewed patient records, labs, notes, testing and imaging myself where available.  Lab Results  Component Value Date   WBC 6.9 06/26/2018   HGB 8.8 (A) 06/26/2018   HCT 26 (A) 06/26/2018   MCV 97.2 06/17/2018   PLT 294 06/26/2018      Component Value Date/Time   NA 141 06/27/2018 0000   K 4.7 06/27/2018 0000   CL 111 06/17/2018 1140   CO2 21 (L) 06/17/2018 1140   GLUCOSE 105 (H) 06/17/2018 1140   BUN 22 (A) 06/27/2018 0000   CREATININE 1.6 (A) 06/27/2018 0000   CREATININE 1.90 (H) 06/17/2018 1140   CALCIUM 8.6 (L) 06/17/2018 1140   PROT 7.1 06/07/2018 1645   ALBUMIN 2.3 (L) 06/13/2018 0500   AST 36 06/21/2018 0000   ALT 15 06/21/2018 0000   ALKPHOS 67 06/21/2018 0000   BILITOT 1.0 06/07/2018 1645   GFRNONAA 35 (L) 06/17/2018 1140   GFRAA 40 (L) 06/17/2018 1140   Lab Results  Component Value Date   CHOL 165 06/08/2018   HDL 86 06/08/2018   LDLCALC 62 06/08/2018   TRIG 87 06/08/2018   CHOLHDL 1.9 06/08/2018   Lab Results  Component Value Date   HGBA1C 5.2 06/08/2018   No results found for: VITAMINB12 No results found for: TSH    ASSESSMENT AND PLAN 69 y.o. year old male  has a past medical history of Alcohol abuse, Anemia, Anoxic brain injury ([place]), Chronic pulmonary edema, CVA (cerebral vascular accident) ([place]), Dysphagia, Gout, Hemiplegia affecting left dominant side ([place]), History of stroke, Hyperlipemia, Hypertension, Seizures ([place]), and Thrombocytopenia ([place]). here with:  1.  History of right parietal lobe infarction, with residual spastic left hemiparesis 2.  History of heavy alcohol abuse 3.  Partial status epilepticus -EEG showed background slowing, no epileptiform discharge -No recurrent  seizure since 2019   I spent 15 minutes with the patient. 50% of this time was spent   Butler Denmark, [place], DNP 03/11/2020, 5:38 AM [facility] [address], [place], [place] [address] [phone]

## 2020-03-24 ENCOUNTER — Other Ambulatory Visit: Payer: Self-pay

## 2020-03-24 ENCOUNTER — Encounter: Payer: Self-pay | Admitting: Neurology

## 2020-03-24 ENCOUNTER — Ambulatory Visit (INDEPENDENT_AMBULATORY_CARE_PROVIDER_SITE_OTHER): Payer: Medicare Other | Admitting: Neurology

## 2020-03-24 VITALS — BP 146/83 | HR 78 | Ht 68.0 in | Wt 187.0 lb

## 2020-03-24 DIAGNOSIS — G40909 Epilepsy, unspecified, not intractable, without status epilepticus: Secondary | ICD-10-CM | POA: Diagnosis not present

## 2020-03-24 MED ORDER — LEVETIRACETAM ER 750 MG PO TB24: 1500.0000 mg | ORAL_TABLET | Freq: Every day | ORAL | 4 refills | Status: DC

## 2020-03-24 NOTE — Progress Notes (Signed)
PATIENT: Mitchell Comber [name]. DOB: 11-Mar-1951  REASON FOR VISIT: follow up HISTORY FROM: patient  HISTORY OF PRESENT ILLNESS: Today 03/24/20  HISTORY  Mitchell Simmonsis a 69 year old male, accompanied by his wife, and daughter [name] at today's visit, seen in request by his primary care physician Dr. Linna Darner, Darrick Penna, and Dr. [name], MD from his current [facility] for evaluation of seizure-like event, uncontrolled left-sided body jerking, initial evaluation was on July 03, 2018.  I have reviewed and summarized the referring note from the referring physician. 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  On June 07, 2018, he was at home, witnessed by his wife had sudden onset eyes rolling backwards, body jerking, convulsions for 2 minutes, followed by confusion afterwards.  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, MRA of the neck was negative, He also suffered acute delirium from alcohol withdrawal during his hospital stay, improved with Seroquel, he was discharged to rehab his Keppra 500 mg twice a day,  He was discharged to [facility] rehabilitation on June 14, 2018, was taken to the emergency room on June 17, 2018 for recurrent UTI,  Laboratory evaluations, LDL 62, A1c 5.2, Hg 8.7, HCT 26, creat 1.6,  EEG on June 08, 2018 was normal Echocardiogram on June 08, 2018 showed ejection fraction of 30 to 70%, systolic function was moderate to severely reduced, diffuse hypokinesis.  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 He did have confusion following prolonged seizure event, last prolonged jerking episode was few days ago, he is not at much lower level of functional status, came in with wheelchair, was noted to have increased left-sided weakness.  UPDATE Aug 29 2018: He is accompanied by his wife at today's clinical visit, EEG on July 04, 2018 showed mild background slowing, there is no evidence of epileptiform discharge.  He has no recurrent seizure, tolerating Keppra 500/1000 mg.  Update March 01, 2019 SS: Mitchell [name],69 year old male with history of seizure-like events,taking Keppra 500 mg in the morning,1000 mgin the evening,he lives at home with his wife, he has not had recurrent seizure. He reports his wife manages his medications, but he could, he does not drive, denies any new problems or concerns, does report some drowsiness, could be side effect of medication?Since taking the Keppra, has not had any shaking episodes, has not had any alcohol since July 2019  Update September 06, 2019 SS: He remains on Keppra 500 mg tablet, 1 in the morning, 2 at bedtime.  He reports sometimes he does not take his bedtime dose, because he doesn't think he needs it.  His wife continues to report his frequent daytime drowsiness, napping, just wanting to lay in the bed and watch TV.  He says he just enjoys watching TV.  He has not had recurrent seizure.  He does not drink alcohol.  He says he sleeps well at  night, but he gets up frequently to use the bathroom.  He has routine follow-up with his primary doctor, has been told recent lab work looked good.  They have discussed the drowsiness and fatigue with his PCP.  He uses a cane for ambulation.  He presents today for evaluation accompanied by his wife.  Update March 24, 2020 SS: Here today for follow-up accompanied by his wife.  Remains on Keppra XR 750 mg, 2 tablets at bedtime, drowsiness has improved.  Has been doing well, no recent falls, keeps a cane just in case.  He no longer drives. He says he rests during the day, watching TV, denies significant drowsiness. Has flexion of left hand, fingers, at one point had brace from nursing home but lost it.  Went to PCP in early 2021, had elevated potassium.  REVIEW OF SYSTEMS: Out of a complete 14 system review of symptoms, the patient complains only of the following symptoms, and all other reviewed systems are negative.  seizure  ALLERGIES: Allergies  Allergen Reactions  . Hydrochlorothiazide     REACTION: precipitates gout  . Pseudoephedrine     REACTION: rash    HOME MEDICATIONS: Outpatient Medications Prior to Visit  Medication Sig Dispense Refill  . acetaminophen (TYLENOL) 325 MG tablet Take 2 tablets (650 mg total) by mouth every 6 (six) hours as needed for mild pain, moderate pain or fever (or Fever >/= 101).    Marland Kitchen allopurinol (ZYLOPRIM) 100 MG tablet Take 100 mg by mouth daily.    Marland Kitchen amLODipine (NORVASC) 10 MG tablet Take 1 tablet (10 mg total) by mouth daily. 30 tablet 0  . atorvastatin (LIPITOR) 40 MG tablet Take 1 tablet (40 mg total) by mouth daily at 6 PM.    . calcium-vitamin D (OSCAL WITH D) 500-200 MG-UNIT tablet Take 1 tablet by mouth 2 (two) times daily. 60 tablet 0  . carvedilol (COREG) 25 MG tablet Take 1 tablet (25 mg total) by mouth 2 (two) times daily with a meal. 60 tablet 0  . ferrous sulfate 325 (65 FE) MG tablet Take 1 tablet (325 mg total) by mouth daily with breakfast. 30  tablet 0  . folic acid (FOLVITE) 1 MG tablet Take 1 tablet (1 mg total) by mouth daily. 30 tablet 0  . furosemide (LASIX) 20 MG tablet Take 1 tablet (20 mg total) by mouth daily. 30 tablet 0  . magnesium oxide (MAG-OX) 400 MG tablet Take 1 tablet (400 mg total) by mouth daily. 30 tablet 0  . multivitamin (PROSIGHT) TABS tablet Take 1 tablet by mouth daily. 30 each 0  . NUTRITIONAL SUPPLEMENT LIQD Take 120 mLs by mouth 2 (two) times daily. MedPass    . pantoprazole (PROTONIX) 40 MG tablet Take 1 tablet (40 mg total) by mouth daily. 30 tablet 0  . rivaroxaban (XARELTO) 20 MG TABS tablet Take 1 tablet (20 mg total) by mouth daily with supper. 30 tablet 0  . thiamine 100 MG tablet Take 1 tablet (100 mg total) by mouth daily. 30 tablet 0  . Levetiracetam (KEPPRA XR) 750 MG TB24 Take 2 tablets (1,500 mg total) by mouth at bedtime. 60 tablet 5   No facility-administered medications prior to visit.  PAST MEDICAL HISTORY: Past Medical History:  Diagnosis Date  . Alcohol abuse   . Anemia   . Anoxic brain injury ([place])   . Chronic pulmonary edema   . CVA (cerebral vascular accident) ([place])   . Dysphagia   . Gout   . Hemiplegia affecting left dominant side ([place])   . History of stroke   . Hyperlipemia   . Hypertension   . Seizures ([place])   . Thrombocytopenia ([place])     PAST SURGICAL HISTORY: Past Surgical History:  Procedure Laterality Date  . lymph nodes     biopsy - benign  . RADIOLOGY WITH ANESTHESIA N/A 07/17/2015   Procedure: RADIOLOGY WITH ANESTHESIA;  Surgeon: Medication Radiologist, MD;  Location: [facility];  Service: Radiology;  Laterality: N/A;    FAMILY HISTORY: Family History  Problem Relation Age of Onset  . Aneurysm Sister   . Clotting disorder Brother   . Aneurysm Mother   . Heart attack Father     SOCIAL HISTORY: Social History   Socioeconomic History  . Marital status: Married    Spouse name: Not on file  . Number of children: 2  . Years of education: 11th grade   . Highest education level: Not on file  Occupational History  . Occupation: Retired  Tobacco Use  . Smoking status: Former Smoker    Types: Cigars  . Smokeless tobacco: Never Used  Vaping Use  . Vaping Use: Never used  Substance and Sexual Activity  . Alcohol use: Not Currently    Comment: History of alcohol abuse  . Drug use: Not Currently    Types: Marijuana  . Sexual activity: Yes  Other Topics Concern  . Not on file  Social History Narrative   He is currently in rehab following a fall.   He is hoping to be discharged home soon.      Lives with his wife at home.   Right-handed.   Drinks decaf coffee   Social Determinants of Radio broadcast assistant Strain:   . Difficulty of Paying Living Expenses:   Food Insecurity:   . Worried About Charity fundraiser in the Last Year:   . Arboriculturist in the Last Year:   Transportation Needs:   . Film/video editor (Medical):   Marland Kitchen Lack of Transportation (Non-Medical):   Physical Activity:   . Days of Exercise per Week:   . Minutes of Exercise per Session:   Stress:   . Feeling of Stress :   Social Connections:   . Frequency of Communication with Friends and Family:   . Frequency of Social Gatherings with Friends and Family:   . Attends Religious Services:   . Active Member of Clubs or Organizations:   . Attends Archivist Meetings:   Marland Kitchen Marital Status:   Intimate Partner Violence:   . Fear of Current or Ex-Partner:   . Emotionally Abused:   Marland Kitchen Physically Abused:   . Sexually Abused:    PHYSICAL EXAM  Vitals:   03/24/20 1302  BP: (!) 146/83  Pulse: 78  Weight: 187 lb (84.8 kg)  Height: 5\' 8"  (1.727 m)   Body mass index is 28.43 kg/m.  Generalized: Well developed, in no acute distress   Neurological examination  Mentation: Alert oriented to time, place, history taking. Follows all commands speech and language fluent Cranial nerve II-XII: Pupils were equal round reactive to light. Extraocular  movements were full, visual field were full on confrontational test.  Facial sensation and strength were normal. Head turning and shoulder shrug  were normal and symmetric. Motor: Spastic left hemiparesis, 3/5 left upper extremity weakness, 3/5 left hip flexion, flexion of left hand and fingers Sensory: Sensory testing is intact to soft touch on all 4 extremities. No evidence of extinction is noted.  Coordination: Finger-nose-finger on the left is spastic Gait and station: Slow to rise from seated position, spastic type gait on the left able to ambulate without cane Reflexes: Deep tendon reflexes are symmetric but slightly increased on the left  DIAGNOSTIC DATA (LABS, IMAGING, TESTING) - I reviewed patient records, labs, notes, testing and imaging myself where available.  Lab Results  Component Value Date   WBC 6.9 06/26/2018   HGB 8.8 (A) 06/26/2018   HCT 26 (A) 06/26/2018   MCV 97.2 06/17/2018   PLT 294 06/26/2018      Component Value Date/Time   NA 141 06/27/2018 0000   K 4.7 06/27/2018 0000   CL 111 06/17/2018 1140   CO2 21 (L) 06/17/2018 1140   GLUCOSE 105 (H) 06/17/2018 1140   BUN 22 (A) 06/27/2018 0000   CREATININE 1.6 (A) 06/27/2018 0000   CREATININE 1.90 (H) 06/17/2018 1140   CALCIUM 8.6 (L) 06/17/2018 1140   PROT 7.1 06/07/2018 1645   ALBUMIN 2.3 (L) 06/13/2018 0500   AST 36 06/21/2018 0000   ALT 15 06/21/2018 0000   ALKPHOS 67 06/21/2018 0000   BILITOT 1.0 06/07/2018 1645   GFRNONAA 35 (L) 06/17/2018 1140   GFRAA 40 (L) 06/17/2018 1140   Lab Results  Component Value Date   CHOL 165 06/08/2018   HDL 86 06/08/2018   LDLCALC 62 06/08/2018   TRIG 87 06/08/2018   CHOLHDL 1.9 06/08/2018   Lab Results  Component Value Date   HGBA1C 5.2 06/08/2018   No results found for: VITAMINB12 No results found for: TSH  ASSESSMENT AND PLAN 69 y.o. year old male  has a past medical history of Alcohol abuse, Anemia, Anoxic brain injury ([place]), Chronic pulmonary edema, CVA  (cerebral vascular accident) ([place]), Dysphagia, Gout, Hemiplegia affecting left dominant side ([place]), History of stroke, Hyperlipemia, Hypertension, Seizures ([place]), and Thrombocytopenia ([place]). here with:  1.  History of right parietal lobe infarction, with residual spastic left hemiparesis 2.  History of heavy alcohol abuse 3.  Partial status epilepticus -EEG showed background slowing, no epileptiform discharge -No recent seizure since 2019 -Has done well on switch to Keppra XR 750 mg, 2 tablets at bedtime, drowsiness has improved -Patient has left hemiparesis, left hand/fingers in flexion, at one point had brace that was helpful, if he can't locate at home, I can provide DME order -Follow-up in 6 months or sooner if needed  I spent 20 minutes of face-to-face and non-face-to-face time with patient.  This included previsit chart review, lab review, study review, order entry, electronic health record documentation, patient education.  Butler Denmark, AGNP-C, DNP 03/24/2020, 1:45 PM [facility] [address], [place], [place] [address] [phone]

## 2020-03-24 NOTE — Patient Instructions (Signed)
Continue current medications Keppra extended release at current dosing See you back in 6 months

## 2020-04-28 ENCOUNTER — Other Ambulatory Visit: Payer: Self-pay | Admitting: Neurology

## 2020-04-28 DIAGNOSIS — R569 Unspecified convulsions: Secondary | ICD-10-CM

## 2020-05-20 DIAGNOSIS — H25812 Combined forms of age-related cataract, left eye: Secondary | ICD-10-CM | POA: Diagnosis not present

## 2020-05-20 DIAGNOSIS — H40023 Open angle with borderline findings, high risk, bilateral: Secondary | ICD-10-CM | POA: Diagnosis not present

## 2020-05-20 DIAGNOSIS — Z961 Presence of intraocular lens: Secondary | ICD-10-CM | POA: Diagnosis not present

## 2020-05-20 DIAGNOSIS — H53462 Homonymous bilateral field defects, left side: Secondary | ICD-10-CM | POA: Diagnosis not present

## 2020-06-03 DIAGNOSIS — M150 Primary generalized (osteo)arthritis: Secondary | ICD-10-CM | POA: Diagnosis not present

## 2020-06-03 DIAGNOSIS — I10 Essential (primary) hypertension: Secondary | ICD-10-CM | POA: Diagnosis not present

## 2020-06-03 DIAGNOSIS — M1A09X1 Idiopathic chronic gout, multiple sites, with tophus (tophi): Secondary | ICD-10-CM | POA: Diagnosis not present

## 2020-06-03 DIAGNOSIS — R569 Unspecified convulsions: Secondary | ICD-10-CM | POA: Diagnosis not present

## 2020-06-03 DIAGNOSIS — N183 Chronic kidney disease, stage 3 unspecified: Secondary | ICD-10-CM | POA: Diagnosis not present

## 2020-06-04 DIAGNOSIS — Z23 Encounter for immunization: Secondary | ICD-10-CM | POA: Diagnosis not present

## 2020-06-17 DIAGNOSIS — Z20828 Contact with and (suspected) exposure to other viral communicable diseases: Secondary | ICD-10-CM | POA: Diagnosis not present

## 2020-07-03 DIAGNOSIS — N189 Chronic kidney disease, unspecified: Secondary | ICD-10-CM | POA: Diagnosis not present

## 2020-07-03 DIAGNOSIS — M79642 Pain in left hand: Secondary | ICD-10-CM | POA: Diagnosis not present

## 2020-07-03 DIAGNOSIS — M19032 Primary osteoarthritis, left wrist: Secondary | ICD-10-CM | POA: Diagnosis not present

## 2020-07-03 DIAGNOSIS — M8589 Other specified disorders of bone density and structure, multiple sites: Secondary | ICD-10-CM | POA: Diagnosis not present

## 2020-07-03 DIAGNOSIS — M1A09X1 Idiopathic chronic gout, multiple sites, with tophus (tophi): Secondary | ICD-10-CM | POA: Diagnosis not present

## 2020-07-03 DIAGNOSIS — M11241 Other chondrocalcinosis, right hand: Secondary | ICD-10-CM | POA: Diagnosis not present

## 2020-07-03 DIAGNOSIS — M79641 Pain in right hand: Secondary | ICD-10-CM | POA: Diagnosis not present

## 2020-07-03 DIAGNOSIS — M150 Primary generalized (osteo)arthritis: Secondary | ICD-10-CM | POA: Diagnosis not present

## 2020-07-03 DIAGNOSIS — M19041 Primary osteoarthritis, right hand: Secondary | ICD-10-CM | POA: Diagnosis not present

## 2020-07-03 DIAGNOSIS — Z79899 Other long term (current) drug therapy: Secondary | ICD-10-CM | POA: Diagnosis not present

## 2020-07-03 DIAGNOSIS — Z8673 Personal history of transient ischemic attack (TIA), and cerebral infarction without residual deficits: Secondary | ICD-10-CM | POA: Diagnosis not present

## 2020-07-03 DIAGNOSIS — M19042 Primary osteoarthritis, left hand: Secondary | ICD-10-CM | POA: Diagnosis not present

## 2020-07-03 DIAGNOSIS — M19031 Primary osteoarthritis, right wrist: Secondary | ICD-10-CM | POA: Diagnosis not present

## 2020-08-13 DIAGNOSIS — Z23 Encounter for immunization: Secondary | ICD-10-CM | POA: Diagnosis not present

## 2020-09-23 ENCOUNTER — Ambulatory Visit (INDEPENDENT_AMBULATORY_CARE_PROVIDER_SITE_OTHER): Payer: Medicare Other | Admitting: Neurology

## 2020-09-23 ENCOUNTER — Other Ambulatory Visit: Payer: Self-pay

## 2020-09-23 ENCOUNTER — Encounter: Payer: Self-pay | Admitting: Neurology

## 2020-09-23 VITALS — BP 152/80 | HR 70 | Ht 68.0 in | Wt 184.0 lb

## 2020-09-23 DIAGNOSIS — G40909 Epilepsy, unspecified, not intractable, without status epilepticus: Secondary | ICD-10-CM

## 2020-09-23 DIAGNOSIS — I6300 Cerebral infarction due to thrombosis of unspecified precerebral artery: Secondary | ICD-10-CM

## 2020-09-23 DIAGNOSIS — G4710 Hypersomnia, unspecified: Secondary | ICD-10-CM | POA: Diagnosis not present

## 2020-09-23 DIAGNOSIS — I69354 Hemiplegia and hemiparesis following cerebral infarction affecting left non-dominant side: Secondary | ICD-10-CM | POA: Diagnosis not present

## 2020-09-23 NOTE — Progress Notes (Signed)
PATIENT: Mitchell Comber [name]. DOB: 05-13-1951  REASON FOR VISIT: follow up HISTORY FROM: patient  HISTORY OF PRESENT ILLNESS: Today 09/23/20  HISTORY  Mitchell Marcy Simmonsis a 69 year old male, accompanied by his wife, and daughter [name] at today's visit, seen in request by his primary care physician Dr. Linna [name], Mitchell [name], and Dr. [name], MD from his current [facility] for evaluation of seizure-like event, uncontrolled left-sided body jerking, initial evaluation was on July 03, 2018.  I have reviewed and summarized the referring note from the referring physician. 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  On June 07, 2018, he was at home, witnessed by his wife had sudden onset eyes rolling backwards, body jerking, convulsions for 2 minutes, followed by confusion afterwards.  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 He did have confusion following prolonged seizure event, last prolonged jerking episode was few days ago, he is not at much lower level of functional status, came in with wheelchair, was noted to have increased left-sided weakness.  UPDATE Aug 29 2018: He is accompanied by his wife at today's clinical visit, EEG on July 04, 2018 showed mild background slowing, there is no evidence of epileptiform discharge.  He has no recurrent seizure, tolerating Keppra 500/1000 mg.  Update March 01, 2019 SS: Mitchell [name],69 year old male with history of seizure-like events,taking Keppra 500 mg in the morning,1000 mgin the evening,he lives at home with his wife, he has not had recurrent seizure. He reports his wife manages his medications, but he could, he does not drive, denies any new problems or concerns, does report some drowsiness, could be side effect of medication?Since taking the Keppra, has not had any shaking episodes, has not had any alcohol since July 2019  Update September 06, 2019 SS:He remains on Keppra 500 mg tablet, 1 in the morning, 2 at bedtime. He reports sometimes he does not take his bedtime dose, because he doesn't think heneeds it. His wife continues  to report hisfrequent daytime drowsiness,napping, just wanting to lay in the bed and watch TV.He says he just enjoys watching TV. He has not had recurrent seizure. He does not drink alcohol. He says he sleeps well at  night, but he gets up frequently to use the bathroom. He has routine follow-up with his primary doctor, has been told recent lab work looked good. They have discussed the drowsiness and fatigue with his PCP. He uses a cane for ambulation. He presents today for evaluation accompanied by his wife.  Update March 24, 2020 SS: Here today for follow-up accompanied by his wife.  Remains on Keppra XR 750 mg, 2 tablets at bedtime, drowsiness has improved.  Has been doing well, no recent falls, keeps a cane just in case.  He no longer drives. He says he rests during the day, watching TV, denies significant drowsiness. Has flexion of left hand, fingers, at one point had brace from nursing home but lost it.  Went to PCP in early 2021, had elevated potassium.  Update September 23, 2020 SS: Here today with his wife, remains on Keppra XR 750 mg, 2 tablets at bedtime.  No recurrent seizure. Wife mentions main complaint, is excessive sleepiness, drowsiness.  He sleeps all day, other than to eat, go to the bathroom, or shower.  He says he feels great, he does snore, no apnea spells reported. ESS was 10 today. Using cane. Never found brace for left hand.  We switched from [facility] to XR, wife thinks no change in drowsiness, reportedly has been going on for years. He is well appearing today, more interactive than normal.   REVIEW OF SYSTEMS: Out of a complete 14 system review of symptoms, the patient complains only of the following symptoms, and all other reviewed systems are negative.  Sleepiness  ALLERGIES: Allergies  Allergen Reactions  . Hydrochlorothiazide     REACTION: precipitates gout  . Pseudoephedrine     REACTION: rash    HOME MEDICATIONS: Outpatient Medications Prior to Visit  Medication Sig Dispense Refill  . acetaminophen (TYLENOL) 325 MG tablet Take 2 tablets (650 mg total) by mouth every 6 (six) hours as needed for mild pain, moderate pain or fever (or Fever >/= 101).    Marland Kitchen allopurinol  (ZYLOPRIM) 100 MG tablet Take 100 mg by mouth daily.    Marland Kitchen amLODipine (NORVASC) 10 MG tablet Take 1 tablet (10 mg total) by mouth daily. 30 tablet 0  . calcium-vitamin D (OSCAL WITH D) 500-200 MG-UNIT tablet Take 1 tablet by mouth 2 (two) times daily. 60 tablet 0  . carvedilol (COREG) 25 MG tablet Take 1 tablet (25 mg total) by mouth 2 (two) times daily with a meal. 60 tablet 0  . ferrous sulfate 325 (65 FE) MG tablet Take 1 tablet (325 mg total) by mouth daily with breakfast. 30 tablet 0  . folic acid (FOLVITE) 1 MG tablet Take 1 tablet (1 mg total) by mouth daily. 30 tablet 0  . furosemide (LASIX) 20 MG tablet Take 1 tablet (20 mg total) by mouth daily. 30 tablet 0  . Levetiracetam (KEPPRA XR) 750 MG TB24 Take 2 tablets (1,500 mg total) by mouth at bedtime. 180 tablet 4  . magnesium oxide (MAG-OX) 400 MG tablet Take 1 tablet (400 mg total) by mouth daily. 30 tablet 0  . multivitamin (PROSIGHT) TABS tablet Take 1 tablet by mouth daily. 30 each 0  . NUTRITIONAL SUPPLEMENT LIQD Take 120 mLs by mouth 2 (two) times daily. MedPass    .  pantoprazole (PROTONIX) 40 MG tablet Take 1 tablet (40 mg total) by mouth daily. 30 tablet 0  . rivaroxaban (XARELTO) 20 MG TABS tablet Take 1 tablet (20 mg total) by mouth daily with supper. 30 tablet 0  . thiamine 100 MG tablet Take 1 tablet (100 mg total) by mouth daily. 30 tablet 0  . atorvastatin (LIPITOR) 40 MG tablet Take 1 tablet (40 mg total) by mouth daily at 6 PM.     No facility-administered medications prior to visit.    PAST MEDICAL HISTORY: Past Medical History:  Diagnosis Date  . Alcohol abuse   . Anemia   . Anoxic brain injury ([place])   . Chronic pulmonary edema   . CVA (cerebral vascular accident) ([place])   . Dysphagia   . Gout   . Hemiplegia affecting left dominant side ([place])   . History of stroke   . Hyperlipemia   . Hypertension   . Seizures ([place])   . Thrombocytopenia ([place])     PAST SURGICAL HISTORY: Past Surgical History:  Procedure  Laterality Date  . lymph nodes     biopsy - benign  . RADIOLOGY WITH ANESTHESIA N/A 07/17/2015   Procedure: RADIOLOGY WITH ANESTHESIA;  Surgeon: Medication Radiologist, MD;  Location: [facility];  Service: Radiology;  Laterality: N/A;    FAMILY HISTORY: Family History  Problem Relation Age of Onset  . Aneurysm Sister   . Clotting disorder Brother   . Aneurysm Mother   . Heart attack Father     SOCIAL HISTORY: Social History   Socioeconomic History  . Marital status: Married    Spouse name: Not on file  . Number of children: 2  . Years of education: 11th grade  . Highest education level: Not on file  Occupational History  . Occupation: Retired  Tobacco Use  . Smoking status: Former Smoker    Types: Cigars  . Smokeless tobacco: Never Used  Vaping Use  . Vaping Use: Never used  Substance and Sexual Activity  . Alcohol use: Not Currently    Comment: History of alcohol abuse  . Drug use: Not Currently    Types: Marijuana  . Sexual activity: Yes  Other Topics Concern  . Not on file  Social History Narrative   He is currently in rehab following a fall.   He is hoping to be discharged home soon.      Lives with his wife at home.   Right-handed.   Drinks decaf coffee   Social Determinants of Radio broadcast assistant Strain: Not on file  Food Insecurity: Not on file  Transportation Needs: Not on file  Physical Activity: Not on file  Stress: Not on file  Social Connections: Not on file  Intimate Partner Violence: Not on file   PHYSICAL EXAM  Vitals:   09/23/20 1456  BP: (!) 152/80  Pulse: 70  Weight: 184 lb (83.5 kg)  Height: 5\' 8"  (1.727 m)   Body mass index is 27.98 kg/m.  Generalized: Well developed, in no acute distress   Neurological examination  Mentation: Alert oriented to time, place, history taking. Follows all commands speech and language fluent, engaged Cranial nerve II-XII: Pupils were equal round reactive to light. Extraocular movements were  full, visual field were full on confrontational test. Facial sensation and strength were normal. Head turning and shoulder shrug  were normal and symmetric. Motor: Spastic left hemiparesis, 3/5 left upper extremity weakness, 3/5 left hip flexion, flexion of left hand, 1, 2, 4th fingers  Sensory: decreased sensation to soft touch left upper  Coordination: Left finger-nose-finger is spastic Gait and station: Spastic type gait on the left, can ambulate in the room without a cane Reflexes: Deep tendon reflexes are symmetric but slightly increased on the left  DIAGNOSTIC DATA (LABS, IMAGING, TESTING) - I reviewed patient records, labs, notes, testing and imaging myself where available.  Lab Results  Component Value Date   WBC 6.9 06/26/2018   HGB 8.8 (A) 06/26/2018   HCT 26 (A) 06/26/2018   MCV 97.2 06/17/2018   PLT 294 06/26/2018      Component Value Date/Time   NA 141 06/27/2018 0000   K 4.7 06/27/2018 0000   CL 111 06/17/2018 1140   CO2 21 (L) 06/17/2018 1140   GLUCOSE 105 (H) 06/17/2018 1140   BUN 22 (A) 06/27/2018 0000   CREATININE 1.6 (A) 06/27/2018 0000   CREATININE 1.90 (H) 06/17/2018 1140   CALCIUM 8.6 (L) 06/17/2018 1140   PROT 7.1 06/07/2018 1645   ALBUMIN 2.3 (L) 06/13/2018 0500   AST 36 06/21/2018 0000   ALT 15 06/21/2018 0000   ALKPHOS 67 06/21/2018 0000   BILITOT 1.0 06/07/2018 1645   GFRNONAA 35 (L) 06/17/2018 1140   GFRAA 40 (L) 06/17/2018 1140   Lab Results  Component Value Date   CHOL 165 06/08/2018   HDL 86 06/08/2018   LDLCALC 62 06/08/2018   TRIG 87 06/08/2018   CHOLHDL 1.9 06/08/2018   Lab Results  Component Value Date   HGBA1C 5.2 06/08/2018   No results found for: VITAMINB12 No results found for: TSH    ASSESSMENT AND PLAN 69 y.o. year old male  has a past medical history of Alcohol abuse, Anemia, Anoxic brain injury ([place]), Chronic pulmonary edema, CVA (cerebral vascular accident) ([place]), Dysphagia, Gout, Hemiplegia affecting left dominant  side ([place]), History of stroke, Hyperlipemia, Hypertension, Seizures ([place]), and Thrombocytopenia ([place]). here with:  1.  History of right parietal lobe infarction, with residual spastic left hemiparesis 2.  History of heavy alcohol abuse 3.  Partial status epilepticus 4.  Drowsiness, snoring, excessive sleepiness -EEG showed background slowing, no epileptiform discharge -No recent seizure since 2019 -Continue Keppra XR 750 mg,2 tablets at bedtime for seizure prevention -Will refer for sleep evaluation due to excessive drowsiness, sleepiness, snoring, history of stroke, ESS 10 -Given prescription for left hand brace, with history of left hemiparesis, left hand/fingers in flexion -Follow-up in 6 months or sooner if needed  I spent 30 minutes of face-to-face and non-face-to-face time with patient.  This included previsit chart review, lab review, study review, order entry, electronic health record documentation, patient education.  Butler Denmark, AGNP-C, DNP 09/23/2020, 3:37 PM [facility] [address], [place], [place] [address] [phone]

## 2020-09-23 NOTE — Patient Instructions (Signed)
Continue Keppra at current dosing Call for seizures Refer you for sleep evaluation  Give script for hand brace See you back in 6 months

## 2020-09-24 ENCOUNTER — Telehealth: Payer: Self-pay | Admitting: Neurology

## 2020-09-24 NOTE — Telephone Encounter (Signed)
[facility] called and stated that pt came in late last night and they did not have any demographics on him so they need that to be faxed over to  [phone].

## 2020-09-24 NOTE — Telephone Encounter (Signed)
I received fax confirmation [phone] [facility] for demographics.  (sent insurance info too).

## 2020-10-07 DIAGNOSIS — Z7901 Long term (current) use of anticoagulants: Secondary | ICD-10-CM | POA: Diagnosis not present

## 2020-10-07 DIAGNOSIS — M10042 Idiopathic gout, left hand: Secondary | ICD-10-CM | POA: Diagnosis not present

## 2020-10-07 DIAGNOSIS — M1830 Unilateral post-traumatic osteoarthritis of first carpometacarpal joint, unspecified hand: Secondary | ICD-10-CM | POA: Diagnosis not present

## 2020-10-07 DIAGNOSIS — M10041 Idiopathic gout, right hand: Secondary | ICD-10-CM | POA: Diagnosis not present

## 2020-10-07 DIAGNOSIS — I5042 Chronic combined systolic (congestive) and diastolic (congestive) heart failure: Secondary | ICD-10-CM | POA: Diagnosis not present

## 2020-10-07 DIAGNOSIS — N183 Chronic kidney disease, stage 3 unspecified: Secondary | ICD-10-CM | POA: Diagnosis not present

## 2020-10-07 DIAGNOSIS — I10 Essential (primary) hypertension: Secondary | ICD-10-CM | POA: Diagnosis not present

## 2020-10-28 ENCOUNTER — Other Ambulatory Visit: Payer: Self-pay

## 2020-10-28 ENCOUNTER — Encounter: Payer: Self-pay | Admitting: Neurology

## 2020-10-28 ENCOUNTER — Ambulatory Visit (INDEPENDENT_AMBULATORY_CARE_PROVIDER_SITE_OTHER): Payer: Medicare Other | Admitting: Neurology

## 2020-10-28 VITALS — BP 136/78 | HR 80 | Ht 68.0 in | Wt 187.0 lb

## 2020-10-28 DIAGNOSIS — Z8673 Personal history of transient ischemic attack (TIA), and cerebral infarction without residual deficits: Secondary | ICD-10-CM

## 2020-10-28 DIAGNOSIS — G40909 Epilepsy, unspecified, not intractable, without status epilepticus: Secondary | ICD-10-CM

## 2020-10-28 DIAGNOSIS — R0683 Snoring: Secondary | ICD-10-CM

## 2020-10-28 DIAGNOSIS — I69354 Hemiplegia and hemiparesis following cerebral infarction affecting left non-dominant side: Secondary | ICD-10-CM

## 2020-10-28 DIAGNOSIS — G4719 Other hypersomnia: Secondary | ICD-10-CM

## 2020-10-28 DIAGNOSIS — E663 Overweight: Secondary | ICD-10-CM

## 2020-10-28 NOTE — Patient Instructions (Signed)

## 2020-10-28 NOTE — Progress Notes (Signed)
Subjective:    [name] ID: Mitchell Doubek Sr. is a 70 y.o. male.  HPI     Mitchell Age, MD, PhD [facility] [address] [place], [place] [address]  Dear Judson Roch and Aliene Beams,   I saw your [name], Mitchell [name], upon your kind request in Mitchell sleep clinic today for initial consultation of his sleep disorder, in particular, concern for underlying obstructive sleep apnea.  Mitchell [name] is accompanied by his wife today. As you know, Mitchell [name] is a 70 year old right-handed gentleman with an underlying medical history of stroke, seizure disorder, hyperlipidemia, hypertension, thrombocytopenia, anemia, alcohol use disorder, gout, and overweight state, who reports snoring and excessive daytime somnolence.  I reviewed your office note from 09/23/2020.  His Epworth sleepiness score is 5 out of 24, which may be an underestimation.  His wife reports that Mitchell [name] tends to stay in bed most of Mitchell day, Mitchell [name] denies it.  Mitchell [name] reports a bedtime of around 10 PM and rise time of around 8 or 9 AM.  Mitchell [name] is not aware of any family history of sleep apnea.  Mitchell [name] would be willing to come in stay overnight for sleep testing.  Mitchell [name] denies recurrent morning headaches.  Mitchell [name] has nocturia about 3 times per average night.  Mitchell [name] quit drinking alcohol in 2019, Mitchell [name] quit smoking in Mitchell 70s.  Mitchell [name] drinks no caffeine on a daily basis, decaf coffee.  His Past Medical History Is Significant For: Past Medical History:  Diagnosis Date  . Alcohol abuse   . Anemia   . Anoxic brain injury ([place])   . Chronic pulmonary edema   . CVA (cerebral vascular accident) ([place])   . Dysphagia   . Gout   . Hemiplegia affecting left dominant side ([place])   . History of stroke   . Hyperlipemia   . Hypertension   . Seizures ([place])   . Thrombocytopenia ([place])     His Past Surgical History Is Significant For: Past Surgical History:  Procedure Laterality Date  . lymph nodes     biopsy - benign  . RADIOLOGY WITH ANESTHESIA N/A 07/17/2015    Procedure: RADIOLOGY WITH ANESTHESIA;  Surgeon: Medication Radiologist, MD;  Location: [facility];  Service: Radiology;  Laterality: N/A;    His Family History Is Significant For: Family History  Problem Relation [name] of Onset  . Aneurysm Sister   . Clotting disorder Brother   . Aneurysm Mother   . Heart attack Father     His Social History Is Significant For: Social History   Socioeconomic History  . Marital status: Married    Spouse name: Not on file  . Number of children: 2  . Years of education: 11th grade  . Highest education level: Not on file  Occupational History  . Occupation: Retired  Tobacco Use  . Smoking status: Former Smoker    Types: Cigars  . Smokeless tobacco: Never Used  Vaping Use  . Vaping Use: Never used  Substance and Sexual Activity  . Alcohol use: Not Currently    Comment: History of alcohol abuse  . Drug use: Not Currently    Types: Marijuana  . Sexual activity: Yes  Other Topics Concern  . Not on file  Social History Narrative   Mitchell [name] is currently in rehab following a fall.   Mitchell [name] is hoping to be discharged home soon.      Lives with his wife at home.   Right-handed.   Drinks decaf coffee  Social Determinants of Health   Financial Resource Strain: Not on file  Food Insecurity: Not on file  Transportation Needs: Not on file  Physical Activity: Not on file  Stress: Not on file  Social Connections: Not on file    His Allergies Are:  Allergies  Allergen Reactions  . Hydrochlorothiazide     REACTION: precipitates gout  . Pseudoephedrine     REACTION: rash  :   His Current Medications Are:  Outpatient Encounter Medications as of 10/28/2020  Medication Sig  . acetaminophen (TYLENOL) 325 MG tablet Take 2 tablets (650 mg total) by mouth every 6 (six) hours as needed for mild pain, moderate pain or fever (or Fever >/= 101).  Marland Kitchen allopurinol (ZYLOPRIM) 100 MG tablet Take 100 mg by mouth daily.  Marland Kitchen amLODipine (NORVASC) 10 MG tablet Take 1 tablet (10  mg total) by mouth daily.  . calcium-vitamin D (OSCAL WITH D) 500-200 MG-UNIT tablet Take 1 tablet by mouth 2 (two) times daily.  . carvedilol (COREG) 25 MG tablet Take 1 tablet (25 mg total) by mouth 2 (two) times daily with a meal.  . ferrous sulfate 325 (65 FE) MG tablet Take 1 tablet (325 mg total) by mouth daily with breakfast.  . folic acid (FOLVITE) 1 MG tablet Take 1 tablet (1 mg total) by mouth daily.  . furosemide (LASIX) 20 MG tablet Take 1 tablet (20 mg total) by mouth daily.  . Levetiracetam (KEPPRA XR) 750 MG TB24 Take 2 tablets (1,500 mg total) by mouth at bedtime.  . magnesium oxide (MAG-OX) 400 MG tablet Take 1 tablet (400 mg total) by mouth daily.  . multivitamin (PROSIGHT) TABS tablet Take 1 tablet by mouth daily.  . pantoprazole (PROTONIX) 40 MG tablet Take 1 tablet (40 mg total) by mouth daily.  . rivaroxaban (XARELTO) 20 MG TABS tablet Take 1 tablet (20 mg total) by mouth daily with supper.  . thiamine 100 MG tablet Take 1 tablet (100 mg total) by mouth daily.  . [DISCONTINUED] NUTRITIONAL SUPPLEMENT LIQD Take 120 mLs by mouth 2 (two) times daily. MedPass   No facility-administered encounter medications on file as of 10/28/2020.  :  Review of Systems:  Out of a complete 14 point review of systems, all are reviewed and negative with Mitchell exception of these symptoms as listed below: Review of Systems  Neurological:       Here for sleep consult. No prior sleep study. Pt reports Mitchell [name] does snore at night. Daytime sleepiness is present.  Epworth Sleepiness Scale 0= would never doze 1= slight chance of dozing 2= moderate chance of dozing 3= high chance of dozing  Sitting and reading:0 Watching TV:2 Sitting inactive in a public place (ex. Theater or meeting):0 As a passenger in a car for an hour without a break:0 Lying down to rest in Mitchell afternoon:3 Sitting and talking to someone:0 Sitting quietly after lunch (no alcohol):0 In a car, while stopped in traffic:0 Total:5      Objective:  Neurological Exam  Physical Exam Physical Examination:   Vitals:   10/28/20 1330  BP: 136/78  Pulse: 80  SpO2: 97%    General Examination: Mitchell [name] is a very pleasant 70 y.o. male in no acute distress. Mitchell [name] appears somewhat deconditioned, Mitchell [name] has mobility issues, left hand and forearm in a brace.  HEENT: Normocephalic, atraumatic, pupils are equal, round and reactive to light, extraocular tracking is preserved, hearing is grossly intact, airway examination reveals mild to moderate mouth dryness, adequate dental hygiene  with full dentures, moderate airway crowding noted.  Tongue protrudes centrally and palate elevates symmetrically.   Hearing is grossly intact, face is symmetric with normal facial animation.  Chest: Clear to auscultation without wheezing, rhonchi or crackles noted.  Heart: S1+S2+0, regular and normal without murmurs, rubs or gallops noted.   Abdomen: Soft, non-tender and non-distended with normal bowel sounds appreciated on auscultation.  Extremities: There is no pitting edema in Mitchell distal lower extremities bilaterally.   Skin: Warm and dry without trophic changes noted.   Musculoskeletal: exam reveals left hand and forearm in a brace, appears to have significant contracture in Mitchell left hand and joint deformity also in Mitchell right hand.    Neurologically:  Mental status: Mitchell [name] is awake, alert and oriented in all 4 spheres. His immediate and remote memory, attention, language skills and fund of knowledge are appropriate. There is no evidence of aphasia, agnosia, apraxia or anomia. Speech is clear with normal prosody and enunciation. Thought process is linear. Mood is normal and affect is normal.  Cranial nerves II - XII are as described above under HEENT exam.  Motor exam: Mobility issues, limited mobility in Mitchell left upper extremity, reduced strength in Mitchell right upper extremity and both lower extremities in Mitchell 4 out of 5 range.  Romberg is not  tested for safety concerns.  Fine motor skills are impaired.   Sensory exam: intact to light touch in Mitchell upper and lower extremities.  Gait, station and balance: Mitchell [name] stands with difficulty and pushes himself up.  Mitchell [name] walks with a single-point cane on Mitchell right side.  Mitchell [name] walks insecurely.   Assessment and Plan:  In summary, Mitchell Vanderveen. is a very pleasant 70 y.o.-year old male with an underlying medical history of stroke, seizure disorder, hyperlipidemia, hypertension, thrombocytopenia, anemia, alcohol use disorder, gout, and overweight state, who presents for evaluation of his sleep disturbance.  History and examination are concerning for underlying obstructive sleep apnea (OSA). I had a long chat with Mitchell [name] and his wife about my findings and Mitchell diagnosis of OSA, its prognosis and treatment options. We talked about medical treatments, surgical interventions and non-pharmacological approaches. I explained in particular Mitchell risks and ramifications of untreated moderate to severe OSA, especially with respect to developing cardiovascular disease down [address], including congestive heart failure, difficult to treat hypertension, cardiac arrhythmias, or stroke. Even type 2 diabetes has, in part, been linked to untreated OSA. Symptoms of untreated OSA include daytime sleepiness, memory problems, mood irritability and mood disorder such as depression and anxiety, lack of energy, as well as recurrent headaches, especially morning headaches. We talked about trying to maintain a healthy lifestyle in general, as well as Mitchell importance of weight control. We also talked about Mitchell importance of good sleep hygiene. I recommended Mitchell following at this time: sleep study.   I explained Mitchell sleep test procedure to Mitchell [name] and also outlined possible surgical and non-surgical treatment options of OSA, including Mitchell use of a custom-made dental device (which would require a referral to a specialist dentist or oral  surgeon), upper airway surgical options, such as traditional UPPP or a novel less invasive surgical option in Mitchell form of Inspire hypoglossal nerve stimulation (which would involve a referral to an ENT surgeon). I also explained Mitchell CPAP treatment option to Mitchell [name], who indicated that Mitchell [name] would be willing to try CPAP if Mitchell need arises. I explained Mitchell importance of being compliant with PAP treatment, not only for insurance  purposes but primarily to improve His symptoms, and for Mitchell [name]'s long term health benefit, including to reduce His cardiovascular risks. I answered all their questions today and Mitchell [name] and his wife were in agreement. I plan to see him back after Mitchell sleep study is completed and encouraged him to call with any interim questions, concerns, problems or updates.   Thank you very much for allowing me to participate in Mitchell care of this nice [name]. If I can be of any further assistance to you please do not hesitate to talk to me.  Sincerely,   Mitchell Age, MD, PhD

## 2020-11-03 ENCOUNTER — Other Ambulatory Visit: Payer: Self-pay

## 2020-11-03 ENCOUNTER — Ambulatory Visit (INDEPENDENT_AMBULATORY_CARE_PROVIDER_SITE_OTHER): Payer: Medicare Other | Admitting: Neurology

## 2020-11-03 DIAGNOSIS — R9431 Abnormal electrocardiogram [ECG] [EKG]: Secondary | ICD-10-CM

## 2020-11-03 DIAGNOSIS — G4733 Obstructive sleep apnea (adult) (pediatric): Secondary | ICD-10-CM

## 2020-11-03 DIAGNOSIS — G4719 Other hypersomnia: Secondary | ICD-10-CM

## 2020-11-03 DIAGNOSIS — Z8673 Personal history of transient ischemic attack (TIA), and cerebral infarction without residual deficits: Secondary | ICD-10-CM

## 2020-11-03 DIAGNOSIS — G4720 Circadian rhythm sleep disorder, unspecified type: Secondary | ICD-10-CM

## 2020-11-03 DIAGNOSIS — I69354 Hemiplegia and hemiparesis following cerebral infarction affecting left non-dominant side: Secondary | ICD-10-CM

## 2020-11-03 DIAGNOSIS — G478 Other sleep disorders: Secondary | ICD-10-CM

## 2020-11-03 DIAGNOSIS — R0683 Snoring: Secondary | ICD-10-CM

## 2020-11-03 DIAGNOSIS — G40909 Epilepsy, unspecified, not intractable, without status epilepticus: Secondary | ICD-10-CM

## 2020-11-03 DIAGNOSIS — E663 Overweight: Secondary | ICD-10-CM

## 2020-11-10 NOTE — Progress Notes (Signed)
Patient referred by Dr. Krista Blue and Judson Roch for concern for OSA, seen by me on 10/28/20, diagnostic PSG on 11/03/20.   Please call and notify the patient that the recent sleep study showed obstructive sleep apnea but unfortunately he did not sleep very much or very well.  He had very little dream sleep and had significant sleep disruption.  Overall sleep apnea was in the mild range but was much more noticeable during dream sleep.  He also had abnormalities on the EKG including extra beats which recall PVCs and I would recommend since these were fairly significant and frequent that he make a follow-up appointment with his primary care physician for a proper EKG and potentially a referral to a cardiologist.  His extra beats were more noticeable during sleep.  I would recommend treatment in the form of an AutoPap machine.  This means that he does not have to come back for second sleep study but we can get him started on treatment at home with a so-called AutoPap machine through a local durable medical equipment company.  Once he is started on treatment, he will need a follow-up within 30 to 90 days for checkup and compliance review.  He can get scheduled with Judson Roch or myself.   Star Age, MD, PhD [facility])

## 2020-11-10 NOTE — Procedures (Signed)
PATIENT'S NAME:  Mitchell [name], Mitchell [name] DOB:      1951-04-08      MR#:    [id number]     DATE OF RECORDING: 11/03/2020 REFERRING M.D.:  Marcial Pacas, MD Study Performed:   Baseline Polysomnogram HISTORY: 70 year old man with a history of stroke, seizure disorder, hyperlipidemia, hypertension, thrombocytopenia, anemia, alcohol use disorder, gout, and overweight state, who reports snoring and excessive daytime somnolence. The patient endorsed the Epworth Sleepiness Scale at 5 points. The patient's weight 187 pounds with a height of 68 (inches), resulting in a BMI of 28.4 kg/m2. The patient's neck circumference measured 15 inches.  CURRENT MEDICATIONS: Tylenol, Zyloprim, Norvasc, Coreg, Folvite, Lasix, Keppra XR, Magnesium oxide, Prosight, Protonix, Xarelto, Thiamine, Calcium-Vitamin D, Ferrous sulfate   PROCEDURE:  This is a multichannel digital polysomnogram utilizing the Somnostar 11.2 system.  Electrodes and sensors were applied and monitored per AASM Specifications.   EEG, EOG, Chin and Limb EMG, were sampled at 200 Hz.  ECG, Snore and Nasal Pressure, Thermal Airflow, Respiratory Effort, CPAP Flow and Pressure, Oximetry was sampled at 50 Hz. Digital video and audio were recorded.      BASELINE STUDY  Lights Out was at 21:10 and Lights On at 05:01.  Total recording time (TRT) was 471 minutes, with a total sleep time (TST) of 168 minutes.   The patient's sleep latency was 26 minutes.  REM latency was 339.5 minutes, which is markedly delayed. The sleep efficiency was 35.7%, which is markedly reduced.     SLEEP ARCHITECTURE: WASO (Wake after sleep onset) was 272.5 minutes with one long period of wakefulness and otherwise moderate sleep fragmentation noted. There were 28 minutes in Stage N1, 131 minutes Stage N2, 0 minutes Stage N3 and 9 minutes in Stage REM.  The percentage of Stage N1 was 16.7%, which is markedly increased, Stage N2 was 78.%, which is markedly increased, Stage N3 was absent and Stage R (REM sleep) was  5.4%, which is markedly reduced. The arousals were noted as: 27 were spontaneous, 0 were associated with PLMs, 5 were associated with respiratory events.  RESPIRATORY ANALYSIS:  There were a total of 27 respiratory events:  18 obstructive apneas, 0 central apneas and 0 mixed apneas with a total of 18 apneas and an apnea index (AI) of 6.4 /hour. There were 9 hypopneas with a hypopnea index of 3.2 /hour. The patient also had 0 respiratory event related arousals (RERAs).      The total APNEA/HYPOPNEA INDEX (AHI) was 9.6/hour and the total RESPIRATORY DISTURBANCE INDEX was  9.6 /hour.  5 events occurred in REM sleep and 10 events in NREM. The REM AHI was  33.3 /hour, versus a non-REM AHI of 8.3. The patient spent 88 minutes of total sleep time in the supine position and 80 minutes in non-supine.. The supine AHI was 8.9 versus a non-supine AHI of 10.5.  OXYGEN SATURATION & C02:  The Wake baseline 02 saturation was 93%, with the lowest being 81%. Time spent below 89% saturation equaled 4 minutes. PERIODIC LIMB MOVEMENTS: The patient had a total of 0 Periodic Limb Movements.  The Periodic Limb Movement (PLM) index was 0 and the PLM Arousal index was 0/hour.  Audio and video analysis did not show any abnormal or unusual movements, behaviors, phonations or vocalizations. The patient took 1 bathroom break. Snoring was noted, and ranged from mild to loud. The EKG was in keeping with normal sinus rhythm (NSR) during wakefulness, but showed frequent PVCs, including bigeminy during sleep.   Post-study,  the patient indicated that sleep was the same as usual.   IMPRESSION:  1. Obstructive Sleep Apnea (OSA) 2. Dysfunctions associated with sleep stages or arousal from sleep 3. Non-specific abnormal EKG 4. Poor sleep pattern  RECOMMENDATIONS:  1. This study demonstrates overall mild obstructive sleep apnea, severe in REM sleep with a total AHI of 9.6/hour, REM AHI of 33.3/hour, and O2 nadir of 81%. The decreased  sleep efficiency and decreased percentage of REM sleep may have resulted in an underestimation of his sleep disordered breathing.  Given the patient's medical history and sleep related complaints, treatment with positive airway pressure is recommended; this can be achieved in the form of autoPAP. Alternatively, a full-night CPAP titration study would allow optimization of therapy if needed, down the road. Other treatment options may include avoidance of supine sleep position along with weight loss, upper airway or jaw surgery in selected patients or the use of an oral appliance in certain patients. ENT evaluation and/or consultation with a maxillofacial surgeon or dentist may be feasible in some instances.    2. Please note that untreated obstructive sleep apnea may carry additional perioperative morbidity. Patients with significant obstructive sleep apnea should receive perioperative PAP therapy and the surgeons and particularly the anesthesiologist should be informed of the diagnosis and the severity of the sleep disordered breathing. 3. This study shows sleep fragmentation and abnormal sleep stage percentages; these are nonspecific findings and per se do not signify an intrinsic sleep disorder or a cause for the patient's sleep-related symptoms. Causes include (but are not limited to) the first night effect of the sleep study, circadian rhythm disturbances, medication effect or an underlying mood disorder or medical problem.  4. The study showed frequent PVCs including bigeminy during sleep on single lead EKG; clinical correlation is recommended and consultation with cardiology may be feasible.  5. The patient should be cautioned not to drive, work at heights, or operate dangerous or heavy equipment when tired or sleepy. Review and reiteration of good sleep hygiene measures should be pursued with any patient. 6. The patient will be seen in follow-up by Dr. Rexene Alberts at [facility] for discussion of the test results and  further management strategies. The referring provider will be notified of the test results.  I certify that I have reviewed the entire raw data recording prior to the issuance of this report in accordance with the Standards of Accreditation of the American Academy of Sleep Medicine (AASM)  Star Age, MD, PhD Diplomat, American Board of Neurology and Sleep Medicine (Neurology and Sleep Medicine)

## 2020-11-10 NOTE — Addendum Note (Signed)
Addended by: Star Age on: 11/10/2020 06:00 PM   Modules accepted: Orders

## 2020-11-13 ENCOUNTER — Telehealth: Payer: Self-pay

## 2020-11-13 NOTE — Telephone Encounter (Signed)
-----   Message from Star Age, MD sent at 11/10/2020  6:00 PM EST ----- Patient referred by Dr. Krista Blue and Judson Roch for concern for OSA, seen by me on 10/28/20, diagnostic PSG on 11/03/20.   Please call and notify the patient that the recent sleep study showed obstructive sleep apnea but unfortunately he did not sleep very much or very well.  He had very little dream sleep and had significant sleep disruption.  Overall sleep apnea was in the mild range but was much more noticeable during dream sleep.  He also had abnormalities on the EKG including extra beats which recall PVCs and I would recommend since these were fairly significant and frequent that he make a follow-up appointment with his primary care physician for a proper EKG and potentially a referral to a cardiologist.  His extra beats were more noticeable during sleep.  I would recommend treatment in the form of an AutoPap machine.  This means that he does not have to come back for second sleep study but we can get him started on treatment at home with a so-called AutoPap machine through a local durable medical equipment company.  Once he is started on treatment, he will need a follow-up within 30 to 90 days for checkup and compliance review.  He can get scheduled with Judson Roch or myself.   Star Age, MD, PhD [facility])

## 2020-11-13 NOTE — Telephone Encounter (Signed)
Patient returned my call and we discussed his sleep study results.  Patient had several questions about machine and how long he would have to use the machine.  I reviewed patient's questions and concerns and patient requested that he call back on Monday, February 21 after speaking with his wife about starting the auto CPAP.  Patient was advised I would wait to hear from him and then we could reassess.  I did discuss the PVCs noted on the EKG patient was advised I would send report to primary care physician and to call with Dr. Berdine Addison about possible cardiology referral.  Patient verbalized understanding on this.  We will wait to hear from patient on Monday.

## 2020-11-13 NOTE — Telephone Encounter (Signed)
I called pt. No answer, left a message asking pt to call me back.   

## 2020-11-17 DIAGNOSIS — I679 Cerebrovascular disease, unspecified: Secondary | ICD-10-CM | POA: Diagnosis not present

## 2020-11-17 DIAGNOSIS — H25812 Combined forms of age-related cataract, left eye: Secondary | ICD-10-CM | POA: Diagnosis not present

## 2020-11-17 DIAGNOSIS — H40023 Open angle with borderline findings, high risk, bilateral: Secondary | ICD-10-CM | POA: Diagnosis not present

## 2020-11-17 DIAGNOSIS — Z961 Presence of intraocular lens: Secondary | ICD-10-CM | POA: Diagnosis not present

## 2020-11-17 DIAGNOSIS — H53462 Homonymous bilateral field defects, left side: Secondary | ICD-10-CM | POA: Diagnosis not present

## 2021-01-01 DIAGNOSIS — N189 Chronic kidney disease, unspecified: Secondary | ICD-10-CM | POA: Diagnosis not present

## 2021-01-01 DIAGNOSIS — Z79899 Other long term (current) drug therapy: Secondary | ICD-10-CM | POA: Diagnosis not present

## 2021-01-01 DIAGNOSIS — Z8673 Personal history of transient ischemic attack (TIA), and cerebral infarction without residual deficits: Secondary | ICD-10-CM | POA: Diagnosis not present

## 2021-01-01 DIAGNOSIS — M150 Primary generalized (osteo)arthritis: Secondary | ICD-10-CM | POA: Diagnosis not present

## 2021-01-01 DIAGNOSIS — M1A09X1 Idiopathic chronic gout, multiple sites, with tophus (tophi): Secondary | ICD-10-CM | POA: Diagnosis not present

## 2021-01-12 DIAGNOSIS — I69354 Hemiplegia and hemiparesis following cerebral infarction affecting left non-dominant side: Secondary | ICD-10-CM | POA: Diagnosis not present

## 2021-01-12 DIAGNOSIS — R569 Unspecified convulsions: Secondary | ICD-10-CM | POA: Diagnosis not present

## 2021-01-12 DIAGNOSIS — I10 Essential (primary) hypertension: Secondary | ICD-10-CM | POA: Diagnosis not present

## 2021-01-12 DIAGNOSIS — N183 Chronic kidney disease, stage 3 unspecified: Secondary | ICD-10-CM | POA: Diagnosis not present

## 2021-01-12 DIAGNOSIS — E785 Hyperlipidemia, unspecified: Secondary | ICD-10-CM | POA: Diagnosis not present

## 2021-01-12 DIAGNOSIS — N1832 Chronic kidney disease, stage 3b: Secondary | ICD-10-CM | POA: Diagnosis not present

## 2021-01-12 DIAGNOSIS — Z7901 Long term (current) use of anticoagulants: Secondary | ICD-10-CM | POA: Diagnosis not present

## 2021-01-12 DIAGNOSIS — I5042 Chronic combined systolic (congestive) and diastolic (congestive) heart failure: Secondary | ICD-10-CM | POA: Diagnosis not present

## 2021-01-12 DIAGNOSIS — G8194 Hemiplegia, unspecified affecting left nondominant side: Secondary | ICD-10-CM | POA: Diagnosis not present

## 2021-01-12 DIAGNOSIS — G40509 Epileptic seizures related to external causes, not intractable, without status epilepticus: Secondary | ICD-10-CM | POA: Diagnosis not present

## 2021-02-03 DIAGNOSIS — Z20828 Contact with and (suspected) exposure to other viral communicable diseases: Secondary | ICD-10-CM | POA: Diagnosis not present

## 2021-03-19 ENCOUNTER — Telehealth: Payer: Self-pay

## 2021-03-19 NOTE — Telephone Encounter (Signed)
LVM  to r/s appt , held slots available on 03/23/21

## 2021-03-23 ENCOUNTER — Ambulatory Visit (INDEPENDENT_AMBULATORY_CARE_PROVIDER_SITE_OTHER): Payer: Medicare Other | Admitting: Neurology

## 2021-03-23 ENCOUNTER — Encounter: Payer: Self-pay | Admitting: Neurology

## 2021-03-23 VITALS — BP 142/77 | HR 67 | Ht 68.0 in | Wt 195.0 lb

## 2021-03-23 DIAGNOSIS — G4730 Sleep apnea, unspecified: Secondary | ICD-10-CM

## 2021-03-23 DIAGNOSIS — G40909 Epilepsy, unspecified, not intractable, without status epilepticus: Secondary | ICD-10-CM

## 2021-03-23 MED ORDER — LEVETIRACETAM ER 750 MG PO TB24: 1500.0000 mg | ORAL_TABLET | Freq: Every day | ORAL | 4 refills | Status: DC

## 2021-03-23 NOTE — Patient Instructions (Signed)
Continue Keppra  Call for any seizures Let me know if you change your mind at about CPAP See you back in 1 year

## 2021-03-23 NOTE — Progress Notes (Signed)
PATIENT: Mitchell Comber Sr. DOB: August 22, 1951  REASON FOR VISIT: follow up HISTORY FROM: patient Primary Neurologist: Dr. Krista Blue for seizures, Dr. Rexene Alberts for sleep   HISTORY Copied Dr. Guadelupe Sabin note 10/28/20: I saw your patient, Mitchell [name], upon your kind request in the sleep clinic today for initial consultation of his sleep disorder, in particular, concern for underlying obstructive sleep apnea.  The patient is accompanied by his wife today. As you know, Mitchell [name] is a 70 year old right-handed gentleman with an underlying medical history of stroke, seizure disorder, hyperlipidemia, hypertension, thrombocytopenia, anemia, alcohol use disorder, gout, and overweight state, who reports snoring and excessive daytime somnolence.  I reviewed your office note from 09/23/2020.  His Epworth sleepiness score is 5 out of 24, which may be an underestimation.  His wife reports that he tends to stay in bed most of the day, he denies it.  He reports a bedtime of around 10 PM and rise time of around 8 or 9 AM.  He is not aware of any family history of sleep apnea.  He would be willing to come in stay overnight for sleep testing.  He denies recurrent morning headaches.  He has nocturia about 3 times per average night.  He quit drinking alcohol in 2019, he quit smoking in the 70s.  He drinks no caffeine on a daily basis, decaf coffee.  Update 03/23/21 SS: Sleep study, showed OSA, but sleep quality was poor. Sleep apnea was in mild range, showed EKG abnormalities (PVCs), hasn't seen PCP. Says today not interested in starting CPAP. Remains on Keppra, doing well, no seizures. Got brace to left hand for finger flexion, most of the time he doesn't wear, but is today. Most of sleep issue is drowsiness during the day.   REVIEW OF SYSTEMS: Out of a complete 14 system review of symptoms, the patient complains only of the following symptoms, and all other reviewed systems are negative.  See HPI  ALLERGIES: Allergies  Allergen Reactions    Hydrochlorothiazide     REACTION: precipitates gout   Pseudoephedrine     REACTION: rash    HOME MEDICATIONS: Outpatient Medications Prior to Visit  Medication Sig Dispense Refill   acetaminophen (TYLENOL) 325 MG tablet Take 2 tablets (650 mg total) by mouth every 6 (six) hours as needed for mild pain, moderate pain or fever (or Fever >/= 101).     allopurinol (ZYLOPRIM) 100 MG tablet Take 100 mg by mouth daily.     amLODipine (NORVASC) 10 MG tablet Take 1 tablet (10 mg total) by mouth daily. 30 tablet 0   calcium-vitamin D (OSCAL WITH D) 500-200 MG-UNIT tablet Take 1 tablet by mouth 2 (two) times daily. 60 tablet 0   carvedilol (COREG) 25 MG tablet Take 1 tablet (25 mg total) by mouth 2 (two) times daily with a meal. 60 tablet 0   ferrous sulfate 325 (65 FE) MG tablet Take 1 tablet (325 mg total) by mouth daily with breakfast. 30 tablet 0   folic acid (FOLVITE) 1 MG tablet Take 1 tablet (1 mg total) by mouth daily. 30 tablet 0   furosemide (LASIX) 20 MG tablet Take 1 tablet (20 mg total) by mouth daily. 30 tablet 0   Levetiracetam (KEPPRA XR) 750 MG TB24 Take 2 tablets (1,500 mg total) by mouth at bedtime. 180 tablet 4   magnesium oxide (MAG-OX) 400 MG tablet Take 1 tablet (400 mg total) by mouth daily. 30 tablet 0   multivitamin (PROSIGHT) TABS tablet Take  1 tablet by mouth daily. 30 each 0   pantoprazole (PROTONIX) 40 MG tablet Take 1 tablet (40 mg total) by mouth daily. 30 tablet 0   rivaroxaban (XARELTO) 20 MG TABS tablet Take 1 tablet (20 mg total) by mouth daily with supper. 30 tablet 0   thiamine 100 MG tablet Take 1 tablet (100 mg total) by mouth daily. 30 tablet 0   No facility-administered medications prior to visit.    PAST MEDICAL HISTORY: Past Medical History:  Diagnosis Date   Alcohol abuse    Anemia    Anoxic brain injury ([place])    Chronic pulmonary edema    CVA (cerebral vascular accident) ([place])    Dysphagia    Gout    Hemiplegia affecting left dominant side  ([place])    History of stroke    Hyperlipemia    Hypertension    Seizures ([place])    Thrombocytopenia ([place])     PAST SURGICAL HISTORY: Past Surgical History:  Procedure Laterality Date   lymph nodes     biopsy - benign   RADIOLOGY WITH ANESTHESIA N/A 07/17/2015   Procedure: RADIOLOGY WITH ANESTHESIA;  Surgeon: Medication Radiologist, MD;  Location: [facility];  Service: Radiology;  Laterality: N/A;    FAMILY HISTORY: Family History  Problem Relation Age of Onset   Aneurysm Sister    Clotting disorder Brother    Aneurysm Mother    Heart attack Father     SOCIAL HISTORY: Social History   Socioeconomic History   Marital status: Married    Spouse name: Not on file   Number of children: 2   Years of education: 11th grade   Highest education level: Not on file  Occupational History   Occupation: Retired  Tobacco Use   Smoking status: Former    Pack years: 0.00    Types: Cigars   Smokeless tobacco: Never  Scientific laboratory technician Use: Never used  Substance and Sexual Activity   Alcohol use: Not Currently    Comment: History of alcohol abuse   Drug use: Not Currently    Types: Marijuana   Sexual activity: Yes  Other Topics Concern   Not on file  Social History Narrative   He is currently in rehab following a fall.   He is hoping to be discharged home soon.      Lives with his wife at home.   Right-handed.   Drinks decaf coffee   Social Determinants of Radio broadcast assistant Strain: Not on file  Food Insecurity: Not on file  Transportation Needs: Not on file  Physical Activity: Not on file  Stress: Not on file  Social Connections: Not on file  Intimate Partner Violence: Not on file   PHYSICAL EXAM  Vitals:   03/23/21 1306  BP: (!) 142/77  Pulse: 67  Weight: 195 lb (88.5 kg)  Height: 5\' 8"  (1.727 m)   Body mass index is 29.65 kg/m.  Generalized: Well developed, in no acute distress  Neurological examination  Mentation: Alert oriented to time, place,  history taking. Follows all commands speech and language fluent Cranial nerve II-XII: Pupils were equal round reactive to light. Extraocular movements were full, visual field were full on confrontational test. Facial sensation and strength were normal. Head turning and shoulder shrug  were normal and symmetric. Motor: Spastic left hemiparesis 3/5 left upper and lower extremity, wearing left hand brace for flexion  Sensory: Decreased soft touch sensation to left upper extremity Coordination: Spastic with left  upper finger nose finger Gait and station: Spastic type gait on the left, uses single-point cane, unsteady  DIAGNOSTIC DATA (LABS, IMAGING, TESTING) - I reviewed patient records, labs, notes, testing and imaging myself where available.  Lab Results  Component Value Date   WBC 6.9 06/26/2018   HGB 8.8 (A) 06/26/2018   HCT 26 (A) 06/26/2018   MCV 97.2 06/17/2018   PLT 294 06/26/2018      Component Value Date/Time   NA 141 06/27/2018 0000   K 4.7 06/27/2018 0000   CL 111 06/17/2018 1140   CO2 21 (L) 06/17/2018 1140   GLUCOSE 105 (H) 06/17/2018 1140   BUN 22 (A) 06/27/2018 0000   CREATININE 1.6 (A) 06/27/2018 0000   CREATININE 1.90 (H) 06/17/2018 1140   CALCIUM 8.6 (L) 06/17/2018 1140   PROT 7.1 06/07/2018 1645   ALBUMIN 2.3 (L) 06/13/2018 0500   AST 36 06/21/2018 0000   ALT 15 06/21/2018 0000   ALKPHOS 67 06/21/2018 0000   BILITOT 1.0 06/07/2018 1645   GFRNONAA 35 (L) 06/17/2018 1140   GFRAA 40 (L) 06/17/2018 1140   Lab Results  Component Value Date   CHOL 165 06/08/2018   HDL 86 06/08/2018   LDLCALC 62 06/08/2018   TRIG 87 06/08/2018   CHOLHDL 1.9 06/08/2018   Lab Results  Component Value Date   HGBA1C 5.2 06/08/2018   No results found for: VITAMINB12 No results found for: TSH  ASSESSMENT AND PLAN 70 y.o. year old male  has a past medical history of Alcohol abuse, Anemia, Anoxic brain injury ([place]), Chronic pulmonary edema, CVA (cerebral vascular accident)  ([place]), Dysphagia, Gout, Hemiplegia affecting left dominant side ([place]), History of stroke, Hyperlipemia, Hypertension, Seizures ([place]), and Thrombocytopenia ([place]). here with:  1.  History of right parietal lobe infarction, with residual spastic left hemiparesis 2.  History of heavy alcohol abuse 3.  Partial status epilepticus -No recent seizure spells since 2019, continue Keppra XR 750 mg, 2 tablets at bedtime for seizure prevention  -EEG showed background slowing, no epileptiform discharge -Follow-up in 1 year or sooner if needed, call for any seizure type event  4. OSA  -Sleep study showed mild OSA, but was poor quality sleep -He has decided not to pursue CPAP, does not think he would use, he will follow-up with his PCP regarding EKG abnormalities -Will let me know if he changes his mind about CPAP use, could benefit to improve daytime drowsiness  I spent 32 minutes of face-to-face and non-face-to-face time with patient.  This included previsit chart review, lab review, study review, reviewing sleep study, benefit of CPAP, seizure medication, management and follow-up.  Butler Denmark, AGNP-C, DNP 03/23/2021, 1:14 PM [facility] [address], [place], [place] [address] [phone]

## 2021-03-24 ENCOUNTER — Ambulatory Visit: Payer: Medicare Other | Admitting: Neurology

## 2021-04-05 DIAGNOSIS — Z20822 Contact with and (suspected) exposure to covid-19: Secondary | ICD-10-CM | POA: Diagnosis not present

## 2021-04-13 DIAGNOSIS — G8194 Hemiplegia, unspecified affecting left nondominant side: Secondary | ICD-10-CM | POA: Diagnosis not present

## 2021-04-13 DIAGNOSIS — M10041 Idiopathic gout, right hand: Secondary | ICD-10-CM | POA: Diagnosis not present

## 2021-04-13 DIAGNOSIS — M10042 Idiopathic gout, left hand: Secondary | ICD-10-CM | POA: Diagnosis not present

## 2021-04-13 DIAGNOSIS — Z7901 Long term (current) use of anticoagulants: Secondary | ICD-10-CM | POA: Diagnosis not present

## 2021-04-13 DIAGNOSIS — I5042 Chronic combined systolic (congestive) and diastolic (congestive) heart failure: Secondary | ICD-10-CM | POA: Diagnosis not present

## 2021-04-13 DIAGNOSIS — N183 Chronic kidney disease, stage 3 unspecified: Secondary | ICD-10-CM | POA: Diagnosis not present

## 2021-04-13 DIAGNOSIS — N1832 Chronic kidney disease, stage 3b: Secondary | ICD-10-CM | POA: Diagnosis not present

## 2021-04-13 DIAGNOSIS — E1169 Type 2 diabetes mellitus with other specified complication: Secondary | ICD-10-CM | POA: Diagnosis not present

## 2021-04-13 DIAGNOSIS — G40509 Epileptic seizures related to external causes, not intractable, without status epilepticus: Secondary | ICD-10-CM | POA: Diagnosis not present

## 2021-04-13 DIAGNOSIS — I10 Essential (primary) hypertension: Secondary | ICD-10-CM | POA: Diagnosis not present

## 2021-04-13 DIAGNOSIS — G40909 Epilepsy, unspecified, not intractable, without status epilepticus: Secondary | ICD-10-CM | POA: Diagnosis not present

## 2021-04-26 DIAGNOSIS — I130 Hypertensive heart and chronic kidney disease with heart failure and stage 1 through stage 4 chronic kidney disease, or unspecified chronic kidney disease: Secondary | ICD-10-CM | POA: Diagnosis not present

## 2021-04-26 DIAGNOSIS — E559 Vitamin D deficiency, unspecified: Secondary | ICD-10-CM | POA: Diagnosis not present

## 2021-04-26 DIAGNOSIS — I5042 Chronic combined systolic (congestive) and diastolic (congestive) heart failure: Secondary | ICD-10-CM | POA: Diagnosis not present

## 2021-04-26 DIAGNOSIS — N183 Chronic kidney disease, stage 3 unspecified: Secondary | ICD-10-CM | POA: Diagnosis not present

## 2021-05-05 ENCOUNTER — Other Ambulatory Visit: Payer: Self-pay | Admitting: Family Medicine

## 2021-05-05 DIAGNOSIS — R944 Abnormal results of kidney function studies: Secondary | ICD-10-CM

## 2021-05-08 ENCOUNTER — Other Ambulatory Visit: Payer: Self-pay

## 2021-05-08 ENCOUNTER — Ambulatory Visit
Admission: RE | Admit: 2021-05-08 | Discharge: 2021-05-08 | Disposition: A | Discharge status: Home / Self Care | Payer: Medicare Other | Source: Ambulatory Visit | Attending: Family Medicine | Admitting: Family Medicine

## 2021-05-08 DIAGNOSIS — N281 Cyst of kidney, acquired: Secondary | ICD-10-CM | POA: Diagnosis not present

## 2021-05-08 DIAGNOSIS — R944 Abnormal results of kidney function studies: Secondary | ICD-10-CM

## 2021-05-17 DIAGNOSIS — Z20828 Contact with and (suspected) exposure to other viral communicable diseases: Secondary | ICD-10-CM | POA: Diagnosis not present

## 2021-06-12 DIAGNOSIS — U071 COVID-19: Secondary | ICD-10-CM | POA: Diagnosis not present

## 2021-06-26 DIAGNOSIS — N183 Chronic kidney disease, stage 3 unspecified: Secondary | ICD-10-CM | POA: Diagnosis not present

## 2021-06-26 DIAGNOSIS — I129 Hypertensive chronic kidney disease with stage 1 through stage 4 chronic kidney disease, or unspecified chronic kidney disease: Secondary | ICD-10-CM | POA: Diagnosis not present

## 2021-06-26 DIAGNOSIS — I5042 Chronic combined systolic (congestive) and diastolic (congestive) heart failure: Secondary | ICD-10-CM | POA: Diagnosis not present

## 2021-06-26 DIAGNOSIS — E559 Vitamin D deficiency, unspecified: Secondary | ICD-10-CM | POA: Diagnosis not present

## 2021-07-02 ENCOUNTER — Emergency Department (HOSPITAL_COMMUNITY)
Admission: EM | Admit: 2021-07-02 | Discharge: 2021-07-02 | Disposition: A | Discharge status: Home / Self Care | Payer: Medicare Other | Attending: Emergency Medicine | Admitting: Emergency Medicine

## 2021-07-02 ENCOUNTER — Other Ambulatory Visit: Payer: Self-pay

## 2021-07-02 ENCOUNTER — Emergency Department (HOSPITAL_COMMUNITY): Payer: Medicare Other

## 2021-07-02 ENCOUNTER — Encounter (HOSPITAL_COMMUNITY): Payer: Self-pay

## 2021-07-02 DIAGNOSIS — S0083XA Contusion of other part of head, initial encounter: Secondary | ICD-10-CM

## 2021-07-02 DIAGNOSIS — S40011A Contusion of right shoulder, initial encounter: Secondary | ICD-10-CM

## 2021-07-02 DIAGNOSIS — W010XXA Fall on same level from slipping, tripping and stumbling without subsequent striking against object, initial encounter: Secondary | ICD-10-CM

## 2021-07-02 DIAGNOSIS — S12400A Unspecified displaced fracture of fifth cervical vertebra, initial encounter for closed fracture: Secondary | ICD-10-CM

## 2021-07-02 DIAGNOSIS — S0990XA Unspecified injury of head, initial encounter: Secondary | ICD-10-CM

## 2021-07-02 DIAGNOSIS — M47812 Spondylosis without myelopathy or radiculopathy, cervical region: Secondary | ICD-10-CM | POA: Diagnosis not present

## 2021-07-02 DIAGNOSIS — I672 Cerebral atherosclerosis: Secondary | ICD-10-CM | POA: Diagnosis not present

## 2021-07-02 DIAGNOSIS — S12490A Other displaced fracture of fifth cervical vertebra, initial encounter for closed fracture: Secondary | ICD-10-CM | POA: Diagnosis not present

## 2021-07-02 DIAGNOSIS — Z7901 Long term (current) use of anticoagulants: Secondary | ICD-10-CM | POA: Diagnosis not present

## 2021-07-02 DIAGNOSIS — Z23 Encounter for immunization: Secondary | ICD-10-CM | POA: Diagnosis not present

## 2021-07-02 DIAGNOSIS — M542 Cervicalgia: Secondary | ICD-10-CM | POA: Diagnosis not present

## 2021-07-02 DIAGNOSIS — I10 Essential (primary) hypertension: Secondary | ICD-10-CM | POA: Diagnosis not present

## 2021-07-02 DIAGNOSIS — W01198A Fall on same level from slipping, tripping and stumbling with subsequent striking against other object, initial encounter: Secondary | ICD-10-CM | POA: Diagnosis not present

## 2021-07-02 DIAGNOSIS — S14105A Unspecified injury at C5 level of cervical spinal cord, initial encounter: Secondary | ICD-10-CM | POA: Diagnosis present

## 2021-07-02 DIAGNOSIS — W19XXXA Unspecified fall, initial encounter: Secondary | ICD-10-CM | POA: Diagnosis not present

## 2021-07-02 DIAGNOSIS — M25512 Pain in left shoulder: Secondary | ICD-10-CM | POA: Diagnosis not present

## 2021-07-02 DIAGNOSIS — S12500A Unspecified displaced fracture of sixth cervical vertebra, initial encounter for closed fracture: Secondary | ICD-10-CM | POA: Diagnosis not present

## 2021-07-02 DIAGNOSIS — M7989 Other specified soft tissue disorders: Secondary | ICD-10-CM | POA: Diagnosis not present

## 2021-07-02 DIAGNOSIS — I6529 Occlusion and stenosis of unspecified carotid artery: Secondary | ICD-10-CM | POA: Diagnosis not present

## 2021-07-02 DIAGNOSIS — M25511 Pain in right shoulder: Secondary | ICD-10-CM | POA: Diagnosis not present

## 2021-07-02 HISTORY — DX: Unspecified convulsions: R56.9

## 2021-07-02 HISTORY — DX: Cerebral infarction, unspecified: I63.9

## 2021-07-02 HISTORY — DX: Essential (primary) hypertension: I10

## 2021-07-02 MED ORDER — TRAMADOL HCL 50 MG PO TABS: 50.0000 mg | ORAL_TABLET | Freq: Four times a day (QID) | ORAL | 0 refills | Status: DC | PRN

## 2021-07-02 MED ORDER — TETANUS-DIPHTH-ACELL PERTUSSIS 5-2.5-18.5 LF-MCG/0.5 IM SUSY
0.5000 mL | PREFILLED_SYRINGE | Freq: Once | INTRAMUSCULAR | Status: AC
  Administered 2021-07-02: 0.5 mL via INTRAMUSCULAR
  Filled 2021-07-02: qty 0.5

## 2021-07-02 MED ORDER — MORPHINE SULFATE (PF) 4 MG/ML IV SOLN
4.0000 mg | Freq: Once | INTRAVENOUS | Status: AC
  Administered 2021-07-02: 4 mg via INTRAVENOUS
  Filled 2021-07-02: qty 1

## 2021-07-02 MED ORDER — ONDANSETRON HCL 4 MG/2ML IJ SOLN
4.0000 mg | Freq: Once | INTRAMUSCULAR | Status: AC
  Administered 2021-07-02: 4 mg via INTRAVENOUS
  Filled 2021-07-02: qty 2

## 2021-07-02 MED ORDER — HYDROMORPHONE HCL 1 MG/ML IJ SOLN
0.5000 mg | Freq: Once | INTRAMUSCULAR | Status: AC
Start: 2021-07-02 — End: 2021-07-02
  Administered 2021-07-02: 0.5 mg via INTRAVENOUS
  Filled 2021-07-02: qty 1

## 2021-07-02 NOTE — ED Provider Notes (Signed)
[facility] EMERGENCY DEPARTMENT Provider Note   CSN: [id number] Arrival date & time: 07/02/21  1020     History Chief Complaint  Patient presents with   Mitchell [name] is a 70 y.o. male.  Patient s/p trip and fall. Symptoms acute onset this AM, at home, episodic, was trying to move rug w cane and rug moved/slid, pt fell forward against wall, hit head into wall. No loc. Is on xarelto. Contusions to forehead, pain to area. Abrasion to nose. Last tetanus unknown. Also c/o right shoulder pain, constant, dull, non radiating. Prior to slip and fall, felt fine, asymptomatic. No faintness or dizziness prior to fall. No neck/back pain. No radicular pain. No numbness/weakness. No chest pain or sob. No abd pain or nv. Denies other extremity pain or injury.   The history is provided by the patient, the spouse and the EMS personnel.  Fall Pertinent negatives include no chest pain, no abdominal pain, no headaches and no shortness of breath.      Past Medical History:  Diagnosis Date   Hypertension    Seizures ([place])    Stroke ([place])     There are no problems to display for this patient.   History reviewed. No pertinent surgical history.     History reviewed. No pertinent family history.  Social History   Tobacco Use   Smoking status: Never  Substance Use Topics   Alcohol use: Never   Drug use: Never    Home Medications Prior to Admission medications   Not on File    Allergies    Patient has no allergy information on record.  Review of Systems   Review of Systems  Constitutional:  Negative for fever.  HENT:  Negative for nosebleeds and sore throat.   Eyes:  Negative for pain and visual disturbance.  Respiratory:  Negative for shortness of breath.   Cardiovascular:  Negative for chest pain.  Gastrointestinal:  Negative for abdominal pain, nausea and vomiting.  Genitourinary:  Negative for flank pain.  Musculoskeletal:  Negative for back pain  and neck pain.  Skin:  Negative for rash.  Neurological:  Negative for headaches.  Hematological:        On xarelto.   Psychiatric/Behavioral:  Negative for confusion.    Physical Exam Updated Vital Signs BP (!) 151/76   Pulse 64   Temp 98.2 F (36.8 C) (Oral)   Resp 14   Ht 1.727 m (5\' 8" )   Wt 86.2 kg   SpO2 99%   BMI 28.89 kg/m   Physical Exam Vitals and nursing note reviewed.  Constitutional:      Appearance: Normal appearance. He is well-developed.  HENT:     Head:     Comments: Large contusions to forehead. Superficial abrasion to nose. No nasal septal hematoma. Facial bones grossly intact.     Nose: Nose normal.     Mouth/Throat:     Mouth: Mucous membranes are moist.     Pharynx: Oropharynx is clear.  Eyes:     General: No scleral icterus.    Conjunctiva/sclera: Conjunctivae normal.     Pupils: Pupils are equal, round, and reactive to light.  Neck:     Vascular: No carotid bruit.     Trachea: No tracheal deviation.     Comments: Ccollar. Trachea midline.  Cardiovascular:     Rate and Rhythm: Normal rate and regular rhythm.     Pulses: Normal pulses.     Heart  sounds: Normal heart sounds. No murmur heard.   No friction rub. No gallop.  Pulmonary:     Effort: Pulmonary effort is normal. No accessory muscle usage or respiratory distress.     Breath sounds: Normal breath sounds.  Chest:     Chest wall: No tenderness.  Abdominal:     General: Bowel sounds are normal. There is no distension.     Palpations: Abdomen is soft.     Tenderness: There is no abdominal tenderness. There is no guarding.     Comments: No abd pain, bruising, contusion, or tenderness.   Genitourinary:    Comments: No cva tenderness. Musculoskeletal:        General: No swelling.     Cervical back: Normal range of motion and neck supple. No rigidity.     Comments: Mid cervical tenderness, otherwise, CTLS spine, non tender, aligned, no step off. Tenderness left shoulder, no other focal  bony tenderness on bilateral extremity exam.   Skin:    General: Skin is warm and dry.     Findings: No rash.  Neurological:     Mental Status: He is alert.     Comments: Alert, speech clear. GCS 15. Motor/sens grossly intact bil.   Psychiatric:        Mood and Affect: Mood normal.    ED Results / Procedures / Treatments   Labs (all labs ordered are listed, but only abnormal results are displayed) Labs Reviewed - No data to display  EKG None  Radiology DG Shoulder Right  Result Date: 07/02/2021 CLINICAL DATA:  Pain, fall EXAM: RIGHT SHOULDER - 2+ VIEW COMPARISON:  None. FINDINGS: There is no acute fracture or dislocation. High-riding humeral head with subacromial spurring and heterotopic ossification along the acromial humeral interval. There is moderate glenohumeral arthritis. There is moderate AC joint arthritis. IMPRESSION: No acute fracture or dislocation. Findings of chronic distal rotator cuff disease/likely tear. Moderate glenohumeral and AC joint arthritis. Electronically Signed   By: Maurine Simmering M.D.   On: 07/02/2021 11:46   CT HEAD WO CONTRAST (5MM)  Result Date: 07/02/2021 CLINICAL DATA:  Tripped and fell with trauma to the head and neck. EXAM: CT HEAD WITHOUT CONTRAST CT CERVICAL SPINE WITHOUT CONTRAST TECHNIQUE: Multidetector CT imaging of the head and cervical spine was performed following the standard protocol without intravenous contrast. Multiplanar CT image reconstructions of the cervical spine were also generated. COMPARISON:  None. FINDINGS: CT HEAD FINDINGS Brain: No acute intracranial finding. No abnormality affects the brainstem, other than Wallerian degeneration on the right. No cerebellar abnormality. Left cerebral hemisphere shows mild small vessel change of the white matter. On the right, there is been old infarction in the posterior temporal, parietal and occipital region with encephalomalacia and ex vacuo enlargement of the right lateral ventricle. No sign of  obstructive hydrocephalus. No extra-axial collection. Vascular: There is atherosclerotic calcification of the major vessels at the base of the brain. Skull: No skull fracture. Sinuses/Orbits: Clear/normal Other: Forehead hematoma. CT CERVICAL SPINE FINDINGS Alignment: No traumatic malalignment. Skull base and vertebrae: Fracture of the anterior inferior corner of the C5 vertebral body. Soft tissues and spinal canal: Mild prevertebral soft tissue swelling. Ordinary carotid bifurcation calcification. Disc levels: Foramen magnum is widely patent. Chronic arthritic change at the C1-2 articulation particularly affecting the lateral masses on the right and to a lesser extent the articulation of the anterior arch of C1 and the dens. C2-3: Chronic fusion.  No compressive stenosis. C3-4: Disc space narrowing with endplate osteophytes.  Sufficient patency of the canal and foramina. C4-5: Chronic facet fusion. Sufficient patency of the canal and foramina. C5-6: Spondylosis with endplate osteophytes. Mild bilateral foraminal narrowing. Acute fracture of the anterior inferior corner of C5 as noted above. C6-7: Chronic disc degeneration with endplate osteophytes. Mild bilateral foraminal stenosis. C7-T1: Bilateral facet osteoarthritis.  No stenosis. Upper chest: Negative Other: None IMPRESSION: Head CT: No acute intracranial finding. Atrophy and chronic small-vessel ischemic change. Forehead soft tissue swelling. No underlying skull fracture. Cervical spine CT: Acute fracture of the anterior inferior corner of the C5 vertebral body. Mild prevertebral edema. Extensive degenerative changes at the C1-2 articulation. Chronic fusion at C2-3 and C4-5. These results were called by telephone at the time of interpretation on 07/02/2021 at 11:20 am to provider [facility] , who verbally acknowledged these results. Electronically Signed   By: Nelson Chimes M.D.   On: 07/02/2021 11:23   CT CERVICAL SPINE WO CONTRAST  Result Date:  07/02/2021 CLINICAL DATA:  Tripped and fell with trauma to the head and neck. EXAM: CT HEAD WITHOUT CONTRAST CT CERVICAL SPINE WITHOUT CONTRAST TECHNIQUE: Multidetector CT imaging of the head and cervical spine was performed following the standard protocol without intravenous contrast. Multiplanar CT image reconstructions of the cervical spine were also generated. COMPARISON:  None. FINDINGS: CT HEAD FINDINGS Brain: No acute intracranial finding. No abnormality affects the brainstem, other than Wallerian degeneration on the right. No cerebellar abnormality. Left cerebral hemisphere shows mild small vessel change of the white matter. On the right, there is been old infarction in the posterior temporal, parietal and occipital region with encephalomalacia and ex vacuo enlargement of the right lateral ventricle. No sign of obstructive hydrocephalus. No extra-axial collection. Vascular: There is atherosclerotic calcification of the major vessels at the base of the brain. Skull: No skull fracture. Sinuses/Orbits: Clear/normal Other: Forehead hematoma. CT CERVICAL SPINE FINDINGS Alignment: No traumatic malalignment. Skull base and vertebrae: Fracture of the anterior inferior corner of the C5 vertebral body. Soft tissues and spinal canal: Mild prevertebral soft tissue swelling. Ordinary carotid bifurcation calcification. Disc levels: Foramen magnum is widely patent. Chronic arthritic change at the C1-2 articulation particularly affecting the lateral masses on the right and to a lesser extent the articulation of the anterior arch of C1 and the dens. C2-3: Chronic fusion.  No compressive stenosis. C3-4: Disc space narrowing with endplate osteophytes. Sufficient patency of the canal and foramina. C4-5: Chronic facet fusion. Sufficient patency of the canal and foramina. C5-6: Spondylosis with endplate osteophytes. Mild bilateral foraminal narrowing. Acute fracture of the anterior inferior corner of C5 as noted above. C6-7:  Chronic disc degeneration with endplate osteophytes. Mild bilateral foraminal stenosis. C7-T1: Bilateral facet osteoarthritis.  No stenosis. Upper chest: Negative Other: None IMPRESSION: Head CT: No acute intracranial finding. Atrophy and chronic small-vessel ischemic change. Forehead soft tissue swelling. No underlying skull fracture. Cervical spine CT: Acute fracture of the anterior inferior corner of the C5 vertebral body. Mild prevertebral edema. Extensive degenerative changes at the C1-2 articulation. Chronic fusion at C2-3 and C4-5. These results were called by telephone at the time of interpretation on 07/02/2021 at 11:20 am to provider [facility] , who verbally acknowledged these results. Electronically Signed   By: Nelson Chimes M.D.   On: 07/02/2021 11:23    Procedures Procedures   Medications Ordered in ED Medications  ondansetron (ZOFRAN) injection 4 mg (4 mg Intravenous Given 07/02/21 1039)  morphine 4 MG/ML injection 4 mg (4 mg Intravenous Given 07/02/21 1039)  Tdap (  BOOSTRIX) injection 0.5 mL (0.5 mLs Intramuscular Given 07/02/21 1040)    ED Course  I have reviewed the triage vital signs and the nursing notes.  Pertinent labs & imaging results that were available during my care of the patient were reviewed by me and considered in my medical decision making (see chart for details).    MDM Rules/Calculators/A&P                           Iv ns. Imaging ordered.   Icepack to sore area. Morphine iv. Zofran iv. Tetanus im. Abrasion cleaned, bacitracin and sterile dressing applied.   Xrays reviewed/interpreted by me - no fx.   CT reviewed/interpreted by me - no hem. +C5 fracture. Discussed w pt.   Neurosurgery consulted - discussed ct - Dr Marcello Moores reviewed ct - he indicates aspen collar, outpatient f/u 4-6 weeks.   Recheck pt, pain controlled. No radicular pain. No numbness/weakness. Motor/sens grossly intact bil.   Ambulate in hall w assiist.   Po fluids/food.   Pt  currently appears stable for d/c.  CRITICAL CARE RE: Head injury, C5 fracture, stat neurosurgical consult, Aspen collar immobilization Performed by: Mirna Mires Total critical care time: 35 minutes Critical care time was exclusive of separately billable procedures and treating other patients. Critical care was necessary to treat or prevent imminent or life-threatening deterioration. Critical care was time spent personally by me on the following activities: development of treatment plan with patient and/or surrogate as well as nursing, discussions with consultants, evaluation of patient's response to treatment, examination of patient, obtaining history from patient or surrogate, ordering and performing treatments and interventions, ordering and review of laboratory studies, ordering and review of radiographic studies, pulse oximetry and re-evaluation of patient's condition.    Final Clinical Impression(s) / ED Diagnoses Final diagnoses:  None    Rx / DC Orders ED Discharge Orders     None        Lajean Saver, MD 07/02/21 1309

## 2021-07-02 NOTE — ED Triage Notes (Addendum)
Pt bib [facility] from home where he lives with his wife. Pt was walking with a walker this am when he tripped and fell face first in to the wall. Pt complains of head, neck, and Right shoulder pain. Pt AOx4, CNS intact. Pt takes xarelto for afib. Left side is weak from previous stroke. Pt is at baseline per family. EMS vitals: 176/110, 76 HR, 98% RA, 117CBG

## 2021-07-02 NOTE — ED Notes (Signed)
Pt stood up on the side of the bed to use urinal. Only complained of dizziness with standing.  Ginger ale given to pt with no complaints afterwards.

## 2021-07-02 NOTE — Discharge Instructions (Addendum)
It was our pleasure to provide your ER care today - we hope that you feel better.  Icepack to sore area. Wear cervical collar at all times.  Fall precautions - use extreme caution to help minimize risk of falling.   Take acetaminophen or ibuprofen as need. You may also take ultram as need for pain. No driving.   Follow up with neurosurgeon regarding your neck/C5 fracture in 4 weeks - call office to arrange appointment.   Follow up with primary care doctor in the next few weeks as relates your blood pressure which is mildly high today.   Return to ER if worse, new symptoms, new, severe, or intractable pain, severe headache, persistent vomiting, numbness/weakness, or other concern.

## 2021-07-02 NOTE — ED Notes (Signed)
Transported to CT 

## 2021-07-03 ENCOUNTER — Encounter: Payer: Self-pay | Admitting: Neurology

## 2021-07-06 DIAGNOSIS — S0083XA Contusion of other part of head, initial encounter: Secondary | ICD-10-CM | POA: Diagnosis not present

## 2021-07-06 DIAGNOSIS — S12401A Unspecified nondisplaced fracture of fifth cervical vertebra, initial encounter for closed fracture: Secondary | ICD-10-CM | POA: Diagnosis not present

## 2021-07-06 DIAGNOSIS — W01198A Fall on same level from slipping, tripping and stumbling with subsequent striking against other object, initial encounter: Secondary | ICD-10-CM | POA: Diagnosis not present

## 2021-07-06 DIAGNOSIS — S8001XA Contusion of right knee, initial encounter: Secondary | ICD-10-CM | POA: Diagnosis not present

## 2021-07-06 DIAGNOSIS — S40011A Contusion of right shoulder, initial encounter: Secondary | ICD-10-CM | POA: Diagnosis not present

## 2021-07-06 DIAGNOSIS — S0033XA Contusion of nose, initial encounter: Secondary | ICD-10-CM | POA: Diagnosis not present

## 2021-07-06 DIAGNOSIS — Z23 Encounter for immunization: Secondary | ICD-10-CM | POA: Diagnosis not present

## 2021-07-13 DIAGNOSIS — S12491A Other nondisplaced fracture of fifth cervical vertebra, initial encounter for closed fracture: Secondary | ICD-10-CM | POA: Diagnosis not present

## 2021-07-13 DIAGNOSIS — I10 Essential (primary) hypertension: Secondary | ICD-10-CM | POA: Diagnosis not present

## 2021-07-13 DIAGNOSIS — Z6827 Body mass index (BMI) 27.0-27.9, adult: Secondary | ICD-10-CM | POA: Diagnosis not present

## 2021-07-20 DIAGNOSIS — S40011D Contusion of right shoulder, subsequent encounter: Secondary | ICD-10-CM | POA: Diagnosis not present

## 2021-07-20 DIAGNOSIS — S8001XD Contusion of right knee, subsequent encounter: Secondary | ICD-10-CM | POA: Diagnosis not present

## 2021-07-20 DIAGNOSIS — W01198D Fall on same level from slipping, tripping and stumbling with subsequent striking against other object, subsequent encounter: Secondary | ICD-10-CM | POA: Diagnosis not present

## 2021-07-20 DIAGNOSIS — F5221 Male erectile disorder: Secondary | ICD-10-CM | POA: Diagnosis not present

## 2021-07-20 DIAGNOSIS — S12401D Unspecified nondisplaced fracture of fifth cervical vertebra, subsequent encounter for fracture with routine healing: Secondary | ICD-10-CM | POA: Diagnosis not present

## 2021-07-20 DIAGNOSIS — S0033XD Contusion of nose, subsequent encounter: Secondary | ICD-10-CM | POA: Diagnosis not present

## 2021-07-20 DIAGNOSIS — W01198A Fall on same level from slipping, tripping and stumbling with subsequent striking against other object, initial encounter: Secondary | ICD-10-CM | POA: Diagnosis not present

## 2021-07-20 DIAGNOSIS — Z0001 Encounter for general adult medical examination with abnormal findings: Secondary | ICD-10-CM | POA: Diagnosis not present

## 2021-07-20 DIAGNOSIS — S0083XD Contusion of other part of head, subsequent encounter: Secondary | ICD-10-CM | POA: Diagnosis not present

## 2021-07-21 DIAGNOSIS — M25569 Pain in unspecified knee: Secondary | ICD-10-CM | POA: Diagnosis not present

## 2021-07-21 DIAGNOSIS — N189 Chronic kidney disease, unspecified: Secondary | ICD-10-CM | POA: Diagnosis not present

## 2021-07-21 DIAGNOSIS — Z79899 Other long term (current) drug therapy: Secondary | ICD-10-CM | POA: Diagnosis not present

## 2021-07-21 DIAGNOSIS — M150 Primary generalized (osteo)arthritis: Secondary | ICD-10-CM | POA: Diagnosis not present

## 2021-07-21 DIAGNOSIS — M1A09X1 Idiopathic chronic gout, multiple sites, with tophus (tophi): Secondary | ICD-10-CM | POA: Diagnosis not present

## 2021-07-21 DIAGNOSIS — Z8673 Personal history of transient ischemic attack (TIA), and cerebral infarction without residual deficits: Secondary | ICD-10-CM | POA: Diagnosis not present

## 2021-07-27 DIAGNOSIS — E559 Vitamin D deficiency, unspecified: Secondary | ICD-10-CM | POA: Diagnosis not present

## 2021-07-27 DIAGNOSIS — I5042 Chronic combined systolic (congestive) and diastolic (congestive) heart failure: Secondary | ICD-10-CM | POA: Diagnosis not present

## 2021-07-27 DIAGNOSIS — I129 Hypertensive chronic kidney disease with stage 1 through stage 4 chronic kidney disease, or unspecified chronic kidney disease: Secondary | ICD-10-CM | POA: Diagnosis not present

## 2021-07-27 DIAGNOSIS — N183 Chronic kidney disease, stage 3 unspecified: Secondary | ICD-10-CM | POA: Diagnosis not present

## 2021-08-11 DIAGNOSIS — I5042 Chronic combined systolic (congestive) and diastolic (congestive) heart failure: Secondary | ICD-10-CM | POA: Diagnosis not present

## 2021-08-11 DIAGNOSIS — N184 Chronic kidney disease, stage 4 (severe): Secondary | ICD-10-CM | POA: Diagnosis not present

## 2021-08-11 DIAGNOSIS — N390 Urinary tract infection, site not specified: Secondary | ICD-10-CM | POA: Diagnosis not present

## 2021-08-11 DIAGNOSIS — F1090 Alcohol use, unspecified, uncomplicated: Secondary | ICD-10-CM | POA: Diagnosis not present

## 2021-08-11 DIAGNOSIS — M1A00X0 Idiopathic chronic gout, unspecified site, without tophus (tophi): Secondary | ICD-10-CM | POA: Diagnosis not present

## 2021-08-11 DIAGNOSIS — I129 Hypertensive chronic kidney disease with stage 1 through stage 4 chronic kidney disease, or unspecified chronic kidney disease: Secondary | ICD-10-CM | POA: Diagnosis not present

## 2021-08-19 DIAGNOSIS — L509 Urticaria, unspecified: Secondary | ICD-10-CM | POA: Diagnosis not present

## 2021-08-29 NOTE — Progress Notes (Signed)
Chart reviewed, agree above plan ?

## 2021-09-23 ENCOUNTER — Encounter (HOSPITAL_COMMUNITY): Payer: Self-pay | Admitting: *Deleted

## 2021-09-23 ENCOUNTER — Emergency Department (HOSPITAL_COMMUNITY): Payer: Medicare Other

## 2021-09-23 ENCOUNTER — Inpatient Hospital Stay (HOSPITAL_COMMUNITY)
Admission: EM | Admit: 2021-09-23 | Discharge: 2021-09-27 | DRG: 666 | Disposition: A | Discharge status: Home / Self Care | Payer: Medicare Other | Attending: Internal Medicine | Admitting: Internal Medicine

## 2021-09-23 DIAGNOSIS — N289 Disorder of kidney and ureter, unspecified: Secondary | ICD-10-CM | POA: Diagnosis not present

## 2021-09-23 DIAGNOSIS — M109 Gout, unspecified: Secondary | ICD-10-CM | POA: Diagnosis not present

## 2021-09-23 DIAGNOSIS — N3001 Acute cystitis with hematuria: Secondary | ICD-10-CM | POA: Diagnosis not present

## 2021-09-23 DIAGNOSIS — E785 Hyperlipidemia, unspecified: Secondary | ICD-10-CM | POA: Diagnosis present

## 2021-09-23 DIAGNOSIS — N390 Urinary tract infection, site not specified: Principal | ICD-10-CM | POA: Diagnosis present

## 2021-09-23 DIAGNOSIS — Z832 Family history of diseases of the blood and blood-forming organs and certain disorders involving the immune mechanism: Secondary | ICD-10-CM | POA: Diagnosis not present

## 2021-09-23 DIAGNOSIS — Z888 Allergy status to other drugs, medicaments and biological substances status: Secondary | ICD-10-CM

## 2021-09-23 DIAGNOSIS — E860 Dehydration: Secondary | ICD-10-CM | POA: Diagnosis present

## 2021-09-23 DIAGNOSIS — I130 Hypertensive heart and chronic kidney disease with heart failure and stage 1 through stage 4 chronic kidney disease, or unspecified chronic kidney disease: Secondary | ICD-10-CM | POA: Diagnosis not present

## 2021-09-23 DIAGNOSIS — G40909 Epilepsy, unspecified, not intractable, without status epilepticus: Secondary | ICD-10-CM

## 2021-09-23 DIAGNOSIS — R319 Hematuria, unspecified: Secondary | ICD-10-CM | POA: Diagnosis not present

## 2021-09-23 DIAGNOSIS — N412 Abscess of prostate: Secondary | ICD-10-CM

## 2021-09-23 DIAGNOSIS — R531 Weakness: Secondary | ICD-10-CM | POA: Diagnosis not present

## 2021-09-23 DIAGNOSIS — N183 Chronic kidney disease, stage 3 unspecified: Secondary | ICD-10-CM

## 2021-09-23 DIAGNOSIS — Z7901 Long term (current) use of anticoagulants: Secondary | ICD-10-CM | POA: Diagnosis not present

## 2021-09-23 DIAGNOSIS — N400 Enlarged prostate without lower urinary tract symptoms: Secondary | ICD-10-CM | POA: Diagnosis not present

## 2021-09-23 DIAGNOSIS — Z8249 Family history of ischemic heart disease and other diseases of the circulatory system: Secondary | ICD-10-CM | POA: Diagnosis not present

## 2021-09-23 DIAGNOSIS — Z79899 Other long term (current) drug therapy: Secondary | ICD-10-CM | POA: Diagnosis not present

## 2021-09-23 DIAGNOSIS — I69354 Hemiplegia and hemiparesis following cerebral infarction affecting left non-dominant side: Secondary | ICD-10-CM

## 2021-09-23 DIAGNOSIS — N179 Acute kidney failure, unspecified: Secondary | ICD-10-CM | POA: Diagnosis present

## 2021-09-23 DIAGNOSIS — E872 Acidosis, unspecified: Secondary | ICD-10-CM | POA: Diagnosis not present

## 2021-09-23 DIAGNOSIS — B9620 Unspecified Escherichia coli [E. coli] as the cause of diseases classified elsewhere: Secondary | ICD-10-CM | POA: Diagnosis not present

## 2021-09-23 DIAGNOSIS — K449 Diaphragmatic hernia without obstruction or gangrene: Secondary | ICD-10-CM | POA: Diagnosis not present

## 2021-09-23 DIAGNOSIS — J9601 Acute respiratory failure with hypoxia: Secondary | ICD-10-CM | POA: Diagnosis not present

## 2021-09-23 DIAGNOSIS — Z8673 Personal history of transient ischemic attack (TIA), and cerebral infarction without residual deficits: Secondary | ICD-10-CM

## 2021-09-23 DIAGNOSIS — I5042 Chronic combined systolic (congestive) and diastolic (congestive) heart failure: Secondary | ICD-10-CM | POA: Diagnosis not present

## 2021-09-23 DIAGNOSIS — Z87440 Personal history of urinary (tract) infections: Secondary | ICD-10-CM | POA: Diagnosis not present

## 2021-09-23 DIAGNOSIS — Z743 Need for continuous supervision: Secondary | ICD-10-CM | POA: Diagnosis not present

## 2021-09-23 DIAGNOSIS — N410 Acute prostatitis: Secondary | ICD-10-CM | POA: Diagnosis not present

## 2021-09-23 DIAGNOSIS — Z86718 Personal history of other venous thrombosis and embolism: Secondary | ICD-10-CM

## 2021-09-23 DIAGNOSIS — I10 Essential (primary) hypertension: Secondary | ICD-10-CM | POA: Diagnosis not present

## 2021-09-23 DIAGNOSIS — N1832 Chronic kidney disease, stage 3b: Secondary | ICD-10-CM | POA: Diagnosis present

## 2021-09-23 DIAGNOSIS — Z20822 Contact with and (suspected) exposure to covid-19: Secondary | ICD-10-CM | POA: Diagnosis not present

## 2021-09-23 DIAGNOSIS — N411 Chronic prostatitis: Secondary | ICD-10-CM | POA: Diagnosis not present

## 2021-09-23 DIAGNOSIS — R102 Pelvic and perineal pain: Secondary | ICD-10-CM | POA: Diagnosis not present

## 2021-09-23 LAB — CBC
HCT: 37.6 % — ABNORMAL LOW (ref 39.0–52.0)
Hemoglobin: 11.7 g/dL — ABNORMAL LOW (ref 13.0–17.0)
MCH: 27.3 pg (ref 26.0–34.0)
MCHC: 31.1 g/dL (ref 30.0–36.0)
MCV: 87.9 fL (ref 80.0–100.0)
Platelets: 268 10*3/uL (ref 150–400)
RBC: 4.28 MIL/uL (ref 4.22–5.81)
RDW: 14.7 % (ref 11.5–15.5)
WBC: 9 10*3/uL (ref 4.0–10.5)
nRBC: 0 % (ref 0.0–0.2)

## 2021-09-23 LAB — URINALYSIS, ROUTINE W REFLEX MICROSCOPIC
Bilirubin Urine: NEGATIVE
Glucose, UA: NEGATIVE mg/dL
Ketones, ur: NEGATIVE mg/dL
Nitrite: POSITIVE — AB
Protein, ur: >=300 mg/dL — AB
Specific Gravity, Urine: 1.015 (ref 1.005–1.030)
WBC, UA: >50 WBC/hpf — ABNORMAL HIGH (ref 0–5)
pH: 5 (ref 5.0–8.0)

## 2021-09-23 LAB — COMPREHENSIVE METABOLIC PANEL
ALT: 18 U/L (ref 0–44)
AST: 21 U/L (ref 15–41)
Albumin: 3.2 g/dL — ABNORMAL LOW (ref 3.5–5.0)
Alkaline Phosphatase: 71 U/L (ref 38–126)
Anion gap: 9 (ref 5–15)
BUN: 33 mg/dL — ABNORMAL HIGH (ref 8–23)
CO2: 19 mmol/L — ABNORMAL LOW (ref 22–32)
Calcium: 9.1 mg/dL (ref 8.9–10.3)
Chloride: 112 mmol/L — ABNORMAL HIGH (ref 98–111)
Creatinine, Ser: 2.1 mg/dL — ABNORMAL HIGH (ref 0.61–1.24)
GFR, Estimated: 33 mL/min — ABNORMAL LOW (ref >60)
Glucose, Bld: 95 mg/dL (ref 70–99)
Potassium: 4.7 mmol/L (ref 3.5–5.1)
Sodium: 140 mmol/L (ref 135–145)
Total Bilirubin: 0.5 mg/dL (ref 0.3–1.2)
Total Protein: 7.9 g/dL (ref 6.5–8.1)

## 2021-09-23 LAB — LIPASE, BLOOD: Lipase: 42 U/L (ref 11–51)

## 2021-09-23 LAB — LACTIC ACID, PLASMA: Lactic Acid, Venous: 0.8 mmol/L (ref 0.5–1.9)

## 2021-09-23 MED ORDER — ACETAMINOPHEN 325 MG PO TABS
650.0000 mg | ORAL_TABLET | Freq: Once | ORAL | Status: AC
  Administered 2021-09-23: 21:00:00 650 mg via ORAL
  Filled 2021-09-23: qty 2

## 2021-09-23 MED ORDER — AMLODIPINE BESYLATE 10 MG PO TABS
10.0000 mg | ORAL_TABLET | Freq: Every day | ORAL | Status: DC
  Administered 2021-09-24 – 2021-09-27 (×4): 10 mg via ORAL
  Filled 2021-09-23 (×4): qty 1

## 2021-09-23 MED ORDER — PANTOPRAZOLE SODIUM 40 MG PO TBEC
40.0000 mg | DELAYED_RELEASE_TABLET | Freq: Every day | ORAL | Status: DC
  Administered 2021-09-24 – 2021-09-27 (×4): 40 mg via ORAL
  Filled 2021-09-23 (×4): qty 1

## 2021-09-23 MED ORDER — CEPHALEXIN 250 MG PO CAPS
500.0000 mg | ORAL_CAPSULE | Freq: Once | ORAL | Status: AC
  Administered 2021-09-23: 21:00:00 500 mg via ORAL
  Filled 2021-09-23: qty 2

## 2021-09-23 MED ORDER — LEVETIRACETAM ER 500 MG PO TB24
1500.0000 mg | ORAL_TABLET | Freq: Every day | ORAL | Status: DC
  Administered 2021-09-23 – 2021-09-26 (×4): 1500 mg via ORAL
  Filled 2021-09-23 (×5): qty 3

## 2021-09-23 MED ORDER — SENNOSIDES-DOCUSATE SODIUM 8.6-50 MG PO TABS
1.0000 | ORAL_TABLET | Freq: Every evening | ORAL | Status: DC | PRN
  Administered 2021-09-25: 09:00:00 1 via ORAL
  Filled 2021-09-23: qty 1

## 2021-09-23 MED ORDER — FUROSEMIDE 20 MG PO TABS
20.0000 mg | ORAL_TABLET | Freq: Every day | ORAL | Status: DC
  Administered 2021-09-24: 08:00:00 20 mg via ORAL
  Filled 2021-09-23: qty 1

## 2021-09-23 MED ORDER — CARVEDILOL 25 MG PO TABS
25.0000 mg | ORAL_TABLET | Freq: Two times a day (BID) | ORAL | Status: DC
  Administered 2021-09-24 – 2021-09-27 (×7): 25 mg via ORAL
  Filled 2021-09-23 (×7): qty 1

## 2021-09-23 MED ORDER — ACETAMINOPHEN 325 MG PO TABS
650.0000 mg | ORAL_TABLET | Freq: Four times a day (QID) | ORAL | Status: DC | PRN
  Administered 2021-09-24 – 2021-09-27 (×4): 650 mg via ORAL
  Filled 2021-09-23 (×5): qty 2

## 2021-09-23 MED ORDER — PIPERACILLIN-TAZOBACTAM 3.375 G IVPB 30 MIN
3.3750 g | Freq: Once | INTRAVENOUS | Status: AC
  Administered 2021-09-24: 3.375 g via INTRAVENOUS
  Filled 2021-09-23: qty 50

## 2021-09-23 MED ORDER — PIPERACILLIN-TAZOBACTAM 3.375 G IVPB
3.3750 g | Freq: Three times a day (TID) | INTRAVENOUS | Status: DC
  Administered 2021-09-24 – 2021-09-25 (×5): 3.375 g via INTRAVENOUS
  Filled 2021-09-23 (×6): qty 50

## 2021-09-23 MED ORDER — ALLOPURINOL 100 MG PO TABS
100.0000 mg | ORAL_TABLET | Freq: Every day | ORAL | Status: DC
  Administered 2021-09-24 – 2021-09-27 (×4): 100 mg via ORAL
  Filled 2021-09-23 (×4): qty 1

## 2021-09-23 MED ORDER — OXYCODONE HCL 5 MG PO TABS
5.0000 mg | ORAL_TABLET | Freq: Four times a day (QID) | ORAL | Status: DC | PRN
  Administered 2021-09-25 – 2021-09-27 (×6): 5 mg via ORAL
  Filled 2021-09-23 (×6): qty 1

## 2021-09-23 MED ORDER — ACETAMINOPHEN 650 MG RE SUPP: 650.0000 mg | Freq: Four times a day (QID) | RECTAL | Status: DC | PRN

## 2021-09-23 NOTE — H&P (Signed)
History and Physical    Mitchell Freilich Sr. ZOX:[id number] DOB: 11/12/1950 DOA: 09/23/2021  PCP: [name], MD   Patient coming from: Home   Chief Complaint: Dysuria, hematuria   HPI: Mitchell Comber Sr. is a pleasant 70 y.o. male with medical history significant for DVT on Xarelto, CVA with residual left-sided motor deficits, chronic combined systolic and diastolic CHF, CKD stage III, and seizures, now presenting to the emergency department for evaluation of dysuria, suprapubic pain, and gross hematuria.  Patient reports that he completed a course of antibiotics (he believes it was cephalexin) in November, had resolution of urinary symptoms then, but then developed recurrent dysuria in addition to the suprapubic pain 2 days ago.  He also noted a small amount of blood in his briefs.  He denies subjective fevers or flank pain.  ED Course: Upon arrival to the ED, patient is found to be afebrile, saturating well on room air, and with stable blood pressure.  Chemistry panel notable for creatinine 2.10.  CBC with mild normocytic anemia.  Lactic acid normal.  Urinalysis is nitrite positive with many bacteria and >50 WBC/hpf.  CT of the abdomen and pelvis demonstrates heterogenous enlarged prostate with suspected prostatic abscess, marked perivesicular fat stranding, and bilateral indeterminate renal hypodensities.  Urology was consulted by the ED physician, blood cultures and urine cultures were ordered, Zosyn was started, and Tylenol given.  Review of Systems:  All other systems reviewed and apart from HPI, are negative.  Past Medical History:  Diagnosis Date   Alcohol abuse    Anemia    Anoxic brain injury ([place])    Chronic pulmonary edema    CVA (cerebral vascular accident) ([place])    Dysphagia    Gout    Hemiplegia affecting left dominant side (HCC)    History of stroke    Hyperlipemia    Hypertension    Seizures ([place])    Stroke ([place])    Thrombocytopenia ([place])     Past Surgical History:   Procedure Laterality Date   lymph nodes     biopsy - benign   RADIOLOGY WITH ANESTHESIA N/A 07/17/2015   Procedure: RADIOLOGY WITH ANESTHESIA;  Surgeon: Medication Radiologist, MD;  Location: [facility];  Service: Radiology;  Laterality: N/A;    Social History:   reports that he has never smoked. He has never used smokeless tobacco. He reports that he does not drink alcohol and does not use drugs.  Allergies  Allergen Reactions   Hydrochlorothiazide     REACTION: precipitates gout   Pseudoephedrine     REACTION: rash    Family History  Problem Relation Age of Onset   Aneurysm Sister    Clotting disorder Brother    Aneurysm Mother    Heart attack Father      Prior to Admission medications   Medication Sig Start Date End Date Taking? Authorizing Provider  acetaminophen (TYLENOL) 325 MG tablet Take 2 tablets (650 mg total) by mouth every 6 (six) hours as needed for mild pain, moderate pain or fever (or Fever >/= 101). 03/09/17  Yes Hongalgi, Lenis Dickinson, MD  allopurinol (ZYLOPRIM) 100 MG tablet Take 100 mg by mouth daily.   Yes [provider]  amLODipine (NORVASC) 10 MG tablet Take 1 tablet (10 mg total) by mouth daily. 08/01/18  Yes Medina-Vargas, Monina C, NP  calcium-vitamin D (OSCAL WITH D) 500-200 MG-UNIT tablet Take 1 tablet by mouth 2 (two) times daily. 08/01/18  Yes Medina-Vargas, Monina C, NP  carvedilol (COREG) 25  MG tablet Take 1 tablet (25 mg total) by mouth 2 (two) times daily with a meal. 08/01/18  Yes Medina-Vargas, Monina C, NP  ferrous sulfate 325 (65 FE) MG tablet Take 1 tablet (325 mg total) by mouth daily with breakfast. 08/01/18  Yes Medina-Vargas, Monina C, NP  folic acid (FOLVITE) 1 MG tablet Take 1 tablet (1 mg total) by mouth daily. 08/01/18  Yes Medina-Vargas, Monina C, NP  furosemide (LASIX) 20 MG tablet Take 1 tablet (20 mg total) by mouth daily. 08/01/18  Yes Medina-Vargas, Monina C, NP  Levetiracetam (KEPPRA XR) 750 MG TB24 Take 2 tablets (1,500 mg total)  by mouth at bedtime. 03/23/21  Yes Suzzanne Cloud, NP  magnesium oxide (MAG-OX) 400 MG tablet Take 1 tablet (400 mg total) by mouth daily. 08/01/18  Yes Medina-Vargas, Monina C, NP  multivitamin (PROSIGHT) TABS tablet Take 1 tablet by mouth daily. 07/19/15  Yes Allie Bossier, MD  pantoprazole (PROTONIX) 40 MG tablet Take 1 tablet (40 mg total) by mouth daily. 08/01/18  Yes Medina-Vargas, Monina C, NP  rivaroxaban (XARELTO) 20 MG TABS tablet Take 1 tablet (20 mg total) by mouth daily with supper. 08/01/18  Yes Medina-Vargas, Monina C, NP  thiamine 100 MG tablet Take 1 tablet (100 mg total) by mouth daily. 08/01/18  Yes Medina-Vargas, Monina C, NP  traMADol (ULTRAM) 50 MG tablet Take 1 tablet (50 mg total) by mouth every 6 (six) hours as needed. Patient taking differently: Take 50 mg by mouth every 6 (six) hours as needed for moderate pain. 07/02/21  Yes Lajean Saver, MD    Physical Exam: Vitals:   09/23/21 1118 09/23/21 1526 09/23/21 1740 09/23/21 2136  BP: (!) 158/83 (!) 160/91 (!) 165/76 (!) 172/91  Pulse: 70 70 61 78  Resp: 16 18 16 18   Temp: 98.2 F (36.8 C)     TempSrc: Oral     SpO2: 100% 97% 97% 99%    Constitutional: NAD, calm  Eyes: PERTLA, lids and conjunctivae normal ENMT: Mucous membranes are moist. Posterior pharynx clear of any exudate or lesions.   Neck: supple, no masses  Respiratory: no wheezing, no crackles. No accessory muscle use.  Cardiovascular: S1 & S2 heard, regular rate and rhythm. No extremity edema.   Abdomen: No distension, no tenderness, soft. Bowel sounds active.  Musculoskeletal: no clubbing / cyanosis. No joint deformity upper and lower extremities.   Skin: no significant rashes, lesions, ulcers. Warm, dry, well-perfused. Neurologic: CN 2-12 grossly intact. Left-sided weakness. Alert and oriented.  Psychiatric: Pleasant. Cooperative.    Labs and Imaging on Admission: I have personally reviewed following labs and imaging studies  CBC: Recent Labs  Lab  09/23/21 1740  WBC 9.0  HGB 11.7*  HCT 37.6*  MCV 87.9  PLT 202   Basic Metabolic Panel: Recent Labs  Lab 09/23/21 1740  NA 140  K 4.7  CL 112*  CO2 19*  GLUCOSE 95  BUN 33*  CREATININE 2.10*  CALCIUM 9.1   GFR: CrCl cannot be calculated (Unknown ideal weight.). Liver Function Tests: Recent Labs  Lab 09/23/21 1740  AST 21  ALT 18  ALKPHOS 71  BILITOT 0.5  PROT 7.9  ALBUMIN 3.2*   Recent Labs  Lab 09/23/21 1740  LIPASE 42   No results for input(s): AMMONIA in the last 168 hours. Coagulation Profile: No results for input(s): INR, PROTIME in the last 168 hours. Cardiac Enzymes: No results for input(s): CKTOTAL, CKMB, CKMBINDEX, TROPONINI in the last 168 hours. BNP (  last 3 results) No results for input(s): PROBNP in the last 8760 hours. HbA1C: No results for input(s): HGBA1C in the last 72 hours. CBG: No results for input(s): GLUCAP in the last 168 hours. Lipid Profile: No results for input(s): CHOL, HDL, LDLCALC, TRIG, CHOLHDL, LDLDIRECT in the last 72 hours. Thyroid Function Tests: No results for input(s): TSH, T4TOTAL, FREET4, T3FREE, THYROIDAB in the last 72 hours. Anemia Panel: No results for input(s): VITAMINB12, FOLATE, FERRITIN, TIBC, IRON, RETICCTPCT in the last 72 hours. Urine analysis:    Component Value Date/Time   COLORURINE YELLOW 09/23/2021 0811   APPEARANCEUR HAZY (A) 09/23/2021 0811   LABSPEC 1.015 09/23/2021 0811   PHURINE 5.0 09/23/2021 0811   GLUCOSEU NEGATIVE 09/23/2021 0811   HGBUR SMALL (A) 09/23/2021 0811   BILIRUBINUR NEGATIVE 09/23/2021 0811   KETONESUR NEGATIVE 09/23/2021 0811   PROTEINUR >=300 (A) 09/23/2021 0811   UROBILINOGEN 1.0 07/19/2015 1739   NITRITE POSITIVE (A) 09/23/2021 0811   LEUKOCYTESUR SMALL (A) 09/23/2021 0811   Sepsis Labs: @LABRCNTIP (procalcitonin:4,lacticidven:4) )No results found for this or any previous visit (from the past 240 hour(s)).   Radiological Exams on Admission: CT Renal Stone  Study  Result Date: 09/23/2021 CLINICAL DATA:  Hematuria, suprapubic pain, urinary tract infection EXAM: CT ABDOMEN AND PELVIS WITHOUT CONTRAST TECHNIQUE: Multidetector CT imaging of the abdomen and pelvis was performed following the standard protocol without IV contrast. Unenhanced CT was performed per clinician order. Lack of IV contrast limits sensitivity and specificity, especially for evaluation of abdominal/pelvic solid viscera. COMPARISON:  03/08/2017, 05/08/2021 FINDINGS: Lower chest: No acute pleural or parenchymal lung disease. Small hiatal hernia. Hepatobiliary: Unremarkable unenhanced appearance of the liver and gallbladder. Pancreas: Unremarkable unenhanced appearance. Spleen: Unremarkable unenhanced appearance. Adrenals/Urinary Tract: Malrotation of the bilateral kidneys. There are multiple bilateral simple and complex renal cysts. Complex 4.5 cm cyst upper pole right kidney measures 39 Hounsfield units, similar to prior ultrasound exam. 1.2 cm indeterminate lesion within the posterior aspect lower pole left kidney has Hounsfield attenuation of 28. No urinary tract calculi or obstructive uropathy within either kidney. Adrenals are unremarkable. The bladder is decompressed, which limits evaluation. Marked perivesicular fat stranding concerning for cystitis. Stomach/Bowel: No bowel obstruction or ileus. Normal appendix right lower quadrant. No bowel wall thickening. Vascular/Lymphatic: Moderate atherosclerosis of the aorta. No pathologic adenopathy within the abdomen or pelvis. Reproductive: The prostate is enlarged measuring 5.2 x 6.2 by 6.3 cm. A 3.4 x 3.8 x 2.7 cm hypodense region within the superior right central aspect of the prostate is concerning for underlying prostatic abscess given clinical history of recurrent urinary tract infection. Evaluation of the prostate parenchyma is limited without IV contrast. The seminal vesicles appear unremarkable. Other: No free fluid or free intraperitoneal  gas. There is a small fat containing umbilical hernia as well as a fat containing right inguinal hernia. No bowel herniation. Musculoskeletal: No acute or destructive bony lesions. Reconstructed images demonstrate no additional findings. IMPRESSION: 1. Heterogeneous enlarged prostate, with evidence of suspected prostatic abscess. 2. Marked perivesicular fat stranding concerning for cystitis. Bladder is decompressed, which limits evaluation. 3. Bilateral indeterminate renal hypodensities, the largest of which corresponded to a complex cyst on prior ultrasound. Definitive characterization with nonemergent outpatient multiphase renal CT or MRI is recommended. 4. Small hiatal hernia, fat containing umbilical hernia, and fat containing right inguinal hernia as above. 5.  Aortic Atherosclerosis (ICD10-I70.0). Electronically Signed   By: Randa Ngo M.D.   On: 09/23/2021 20:08     Assessment/Plan   1. UTI,  prostatic abscess  - Presents with suprapubic pain, hematuria, and dysuria and found to have UTI with prostatic abscess  - Not septic on admission  - Urology consulted by ED and much appreciated, recommending admission to [facility] for likely drainage  - Culture urine and blood, continue empiric antibiotic, hold Xarelto pending urology consultation (last dose was PM of 09/22/21)    2. Chronic combined systolic & diastolic CHF  - EF was 96-04% in 2019  - Appears compensated  - Continue Lasix and Coreg, monitor volume status   3. CKD III  - SCr is 2.10 on admission, up from 1.6 in 2019  - Renally-dose medications, monitor    4. Hypertension  - Continue Norvasc, Coreg    5. Hx of CVA  - Has persistent left-sided motor deficits, uses cane intermittently at baseline    6. Seizures  - No seizures since 2019 - Continue Keppra    7. Hx of DVT  - Last dose of Xarelto was evening of 09/22/21, hold pending urology consultation    8. Kidney lesions  - Indeterminate hypodensities noted bilaterally on CT   - Outpatient imaging recommneded     DVT prophylaxis: Xarelto pta, SCDs for now  Code Status: Full  Level of Care: Level of care: Med-Surg Family Communication: wife and daughter at bedside  Disposition Plan:  Patient is from: Home  Anticipated d/c is to: Home  Anticipated d/c date is: 12/31 or 09/27/21  Patient currently: Pending urology consultation and likely abscess drainage  Consults called: urology  Admission status: Inpatient     Vianne Bulls, MD [facility]  09/23/2021, 10:48 PM

## 2021-09-23 NOTE — Discharge Instructions (Addendum)
Please take antibiotics as prescribed.  Please take Tylenol for pain.  Drink plenty of water.  Please follow-up with [facility].

## 2021-09-23 NOTE — Progress Notes (Signed)
Pharmacy Antibiotic Note  Mitchell [name]. is a 70 y.o. male admitted on 09/23/2021 with  prostate abscess .  Pharmacy has been consulted for zosyn dosing.  SCr 2.1 - above baseline WBC 9; LA 0.8; T 98.6 F  Plan: Zosyn 3.375g IV q8h (4 hour infusion) Trend WBC, Fever, Renal function, & Clinical course F/u cultures, clinical course, WBC, fever De-escalate when able     Temp (24hrs), Avg:98.4 F (36.9 C), Min:98.2 F (36.8 C), Max:98.6 F (37 C)  Recent Labs  Lab 09/23/21 1740  WBC 9.0  CREATININE 2.10*  LATICACIDVEN 0.8    CrCl cannot be calculated (Unknown ideal weight.).    Allergies  Allergen Reactions   Hydrochlorothiazide     REACTION: precipitates gout   Pseudoephedrine     REACTION: rash    Antimicrobials this admission: zosyn 12/28 >>   Microbiology results: Pending  Thank you for allowing pharmacy to be a part of this patients care.  Lorelei Pont, PharmD, BCPS 09/23/2021 10:16 PM ED Clinical Pharmacist -  [phone]

## 2021-09-23 NOTE — ED Provider Notes (Signed)
70 yo male with hematuria x4 days, dysuria. Hx recent UTI around 1 month ago was treated with abx and symptoms improved initially, returned 4 days ago. See PA note for full details. CBC stable, afebrile. UA with infection, culture sent. CT with apparent prostate abscess, D/w Dr [name] for urology recommends start zosyn, cultures, txfr to [facility] for aspiration/surgical eval. Pt is on DOAC, recommend he be off his DOAC x48 hours prior to surgery. Keep on abx until then.   D/w pt and spouse who are agreeable.   D/w Dr Myna Hidalgo who accepts pt for admission; will txfr to [facility] to facilitate urology intervention.    .Critical Care Performed by: Jeanell Sparrow, DO Authorized by: Jeanell Sparrow, DO   Critical care provider statement:    Critical care time (minutes):  30   Critical care time was exclusive of:  Separately billable procedures and treating other patients   Critical care was time spent personally by me on the following activities:  Development of treatment plan with patient or surrogate, discussions with consultants, evaluation of patient's response to treatment, examination of patient, ordering and review of laboratory studies, ordering and review of radiographic studies, ordering and performing treatments and interventions, pulse oximetry, re-evaluation of patient's condition and review of old charts   Care discussed with: admitting provider   Comments:     Intraabdominal abscess requiring broad spectrum abx and urgent urology evaluation     Jeanell Sparrow, DO 09/23/21 2345

## 2021-09-23 NOTE — ED Triage Notes (Addendum)
To ED for eval of hematuria and pain with urination for past. Was to see pcp at [facility] today but in too much pain to wait. No fever. Abd pain and back pain.

## 2021-09-23 NOTE — ED Provider Notes (Signed)
[facility] EMERGENCY DEPARTMENT Provider Note   CSN: [id number] Arrival date & time: 09/23/21  0744     History Chief Complaint  Patient presents with   Hematuria    Mitchell Oberhaus Sr. is a 70 y.o. male.   Hematuria Pertinent negatives include no chest pain, no abdominal pain, no headaches and no shortness of breath. 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  Came to the emergency room for evaluation of this.  Had a urinary tract infection over a month ago with complete resolution of symptoms after antibiotics.  Denies any fevers or chills nausea vomiting states he has not had any diarrhea but does state some discomfort with pooping.  No cough congestion lightheadedness dizziness.  No other associate symptoms.  He describes his suprapubic pain is achy and worse when urinating.  States pain is mild currently.  No other associate symptoms.    Past Medical History:  Diagnosis Date   Alcohol abuse    Anemia    Anoxic brain injury ([place])    Chronic pulmonary edema    CVA (cerebral vascular accident) ([place])    Dysphagia    Gout    Hemiplegia affecting left dominant side ([place])    History of stroke    Hyperlipemia    Hypertension    Seizures ([place])    Stroke ([place])    Thrombocytopenia ([place])     Patient Active Problem List   Diagnosis Date Noted   Sleep apnea 03/23/2021   Excessive sleepiness 09/23/2020   Seizures ([employer]) 06/29/2018   Hemiparesis of left nondominant side as late effect of cerebral infarction ([place]) 06/29/2018   Normochromic normocytic anemia 06/22/2018   Accelerated hypertension 06/20/2018   Seizure disorder ([place]) 06/15/2018   History of substance abuse ([place]) 06/15/2018   Myoclonus 06/15/2018   Abnormal EKG  06/15/2018   CVA (cerebral vascular accident) ([place]) 06/07/2018   Acute pancreatitis 03/08/2017   Chronic combined systolic and diastolic CHF (congestive heart failure) ([place]) 07/28/2015   PEG (percutaneous endoscopic gastrostomy) status ([place]) 07/28/2015   History of stroke 07/28/2015   Hypomagnesemia    Primary gout    Mental status change    Dysphagia    Acute respiratory failure with hypoxemia ([place])    Pulmonary edema    Tracheal perforation    CAP (community acquired pneumonia)    Chronic systolic CHF (congestive heart failure) ([place])    Acute renal failure ([place])    Alcohol abuse    Anoxic brain injury ([place])    Thrombocytopenia ([place])    History of ETT    Cardiac arrest ([place]) 07/08/2015   URINARY TRACT INFECTION, RECURRENT 01/04/2007   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 01/04/2007   Hyperlipidemia 11/24/2006   Gout, unspecified 11/24/2006   HYPERTENSION, BENIGN SYSTEMIC 11/24/2006   HEMORRHOIDS, NOS 11/24/2006   DIVERTICULOSIS OF COLON 11/24/2006    Past Surgical History:  Procedure Laterality Date   lymph nodes     biopsy - benign   RADIOLOGY WITH ANESTHESIA N/A 07/17/2015   Procedure: RADIOLOGY WITH ANESTHESIA;  Surgeon: Medication Radiologist, MD;  Location: [facility];  Service: Radiology;  Laterality: N/A;       Family History  Problem Relation Age of Onset   Aneurysm Sister    Clotting disorder Brother  Aneurysm Mother    Heart attack Father     Social History   Tobacco Use   Smoking status: Never   Smokeless tobacco: Never  Vaping Use   Vaping Use: Never used  Substance Use Topics   Alcohol use: Never    Comment: History of alcohol abuse   Drug use: Never    Types: Marijuana    Home Medications Prior to Admission medications   Medication Sig Start Date End Date Taking? Authorizing Provider  acetaminophen (TYLENOL) 325 MG tablet Take 2 tablets (650 mg total) by mouth every 6 (six) hours as needed for mild pain, moderate pain or fever (or  Fever >/= 101). 03/09/17  Yes Hongalgi, Lenis Dickinson, MD  allopurinol (ZYLOPRIM) 100 MG tablet Take 100 mg by mouth daily.   Yes [provider]  amLODipine (NORVASC) 10 MG tablet Take 1 tablet (10 mg total) by mouth daily. 08/01/18  Yes Medina-Vargas, Monina C, NP  calcium-vitamin D (OSCAL WITH D) 500-200 MG-UNIT tablet Take 1 tablet by mouth 2 (two) times daily. 08/01/18  Yes Medina-Vargas, Monina C, NP  carvedilol (COREG) 25 MG tablet Take 1 tablet (25 mg total) by mouth 2 (two) times daily with a meal. 08/01/18  Yes Medina-Vargas, Monina C, NP  ferrous sulfate 325 (65 FE) MG tablet Take 1 tablet (325 mg total) by mouth daily with breakfast. 08/01/18  Yes Medina-Vargas, Monina C, NP  folic acid (FOLVITE) 1 MG tablet Take 1 tablet (1 mg total) by mouth daily. 08/01/18  Yes Medina-Vargas, Monina C, NP  furosemide (LASIX) 20 MG tablet Take 1 tablet (20 mg total) by mouth daily. 08/01/18  Yes Medina-Vargas, Monina C, NP  Levetiracetam (KEPPRA XR) 750 MG TB24 Take 2 tablets (1,500 mg total) by mouth at bedtime. 03/23/21  Yes Suzzanne Cloud, NP  magnesium oxide (MAG-OX) 400 MG tablet Take 1 tablet (400 mg total) by mouth daily. 08/01/18  Yes Medina-Vargas, Monina C, NP  multivitamin (PROSIGHT) TABS tablet Take 1 tablet by mouth daily. 07/19/15  Yes Allie Bossier, MD  pantoprazole (PROTONIX) 40 MG tablet Take 1 tablet (40 mg total) by mouth daily. 08/01/18  Yes Medina-Vargas, Monina C, NP  rivaroxaban (XARELTO) 20 MG TABS tablet Take 1 tablet (20 mg total) by mouth daily with supper. 08/01/18  Yes Medina-Vargas, Monina C, NP  thiamine 100 MG tablet Take 1 tablet (100 mg total) by mouth daily. 08/01/18  Yes Medina-Vargas, Monina C, NP  traMADol (ULTRAM) 50 MG tablet Take 1 tablet (50 mg total) by mouth every 6 (six) hours as needed. Patient taking differently: Take 50 mg by mouth every 6 (six) hours as needed for moderate pain. 07/02/21  Yes Lajean Saver, MD    Allergies    Hydrochlorothiazide and  Pseudoephedrine  Review of Systems   Review of Systems  Constitutional:  Negative for chills and fever.  HENT:  Negative for congestion.   Eyes:  Negative for pain.  Respiratory:  Negative for cough and shortness of breath.   Cardiovascular:  Negative for chest pain and leg swelling.  Gastrointestinal:  Negative for abdominal pain and vomiting.  Genitourinary:  Positive for dysuria and hematuria. Negative for penile discharge.  Musculoskeletal:  Negative for myalgias.  Skin:  Negative for rash.  Neurological:  Negative for dizziness and headaches.   Physical Exam Updated Vital Signs BP (!) 165/76    Pulse 61    Temp 98.2 F (36.8 C) (Oral)    Resp 16    SpO2 97%  Physical Exam Vitals and nursing note reviewed.  Constitutional:      General: He is not in acute distress. HENT:     Head: Normocephalic and atraumatic.     Nose: Nose normal.     Mouth/Throat:     Mouth: Mucous membranes are moist.  Eyes:     General: No scleral icterus. Cardiovascular:     Rate and Rhythm: Normal rate and regular rhythm.     Pulses: Normal pulses.     Heart sounds: Normal heart sounds.  Pulmonary:     Effort: Pulmonary effort is normal. No respiratory distress.     Breath sounds: No wheezing.  Abdominal:     Palpations: Abdomen is soft.     Tenderness: There is no abdominal tenderness.     Comments: Abdomen soft nontender.  No guarding or rebound.  Patient gestures at suprapubic region described pain.  Musculoskeletal:     Cervical back: Normal range of motion.     Right lower leg: No edema.     Left lower leg: No edema.     Comments: Left arm contracted-Baseline deficit  Skin:    General: Skin is warm and dry.     Capillary Refill: Capillary refill takes less than 2 seconds.  Neurological:     Mental Status: He is alert. Mental status is at baseline.  Psychiatric:        Mood and Affect: Mood normal.        Behavior: Behavior normal.    ED Results / Procedures / Treatments    Labs (all labs ordered are listed, but only abnormal results are displayed) Labs Reviewed  URINALYSIS, ROUTINE W REFLEX MICROSCOPIC - Abnormal; Notable for the following components:      Result Value   APPearance HAZY (*)    Hgb urine dipstick SMALL (*)    Protein, ur >=300 (*)    Nitrite POSITIVE (*)    Leukocytes,Ua SMALL (*)    WBC, UA >50 (*)    Bacteria, UA MANY (*)    All other components within normal limits  URINE CULTURE  LACTIC ACID, PLASMA  LIPASE, BLOOD  COMPREHENSIVE METABOLIC PANEL  CBC    EKG None  Radiology No results found.  Procedures Procedures   Medications Ordered in ED Medications  cephALEXin (KEFLEX) capsule 500 mg (has no administration in time range)  acetaminophen (TYLENOL) tablet 650 mg (has no administration in time range)    ED Course  I have reviewed the triage vital signs and the nursing notes.  Pertinent labs & imaging results that were available during my care of the patient were reviewed by me and considered in my medical decision making (see chart for details).    MDM Rules/Calculators/A&P                          Patient is a 70 year old male this is a second UTI in the past approximately 1 month.  He is having discomfort with urination and some blood in urine.  Some suprapubic pain but not significantly tender on palpation.  Patient is afebrile not tachycardic.  Overall relatively well-appearing but chronically ill.  Will obtain labs including lactate, urinalysis consistent with infection urine culture sent.  Lab results and CT renal stone study pending at time of shift change.  Will handoff to oncoming provider.  Final Clinical Impression(s) / ED Diagnoses Final diagnoses:  Acute cystitis with hematuria    Rx / DC Orders ED  Discharge Orders     None        Tedd Sias, [place] 09/23/21 1846    Jeanell Sparrow, DO 09/24/21 [phone]

## 2021-09-23 NOTE — ED Notes (Signed)
Pt using bedside commode

## 2021-09-24 ENCOUNTER — Other Ambulatory Visit: Payer: Self-pay

## 2021-09-24 LAB — CBC
HCT: 33.5 % — ABNORMAL LOW (ref 39.0–52.0)
Hemoglobin: 10.8 g/dL — ABNORMAL LOW (ref 13.0–17.0)
MCH: 28 pg (ref 26.0–34.0)
MCHC: 32.2 g/dL (ref 30.0–36.0)
MCV: 86.8 fL (ref 80.0–100.0)
Platelets: 201 10*3/uL (ref 150–400)
RBC: 3.86 MIL/uL — ABNORMAL LOW (ref 4.22–5.81)
RDW: 14.7 % (ref 11.5–15.5)
WBC: 9.4 10*3/uL (ref 4.0–10.5)
nRBC: 0 % (ref 0.0–0.2)

## 2021-09-24 LAB — BASIC METABOLIC PANEL
Anion gap: 8 (ref 5–15)
BUN: 32 mg/dL — ABNORMAL HIGH (ref 8–23)
CO2: 19 mmol/L — ABNORMAL LOW (ref 22–32)
Calcium: 8.7 mg/dL — ABNORMAL LOW (ref 8.9–10.3)
Chloride: 110 mmol/L (ref 98–111)
Creatinine, Ser: 2.12 mg/dL — ABNORMAL HIGH (ref 0.61–1.24)
GFR, Estimated: 33 mL/min — ABNORMAL LOW (ref >60)
Glucose, Bld: 85 mg/dL (ref 70–99)
Potassium: 4.5 mmol/L (ref 3.5–5.1)
Sodium: 137 mmol/L (ref 135–145)

## 2021-09-24 LAB — RESP PANEL BY RT-PCR (FLU A&B, COVID) ARPGX2
Influenza A by PCR: NEGATIVE
Influenza B by PCR: NEGATIVE
SARS Coronavirus 2 by RT PCR: NEGATIVE

## 2021-09-24 MED ORDER — SODIUM CHLORIDE 0.9 % IV SOLN: INTRAVENOUS | Status: DC | PRN

## 2021-09-24 MED ORDER — SODIUM BICARBONATE 650 MG PO TABS
650.0000 mg | ORAL_TABLET | Freq: Two times a day (BID) | ORAL | Status: DC
  Administered 2021-09-24 – 2021-09-27 (×7): 650 mg via ORAL
  Filled 2021-09-24 (×8): qty 1

## 2021-09-24 MED ORDER — HYDRALAZINE HCL 20 MG/ML IJ SOLN: 10.0000 mg | Freq: Four times a day (QID) | INTRAMUSCULAR | Status: DC | PRN

## 2021-09-24 MED ORDER — SODIUM CHLORIDE 0.9 % IV SOLN: INTRAVENOUS | Status: DC

## 2021-09-24 MED ORDER — ENSURE ENLIVE PO LIQD
237.0000 mL | Freq: Two times a day (BID) | ORAL | Status: DC
  Administered 2021-09-24 (×2): 237 mL via ORAL

## 2021-09-24 NOTE — Plan of Care (Signed)
Plan of care discussed.   

## 2021-09-24 NOTE — ED Notes (Signed)
[facility] called for pt transport  

## 2021-09-24 NOTE — Progress Notes (Signed)
Transition of Care [facility]) Screening Note  Patient Details  Name: Mitchell Brisby Sr. Date of Birth: 14-Nov-1950  [facility]) CM/SW Contact:    Sherie Don, LCSW Phone Number: 09/24/2021, 9:44 AM  Transition of Care Department [facility]) has reviewed patient and no TOC needs have been identified at this time. We will continue to monitor patient advancement through interdisciplinary progression rounds. If new patient transition needs arise, please place a TOC consult.

## 2021-09-24 NOTE — Plan of Care (Signed)
  Problem: Pain Managment: Goal: General experience of comfort will improve Outcome: Progressing   Problem: Safety: Goal: Ability to remain free from injury will improve Outcome: Progressing   

## 2021-09-24 NOTE — Consult Note (Signed)
Urology Consult   Physician requesting consult: Dr. Tawanna Solo  Reason for consult: Prostatic abscess  History of Present Illness: [name]. is a 70 y.o. who was treated for a UTI about a month ago.  He was doing better until about 3 days ago when he developed progressive urinary symptoms including dysuria, hesitancy, hematuria.  He has not had fever or pain.  He had a CT scan performed in the ED upon evaluation.  This was performed without contrast due to CKD (baseline Cr 2.0) and demonstrated a hypodense area on the right side of the prostate mostly measuring 3.8 cm consistent with a prostatic abscess.  He has no leukocytosis.  He is on Xarelto due to his history of stroke.  He has multiple comorbid conditions including a history of alcohol abuse, CVA, gout, hyperlipidemia, hypertension, seizure disorder, and thrombocytopenia.  He denies a history of voiding or storage urinary symptoms, hematuria, UTIs, STDs, urolithiasis, GU malignancy/trauma/surgery.  Past Medical History:  Diagnosis Date   Alcohol abuse    Anemia    Anoxic brain injury ([place])    Chronic pulmonary edema    CVA (cerebral vascular accident) ([place])    Dysphagia    Gout    Hemiplegia affecting left dominant side (HCC)    History of stroke    Hyperlipemia    Hypertension    Seizures ([place])    Stroke ([place])    Thrombocytopenia ([place])     Past Surgical History:  Procedure Laterality Date   lymph nodes     biopsy - benign   RADIOLOGY WITH ANESTHESIA N/A 07/17/2015   Procedure: RADIOLOGY WITH ANESTHESIA;  Surgeon: Medication Radiologist, MD;  Location: [facility];  Service: Radiology;  Laterality: N/A;    Medications:  Home meds:  No current facility-administered medications on file prior to encounter.   Current Outpatient Medications on File Prior to Encounter  Medication Sig Dispense Refill   acetaminophen (TYLENOL) 325 MG tablet Take 2 tablets (650 mg total) by mouth every 6 (six) hours as needed for mild pain,  moderate pain or fever (or Fever >/= 101).     allopurinol (ZYLOPRIM) 100 MG tablet Take 100 mg by mouth daily.     amLODipine (NORVASC) 10 MG tablet Take 1 tablet (10 mg total) by mouth daily. 30 tablet 0   calcium-vitamin D (OSCAL WITH D) 500-200 MG-UNIT tablet Take 1 tablet by mouth 2 (two) times daily. 60 tablet 0   carvedilol (COREG) 25 MG tablet Take 1 tablet (25 mg total) by mouth 2 (two) times daily with a meal. 60 tablet 0   ferrous sulfate 325 (65 FE) MG tablet Take 1 tablet (325 mg total) by mouth daily with breakfast. 30 tablet 0   folic acid (FOLVITE) 1 MG tablet Take 1 tablet (1 mg total) by mouth daily. 30 tablet 0   furosemide (LASIX) 20 MG tablet Take 1 tablet (20 mg total) by mouth daily. 30 tablet 0   Levetiracetam (KEPPRA XR) 750 MG TB24 Take 2 tablets (1,500 mg total) by mouth at bedtime. 180 tablet 4   magnesium oxide (MAG-OX) 400 MG tablet Take 1 tablet (400 mg total) by mouth daily. 30 tablet 0   multivitamin (PROSIGHT) TABS tablet Take 1 tablet by mouth daily. 30 each 0   pantoprazole (PROTONIX) 40 MG tablet Take 1 tablet (40 mg total) by mouth daily. 30 tablet 0   rivaroxaban (XARELTO) 20 MG TABS tablet Take 1 tablet (20 mg total) by mouth daily with supper. [place]  tablet 0   thiamine 100 MG tablet Take 1 tablet (100 mg total) by mouth daily. 30 tablet 0   traMADol (ULTRAM) 50 MG tablet Take 1 tablet (50 mg total) by mouth every 6 (six) hours as needed. (Patient taking differently: Take 50 mg by mouth every 6 (six) hours as needed for moderate pain.) 15 tablet 0     Scheduled Meds:  allopurinol  100 mg Oral Daily   amLODipine  10 mg Oral Daily   carvedilol  25 mg Oral BID WC   feeding supplement  237 mL Oral BID BM   furosemide  20 mg Oral Daily   levETIRAcetam  1,500 mg Oral QHS   pantoprazole  40 mg Oral Daily   Continuous Infusions:  sodium chloride 10 mL/hr at 09/24/21 0533   piperacillin-tazobactam (ZOSYN)  IV 3.375 g (09/24/21 0534)   PRN Meds:.sodium  chloride, acetaminophen **OR** acetaminophen, oxyCODONE, senna-docusate  Allergies:  Allergies  Allergen Reactions   Hydrochlorothiazide     REACTION: precipitates gout   Pseudoephedrine     REACTION: rash    Family History  Problem Relation Age of Onset   Aneurysm Sister    Clotting disorder Brother    Aneurysm Mother    Heart attack Father     Social History:  reports that he has never smoked. He has never used smokeless tobacco. He reports that he does not drink alcohol and does not use drugs.  ROS: A complete review of systems was performed.  All systems are negative except for pertinent findings as noted.  Physical Exam:  Vital signs in last 24 hours: Temp:  [98.2 F (36.8 C)-98.9 F (37.2 C)] 98.4 F (36.9 C) (12/29 0431) Pulse Rate:  [61-78] 67 (12/29 0431) Resp:  [16-18] 17 (12/29 0431) BP: (141-180)/(75-91) 141/81 (12/29 0431) SpO2:  [97 %-100 %] 100 % (12/29 0431) Weight:  [76.3 kg] 76.3 kg (12/29 0222) Constitutional:  Alert and oriented, No acute distress Cardiovascular: Regular rate and rhythm, No JVD Respiratory: Normal respiratory effort, Lungs clear bilaterally GI: Abdomen is soft, nontender, nondistended, no abdominal masses Genitourinary: No CVAT. Normal male phallus, testes are descended bilaterally and non-tender and without masses, scrotum is normal in appearance without lesions or masses, perineum is normal on inspection. Rectal: Prostate is enlarged and is quite tender on exam.  No clear fluctuance. Lymphatic: No lymphadenopathy Neurologic: Grossly intact, no focal deficits Psychiatric: Normal mood and affect  Laboratory Data:  Recent Labs    09/23/21 1740 09/24/21 0137  WBC 9.0 9.4  HGB 11.7* 10.8*  HCT 37.6* 33.5*  PLT 268 201    Recent Labs    09/23/21 1740 09/24/21 0137  NA 140 137  K 4.7 4.5  CL 112* 110  GLUCOSE 95 85  BUN 33* 32*  CALCIUM 9.1 8.7*  CREATININE 2.10* 2.12*     Results for orders placed or performed  during the hospital encounter of 09/23/21 (from the past 24 hour(s))  Urinalysis, Routine w reflex microscopic Urine, Clean Catch     Status: Abnormal   Collection Time: 09/23/21  8:11 AM  Result Value Ref Range   Color, Urine YELLOW YELLOW   APPearance HAZY (A) CLEAR   Specific Gravity, Urine 1.015 1.005 - 1.030   pH 5.0 5.0 - 8.0   Glucose, UA NEGATIVE NEGATIVE mg/dL   Hgb urine dipstick SMALL (A) NEGATIVE   Bilirubin Urine NEGATIVE NEGATIVE   Ketones, ur NEGATIVE NEGATIVE mg/dL   Protein, ur >=300 (A) NEGATIVE mg/dL  Nitrite POSITIVE (A) NEGATIVE   Leukocytes,Ua SMALL (A) NEGATIVE   RBC / HPF 0-5 0 - 5 RBC/hpf   WBC, UA >50 (H) 0 - 5 WBC/hpf   Bacteria, UA MANY (A) NONE SEEN   Squamous Epithelial / LPF 0-5 0 - 5   Mucus PRESENT   Lipase, blood     Status: None   Collection Time: 09/23/21  5:40 PM  Result Value Ref Range   Lipase 42 11 - 51 U/L  Comprehensive metabolic panel     Status: Abnormal   Collection Time: 09/23/21  5:40 PM  Result Value Ref Range   Sodium 140 135 - 145 mmol/L   Potassium 4.7 3.5 - 5.1 mmol/L   Chloride 112 (H) 98 - 111 mmol/L   CO2 19 (L) 22 - 32 mmol/L   Glucose, Bld 95 70 - 99 mg/dL   BUN 33 (H) 8 - 23 mg/dL   Creatinine, Ser 2.10 (H) 0.61 - 1.24 mg/dL   Calcium 9.1 8.9 - 10.3 mg/dL   Total Protein 7.9 6.5 - 8.1 g/dL   Albumin 3.2 (L) 3.5 - 5.0 g/dL   AST 21 15 - 41 U/L   ALT 18 0 - 44 U/L   Alkaline Phosphatase 71 38 - 126 U/L   Total Bilirubin 0.5 0.3 - 1.2 mg/dL   GFR, Estimated 33 (L) >60 mL/min   Anion gap 9 5 - 15  CBC     Status: Abnormal   Collection Time: 09/23/21  5:40 PM  Result Value Ref Range   WBC 9.0 4.0 - 10.5 K/uL   RBC 4.28 4.22 - 5.81 MIL/uL   Hemoglobin 11.7 (L) 13.0 - 17.0 g/dL   HCT 37.6 (L) 39.0 - 52.0 %   MCV 87.9 80.0 - 100.0 fL   MCH 27.3 26.0 - 34.0 pg   MCHC 31.1 30.0 - 36.0 g/dL   RDW 14.7 11.5 - 15.5 %   Platelets 268 150 - 400 K/uL   nRBC 0.0 0.0 - 0.2 %  Lactic acid, plasma     Status: None    Collection Time: 09/23/21  5:40 PM  Result Value Ref Range   Lactic Acid, Venous 0.8 0.5 - 1.9 mmol/L  Resp Panel by RT-PCR (Flu A&B, Covid) Nasopharyngeal Swab     Status: None   Collection Time: 09/23/21  9:36 PM   Specimen: Nasopharyngeal Swab; Nasopharyngeal(NP) swabs in vial transport medium  Result Value Ref Range   SARS Coronavirus 2 by RT PCR NEGATIVE NEGATIVE   Influenza A by PCR NEGATIVE NEGATIVE   Influenza B by PCR NEGATIVE NEGATIVE  Basic metabolic panel     Status: Abnormal   Collection Time: 09/24/21  1:37 AM  Result Value Ref Range   Sodium 137 135 - 145 mmol/L   Potassium 4.5 3.5 - 5.1 mmol/L   Chloride 110 98 - 111 mmol/L   CO2 19 (L) 22 - 32 mmol/L   Glucose, Bld 85 70 - 99 mg/dL   BUN 32 (H) 8 - 23 mg/dL   Creatinine, Ser 2.12 (H) 0.61 - 1.24 mg/dL   Calcium 8.7 (L) 8.9 - 10.3 mg/dL   GFR, Estimated 33 (L) >60 mL/min   Anion gap 8 5 - 15  CBC     Status: Abnormal   Collection Time: 09/24/21  1:37 AM  Result Value Ref Range   WBC 9.4 4.0 - 10.5 K/uL   RBC 3.86 (L) 4.22 - 5.81 MIL/uL   Hemoglobin 10.8 (L) 13.0 -  17.0 g/dL   HCT 33.5 (L) 39.0 - 52.0 %   MCV 86.8 80.0 - 100.0 fL   MCH 28.0 26.0 - 34.0 pg   MCHC 32.2 30.0 - 36.0 g/dL   RDW 14.7 11.5 - 15.5 %   Platelets 201 150 - 400 K/uL   nRBC 0.0 0.0 - 0.2 %   Recent Results (from the past 240 hour(s))  Resp Panel by RT-PCR (Flu A&B, Covid) Nasopharyngeal Swab     Status: None   Collection Time: 09/23/21  9:36 PM   Specimen: Nasopharyngeal Swab; Nasopharyngeal(NP) swabs in vial transport medium  Result Value Ref Range Status   SARS Coronavirus 2 by RT PCR NEGATIVE NEGATIVE Final    Comment: (NOTE) SARS-CoV-2 target nucleic acids are NOT DETECTED.  The SARS-CoV-2 RNA is generally detectable in upper respiratory specimens during the acute phase of infection. The lowest concentration of SARS-CoV-2 viral copies this assay can detect is 138 copies/mL. A negative result does not preclude  SARS-Cov-2 infection and should not be used as the sole basis for treatment or other patient management decisions. A negative result may occur with  improper specimen collection/handling, submission of specimen other than nasopharyngeal swab, presence of viral mutation(s) within the areas targeted by this assay, and inadequate number of viral copies(<138 copies/mL). A negative result must be combined with clinical observations, patient history, and epidemiological information. The expected result is Negative.  Fact Sheet for Patients:  EntrepreneurPulse.com.au  Fact Sheet for Healthcare Providers:  IncredibleEmployment.be  This test is no t yet approved or cleared by the [place] FDA and  has been authorized for detection and/or diagnosis of SARS-CoV-2 by FDA under an Emergency Use Authorization (EUA). This EUA will remain  in effect (meaning this test can be used) for the duration of the COVID-19 declaration under Section 564(b)(1) of the Act, 21 U.S.C.section 360bbb-3(b)(1), unless the authorization is terminated  or revoked sooner.       Influenza A by PCR NEGATIVE NEGATIVE Final   Influenza B by PCR NEGATIVE NEGATIVE Final    Comment: (NOTE) The Xpert Xpress SARS-CoV-2/FLU/RSV plus assay is intended as an aid in the diagnosis of influenza from Nasopharyngeal swab specimens and should not be used as a sole basis for treatment. Nasal washings and aspirates are unacceptable for Xpert Xpress SARS-CoV-2/FLU/RSV testing.  Fact Sheet for Patients: EntrepreneurPulse.com.au  Fact Sheet for Healthcare Providers: IncredibleEmployment.be  This test is not yet approved or cleared by the [place] FDA and has been authorized for detection and/or diagnosis of SARS-CoV-2 by FDA under an Emergency Use Authorization (EUA). This EUA will remain in effect (meaning this test can be used) for the duration of  the COVID-19 declaration under Section 564(b)(1) of the Act, 21 U.S.C. section 360bbb-3(b)(1), unless the authorization is terminated or revoked.  Performed at [facility], [place] [address] [place], [place] [address]     Renal Function: Recent Labs    09/23/21 1740 09/24/21 0137  CREATININE 2.10* 2.12*   Estimated Creatinine Clearance: 30.9 mL/min (A) (by C-G formula based on SCr of 2.12 mg/dL (H)).  Radiologic Imaging: CT Renal Stone Study  Result Date: 09/23/2021 CLINICAL DATA:  Hematuria, suprapubic pain, urinary tract infection EXAM: CT ABDOMEN AND PELVIS WITHOUT CONTRAST TECHNIQUE: Multidetector CT imaging of the abdomen and pelvis was performed following the standard protocol without IV contrast. Unenhanced CT was performed per clinician order. Lack of IV contrast limits sensitivity and specificity, especially for evaluation of abdominal/pelvic solid viscera. COMPARISON:  03/08/2017, 05/08/2021 FINDINGS: Lower chest: No acute pleural or parenchymal lung disease. Small hiatal hernia. Hepatobiliary: Unremarkable unenhanced appearance of the liver and gallbladder. Pancreas: Unremarkable unenhanced appearance. Spleen: Unremarkable unenhanced appearance. Adrenals/Urinary Tract: Malrotation of the bilateral kidneys. There are multiple bilateral simple and complex renal cysts. Complex 4.5 cm cyst upper pole right kidney measures 39 Hounsfield units, similar to prior ultrasound exam. 1.2 cm indeterminate lesion within the posterior aspect lower pole left kidney has Hounsfield attenuation of 28. No urinary tract calculi or obstructive uropathy within either kidney. Adrenals are unremarkable. The bladder is decompressed, which limits evaluation. Marked perivesicular fat stranding concerning for cystitis. Stomach/Bowel: No bowel obstruction or ileus. Normal appendix right lower quadrant. No bowel wall thickening. Vascular/Lymphatic: Moderate atherosclerosis of the aorta. No pathologic  adenopathy within the abdomen or pelvis. Reproductive: The prostate is enlarged measuring 5.2 x 6.2 by 6.3 cm. A 3.4 x 3.8 x 2.7 cm hypodense region within the superior right central aspect of the prostate is concerning for underlying prostatic abscess given clinical history of recurrent urinary tract infection. Evaluation of the prostate parenchyma is limited without IV contrast. The seminal vesicles appear unremarkable. Other: No free fluid or free intraperitoneal gas. There is a small fat containing umbilical hernia as well as a fat containing right inguinal hernia. No bowel herniation. Musculoskeletal: No acute or destructive bony lesions. Reconstructed images demonstrate no additional findings. IMPRESSION: 1. Heterogeneous enlarged prostate, with evidence of suspected prostatic abscess. 2. Marked perivesicular fat stranding concerning for cystitis. Bladder is decompressed, which limits evaluation. 3. Bilateral indeterminate renal hypodensities, the largest of which corresponded to a complex cyst on prior ultrasound. Definitive characterization with nonemergent outpatient multiphase renal CT or MRI is recommended. 4. Small hiatal hernia, fat containing umbilical hernia, and fat containing right inguinal hernia as above. 5.  Aortic Atherosclerosis (ICD10-I70.0). Electronically Signed   By: Randa Ngo M.D.   On: 09/23/2021 20:08    I independently reviewed the above imaging studies.  Impression/Recommendation 1) Prostatic abscess:  Continue broad spectrum antibiotics and would consider adding more lipid-soluble therapy such as fluoroquinolone (can discuss with pharmacy) in addition to [place].  Cultures pending.  He needs cystoscopy with transurethral unroofing of the prostatic abscess but is on anticoagulation.  In setting of not being clinically toxic, would be ok to stop anticoagulation and continue IV antibiotics with plans to consider transurethral drainage in 48-72 hrs to minimize risk of bleeding.   Will check bladder scan to ensure he is emptying his bladder.  Dutch Gray 09/24/2021, 7:18 AM    Pryor Curia MD  CC: Dr. Tawanna Solo

## 2021-09-24 NOTE — Progress Notes (Signed)
PROGRESS NOTE    Mitchell Comber Sr.  VOJ:[id number] DOB: [birth year] DOA: 09/23/2021 PCP: [name], MD   Chief Complain: Dysuria/hematuria  Brief Narrative: Patient is a 70 year old male with history of DVT on Xarelto, CVA with residual left sided weakness, chronic combined systolic/diastolic CHF, CKD stage IIIb, seizure who presented to the Emergency Department for the evaluation of dysuria, suprapubic pain, hematuria/pyuria.  He was recently treated for UTI, completed his antibiotics course.  On presentation he was afebrile, hemodynamically stable.  Lab work showed creatinine of 2.1.  Urinalysis was positive for nitrite, many bacteria.  CT abdomen/pelvis showed heterogeneous enlarged prostate with suspected prostatic abscess.  Urology consulted and following.  Currently on broad-spectrum antibiotics.  Culture sent and pending.  Urology planning for cystoscopy with transurethral unroofing of the prostatic abscess.  Assessment & Plan:   Principal Problem:   Prostatic abscess Active Problems:   Chronic combined systolic and diastolic CHF (congestive heart failure) (HCC)   History of stroke   Seizure disorder (HCC)   CKD (chronic kidney disease) stage 3, GFR 30-59 ml/min (HCC)   Kidney lesion, native, bilateral   Urinary tract infection/prostatic abscess: Recently was treated for UTI as an outpatient.  Presented with suprapubic pain, hematuria, dysuria, pyuria UA suggestive  of UTI.  CT abdomen/pelvis as above. Being treated for prostatic abscess.  Continue on Zosyn.  Follow-up urine culture, blood culture. Urology following, plan for cystoscopy with transurethral unroofing of the prostatic abscess, after holding Xarelto for 48 to 72 hours. He feels better today.  Currently afebrile, no leukocytosis  History of chronic combined systolic/diastolic congestive heart failure: EF was 30 to 35% as per echo in 2019.  Currently euvolemic or slightly dehydrated.  On Lasix, Coreg at home.  Currently  Lasix on hold, and gentle IV fluids  AKI on CKD stage IIIb/non-anion gap metabolic acidosis: Creatinine was 1.16 2019.  Creatinine in the range of 2 today.  Continue gentle IV fluids.  Check BMP tomorrow.  Also started on sodium bicarb tablets  Hypertension: Currently blood pressure on the higher side.  On Norvasc, Coreg.  Continue as needed medications for severe hypertension  History of CVA: Has left-sided residual weakness.  He uses cane at baseline for walking  History of seizure: On Keppra at home  History of DVT: On Xarelto at home, currently on hold pending urology procedure  Kidney lesions: Indeterminate hypodensities noted bilaterally on the CT scan.  Outpatient imaging recommended for follow-up            DVT prophylaxis: SCD  Code Status: Full code Family Communication: None at the bedside Patient status: Inpatient  Dispo: The patient is from: Home              Anticipated d/c is to: Home              Anticipated d/c date is: Awaiting urological procedure  Consultants: Urology  Procedures: None yet  Antimicrobials:  Anti-infectives (From admission, onward)    Start     Dose/Rate Route Frequency Ordered Stop   09/24/21 0600  piperacillin-tazobactam (ZOSYN) IVPB 3.375 g       See Hyperspace for full Linked Orders Report.   3.375 g 12.5 mL/hr over 240 Minutes Intravenous Every 8 hours 09/23/21 2215     09/23/21 2230  piperacillin-tazobactam (ZOSYN) IVPB 3.375 g       See Hyperspace for full Linked Orders Report.   3.375 g 100 mL/hr over 30 Minutes Intravenous  Once 09/23/21 2215 09/24/21 0108  09/23/21 1730  cephALEXin (KEFLEX) capsule 500 mg        500 mg Oral  Once 09/23/21 1728 09/23/21 2119       Subjective: Patient seen and examined at the bedside this morning.  Hemodynamically stable.  Comfortable.  He feels better today.  Denies any dysuria or lower abdominal pain currently.  Objective: Vitals:   09/24/21 0056 09/24/21 0222 09/24/21 0431  09/24/21 0841  BP:  (!) 180/75 (!) 141/81 (!) 159/80  Pulse:  74 67 70  Resp:  18 17 15   Temp: 98.8 F (37.1 C) 98.9 F (37.2 C) 98.4 F (36.9 C) 98 F (36.7 C)  TempSrc:  Oral Oral Oral  SpO2:  100% 100% 99%  Weight:  76.3 kg    Height:  5\' 8"  (1.727 m)      Intake/Output Summary (Last 24 hours) at 09/24/2021 1145 Last data filed at 09/24/2021 0920 Gross per 24 hour  Intake 655.43 ml  Output 100 ml  Net 555.43 ml   Filed Weights   09/24/21 0222  Weight: 76.3 kg    Examination:  General exam: Overall comfortable, not in distress HEENT: PERRL Respiratory system:  no wheezes or crackles  Cardiovascular system: S1 & S2 heard, RRR.  Gastrointestinal system: Abdomen is nondistended, soft and nontender. Central nervous system: Alert and oriented Extremities: No edema, no clubbing ,no cyanosis Skin: No rashes, no ulcers,no icterus      Data Reviewed: I have personally reviewed following labs and imaging studies  CBC: Recent Labs  Lab 09/23/21 1740 09/24/21 0137  WBC 9.0 9.4  HGB 11.7* 10.8*  HCT 37.6* 33.5*  MCV 87.9 86.8  PLT 268 854   Basic Metabolic Panel: Recent Labs  Lab 09/23/21 1740 09/24/21 0137  NA 140 137  K 4.7 4.5  CL 112* 110  CO2 19* 19*  GLUCOSE 95 85  BUN 33* 32*  CREATININE 2.10* 2.12*  CALCIUM 9.1 8.7*   GFR: Estimated Creatinine Clearance: 30.9 mL/min (A) (by C-G formula based on SCr of 2.12 mg/dL (H)). Liver Function Tests: Recent Labs  Lab 09/23/21 1740  AST 21  ALT 18  ALKPHOS 71  BILITOT 0.5  PROT 7.9  ALBUMIN 3.2*   Recent Labs  Lab 09/23/21 1740  LIPASE 42   No results for input(s): AMMONIA in the last 168 hours. Coagulation Profile: No results for input(s): INR, PROTIME in the last 168 hours. Cardiac Enzymes: No results for input(s): CKTOTAL, CKMB, CKMBINDEX, TROPONINI in the last 168 hours. BNP (last 3 results) No results for input(s): PROBNP in the last 8760 hours. HbA1C: No results for input(s): HGBA1C  in the last 72 hours. CBG: No results for input(s): GLUCAP in the last 168 hours. Lipid Profile: No results for input(s): CHOL, HDL, LDLCALC, TRIG, CHOLHDL, LDLDIRECT in the last 72 hours. Thyroid Function Tests: No results for input(s): TSH, T4TOTAL, FREET4, T3FREE, THYROIDAB in the last 72 hours. Anemia Panel: No results for input(s): VITAMINB12, FOLATE, FERRITIN, TIBC, IRON, RETICCTPCT in the last 72 hours. Sepsis Labs: Recent Labs  Lab 09/23/21 1740  LATICACIDVEN 0.8    Recent Results (from the past 240 hour(s))  Resp Panel by RT-PCR (Flu A&B, Covid) Nasopharyngeal Swab     Status: None   Collection Time: 09/23/21  9:36 PM   Specimen: Nasopharyngeal Swab; Nasopharyngeal(NP) swabs in vial transport medium  Result Value Ref Range Status   SARS Coronavirus 2 by RT PCR NEGATIVE NEGATIVE Final    Comment: (NOTE) SARS-CoV-2 target nucleic acids  are NOT DETECTED.  The SARS-CoV-2 RNA is generally detectable in upper respiratory specimens during the acute phase of infection. The lowest concentration of SARS-CoV-2 viral copies this assay can detect is 138 copies/mL. A negative result does not preclude SARS-Cov-2 infection and should not be used as the sole basis for treatment or other patient management decisions. A negative result may occur with  improper specimen collection/handling, submission of specimen other than nasopharyngeal swab, presence of viral mutation(s) within the areas targeted by this assay, and inadequate number of viral copies(<138 copies/mL). A negative result must be combined with clinical observations, patient history, and epidemiological information. The expected result is Negative.  Fact Sheet for Patients:  EntrepreneurPulse.com.au  Fact Sheet for Healthcare Providers:  IncredibleEmployment.be  This test is no t yet approved or cleared by the [place] FDA and  has been authorized for detection and/or diagnosis of  SARS-CoV-2 by FDA under an Emergency Use Authorization (EUA). This EUA will remain  in effect (meaning this test can be used) for the duration of the COVID-19 declaration under Section 564(b)(1) of the Act, 21 U.S.C.section 360bbb-3(b)(1), unless the authorization is terminated  or revoked sooner.       Influenza A by PCR NEGATIVE NEGATIVE Final   Influenza B by PCR NEGATIVE NEGATIVE Final    Comment: (NOTE) The Xpert Xpress SARS-CoV-2/FLU/RSV plus assay is intended as an aid in the diagnosis of influenza from Nasopharyngeal swab specimens and should not be used as a sole basis for treatment. Nasal washings and aspirates are unacceptable for Xpert Xpress SARS-CoV-2/FLU/RSV testing.  Fact Sheet for Patients: EntrepreneurPulse.com.au  Fact Sheet for Healthcare Providers: IncredibleEmployment.be  This test is not yet approved or cleared by the [place] FDA and has been authorized for detection and/or diagnosis of SARS-CoV-2 by FDA under an Emergency Use Authorization (EUA). This EUA will remain in effect (meaning this test can be used) for the duration of the COVID-19 declaration under Section 564(b)(1) of the Act, 21 U.S.C. section 360bbb-3(b)(1), unless the authorization is terminated or revoked.  Performed at [facility], [place] [address], [place], [place] [address]   Blood culture (routine x 2)     Status: None (Preliminary result)   Collection Time: 09/23/21  9:38 PM   Specimen: BLOOD LEFT ARM  Result Value Ref Range Status   Specimen Description BLOOD LEFT ARM  Final   Special Requests   Final    BOTTLES DRAWN AEROBIC AND ANAEROBIC Blood Culture adequate volume   Culture   Final    NO GROWTH < 12 HOURS Performed at [facility], [place] [address], [place], [place] [address]    Report Status PENDING  Incomplete  Blood culture (routine x 2)     Status: None (Preliminary result)   Collection Time: 09/23/21  9:43 PM    Specimen: BLOOD RIGHT HAND  Result Value Ref Range Status   Specimen Description BLOOD RIGHT HAND  Final   Special Requests   Final    BOTTLES DRAWN AEROBIC AND ANAEROBIC Blood Culture results may not be optimal due to an inadequate volume of blood received in culture bottles   Culture   Final    NO GROWTH < 12 HOURS Performed at [facility], [place] [address], [place], [place] [address]    Report Status PENDING  Incomplete         Radiology Studies: CT Renal Stone Study  Result Date: 09/23/2021 CLINICAL DATA:  Hematuria, suprapubic pain, urinary tract  infection EXAM: CT ABDOMEN AND PELVIS WITHOUT CONTRAST TECHNIQUE: Multidetector CT imaging of the abdomen and pelvis was performed following the standard protocol without IV contrast. Unenhanced CT was performed per clinician order. Lack of IV contrast limits sensitivity and specificity, especially for evaluation of abdominal/pelvic solid viscera. COMPARISON:  03/08/2017, 05/08/2021 FINDINGS: Lower chest: No acute pleural or parenchymal lung disease. Small hiatal hernia. Hepatobiliary: Unremarkable unenhanced appearance of the liver and gallbladder. Pancreas: Unremarkable unenhanced appearance. Spleen: Unremarkable unenhanced appearance. Adrenals/Urinary Tract: Malrotation of the bilateral kidneys. There are multiple bilateral simple and complex renal cysts. Complex 4.5 cm cyst upper pole right kidney measures 39 Hounsfield units, similar to prior ultrasound exam. 1.2 cm indeterminate lesion within the posterior aspect lower pole left kidney has Hounsfield attenuation of 28. No urinary tract calculi or obstructive uropathy within either kidney. Adrenals are unremarkable. The bladder is decompressed, which limits evaluation. Marked perivesicular fat stranding concerning for cystitis. Stomach/Bowel: No bowel obstruction or ileus. Normal appendix right lower quadrant. No bowel wall thickening. Vascular/Lymphatic: Moderate atherosclerosis of the  aorta. No pathologic adenopathy within the abdomen or pelvis. Reproductive: The prostate is enlarged measuring 5.2 x 6.2 by 6.3 cm. A 3.4 x 3.8 x 2.7 cm hypodense region within the superior right central aspect of the prostate is concerning for underlying prostatic abscess given clinical history of recurrent urinary tract infection. Evaluation of the prostate parenchyma is limited without IV contrast. The seminal vesicles appear unremarkable. Other: No free fluid or free intraperitoneal gas. There is a small fat containing umbilical hernia as well as a fat containing right inguinal hernia. No bowel herniation. Musculoskeletal: No acute or destructive bony lesions. Reconstructed images demonstrate no additional findings. IMPRESSION: 1. Heterogeneous enlarged prostate, with evidence of suspected prostatic abscess. 2. Marked perivesicular fat stranding concerning for cystitis. Bladder is decompressed, which limits evaluation. 3. Bilateral indeterminate renal hypodensities, the largest of which corresponded to a complex cyst on prior ultrasound. Definitive characterization with nonemergent outpatient multiphase renal CT or MRI is recommended. 4. Small hiatal hernia, fat containing umbilical hernia, and fat containing right inguinal hernia as above. 5.  Aortic Atherosclerosis (ICD10-I70.0). Electronically Signed   By: Randa Ngo M.D.   On: 09/23/2021 20:08        Scheduled Meds:  allopurinol  100 mg Oral Daily   amLODipine  10 mg Oral Daily   carvedilol  25 mg Oral BID WC   feeding supplement  237 mL Oral BID BM   levETIRAcetam  1,500 mg Oral QHS   pantoprazole  40 mg Oral Daily   sodium bicarbonate  650 mg Oral BID   Continuous Infusions:  sodium chloride 10 mL/hr at 09/24/21 0533   sodium chloride 75 mL/hr at 09/24/21 0925   piperacillin-tazobactam (ZOSYN)  IV 3.375 g (09/24/21 0534)     LOS: 1 day    Time spent: 35 mins,More than 50% of that time was spent in counseling and/or coordination  of care.      Shelly Coss, MD [facility] P12/29/2022, 11:45 AM

## 2021-09-24 NOTE — Progress Notes (Signed)
Initial Nutrition Assessment  DOCUMENTATION CODES:   Not applicable  INTERVENTION:  - continue Ensure Enlive BID, each supplement provides 350 kcal and 20 grams of protein. - complete NFPE when feasible.   NUTRITION DIAGNOSIS:   Increased nutrient needs related to acute illness as evidenced by estimated needs.  GOAL:   Patient will meet greater than or equal to 90% of their needs  MONITOR:   PO intake, Supplement acceptance, Labs, Weight trends  REASON FOR ASSESSMENT:   Malnutrition Screening Tool  ASSESSMENT:   70 year old male with medical history of DVT on Xarelto, CVA with residual L-sided weakness, CHF, stage 3 CKD, and seizures. He presented to the ED due to dysuria, suprapubic pain, and hematuria/pyuria. He was recently treated for UTI. In the ED, CT abdomen/pelvis showed heterogenous enlarged prostate with suspected prostatic abscess. Urology planning cystoscopy and transurethral unroofing of the prostatic abscess.  Unable to see patient at time of attempted visit. No meal completion percentages documented this admission. Ensure Enlive ordered BID starting this AM and he has accepted both bottles of the supplement offered to him.   Patient has not been seen by a [place] RD since 2016.  Weight today is 168 lb and weight on 07/02/21 was 190 lb. This indicates 22 lb weight loss (11% body weight) in the past ~3 months; significant for time frame.   Labs reviewed; BUN: 32 mg/dl, creatinine: 2.12 mg/dl, Ca: 8.7 mg/dl, GFR: 33 ml/min.  Medications reviewed; 40 mg oral protonix/day, 650 mg sodium bicarb BID.   IVF; NS @ 75 ml/hr.    NUTRITION - FOCUSED PHYSICAL EXAM:  Unable to complete at this time  Diet Order:   Diet Order             Diet Heart Room service appropriate? Yes; Fluid consistency: Thin  Diet effective now                   EDUCATION NEEDS:   No education needs have been identified at this time  Skin:  Skin Assessment: Reviewed RN  Assessment  Last BM:  12/29 (type 5, medium amount)  Height:   Ht Readings from Last 1 Encounters:  09/24/21 5\' 8"  (1.727 m)    Weight:   Wt Readings from Last 1 Encounters:  09/24/21 76.3 kg    Estimated Nutritional Needs:  Kcal:  1700-1950 kcal Protein:  80-95 grams Fluid:  >/= 1.8 L/day    Jarome Matin, MS, RD, LDN, CNSC Inpatient Clinical Dietitian RD pager # available in [facility]  After hours/weekend pager # available in [facility]

## 2021-09-25 LAB — BASIC METABOLIC PANEL
Anion gap: 8 (ref 5–15)
BUN: 40 mg/dL — ABNORMAL HIGH (ref 8–23)
CO2: 20 mmol/L — ABNORMAL LOW (ref 22–32)
Calcium: 8.3 mg/dL — ABNORMAL LOW (ref 8.9–10.3)
Chloride: 109 mmol/L (ref 98–111)
Creatinine, Ser: 2.19 mg/dL — ABNORMAL HIGH (ref 0.61–1.24)
GFR, Estimated: 31 mL/min — ABNORMAL LOW (ref >60)
Glucose, Bld: 85 mg/dL (ref 70–99)
Potassium: 4.3 mmol/L (ref 3.5–5.1)
Sodium: 137 mmol/L (ref 135–145)

## 2021-09-25 LAB — URINE CULTURE: Culture: >=100000 — AB

## 2021-09-25 LAB — CBC
HCT: 30.8 % — ABNORMAL LOW (ref 39.0–52.0)
Hemoglobin: 10.1 g/dL — ABNORMAL LOW (ref 13.0–17.0)
MCH: 28 pg (ref 26.0–34.0)
MCHC: 32.8 g/dL (ref 30.0–36.0)
MCV: 85.3 fL (ref 80.0–100.0)
Platelets: 221 10*3/uL (ref 150–400)
RBC: 3.61 MIL/uL — ABNORMAL LOW (ref 4.22–5.81)
RDW: 14.6 % (ref 11.5–15.5)
WBC: 9.6 10*3/uL (ref 4.0–10.5)
nRBC: 0 % (ref 0.0–0.2)

## 2021-09-25 MED ORDER — SODIUM CHLORIDE 0.9 % IV SOLN
2.0000 g | INTRAVENOUS | Status: DC
  Administered 2021-09-25 – 2021-09-26 (×2): 2 g via INTRAVENOUS
  Filled 2021-09-25 (×3): qty 20

## 2021-09-25 MED ORDER — SODIUM CHLORIDE 0.9 % IV BOLUS
500.0000 mL | Freq: Once | INTRAVENOUS | Status: AC
  Administered 2021-09-25: 10:00:00 500 mL via INTRAVENOUS

## 2021-09-25 MED ORDER — BISACODYL 10 MG RE SUPP
10.0000 mg | Freq: Once | RECTAL | Status: AC
  Administered 2021-09-25: 09:00:00 10 mg via RECTAL
  Filled 2021-09-25: qty 1

## 2021-09-25 NOTE — Progress Notes (Signed)
I have discussed the pt w/ Dr [name] this AM. I have reviewed the pt's chart/CT scan images. I have introduced myself to the pt and discussed the procedure of TUR unroofing of prostatic abscess. All questions have been answered. I plan on proceeding @ 0800 on 12.31.2022.

## 2021-09-25 NOTE — Progress Notes (Signed)
Pharmacy Antibiotic Note  Halo Laski. is a 70 y.o. male admitted on 09/23/2021 with  prostate abscess .  Pharmacy has been consulted for zosyn dosing.  SCr 2.19 - above baseline but stable WBC WNL Afebrile  Plan: Continue Zosyn 3.375g IV q8h (4 hour infusion) Pharmacy will sign off and follow remotely  Height: 5\' 8"  (172.7 cm) Weight: 78.6 kg (173 lb 4.5 oz) IBW/kg (Calculated) : 68.4  Temp (24hrs), Avg:98.1 F (36.7 C), Min:97.9 F (36.6 C), Max:98.3 F (36.8 C)  Recent Labs  Lab 09/23/21 1740 09/24/21 0137 09/25/21 0306  WBC 9.0 9.4 9.6  CREATININE 2.10* 2.12* 2.19*  LATICACIDVEN 0.8  --   --      Estimated Creatinine Clearance: 29.9 mL/min (A) (by C-G formula based on SCr of 2.19 mg/dL (H)).    Allergies  Allergen Reactions   Hydrochlorothiazide     REACTION: precipitates gout   Pseudoephedrine     REACTION: rash   Ulice Dash, PharmD  09/25/2021 12:59 PM

## 2021-09-25 NOTE — Progress Notes (Signed)
Patient ID: Mitchell [name] Sr., male   DOB: 12-15-1950, 70 y.o.   MRN: [id number]   Plan for patient to go to OR tomorrow morning for cystoscopy and transurethral unroofing of the prostate per Dr. [name].  Cultures with E coli and sensitivities still pending.  Please have him remain off anticoagulation so he can proceed with procedure tomorrow.   Will need 4-6 weeks of appropriate antibiotic therapy following drainage.

## 2021-09-25 NOTE — Progress Notes (Signed)
PROGRESS NOTE    Mitchell Comber Sr.  HYI:[id number] DOB: 17-Mar-1951 DOA: 09/23/2021 PCP: [name], MD   Chief Complaint  Patient presents with   Hematuria    Brief Narrative:  Patient is a 70 year old male with history of DVT on Xarelto, CVA with residual left sided weakness, chronic combined systolic/diastolic CHF, CKD stage IIIb, seizure who presented to the Emergency Department for the evaluation of dysuria, suprapubic pain, hematuria/pyuria.  He was recently treated for UTI, completed his antibiotics course.  On presentation he was afebrile, hemodynamically stable.  Lab work showed creatinine of 2.1.  Urinalysis was positive for nitrite, many bacteria.  CT abdomen/pelvis showed heterogeneous enlarged prostate with suspected prostatic abscess.  Urology consulted and following.  Currently on broad-spectrum antibiotics.  Culture sent and pending.  Urology planning for cystoscopy with transurethral unroofing of the prostatic abscess.   Assessment & Plan:   Principal Problem:   Prostatic abscess Active Problems:   Chronic combined systolic and diastolic CHF (congestive heart failure) (HCC)   History of stroke   Seizure disorder (HCC)   CKD (chronic kidney disease) stage 3, GFR 30-59 ml/min (HCC)   Kidney lesion, native, bilateral  #1 E. coli UTI/prostatic abscess -Patient presented with dysuria, suprapubic abdominal pain, hematuria, pyuria. -Urinalysis consistent with UTI. -CT abdomen and pelvis concerning for suspected prostatic abscess. -Urine cultures with > 100,000 colonies of E. coli with intermediate sensitivity to nitrofurantoin however pansensitive. -Change IV Zosyn to IV Rocephin 2 g daily. -Patient with some clinical improvement. -Patient seen in consultation by urology plan for cystoscopy with transurethral unroofing of prostatic abscess to be done tomorrow 09/26/2021. -Continue to hold Xarelto. -Per urology.  2.  History of combined chronic systolic/diastolic CHF -EF  noted to be 30 to 35% per 2D echo 2019. -Currently euvolemic. -Continue to hold Lasix. -Continue home regimen Coreg. -Gentle hydration.  3.  Seizure disorder -Stable. -Continue home regimen Keppra.  4.  Hypertension -Norvasc, Coreg.  5.  AKI on CKD stage IIIb/none anion gap metabolic acidosis -Creatinine noted to be 1.16 in 2019. -On presentation creatinine in the twos and currently at 2.19. -Continue to hold Lasix. -Continue gentle hydration, bicarb tablets.  6.  History of CVA -Patient with left residual weakness. -Uses a cane at baseline for walking. -Was on Xarelto however currently on hold in anticipation of procedure secondary to problem 1.  7.  History of DVT -On Xarelto prior to admission currently on hold in anticipation of urological procedure tomorrow.  8.  Kidney lesions -Indeterminate hypodensities noted bilaterally on CT scan on presentation. -Outpatient imaging/follow-up recommended.   DVT prophylaxis: SCDs Code Status: Full Family Communication: Updated patient, wife, brother at bedside. Disposition:   Status is: Inpatient  Remains inpatient appropriate because: Severity of illness.       Consultants:  Urology: Dr. [name] 09/24/2021  Procedures:  CT renal stone protocol 09/23/2021  Antimicrobials:  IV Zosyn 09/24/2021>>>>09/25/2021 IV Rocephin 09/25/2021>>>>   Subjective: Still with states some lower abdominal pain with dysuria and urinating however significant improvement since admission.  No chest pain.  No shortness of breath.  Tolerating current diet.  Wife and brother at bedside.  Objective: Vitals:   09/24/21 2319 09/25/21 0500 09/25/21 0937 09/25/21 1349  BP: (!) 144/70  (!) 153/86 (!) 152/80  Pulse: 75  80 79  Resp: 18  18 18   Temp: 98.3 F (36.8 C)  98.3 F (36.8 C) (!) 97.4 F (36.3 C)  TempSrc: Oral   Oral  SpO2: 95%  100%  98%  Weight:  78.6 kg    Height:        Intake/Output Summary (Last 24 hours) at 09/25/2021  1721 Last data filed at 09/25/2021 0800 Gross per 24 hour  Intake 1704.75 ml  Output 1201 ml  Net 503.75 ml   Filed Weights   09/24/21 0222 09/25/21 0500  Weight: 76.3 kg 78.6 kg    Examination:  General exam: Appears calm and comfortable  Respiratory system: Clear to auscultation. Respiratory effort normal. Cardiovascular system: S1 & S2 heard, RRR. No JVD, murmurs, rubs, gallops or clicks. No pedal edema. Gastrointestinal system: Abdomen is nondistended, soft and nontender. No organomegaly or masses felt. Normal bowel sounds heard. Central nervous system: Alert and oriented. No focal neurological deficits. Extremities: Symmetric 5 x 5 power. Skin: No rashes, lesions or ulcers Psychiatry: Judgement and insight appear normal. Mood & affect appropriate.     Data Reviewed: I have personally reviewed following labs and imaging studies  CBC: Recent Labs  Lab 09/23/21 1740 09/24/21 0137 09/25/21 0306  WBC 9.0 9.4 9.6  HGB 11.7* 10.8* 10.1*  HCT 37.6* 33.5* 30.8*  MCV 87.9 86.8 85.3  PLT 268 201 425    Basic Metabolic Panel: Recent Labs  Lab 09/23/21 1740 09/24/21 0137 09/25/21 0306  NA 140 137 137  K 4.7 4.5 4.3  CL 112* 110 109  CO2 19* 19* 20*  GLUCOSE 95 85 85  BUN 33* 32* 40*  CREATININE 2.10* 2.12* 2.19*  CALCIUM 9.1 8.7* 8.3*    GFR: Estimated Creatinine Clearance: 29.9 mL/min (A) (by C-G formula based on SCr of 2.19 mg/dL (H)).  Liver Function Tests: Recent Labs  Lab 09/23/21 1740  AST 21  ALT 18  ALKPHOS 71  BILITOT 0.5  PROT 7.9  ALBUMIN 3.2*    CBG: No results for input(s): GLUCAP in the last 168 hours.   Recent Results (from the past 240 hour(s))  Urine Culture     Status: Abnormal   Collection Time: 09/23/21  2:25 PM   Specimen: Urine, Clean Catch  Result Value Ref Range Status   Specimen Description URINE, CLEAN CATCH  Final   Special Requests   Final    NONE Performed at [facility], [address], [place],  [place] [address]    Culture >=100,000 COLONIES/mL ESCHERICHIA COLI (A)  Final   Report Status 09/25/2021 FINAL  Final   Organism ID, Bacteria ESCHERICHIA COLI (A)  Final      Susceptibility   Escherichia coli - MIC*    AMPICILLIN <=2 SENSITIVE Sensitive     CEFAZOLIN <=4 SENSITIVE Sensitive     CEFEPIME <=0.12 SENSITIVE Sensitive     CEFTRIAXONE <=0.25 SENSITIVE Sensitive     CIPROFLOXACIN <=0.25 SENSITIVE Sensitive     GENTAMICIN <=1 SENSITIVE Sensitive     IMIPENEM <=0.25 SENSITIVE Sensitive     NITROFURANTOIN 64 INTERMEDIATE Intermediate     TRIMETH/SULFA <=20 SENSITIVE Sensitive     AMPICILLIN/SULBACTAM <=2 SENSITIVE Sensitive     PIP/TAZO <=4 SENSITIVE Sensitive     * >=100,000 COLONIES/mL ESCHERICHIA COLI  Resp Panel by RT-PCR (Flu A&B, Covid) Nasopharyngeal Swab     Status: None   Collection Time: 09/23/21  9:36 PM   Specimen: Nasopharyngeal Swab; Nasopharyngeal(NP) swabs in vial transport medium  Result Value Ref Range Status   SARS Coronavirus 2 by RT PCR NEGATIVE NEGATIVE Final    Comment: (NOTE) SARS-CoV-2 target nucleic acids are NOT DETECTED.  The SARS-CoV-2 RNA is generally detectable  in upper respiratory specimens during the acute phase of infection. The lowest concentration of SARS-CoV-2 viral copies this assay can detect is 138 copies/mL. A negative result does not preclude SARS-Cov-2 infection and should not be used as the sole basis for treatment or other patient management decisions. A negative result may occur with  improper specimen collection/handling, submission of specimen other than nasopharyngeal swab, presence of viral mutation(s) within the areas targeted by this assay, and inadequate number of viral copies(<138 copies/mL). A negative result must be combined with clinical observations, patient history, and epidemiological information. The expected result is Negative.  Fact Sheet for Patients:  EntrepreneurPulse.com.au  Fact Sheet for  Healthcare Providers:  IncredibleEmployment.be  This test is no t yet approved or cleared by the [place] FDA and  has been authorized for detection and/or diagnosis of SARS-CoV-2 by FDA under an Emergency Use Authorization (EUA). This EUA will remain  in effect (meaning this test can be used) for the duration of the COVID-19 declaration under Section 564(b)(1) of the Act, 21 U.S.C.section 360bbb-3(b)(1), unless the authorization is terminated  or revoked sooner.       Influenza A by PCR NEGATIVE NEGATIVE Final   Influenza B by PCR NEGATIVE NEGATIVE Final    Comment: (NOTE) The Xpert Xpress SARS-CoV-2/FLU/RSV plus assay is intended as an aid in the diagnosis of influenza from Nasopharyngeal swab specimens and should not be used as a sole basis for treatment. Nasal washings and aspirates are unacceptable for Xpert Xpress SARS-CoV-2/FLU/RSV testing.  Fact Sheet for Patients: EntrepreneurPulse.com.au  Fact Sheet for Healthcare Providers: IncredibleEmployment.be  This test is not yet approved or cleared by the [place] FDA and has been authorized for detection and/or diagnosis of SARS-CoV-2 by FDA under an Emergency Use Authorization (EUA). This EUA will remain in effect (meaning this test can be used) for the duration of the COVID-19 declaration under Section 564(b)(1) of the Act, 21 U.S.C. section 360bbb-3(b)(1), unless the authorization is terminated or revoked.  Performed at [facility], [place] [address] [place], [place] [address]   Blood culture (routine x 2)     Status: None (Preliminary result)   Collection Time: 09/23/21  9:38 PM   Specimen: BLOOD LEFT ARM  Result Value Ref Range Status   Specimen Description BLOOD LEFT ARM  Final   Special Requests   Final    BOTTLES DRAWN AEROBIC AND ANAEROBIC Blood Culture adequate volume   Culture   Final    NO GROWTH 1 DAY Performed at [place], [place] [address], [place], [place] [address]    Report Status PENDING  Incomplete  Blood culture (routine x 2)     Status: None (Preliminary result)   Collection Time: 09/23/21  9:43 PM   Specimen: BLOOD RIGHT HAND  Result Value Ref Range Status   Specimen Description BLOOD RIGHT HAND  Final   Special Requests   Final    BOTTLES DRAWN AEROBIC AND ANAEROBIC Blood Culture results may not be optimal due to an inadequate volume of blood received in culture bottles   Culture   Final    NO GROWTH 1 DAY Performed at [facility], [place] [address] [place], [place] [address]    Report Status PENDING  Incomplete         Radiology Studies: CT Renal Stone Study  Result Date: 09/23/2021 CLINICAL DATA:  Hematuria, suprapubic pain, urinary tract infection EXAM: CT ABDOMEN AND PELVIS WITHOUT CONTRAST TECHNIQUE: Multidetector CT imaging  of the abdomen and pelvis was performed following the standard protocol without IV contrast. Unenhanced CT was performed per clinician order. Lack of IV contrast limits sensitivity and specificity, especially for evaluation of abdominal/pelvic solid viscera. COMPARISON:  03/08/2017, 05/08/2021 FINDINGS: Lower chest: No acute pleural or parenchymal lung disease. Small hiatal hernia. Hepatobiliary: Unremarkable unenhanced appearance of the liver and gallbladder. Pancreas: Unremarkable unenhanced appearance. Spleen: Unremarkable unenhanced appearance. Adrenals/Urinary Tract: Malrotation of the bilateral kidneys. There are multiple bilateral simple and complex renal cysts. Complex 4.5 cm cyst upper pole right kidney measures 39 Hounsfield units, similar to prior ultrasound exam. 1.2 cm indeterminate lesion within the posterior aspect lower pole left kidney has Hounsfield attenuation of 28. No urinary tract calculi or obstructive uropathy within either kidney. Adrenals are unremarkable. The bladder is decompressed, which limits evaluation. Marked perivesicular fat stranding  concerning for cystitis. Stomach/Bowel: No bowel obstruction or ileus. Normal appendix right lower quadrant. No bowel wall thickening. Vascular/Lymphatic: Moderate atherosclerosis of the aorta. No pathologic adenopathy within the abdomen or pelvis. Reproductive: The prostate is enlarged measuring 5.2 x 6.2 by 6.3 cm. A 3.4 x 3.8 x 2.7 cm hypodense region within the superior right central aspect of the prostate is concerning for underlying prostatic abscess given clinical history of recurrent urinary tract infection. Evaluation of the prostate parenchyma is limited without IV contrast. The seminal vesicles appear unremarkable. Other: No free fluid or free intraperitoneal gas. There is a small fat containing umbilical hernia as well as a fat containing right inguinal hernia. No bowel herniation. Musculoskeletal: No acute or destructive bony lesions. Reconstructed images demonstrate no additional findings. IMPRESSION: 1. Heterogeneous enlarged prostate, with evidence of suspected prostatic abscess. 2. Marked perivesicular fat stranding concerning for cystitis. Bladder is decompressed, which limits evaluation. 3. Bilateral indeterminate renal hypodensities, the largest of which corresponded to a complex cyst on prior ultrasound. Definitive characterization with nonemergent outpatient multiphase renal CT or MRI is recommended. 4. Small hiatal hernia, fat containing umbilical hernia, and fat containing right inguinal hernia as above. 5.  Aortic Atherosclerosis (ICD10-I70.0). Electronically Signed   By: Randa Ngo M.D.   On: 09/23/2021 20:08        Scheduled Meds:  allopurinol  100 mg Oral Daily   amLODipine  10 mg Oral Daily   carvedilol  25 mg Oral BID WC   feeding supplement  237 mL Oral BID BM   levETIRAcetam  1,500 mg Oral QHS   pantoprazole  40 mg Oral Daily   sodium bicarbonate  650 mg Oral BID   Continuous Infusions:  sodium chloride 10 mL/hr at 09/24/21 0533   sodium chloride 100 mL/hr at  09/25/21 1553   piperacillin-tazobactam (ZOSYN)  IV 3.375 g (09/25/21 1555)     LOS: 2 days    Time spent: 40 minutes    Irine Seal, MD [facility]   To contact the attending provider between 7A-7P or the covering provider during after hours 7P-7A, please log into the web site [email or web address] and access using universal [place] password for that web site. If you do not have the password, please call the hospital operator.  09/25/2021, 5:21 PM

## 2021-09-26 ENCOUNTER — Encounter (HOSPITAL_COMMUNITY)
Admission: EM | Disposition: A | Discharge status: Home / Self Care | Payer: Self-pay | Source: Home / Self Care | Attending: Internal Medicine

## 2021-09-26 ENCOUNTER — Encounter (HOSPITAL_COMMUNITY): Payer: Self-pay | Admitting: Family Medicine

## 2021-09-26 ENCOUNTER — Inpatient Hospital Stay (HOSPITAL_COMMUNITY): Payer: Medicare Other | Admitting: Certified Registered Nurse Anesthetist

## 2021-09-26 HISTORY — PX: TRANSURETHRAL RESECTION OF PROSTATE: SHX73

## 2021-09-26 LAB — CBC
HCT: 30.6 % — ABNORMAL LOW (ref 39.0–52.0)
Hemoglobin: 9.7 g/dL — ABNORMAL LOW (ref 13.0–17.0)
MCH: 27.4 pg (ref 26.0–34.0)
MCHC: 31.7 g/dL (ref 30.0–36.0)
MCV: 86.4 fL (ref 80.0–100.0)
Platelets: 176 10*3/uL (ref 150–400)
RBC: 3.54 MIL/uL — ABNORMAL LOW (ref 4.22–5.81)
RDW: 14.6 % (ref 11.5–15.5)
WBC: 8.3 10*3/uL (ref 4.0–10.5)
nRBC: 0 % (ref 0.0–0.2)

## 2021-09-26 LAB — BASIC METABOLIC PANEL
Anion gap: 6 (ref 5–15)
BUN: 28 mg/dL — ABNORMAL HIGH (ref 8–23)
CO2: 21 mmol/L — ABNORMAL LOW (ref 22–32)
Calcium: 8 mg/dL — ABNORMAL LOW (ref 8.9–10.3)
Chloride: 114 mmol/L — ABNORMAL HIGH (ref 98–111)
Creatinine, Ser: 1.99 mg/dL — ABNORMAL HIGH (ref 0.61–1.24)
GFR, Estimated: 35 mL/min — ABNORMAL LOW (ref >60)
Glucose, Bld: 79 mg/dL (ref 70–99)
Potassium: 4.2 mmol/L (ref 3.5–5.1)
Sodium: 141 mmol/L (ref 135–145)

## 2021-09-26 SURGERY — TURP (TRANSURETHRAL RESECTION OF PROSTATE)
Anesthesia: General | Site: Prostate

## 2021-09-26 MED ORDER — ACETAMINOPHEN 10 MG/ML IV SOLN
INTRAVENOUS | Status: DC | PRN
  Administered 2021-09-26: 1000 mg via INTRAVENOUS

## 2021-09-26 MED ORDER — SODIUM CHLORIDE 0.9 % IR SOLN
3000.0000 mL | Status: DC
  Administered 2021-09-26: 3000 mL

## 2021-09-26 MED ORDER — LACTATED RINGERS IV SOLN
INTRAVENOUS | Status: DC | PRN
Start: 2021-09-26 — End: 2021-09-26

## 2021-09-26 MED ORDER — ONDANSETRON HCL 4 MG/2ML IJ SOLN
INTRAMUSCULAR | Status: DC | PRN
  Administered 2021-09-26: 4 mg via INTRAVENOUS

## 2021-09-26 MED ORDER — SODIUM CHLORIDE 0.9 % IR SOLN
Status: DC | PRN
  Administered 2021-09-26: 6000 mL via INTRAVESICAL

## 2021-09-26 MED ORDER — LIDOCAINE 2% (20 MG/ML) 5 ML SYRINGE
INTRAMUSCULAR | Status: DC | PRN
  Administered 2021-09-26: 80 mg via INTRAVENOUS

## 2021-09-26 MED ORDER — ONDANSETRON HCL 4 MG/2ML IJ SOLN: 4.0000 mg | Freq: Once | INTRAMUSCULAR | Status: DC | PRN

## 2021-09-26 MED ORDER — FENTANYL CITRATE (PF) 100 MCG/2ML IJ SOLN
INTRAMUSCULAR | Status: DC | PRN
  Administered 2021-09-26 (×2): 50 ug via INTRAVENOUS

## 2021-09-26 MED ORDER — PROPOFOL 10 MG/ML IV BOLUS
INTRAVENOUS | Status: AC
  Filled 2021-09-26: qty 20

## 2021-09-26 MED ORDER — DEXAMETHASONE SODIUM PHOSPHATE 10 MG/ML IJ SOLN
INTRAMUSCULAR | Status: DC | PRN
  Administered 2021-09-26: 5 mg via INTRAVENOUS

## 2021-09-26 MED ORDER — CHLORHEXIDINE GLUCONATE CLOTH 2 % EX PADS
6.0000 | MEDICATED_PAD | Freq: Every day | CUTANEOUS | Status: DC
  Administered 2021-09-27: 6 via TOPICAL

## 2021-09-26 MED ORDER — FENTANYL CITRATE (PF) 100 MCG/2ML IJ SOLN
INTRAMUSCULAR | Status: AC
  Filled 2021-09-26: qty 2

## 2021-09-26 MED ORDER — FENTANYL CITRATE PF 50 MCG/ML IJ SOSY: 25.0000 ug | PREFILLED_SYRINGE | INTRAMUSCULAR | Status: DC | PRN

## 2021-09-26 MED ORDER — PROPOFOL 10 MG/ML IV BOLUS
INTRAVENOUS | Status: DC | PRN
  Administered 2021-09-26: 50 mg via INTRAVENOUS
  Administered 2021-09-26: 30 mg via INTRAVENOUS
  Administered 2021-09-26: 40 mg via INTRAVENOUS

## 2021-09-26 MED ORDER — ACETAMINOPHEN 10 MG/ML IV SOLN
INTRAVENOUS | Status: AC
  Filled 2021-09-26: qty 100

## 2021-09-26 SURGICAL SUPPLY — 20 items
BAG DRN RND TRDRP ANRFLXCHMBR (UROLOGICAL SUPPLIES) ×1
BAG URINE DRAIN 2000ML AR STRL (UROLOGICAL SUPPLIES) ×4 IMPLANT
BAG URO CATCHER STRL LF (MISCELLANEOUS) ×4 IMPLANT
BLADE SURG 15 STRL LF DISP TIS (BLADE) IMPLANT
BLADE SURG 15 STRL SS (BLADE)
CATH HEMA 3WAY 30CC 22FR COUDE (CATHETERS) ×4 IMPLANT
DRAPE FOOT SWITCH (DRAPES) ×4 IMPLANT
EVACUATOR MICROVAS BLADDER (UROLOGICAL SUPPLIES) ×4 IMPLANT
GLOVE SURG ENC TEXT LTX SZ8 (GLOVE) ×4 IMPLANT
GOWN STRL REUS W/TWL XL LVL3 (GOWN DISPOSABLE) ×4 IMPLANT
HOLDER FOLEY CATH W/STRAP (MISCELLANEOUS) IMPLANT
LOOP CUT BIPOLAR 24F LRG (ELECTROSURGICAL) ×4 IMPLANT
MANIFOLD NEPTUNE II (INSTRUMENTS) ×4 IMPLANT
PACK CYSTO (CUSTOM PROCEDURE TRAY) ×4 IMPLANT
PENCIL SMOKE EVACUATOR (MISCELLANEOUS) IMPLANT
SUT ETHILON 3 0 PS 1 (SUTURE) IMPLANT
SYR 30ML LL (SYRINGE) IMPLANT
TUBING CONNECTING 10 (TUBING) ×3 IMPLANT
TUBING CONNECTING 10' (TUBING) ×1
TUBING UROLOGY SET (TUBING) ×4 IMPLANT

## 2021-09-26 NOTE — Transfer of Care (Signed)
Immediate Anesthesia Transfer of Care Note  Patient: Mitchell Comber Sr.  Procedure(s) Performed: TRANSURETHRAL RESECTION AND UNROOFING OF PROSTATIC ABSCESS (Prostate)  Patient Location: PACU  Anesthesia Type:General  Level of Consciousness: awake, alert  and oriented  Airway & Oxygen Therapy: Patient Spontanous Breathing and Patient connected to face mask oxygen  Post-op Assessment: Report given to RN and Post -op Vital signs reviewed and stable  Post vital signs: Reviewed and stable  Last Vitals:  Vitals Value Taken Time  BP 147/74 09/26/21 0852  Temp    Pulse 68 09/26/21 0853  Resp 23 09/26/21 0853  SpO2 100 % 09/26/21 0853  Vitals shown include unvalidated device data.  Last Pain:  Vitals:   09/26/21 0435  TempSrc: Oral  PainSc:       Patients Stated Pain Goal: 0 (97/94/80 1655)  Complications: No notable events documented.

## 2021-09-26 NOTE — Progress Notes (Addendum)
PROGRESS NOTE    Mitchell Comber Sr.  TIR:[id number] DOB: 07/18/1951 DOA: 09/23/2021 PCP: [name], MD   Chief Complaint  Patient presents with   Hematuria    Brief Narrative:  Patient is a 70 year old male with history of DVT on Xarelto, CVA with residual left sided weakness, chronic combined systolic/diastolic CHF, CKD stage IIIb, seizure who presented to the Emergency Department for the evaluation of dysuria, suprapubic pain, hematuria/pyuria.  He was recently treated for UTI, completed his antibiotics course.  On presentation he was afebrile, hemodynamically stable.  Lab work showed creatinine of 2.1.  Urinalysis was positive for nitrite, many bacteria.  CT abdomen/pelvis showed heterogeneous enlarged prostate with suspected prostatic abscess.  Urology consulted and following.  Currently on broad-spectrum antibiotics.  Culture sent and pending.  Urology planning for cystoscopy with transurethral unroofing of the prostatic abscess.   Assessment & Plan:   Principal Problem:   Prostate abscess Active Problems:   Chronic combined systolic and diastolic CHF (congestive heart failure) (HCC)   History of stroke   Seizure disorder (HCC)   CKD (chronic kidney disease) stage 3, GFR 30-59 ml/min (HCC)   Kidney lesion, native, bilateral  #1 E. coli UTI/prostatic abscess -Patient presented with dysuria, suprapubic abdominal pain, hematuria, pyuria. -Urinalysis consistent with UTI. -CT abdomen and pelvis concerning for suspected prostatic abscess. -Urine cultures with > 100,000 colonies of E. coli with intermediate sensitivity to nitrofurantoin however pansensitive. -Was on IV Zosyn and narrowed to IV Rocephin.   -We will likely need at least 2 weeks worth of antibiotics.   -Improving clinically. -Patient seen in consultation by urology underwent cystoscopy and transurethral unroofing of prostatic abscess today 09/26/2021. -Continue to hold Xarelto. -Per urology.  2.  History of combined  chronic systolic/diastolic CHF -EF noted to be 30 to 35% per 2D echo 2019. -Currently euvolemic. -Continue to hold Lasix. -Continue home regimen Coreg. -Gentle hydration.  3.  Seizure disorder -Stable. -Keppra.   4.  Hypertension -Norvasc, Coreg. -Lasix on hold.  5.  AKI on CKD stage IIIb/none anion gap metabolic acidosis -Creatinine noted to be 1.16 in 2019. -On presentation creatinine in the twos and slowly trending down with hydration.  Creatinine currently at 1.99.   -Continue to hold Lasix, gentle hydration.   -Continue bicarb tablets.    6.  History of CVA -Patient with left residual weakness. -Uses a cane at baseline for walking. -Was on Xarelto however currently on hold as patient noted to have urological procedure today secondary to problem #1.    7.  History of DVT -On Xarelto prior to admission currently on hold as patient underwent urological procedure today.   -Urology to advise when anticoagulation may be resumed.    8.  Kidney lesions -Indeterminate hypodensities noted bilaterally on CT scan on presentation. -Outpatient imaging/follow-up recommended.   DVT prophylaxis: SCDs Code Status: Full Family Communication: Updated patient, wife, brother at bedside. Disposition:   Status is: Inpatient  Remains inpatient appropriate because: Severity of illness.       Consultants:  Urology: Dr. [name] 09/24/2021  Procedures:  CT renal stone protocol 09/23/2021 Transurethral resection of prostate/unroofing of prostatic abscess per Dr. [name] 09/26/2021  Antimicrobials:  IV Zosyn 09/24/2021>>>>09/25/2021 IV Rocephin 09/25/2021>>>>   Subjective: Sitting up in bed eating lunch.  Denies any chest pain.  No shortness of breath.  Feels lower abdominal pain has improved.  Denies any significant dysuria.  Wife and daughter at bedside.    Objective: Vitals:   09/26/21 0915 09/26/21 0930  09/26/21 0959 09/26/21 1319  BP: (!) 159/74 (!) 148/73 (!) 167/77 (!)  155/75  Pulse: 61 (!) 58 66 61  Resp: 10 12 16 15   Temp:  98.8 F (37.1 C) 98.8 F (37.1 C) 98.6 F (37 C)  TempSrc:   Oral Oral  SpO2: 99% 98% 100% 96%  Weight:      Height:        Intake/Output Summary (Last 24 hours) at 09/26/2021 1522 Last data filed at 09/26/2021 1324 Gross per 24 hour  Intake 2313.82 ml  Output 4300 ml  Net -1986.18 ml    Filed Weights   09/24/21 0222 09/25/21 0500  Weight: 76.3 kg 78.6 kg    Examination:  General exam: NAD. Respiratory system: CTA B anterior lung fields.  No wheezes, no crackles, no rhonchi.  Normal respiratory effort.  Speaking in full sentences.  Cardiovascular system: Regular rate rhythm no murmurs rubs or gallops.  No JVD.  No lower extremity edema.   Gastrointestinal system: Abdomen is soft, improved suprapubic tenderness to palpation, nondistended, positive bowel sounds.  No rebound.  No guarding.  Central nervous system: Alert and oriented.  Moving extremities spontaneously.  No focal neurological deficits.   Extremities: Symmetric 5 x 5 power. Skin: No rashes, lesions or ulcers Psychiatry: Judgement and insight appear normal. Mood & affect appropriate.     Data Reviewed: I have personally reviewed following labs and imaging studies  CBC: Recent Labs  Lab 09/23/21 1740 09/24/21 0137 09/25/21 0306 09/26/21 0316  WBC 9.0 9.4 9.6 8.3  HGB 11.7* 10.8* 10.1* 9.7*  HCT 37.6* 33.5* 30.8* 30.6*  MCV 87.9 86.8 85.3 86.4  PLT 268 201 221 176     Basic Metabolic Panel: Recent Labs  Lab 09/23/21 1740 09/24/21 0137 09/25/21 0306 09/26/21 0316  NA 140 137 137 141  K 4.7 4.5 4.3 4.2  CL 112* 110 109 114*  CO2 19* 19* 20* 21*  GLUCOSE 95 85 85 79  BUN 33* 32* 40* 28*  CREATININE 2.10* 2.12* 2.19* 1.99*  CALCIUM 9.1 8.7* 8.3* 8.0*     GFR: Estimated Creatinine Clearance: 32.9 mL/min (A) (by C-G formula based on SCr of 1.99 mg/dL (H)).  Liver Function Tests: Recent Labs  Lab 09/23/21 1740  AST 21  ALT 18   ALKPHOS 71  BILITOT 0.5  PROT 7.9  ALBUMIN 3.2*     CBG: No results for input(s): GLUCAP in the last 168 hours.   Recent Results (from the past 240 hour(s))  Urine Culture     Status: Abnormal   Collection Time: 09/23/21  2:25 PM   Specimen: Urine, Clean Catch  Result Value Ref Range Status   Specimen Description URINE, CLEAN CATCH  Final   Special Requests   Final    NONE Performed at [facility], [address], [place], [place] [address]    Culture >=100,000 COLONIES/mL ESCHERICHIA COLI (A)  Final   Report Status 09/25/2021 FINAL  Final   Organism ID, Bacteria ESCHERICHIA COLI (A)  Final      Susceptibility   Escherichia coli - MIC*    AMPICILLIN <=2 SENSITIVE Sensitive     CEFAZOLIN <=4 SENSITIVE Sensitive     CEFEPIME <=0.12 SENSITIVE Sensitive     CEFTRIAXONE <=0.25 SENSITIVE Sensitive     CIPROFLOXACIN <=0.25 SENSITIVE Sensitive     GENTAMICIN <=1 SENSITIVE Sensitive     IMIPENEM <=0.25 SENSITIVE Sensitive     NITROFURANTOIN 64 INTERMEDIATE Intermediate     TRIMETH/SULFA <=  20 SENSITIVE Sensitive     AMPICILLIN/SULBACTAM <=2 SENSITIVE Sensitive     PIP/TAZO <=4 SENSITIVE Sensitive     * >=100,000 COLONIES/mL ESCHERICHIA COLI  Resp Panel by RT-PCR (Flu A&B, Covid) Nasopharyngeal Swab     Status: None   Collection Time: 09/23/21  9:36 PM   Specimen: Nasopharyngeal Swab; Nasopharyngeal(NP) swabs in vial transport medium  Result Value Ref Range Status   SARS Coronavirus 2 by RT PCR NEGATIVE NEGATIVE Final    Comment: (NOTE) SARS-CoV-2 target nucleic acids are NOT DETECTED.  The SARS-CoV-2 RNA is generally detectable in upper respiratory specimens during the acute phase of infection. The lowest concentration of SARS-CoV-2 viral copies this assay can detect is 138 copies/mL. A negative result does not preclude SARS-Cov-2 infection and should not be used as the sole basis for treatment or other patient management decisions. A negative result may occur with   improper specimen collection/handling, submission of specimen other than nasopharyngeal swab, presence of viral mutation(s) within the areas targeted by this assay, and inadequate number of viral copies(<138 copies/mL). A negative result must be combined with clinical observations, patient history, and epidemiological information. The expected result is Negative.  Fact Sheet for Patients:  EntrepreneurPulse.com.au  Fact Sheet for Healthcare Providers:  IncredibleEmployment.be  This test is no t yet approved or cleared by the [place] FDA and  has been authorized for detection and/or diagnosis of SARS-CoV-2 by FDA under an Emergency Use Authorization (EUA). This EUA will remain  in effect (meaning this test can be used) for the duration of the COVID-19 declaration under Section 564(b)(1) of the Act, 21 U.S.C.section 360bbb-3(b)(1), unless the authorization is terminated  or revoked sooner.       Influenza A by PCR NEGATIVE NEGATIVE Final   Influenza B by PCR NEGATIVE NEGATIVE Final    Comment: (NOTE) The Xpert Xpress SARS-CoV-2/FLU/RSV plus assay is intended as an aid in the diagnosis of influenza from Nasopharyngeal swab specimens and should not be used as a sole basis for treatment. Nasal washings and aspirates are unacceptable for Xpert Xpress SARS-CoV-2/FLU/RSV testing.  Fact Sheet for Patients: EntrepreneurPulse.com.au  Fact Sheet for Healthcare Providers: IncredibleEmployment.be  This test is not yet approved or cleared by the [place] FDA and has been authorized for detection and/or diagnosis of SARS-CoV-2 by FDA under an Emergency Use Authorization (EUA). This EUA will remain in effect (meaning this test can be used) for the duration of the COVID-19 declaration under Section 564(b)(1) of the Act, 21 U.S.C. section 360bbb-3(b)(1), unless the authorization is terminated  or revoked.  Performed at [facility], [place] [address] [place], [place] [address]   Blood culture (routine x 2)     Status: None (Preliminary result)   Collection Time: 09/23/21  9:38 PM   Specimen: BLOOD LEFT ARM  Result Value Ref Range Status   Specimen Description BLOOD LEFT ARM  Final   Special Requests   Final    BOTTLES DRAWN AEROBIC AND ANAEROBIC Blood Culture adequate volume   Culture   Final    NO GROWTH 2 DAYS Performed at [facility], [place] [address] [place], [place] [address]    Report Status PENDING  Incomplete  Blood culture (routine x 2)     Status: None (Preliminary result)   Collection Time: 09/23/21  9:43 PM   Specimen: BLOOD RIGHT HAND  Result Value Ref Range Status   Specimen Description BLOOD RIGHT HAND  Final   Special Requests  Final    BOTTLES DRAWN AEROBIC AND ANAEROBIC Blood Culture results may not be optimal due to an inadequate volume of blood received in culture bottles   Culture   Final    NO GROWTH 2 DAYS Performed at [facility], [place] [address], [place], [place] [address]    Report Status PENDING  Incomplete          Radiology Studies: No results found.      Scheduled Meds:  allopurinol  100 mg Oral Daily   amLODipine  10 mg Oral Daily   carvedilol  25 mg Oral BID WC   Chlorhexidine Gluconate Cloth  6 each Topical Q0600   feeding supplement  237 mL Oral BID BM   levETIRAcetam  1,500 mg Oral QHS   pantoprazole  40 mg Oral Daily   sodium bicarbonate  650 mg Oral BID   Continuous Infusions:  sodium chloride 10 mL/hr at 09/24/21 0533   sodium chloride 100 mL/hr at 09/25/21 1553   cefTRIAXone (ROCEPHIN)  IV 2 g (09/25/21 2144)   sodium chloride irrigation       LOS: 3 days    Time spent: 35 minutes    Mitchell Seal, MD [facility]   To contact the attending provider between 7A-7P or the covering provider during after hours 7P-7A, please log into the web site [email or web address] and access using  universal [place] password for that web site. If you do not have the password, please call the hospital operator.  09/26/2021, 3:22 PM

## 2021-09-26 NOTE — Anesthesia Postprocedure Evaluation (Signed)
Anesthesia Post Note  Patient: Mitchell Comber Sr.  Procedure(s) Performed: TRANSURETHRAL RESECTION AND UNROOFING OF PROSTATIC ABSCESS (Prostate)     Patient location during evaluation: PACU Anesthesia Type: General Level of consciousness: awake and alert Pain management: pain level controlled Vital Signs Assessment: post-procedure vital signs reviewed and stable Respiratory status: spontaneous breathing, nonlabored ventilation, respiratory function stable and patient connected to nasal cannula oxygen Cardiovascular status: blood pressure returned to baseline and stable Postop Assessment: no apparent nausea or vomiting Anesthetic complications: no   No notable events documented.  Last Vitals:  Vitals:   09/26/21 0959 09/26/21 1319  BP: (!) 167/77 (!) 155/75  Pulse: 66 61  Resp: 16 15  Temp: 37.1 C 37 C  SpO2: 100% 96%    Last Pain:  Vitals:   09/26/21 1319  TempSrc: Oral  PainSc:                  Catalina Gravel

## 2021-09-26 NOTE — Progress Notes (Signed)
Day of Surgery Subjective: Patient reports feeling fine. He is ready to proceed with surgery.  Objective: Vital signs in last 24 hours: Temp:  [97.4 F (36.3 C)-98.8 F (37.1 C)] 98.8 F (37.1 C) (12/31 0435) Pulse Rate:  [72-80] 75 (12/31 0435) Resp:  [17-18] 17 (12/31 0435) BP: (147-159)/(74-86) 159/78 (12/31 0435) SpO2:  [96 %-100 %] 99 % (12/31 0435)  Intake/Output from previous day: 12/30 0701 - 12/31 0700 In: 1173.8 [I.V.:1098; IV Piggyback:75.8] Out: 1001 [Urine:1000; Stool:1] Intake/Output this shift: No intake/output data recorded.  Physical Exam:  Constitutional: Vital signs reviewed. WD WN in NAD   Eyes: PERRL, No scleral icterus.   Cardiovascular: RRR Pulmonary/Chest: Normal effort Extremities: No cyanosis or edema   Lab Results: Recent Labs    09/24/21 0137 09/25/21 0306 09/26/21 0316  HGB 10.8* 10.1* 9.7*  HCT 33.5* 30.8* 30.6*   BMET Recent Labs    09/25/21 0306 09/26/21 0316  NA 137 141  K 4.3 4.2  CL 109 114*  CO2 20* 21*  GLUCOSE 85 79  BUN 40* 28*  CREATININE 2.19* 1.99*  CALCIUM 8.3* 8.0*   No results for input(s): LABPT, INR in the last 72 hours. No results for input(s): LABURIN in the last 72 hours. Results for orders placed or performed during the hospital encounter of 09/23/21  Urine Culture     Status: Abnormal   Collection Time: 09/23/21  2:25 PM   Specimen: Urine, Clean Catch  Result Value Ref Range Status   Specimen Description URINE, CLEAN CATCH  Final   Special Requests   Final    NONE Performed at [facility], [place] [address], [place], [place] [address]    Culture >=100,000 COLONIES/mL ESCHERICHIA COLI (A)  Final   Report Status 09/25/2021 FINAL  Final   Organism ID, Bacteria ESCHERICHIA COLI (A)  Final      Susceptibility   Escherichia coli - MIC*    AMPICILLIN <=2 SENSITIVE Sensitive     CEFAZOLIN <=4 SENSITIVE Sensitive     CEFEPIME <=0.12 SENSITIVE Sensitive     CEFTRIAXONE <=0.25 SENSITIVE Sensitive      CIPROFLOXACIN <=0.25 SENSITIVE Sensitive     GENTAMICIN <=1 SENSITIVE Sensitive     IMIPENEM <=0.25 SENSITIVE Sensitive     NITROFURANTOIN 64 INTERMEDIATE Intermediate     TRIMETH/SULFA <=20 SENSITIVE Sensitive     AMPICILLIN/SULBACTAM <=2 SENSITIVE Sensitive     PIP/TAZO <=4 SENSITIVE Sensitive     * >=100,000 COLONIES/mL ESCHERICHIA COLI  Resp Panel by RT-PCR (Flu A&B, Covid) Nasopharyngeal Swab     Status: None   Collection Time: 09/23/21  9:36 PM   Specimen: Nasopharyngeal Swab; Nasopharyngeal(NP) swabs in vial transport medium  Result Value Ref Range Status   SARS Coronavirus 2 by RT PCR NEGATIVE NEGATIVE Final    Comment: (NOTE) SARS-CoV-2 target nucleic acids are NOT DETECTED.  The SARS-CoV-2 RNA is generally detectable in upper respiratory specimens during the acute phase of infection. The lowest concentration of SARS-CoV-2 viral copies this assay can detect is 138 copies/mL. A negative result does not preclude SARS-Cov-2 infection and should not be used as the sole basis for treatment or other patient management decisions. A negative result may occur with  improper specimen collection/handling, submission of specimen other than nasopharyngeal swab, presence of viral mutation(s) within the areas targeted by this assay, and inadequate number of viral copies(<138 copies/mL). A negative result must be combined with clinical observations, patient history, and epidemiological information. The expected result is Negative.  Fact Sheet for Patients:  EntrepreneurPulse.com.au  Fact Sheet for Healthcare Providers:  IncredibleEmployment.be  This test is no t yet approved or cleared by the [place] FDA and  has been authorized for detection and/or diagnosis of SARS-CoV-2 by FDA under an Emergency Use Authorization (EUA). This EUA will remain  in effect (meaning this test can be used) for the duration of the COVID-19 declaration under Section  564(b)(1) of the Act, 21 U.S.C.section 360bbb-3(b)(1), unless the authorization is terminated  or revoked sooner.       Influenza A by PCR NEGATIVE NEGATIVE Final   Influenza B by PCR NEGATIVE NEGATIVE Final    Comment: (NOTE) The Xpert Xpress SARS-CoV-2/FLU/RSV plus assay is intended as an aid in the diagnosis of influenza from Nasopharyngeal swab specimens and should not be used as a sole basis for treatment. Nasal washings and aspirates are unacceptable for Xpert Xpress SARS-CoV-2/FLU/RSV testing.  Fact Sheet for Patients: EntrepreneurPulse.com.au  Fact Sheet for Healthcare Providers: IncredibleEmployment.be  This test is not yet approved or cleared by the [place] FDA and has been authorized for detection and/or diagnosis of SARS-CoV-2 by FDA under an Emergency Use Authorization (EUA). This EUA will remain in effect (meaning this test can be used) for the duration of the COVID-19 declaration under Section 564(b)(1) of the Act, 21 U.S.C. section 360bbb-3(b)(1), unless the authorization is terminated or revoked.  Performed at [facility], [place] [address] [place], [place] [address]   Blood culture (routine x 2)     Status: None (Preliminary result)   Collection Time: 09/23/21  9:38 PM   Specimen: BLOOD LEFT ARM  Result Value Ref Range Status   Specimen Description BLOOD LEFT ARM  Final   Special Requests   Final    BOTTLES DRAWN AEROBIC AND ANAEROBIC Blood Culture adequate volume   Culture   Final    NO GROWTH 1 DAY Performed at [facility], [place] [address], [place], [place] [address]    Report Status PENDING  Incomplete  Blood culture (routine x 2)     Status: None (Preliminary result)   Collection Time: 09/23/21  9:43 PM   Specimen: BLOOD RIGHT HAND  Result Value Ref Range Status   Specimen Description BLOOD RIGHT HAND  Final   Special Requests   Final    BOTTLES DRAWN AEROBIC AND ANAEROBIC Blood Culture results  may not be optimal due to an inadequate volume of blood received in culture bottles   Culture   Final    NO GROWTH 1 DAY Performed at [facility], [place] [address], [place],  [address]    Report Status PENDING  Incomplete    Studies/Results: No results found.  Assessment/Plan:  Prostatic abscess--for TUR unroofing today.   LOS: 3 days   Lillette Boxer Onur Mori 09/26/2021, 8:02 AM

## 2021-09-26 NOTE — Anesthesia Preprocedure Evaluation (Addendum)
Anesthesia Evaluation  Patient identified by MRN, date of birth, ID band Patient awake    Reviewed: Allergy & Precautions, NPO status , Patient's Chart, lab work & pertinent test results, reviewed documented beta blocker date and time   Airway Mallampati: II  TM Distance: >3 FB Neck ROM: Full    Dental  (+) Dental Advisory Given, Edentulous Lower, Edentulous Upper   Pulmonary sleep apnea ,    Pulmonary exam normal breath sounds clear to auscultation       Cardiovascular hypertension, Pt. on medications and Pt. on home beta blockers + DVT  Normal cardiovascular exam Rhythm:Regular Rate:Normal  EF 35% on 2019 TTE   Neuro/Psych Seizures -, Well Controlled,  CVA, Residual Symptoms negative psych ROS   GI/Hepatic Neg liver ROS, GERD  Medicated and Controlled,  Endo/Other  negative endocrine ROS  Renal/GU Renal InsufficiencyRenal disease     Musculoskeletal negative musculoskeletal ROS (+)   Abdominal   Peds  Hematology  (+) Blood dyscrasia (Xarelto), anemia ,   Anesthesia Other Findings Day of surgery medications reviewed with the patient.  Reproductive/Obstetrics                            Anesthesia Physical Anesthesia Plan  ASA: 4  Anesthesia Plan: General   Post-op Pain Management: Toradol IV (intra-op)   Induction: Intravenous  PONV Risk Score and Plan: 3 and Dexamethasone, Ondansetron and Treatment may vary due to age or medical condition  Airway Management Planned: LMA  Additional Equipment:   Intra-op Plan:   Post-operative Plan: Extubation in OR  Informed Consent: I have reviewed the patients History and Physical, chart, labs and discussed the procedure including the risks, benefits and alternatives for the proposed anesthesia with the patient or authorized representative who has indicated his/her understanding and acceptance.     Dental advisory given  Plan Discussed  with: CRNA  Anesthesia Plan Comments:        Anesthesia Quick Evaluation

## 2021-09-26 NOTE — Anesthesia Procedure Notes (Signed)
Procedure Name: LMA Insertion Date/Time: 09/26/2021 8:30 AM Performed by: Gean Maidens, CRNA Pre-anesthesia Checklist: Patient identified, Emergency Drugs available, Suction available, Patient being monitored and Timeout performed Patient Re-evaluated:Patient Re-evaluated prior to induction Oxygen Delivery Method: Circle system utilized Preoxygenation: Pre-oxygenation with 100% oxygen Induction Type: IV induction Ventilation: Mask ventilation without difficulty LMA: LMA inserted LMA Size: 5.0 Number of attempts: 1 Placement Confirmation: positive ETCO2 and breath sounds checked- equal and bilateral Tube secured with: Tape Dental Injury: Teeth and Oropharynx as per pre-operative assessment

## 2021-09-26 NOTE — Op Note (Signed)
Preoperative diagnosis: Prostatic abscess  Postoperative diagnosis: Same  Principal procedure transurethral resection of prostate/unroofing of prostatic abscess  Surgeon: Enzley Kitchens  Anesthesia: General with LMA  Complications: None  Estimated blood loss: Less than 15 mL  Specimen: Prostate chips, to pathology  Drains: 24 French three-way hematuria catheter  Indications: 70 year old male with recently diagnosed prostatic abscess.  His history dates back a few weeks when he was treated for urinary tract infection.  Recent emergency room visit for worsening of his symptoms included CT abdomen and pelvis.  This revealed a probable prostatic abscess in his right prostate.  The patient has been on antiplatelet therapy, and has been off of this now for approximately 3 days.  He presents at this time for transurethral resection of his prostate/unroofing of his prostatic abscess.  Both Dr. [name] I have discussed the procedure with him, risks, complications and expected outcome.  He understands these and desires to proceed.  Findings: Urethra was normal.  Prostate was obstructive with bilobar hypertrophy.  There were cystic changes at the bladder neck consistent with inflammatory reaction.  There were no urothelial lesions of the bladder.  Ureteral orifices were somewhat difficult to identify due to inflammatory reaction.  Following resection of a portion of the right prostatic lobe, there was significant amount of pus extruding from the abscess.  There were no significant abnormalities of the abscess wall.  Description of procedure: The patient was properly identified in the holding area.  He has been on daily Rocephin.  He was taken to the operating room where general anesthetic was administered with the LMA.  He was placed in the dorsolithotomy position, genitalia and perineum were prepped, draped, proper timeout performed.  42 French resectoscope sheath was passed using the visual obturator.   Inspection was performed as discussed above.  The cutting loop was placed on the resectoscope and the right prostatic lobe was incised.  After several swipes of the loop, the abscess cavity was entered.  Copious amount of purulent matter was drained out and once this was completed I negotiated the beak of the scope and the abscess cavity and irrigated the remaining pus.  The proximal part of the abscess wall was creating somewhat of a flap valve effect at the bladder neck.  This was also resected.  I made an incision in the bladder neck down to the verumontanum as well.  Following this, careful hemostasis was achieved with the coag current.  Prostate chips were irrigated from the bladder and sent for permanent specimen.  With the irrigation off, inspection of the prostatic fossa was carried out and no further bleeding was seen.  At this point the, the scope was removed.  24 French three-way Foley catheter was placed.  Balloon filled with 30 cc of water, catheter was hooked to traction and CBI.  The patient was awakened, taken to the PACU in stable condition, having tolerated the procedure well.

## 2021-09-27 ENCOUNTER — Encounter (HOSPITAL_COMMUNITY): Payer: Self-pay | Admitting: Urology

## 2021-09-27 LAB — BASIC METABOLIC PANEL
Anion gap: 7 (ref 5–15)
BUN: 27 mg/dL — ABNORMAL HIGH (ref 8–23)
CO2: 20 mmol/L — ABNORMAL LOW (ref 22–32)
Calcium: 8.3 mg/dL — ABNORMAL LOW (ref 8.9–10.3)
Chloride: 114 mmol/L — ABNORMAL HIGH (ref 98–111)
Creatinine, Ser: 1.94 mg/dL — ABNORMAL HIGH (ref 0.61–1.24)
GFR, Estimated: 36 mL/min — ABNORMAL LOW (ref >60)
Glucose, Bld: 98 mg/dL (ref 70–99)
Potassium: 4.4 mmol/L (ref 3.5–5.1)
Sodium: 141 mmol/L (ref 135–145)

## 2021-09-27 LAB — CBC WITH DIFFERENTIAL/PLATELET
Abs Immature Granulocytes: 0.08 10*3/uL — ABNORMAL HIGH (ref 0.00–0.07)
Basophils Absolute: 0 10*3/uL (ref 0.0–0.1)
Basophils Relative: 0 %
Eosinophils Absolute: 0 10*3/uL (ref 0.0–0.5)
Eosinophils Relative: 0 %
HCT: 32.1 % — ABNORMAL LOW (ref 39.0–52.0)
Hemoglobin: 10.2 g/dL — ABNORMAL LOW (ref 13.0–17.0)
Immature Granulocytes: 1 %
Lymphocytes Relative: 6 %
Lymphs Abs: 0.7 10*3/uL (ref 0.7–4.0)
MCH: 27.4 pg (ref 26.0–34.0)
MCHC: 31.8 g/dL (ref 30.0–36.0)
MCV: 86.3 fL (ref 80.0–100.0)
Monocytes Absolute: 0.8 10*3/uL (ref 0.1–1.0)
Monocytes Relative: 6 %
Neutro Abs: 10.8 10*3/uL — ABNORMAL HIGH (ref 1.7–7.7)
Neutrophils Relative %: 87 %
Platelets: 228 10*3/uL (ref 150–400)
RBC: 3.72 MIL/uL — ABNORMAL LOW (ref 4.22–5.81)
RDW: 14.6 % (ref 11.5–15.5)
WBC: 12.4 10*3/uL — ABNORMAL HIGH (ref 4.0–10.5)
nRBC: 0 % (ref 0.0–0.2)

## 2021-09-27 MED ORDER — RIVAROXABAN 20 MG PO TABS: 20.0000 mg | ORAL_TABLET | Freq: Every day | ORAL | 0 refills | Status: DC

## 2021-09-27 MED ORDER — CIPROFLOXACIN HCL 500 MG PO TABS: 500.0000 mg | ORAL_TABLET | Freq: Two times a day (BID) | ORAL | 0 refills | Status: AC

## 2021-09-27 MED ORDER — MAGNESIUM OXIDE -MG SUPPLEMENT 400 (240 MG) MG PO TABS
400.0000 mg | ORAL_TABLET | Freq: Every day | ORAL | Status: DC
  Administered 2021-09-27: 400 mg via ORAL
  Filled 2021-09-27: qty 1

## 2021-09-27 MED ORDER — SODIUM CHLORIDE 0.45 % IV SOLN: INTRAVENOUS | Status: DC

## 2021-09-27 MED ORDER — MAGNESIUM OXIDE 400 MG PO TABS
400.0000 mg | ORAL_TABLET | Freq: Every day | ORAL | Status: DC
  Filled 2021-09-27: qty 1

## 2021-09-27 MED ORDER — THIAMINE HCL 100 MG PO TABS
100.0000 mg | ORAL_TABLET | Freq: Every day | ORAL | Status: DC
  Administered 2021-09-27: 100 mg via ORAL
  Filled 2021-09-27: qty 1

## 2021-09-27 MED ORDER — SODIUM BICARBONATE 650 MG PO TABS: 650.0000 mg | ORAL_TABLET | Freq: Two times a day (BID) | ORAL | 0 refills | Status: AC

## 2021-09-27 MED ORDER — OXYCODONE HCL 5 MG PO TABS: 5.0000 mg | ORAL_TABLET | Freq: Four times a day (QID) | ORAL | 0 refills | Status: DC | PRN

## 2021-09-27 MED ORDER — PROSIGHT PO TABS
1.0000 | ORAL_TABLET | Freq: Every day | ORAL | Status: DC
  Administered 2021-09-27: 1 via ORAL
  Filled 2021-09-27: qty 1

## 2021-09-27 MED ORDER — OYSTER SHELL CALCIUM/D3 500-5 MG-MCG PO TABS
ORAL_TABLET | Freq: Two times a day (BID) | ORAL | Status: DC
  Administered 2021-09-27: 1 via ORAL
  Filled 2021-09-27: qty 1

## 2021-09-27 MED ORDER — FUROSEMIDE 20 MG PO TABS: 20.0000 mg | ORAL_TABLET | Freq: Every day | ORAL | 0 refills | Status: DC

## 2021-09-27 MED ORDER — FERROUS SULFATE 325 (65 FE) MG PO TABS
325.0000 mg | ORAL_TABLET | Freq: Every day | ORAL | Status: DC
  Administered 2021-09-27: 325 mg via ORAL
  Filled 2021-09-27: qty 1

## 2021-09-27 MED ORDER — FOLIC ACID 1 MG PO TABS
1.0000 mg | ORAL_TABLET | Freq: Every day | ORAL | Status: DC
  Administered 2021-09-27: 1 mg via ORAL
  Filled 2021-09-27: qty 1

## 2021-09-27 NOTE — Progress Notes (Signed)
Urology Inpatient Progress Report  Prostatic abscess [N41.2] Prostate abscess [N41.2] Acute cystitis with hematuria [N30.01]  Procedure(s): TRANSURETHRAL RESECTION AND UNROOFING OF PROSTATIC ABSCESS  1 Day Post-Op   Intv/Subj: No acute events overnight. Patient is without complaint. Urine is clear off CBI.  Principal Problem:   Prostate abscess Active Problems:   Chronic combined systolic and diastolic CHF (congestive heart failure) (HCC)   History of stroke   Seizure disorder (HCC)   CKD (chronic kidney disease) stage 3, GFR 30-59 ml/min (HCC)   Kidney lesion, native, bilateral  Current Facility-Administered Medications  Medication Dose Route Frequency Provider Last Rate Last Admin   0.45 % sodium chloride infusion   Intravenous Continuous Eugenie Filler, MD       0.9 %  sodium chloride infusion   Intravenous PRN Franchot Gallo, MD 10 mL/hr at 09/24/21 0533 New Bag at 09/24/21 0533   acetaminophen (TYLENOL) tablet 650 mg  650 mg Oral Q6H PRN Franchot Gallo, MD   650 mg at 09/27/21 9381   Or   acetaminophen (TYLENOL) suppository 650 mg  650 mg Rectal Q6H PRN Franchot Gallo, MD       allopurinol (ZYLOPRIM) tablet 100 mg  100 mg Oral Daily Franchot Gallo, MD   100 mg at 09/27/21 0905   amLODipine (NORVASC) tablet 10 mg  10 mg Oral Daily Franchot Gallo, MD   10 mg at 09/27/21 0175   calcium-vitamin D (OSCAL WITH D) 500-5 MG-MCG per tablet   Oral BID Eugenie Filler, MD   1 tablet at 09/27/21 0906   carvedilol (COREG) tablet 25 mg  25 mg Oral BID WC Franchot Gallo, MD   25 mg at 09/27/21 0905   cefTRIAXone (ROCEPHIN) 2 g in sodium chloride 0.9 % 100 mL IVPB  2 g Intravenous Q24H Franchot Gallo, MD 200 mL/hr at 09/26/21 2114 2 g at 09/26/21 2114   Chlorhexidine Gluconate Cloth 2 % PADS 6 each  6 each Topical Q0600 Eugenie Filler, MD   6 each at 09/27/21 0507   feeding supplement (ENSURE ENLIVE / ENSURE PLUS) liquid 237 mL  237 mL Oral BID BM  Franchot Gallo, MD   237 mL at 09/24/21 1352   ferrous sulfate tablet 325 mg  325 mg Oral Q breakfast Eugenie Filler, MD   325 mg at 07/21/84 2778   folic acid (FOLVITE) tablet 1 mg  1 mg Oral Daily Eugenie Filler, MD   1 mg at 09/27/21 2423   hydrALAZINE (APRESOLINE) injection 10 mg  10 mg Intravenous Q6H PRN Franchot Gallo, MD       levETIRAcetam (KEPPRA XR) 24 hr tablet 1,500 mg  1,500 mg Oral QHS Franchot Gallo, MD   1,500 mg at 09/26/21 2111   magnesium oxide (MAG-OX) tablet 400 mg  400 mg Oral Daily Eugenie Filler, MD   400 mg at 09/27/21 5361   multivitamin (PROSIGHT) tablet 1 tablet  1 tablet Oral Daily Eugenie Filler, MD   1 tablet at 09/27/21 4431   oxyCODONE (Oxy IR/ROXICODONE) immediate release tablet 5 mg  5 mg Oral Q6H PRN Franchot Gallo, MD   5 mg at 09/27/21 0905   pantoprazole (PROTONIX) EC tablet 40 mg  40 mg Oral Daily Franchot Gallo, MD   40 mg at 09/27/21 5400   senna-docusate (Senokot-S) tablet 1 tablet  1 tablet Oral QHS PRN Franchot Gallo, MD   1 tablet at 09/25/21 0851   sodium bicarbonate tablet 650 mg  650 mg  Oral BID Franchot Gallo, MD   650 mg at 09/27/21 1224   sodium chloride irrigation 0.9 % 3,000 mL  3,000 mL Irrigation Continuous Franchot Gallo, MD   3,000 mL at 09/26/21 8250   thiamine tablet 100 mg  100 mg Oral Daily Eugenie Filler, MD   100 mg at 09/27/21 0905     Objective: Vital: Vitals:   09/26/21 1319 09/26/21 2256 09/27/21 0249 09/27/21 0456  BP: (!) 155/75 (!) 157/84  (!) 150/71  Pulse: 61 84  (!) 59  Resp: 15 16  16   Temp: 98.6 F (37 C) 98.2 F (36.8 C)  98.2 F (36.8 C)  TempSrc: Oral Oral  Oral  SpO2: 96% 100%  99%  Weight:   80.4 kg   Height:       I/Os: I/O last 3 completed shifts: In: 8560.4 [P.O.:720; I.V.:3890.4; Other:3650; IV Piggyback:300] Out: 7920 [Urine:7920]  Physical Exam:  General: Patient is in no apparent distress Lungs: Normal respiratory effort, chest expands  symmetrically. GI:  The abdomen is soft and nontender without mass. Foley: Three-way Foley catheter in place draining clear yellow urine Ext: lower extremities symmetric  Lab Results: Recent Labs    09/25/21 0306 09/26/21 0316 09/27/21 0301  WBC 9.6 8.3 12.4*  HGB 10.1* 9.7* 10.2*  HCT 30.8* 30.6* 32.1*   Recent Labs    09/25/21 0306 09/26/21 0316 09/27/21 0301  NA 137 141 141  K 4.3 4.2 4.4  CL 109 114* 114*  CO2 20* 21* 20*  GLUCOSE 85 79 98  BUN 40* 28* 27*  CREATININE 2.19* 1.99* 1.94*  CALCIUM 8.3* 8.0* 8.3*   No results for input(s): LABPT, INR in the last 72 hours. No results for input(s): LABURIN in the last 72 hours. Results for orders placed or performed during the hospital encounter of 09/23/21  Urine Culture     Status: Abnormal   Collection Time: 09/23/21  2:25 PM   Specimen: Urine, Clean Catch  Result Value Ref Range Status   Specimen Description URINE, CLEAN CATCH  Final   Special Requests   Final    NONE Performed at [facility], [place] [address] [place], [place] [address]    Culture >=100,000 COLONIES/mL ESCHERICHIA COLI (A)  Final   Report Status 09/25/2021 FINAL  Final   Organism ID, Bacteria ESCHERICHIA COLI (A)  Final      Susceptibility   Escherichia coli - MIC*    AMPICILLIN <=2 SENSITIVE Sensitive     CEFAZOLIN <=4 SENSITIVE Sensitive     CEFEPIME <=0.12 SENSITIVE Sensitive     CEFTRIAXONE <=0.25 SENSITIVE Sensitive     CIPROFLOXACIN <=0.25 SENSITIVE Sensitive     GENTAMICIN <=1 SENSITIVE Sensitive     IMIPENEM <=0.25 SENSITIVE Sensitive     NITROFURANTOIN 64 INTERMEDIATE Intermediate     TRIMETH/SULFA <=20 SENSITIVE Sensitive     AMPICILLIN/SULBACTAM <=2 SENSITIVE Sensitive     PIP/TAZO <=4 SENSITIVE Sensitive     * >=100,000 COLONIES/mL ESCHERICHIA COLI  Resp Panel by RT-PCR (Flu A&B, Covid) Nasopharyngeal Swab     Status: None   Collection Time: 09/23/21  9:36 PM   Specimen: Nasopharyngeal Swab; Nasopharyngeal(NP) swabs in  vial transport medium  Result Value Ref Range Status   SARS Coronavirus 2 by RT PCR NEGATIVE NEGATIVE Final    Comment: (NOTE) SARS-CoV-2 target nucleic acids are NOT DETECTED.  The SARS-CoV-2 RNA is generally detectable in upper respiratory specimens during the acute phase of infection. The lowest concentration of  SARS-CoV-2 viral copies this assay can detect is 138 copies/mL. A negative result does not preclude SARS-Cov-2 infection and should not be used as the sole basis for treatment or other patient management decisions. A negative result may occur with  improper specimen collection/handling, submission of specimen other than nasopharyngeal swab, presence of viral mutation(s) within the areas targeted by this assay, and inadequate number of viral copies(<138 copies/mL). A negative result must be combined with clinical observations, patient history, and epidemiological information. The expected result is Negative.  Fact Sheet for Patients:  EntrepreneurPulse.com.au  Fact Sheet for Healthcare Providers:  IncredibleEmployment.be  This test is no t yet approved or cleared by the [place] FDA and  has been authorized for detection and/or diagnosis of SARS-CoV-2 by FDA under an Emergency Use Authorization (EUA). This EUA will remain  in effect (meaning this test can be used) for the duration of the COVID-19 declaration under Section 564(b)(1) of the Act, 21 U.S.C.section 360bbb-3(b)(1), unless the authorization is terminated  or revoked sooner.       Influenza A by PCR NEGATIVE NEGATIVE Final   Influenza B by PCR NEGATIVE NEGATIVE Final    Comment: (NOTE) The Xpert Xpress SARS-CoV-2/FLU/RSV plus assay is intended as an aid in the diagnosis of influenza from Nasopharyngeal swab specimens and should not be used as a sole basis for treatment. Nasal washings and aspirates are unacceptable for Xpert Xpress SARS-CoV-2/FLU/RSV testing.  Fact  Sheet for Patients: EntrepreneurPulse.com.au  Fact Sheet for Healthcare Providers: IncredibleEmployment.be  This test is not yet approved or cleared by the [place] FDA and has been authorized for detection and/or diagnosis of SARS-CoV-2 by FDA under an Emergency Use Authorization (EUA). This EUA will remain in effect (meaning this test can be used) for the duration of the COVID-19 declaration under Section 564(b)(1) of the Act, 21 U.S.C. section 360bbb-3(b)(1), unless the authorization is terminated or revoked.  Performed at [facility], [place] [address], [place], [place] [address]   Blood culture (routine x 2)     Status: None (Preliminary result)   Collection Time: 09/23/21  9:38 PM   Specimen: BLOOD LEFT ARM  Result Value Ref Range Status   Specimen Description BLOOD LEFT ARM  Final   Special Requests   Final    BOTTLES DRAWN AEROBIC AND ANAEROBIC Blood Culture adequate volume   Culture   Final    NO GROWTH 2 DAYS Performed at [facility], [place] [address] [place], [place] [address]    Report Status PENDING  Incomplete  Blood culture (routine x 2)     Status: None (Preliminary result)   Collection Time: 09/23/21  9:43 PM   Specimen: BLOOD RIGHT HAND  Result Value Ref Range Status   Specimen Description BLOOD RIGHT HAND  Final   Special Requests   Final    BOTTLES DRAWN AEROBIC AND ANAEROBIC Blood Culture results may not be optimal due to an inadequate volume of blood received in culture bottles   Culture   Final    NO GROWTH 2 DAYS Performed at [facility], [place] [address] [place], [place] [address]    Report Status PENDING  Incomplete    Studies/Results: No results found.  Assessment: Prostate abscess Urinary tract infection  Procedure(s): TRANSURETHRAL RESECTION AND UNROOFING OF PROSTATIC ABSCESS, 1 Day Post-Op  doing well.  Plan: Continue Foley catheter for 1 week.  He will follow-up in clinic for  voiding trial.  Recommend continuing antibiotic such as  ciprofloxacin, Bactrim, or Augmentin.  He grew E. coli pansensitive except for [employer].  Continue this for another 10 days.  Okay to restart anticoagulation today or tomorrow.  Okay for discharge from a urological standpoint.   Link Snuffer, MD Urology 09/27/2021, 12:15 PM

## 2021-09-27 NOTE — Discharge Summary (Signed)
Physician Discharge Summary  Mitchell [name] Sr. TGG:[id number] DOB: 08-09-1951 DOA: 09/23/2021  PCP: [name], MD  Admit date: 09/23/2021 Discharge date: 09/27/2021  Time spent: 55 minutes  Recommendations for Outpatient Follow-up:  Follow-up with Dr. [name], urology 1 week post discharge.  On follow-up patient will need a basic metabolic profile done to follow-up on electrolytes and renal function. Follow-up with [name], MD in 2 weeks.  On follow-up patient will need a basic metabolic profile done to follow-up on electrolytes and renal function.  Patient will need a CBC done to follow-up on H&H. Patient will need outpatient nonemergent multiphase renal CT or MRI to follow-up on bilateral indeterminate renal hypodensities/lesions.   Discharge Diagnoses:  Principal Problem:   Prostate abscess Active Problems:   Chronic combined systolic and diastolic CHF (congestive heart failure) (HCC)   History of stroke   Seizure disorder (HCC)   CKD (chronic kidney disease) stage 3, GFR 30-59 ml/min (HCC)   Kidney lesion, native, bilateral   Discharge Condition: Stable and improved  Diet recommendation: Heart healthy  Filed Weights   09/24/21 0222 09/25/21 0500 09/27/21 0249  Weight: 76.3 kg 78.6 kg 80.4 kg    History of present illness:  HPI per Dr. [name] is a pleasant 71 y.o. male with medical history significant for DVT on Xarelto, CVA with residual left-sided motor deficits, chronic combined systolic and diastolic CHF, CKD stage III, and seizures, now presenting to the emergency department for evaluation of dysuria, suprapubic pain, and gross hematuria.  Patient reports that he completed a course of antibiotics (he believes it was cephalexin) in November, had resolution of urinary symptoms then, but then developed recurrent dysuria in addition to the suprapubic pain 2 days ago.  He also noted a small amount of blood in his briefs.  He denies subjective fevers or flank  pain.   ED Course: Upon arrival to the ED, patient is found to be afebrile, saturating well on room air, and with stable blood pressure.  Chemistry panel notable for creatinine 2.10.  CBC with mild normocytic anemia.  Lactic acid normal.  Urinalysis is nitrite positive with many bacteria and >50 WBC/hpf.  CT of the abdomen and pelvis demonstrates heterogenous enlarged prostate with suspected prostatic abscess, marked perivesicular fat stranding, and bilateral indeterminate renal hypodensities.  Urology was consulted by the ED physician, blood cultures and urine cultures were ordered, Zosyn was started, and Tylenol given.  Hospital Course:  #1 E. coli UTI/prostatic abscess -Patient presented with dysuria, suprapubic abdominal pain, hematuria, pyuria. -Urinalysis consistent with UTI. -CT abdomen and pelvis concerning for suspected prostatic abscess. -Urine cultures with > 100,000 colonies of E. coli with intermediate sensitivity to nitrofurantoin however pansensitive. -Was on IV Zosyn and narrowed to IV Rocephin.   -Patient seen in consultation by urology underwent cystoscopy and transurethral unroofing of prostatic abscess 09/26/2021 without any complications. -Xarelto was held and will be resumed 1 day post discharge. -Patient be discharged home on 11 more days of oral ciprofloxacin to complete a 14-day course of treatment. -Patient was discharged home with a Foley catheter with outpatient follow-up and voiding trial 1 week post discharge with urology. -Patient was discharged in stable and improved condition.. -Outpatient follow-up with urology.  2.  History of combined chronic systolic/diastolic CHF -EF noted to be 30 to 35% per 2D echo 2019. -Patient diuretics held during the hospitalization patient maintained on home regimen Coreg.   -Patient remained euvolemic during hospitalization.   -Patient's Lasix will be resumed  3 days post discharge.   -Outpatient follow-up.   3.  Seizure  disorder -Stable. -Patient maintained on home regimen Keppra.  4.  Hypertension -Patient maintained on home regimen Norvasc and Coreg.   -Lasix held and will be resumed 3 days post discharge.   5.  AKI on CKD stage IIIb/none anion gap metabolic acidosis -Creatinine noted to be 1.6 in 2019. -On presentation creatinine in the twos and slowly trended down with hydration. -Patient's diuretics of Lasix were held and patient gently hydrated during the hospitalization.  -Patient maintained on bicarb tablets.  -Creatinine was down to 1.94 by day of discharge.  -Outpatient follow-up with PCP.    6.  History of CVA -Patient with left residual weakness. -Uses a cane at baseline for walking. -Was on Xarelto prior to admission which was held for urological procedure.   -Xarelto will be resumed 1 day post discharge.   -Outpatient follow-up.   7.  History of DVT -On Xarelto prior to admission which was held during the hospitalization as patient underwent urological procedure.   -Patient with no significant bleeding noted postoperatively.   -Patient will resume Xarelto tomorrow 09/28/2021 per urology recommendations.   -Outpatient follow-up.    8.  Kidney lesions -Indeterminate hypodensities noted bilaterally on CT scan on presentation. -Outpatient imaging/follow-up recommended.    Procedures: CT renal stone protocol 09/23/2021 Transurethral resection of prostate/unroofing of prostatic abscess per Dr. [name] 09/26/2021    Consultations: Urology: Dr. [name] 09/24/2021    Discharge Exam: Vitals:   09/27/21 0456 09/27/21 1353  BP: (!) 150/71 136/66  Pulse: (!) 59 (!) 59  Resp: 16 20  Temp: 98.2 F (36.8 C) 98.1 F (36.7 C)  SpO2: 99% 97%    General: NAD Cardiovascular: Regular rate rhythm no murmurs rubs or gallops.  No JVD.  No lower extremity edema. Respiratory: Lungs clear to auscultation bilaterally.  No wheezes, no crackles, no rhonchi.  Normal respiratory effort.   Speaking in full sentences.  Discharge Instructions   Discharge Instructions     Diet - low sodium heart healthy   Complete by: As directed    Increase activity slowly   Complete by: As directed       Allergies as of 09/27/2021       Reactions   Hydrochlorothiazide    REACTION: precipitates gout   Pseudoephedrine    REACTION: rash        Medication List     TAKE these medications    acetaminophen 325 MG tablet Commonly known as: TYLENOL Take 2 tablets (650 mg total) by mouth every 6 (six) hours as needed for mild pain, moderate pain or fever (or Fever >/= 101).   allopurinol 100 MG tablet Commonly known as: ZYLOPRIM Take 100 mg by mouth daily.   amLODipine 10 MG tablet Commonly known as: NORVASC Take 1 tablet (10 mg total) by mouth daily.   calcium-vitamin D 500-200 MG-UNIT tablet Commonly known as: OSCAL WITH D Take 1 tablet by mouth 2 (two) times daily.   carvedilol 25 MG tablet Commonly known as: COREG Take 1 tablet (25 mg total) by mouth 2 (two) times daily with a meal.   ciprofloxacin 500 MG tablet Commonly known as: CIPRO Take 1 tablet (500 mg total) by mouth 2 (two) times daily for 11 days.   ferrous sulfate 325 (65 FE) MG tablet Take 1 tablet (325 mg total) by mouth daily with breakfast.   folic acid 1 MG tablet Commonly known as: FOLVITE Take 1 tablet (  1 mg total) by mouth daily.   furosemide 20 MG tablet Commonly known as: LASIX Take 1 tablet (20 mg total) by mouth daily. Start taking on: September 30, 2021 What changed: These instructions start on September 30, 2021. If you are unsure what to do until then, ask your doctor or other care provider.   Levetiracetam 750 MG Tb24 Commonly known as: Keppra XR Take 2 tablets (1,500 mg total) by mouth at bedtime.   magnesium oxide 400 MG tablet Commonly known as: MAG-OX Take 1 tablet (400 mg total) by mouth daily.   multivitamin Tabs tablet Take 1 tablet by mouth daily.   oxyCODONE 5 MG  immediate release tablet Commonly known as: Oxy IR/ROXICODONE Take 1 tablet (5 mg total) by mouth every 6 (six) hours as needed for moderate pain or severe pain.   pantoprazole 40 MG tablet Commonly known as: PROTONIX Take 1 tablet (40 mg total) by mouth daily.   rivaroxaban 20 MG Tabs tablet Commonly known as: XARELTO Take 1 tablet (20 mg total) by mouth daily with supper. Start taking on: September 28, 2021   sodium bicarbonate 650 MG tablet Take 1 tablet (650 mg total) by mouth 2 (two) times daily for 7 days.   thiamine 100 MG tablet Take 1 tablet (100 mg total) by mouth daily.   traMADol 50 MG tablet Commonly known as: ULTRAM Take 1 tablet (50 mg total) by mouth every 6 (six) hours as needed. What changed: reasons to take this       Allergies  Allergen Reactions   Hydrochlorothiazide     REACTION: precipitates gout   Pseudoephedrine     REACTION: rash    Follow-up Information     [name], MD. Schedule an appointment as soon as possible for a visit in 2 week(s).   Specialty: Family Medicine Contact information: [address] [place] [address] [phone]         Franchot Gallo, MD. Schedule an appointment as soon as possible for a visit in 1 week(s).   Specialty: Urology Contact information: [place] [address] [phone]                  The results of significant diagnostics from this hospitalization (including imaging, microbiology, ancillary and laboratory) are listed below for reference.    Significant Diagnostic Studies: CT Renal Stone Study  Result Date: 09/23/2021 CLINICAL DATA:  Hematuria, suprapubic pain, urinary tract infection EXAM: CT ABDOMEN AND PELVIS WITHOUT CONTRAST TECHNIQUE: Multidetector CT imaging of the abdomen and pelvis was performed following the standard protocol without IV contrast. Unenhanced CT was performed per clinician order. Lack of IV contrast limits sensitivity and  specificity, especially for evaluation of abdominal/pelvic solid viscera. COMPARISON:  03/08/2017, 05/08/2021 FINDINGS: Lower chest: No acute pleural or parenchymal lung disease. Small hiatal hernia. Hepatobiliary: Unremarkable unenhanced appearance of the liver and gallbladder. Pancreas: Unremarkable unenhanced appearance. Spleen: Unremarkable unenhanced appearance. Adrenals/Urinary Tract: Malrotation of the bilateral kidneys. There are multiple bilateral simple and complex renal cysts. Complex 4.5 cm cyst upper pole right kidney measures 39 Hounsfield units, similar to prior ultrasound exam. 1.2 cm indeterminate lesion within the posterior aspect lower pole left kidney has Hounsfield attenuation of 28. No urinary tract calculi or obstructive uropathy within either kidney. Adrenals are unremarkable. The bladder is decompressed, which limits evaluation. Marked perivesicular fat stranding concerning for cystitis. Stomach/Bowel: No bowel obstruction or ileus. Normal appendix right lower quadrant. No bowel wall thickening. Vascular/Lymphatic: Moderate  atherosclerosis of the aorta. No pathologic adenopathy within the abdomen or pelvis. Reproductive: The prostate is enlarged measuring 5.2 x 6.2 by 6.3 cm. A 3.4 x 3.8 x 2.7 cm hypodense region within the superior right central aspect of the prostate is concerning for underlying prostatic abscess given clinical history of recurrent urinary tract infection. Evaluation of the prostate parenchyma is limited without IV contrast. The seminal vesicles appear unremarkable. Other: No free fluid or free intraperitoneal gas. There is a small fat containing umbilical hernia as well as a fat containing right inguinal hernia. No bowel herniation. Musculoskeletal: No acute or destructive bony lesions. Reconstructed images demonstrate no additional findings. IMPRESSION: 1. Heterogeneous enlarged prostate, with evidence of suspected prostatic abscess. 2. Marked perivesicular fat stranding  concerning for cystitis. Bladder is decompressed, which limits evaluation. 3. Bilateral indeterminate renal hypodensities, the largest of which corresponded to a complex cyst on prior ultrasound. Definitive characterization with nonemergent outpatient multiphase renal CT or MRI is recommended. 4. Small hiatal hernia, fat containing umbilical hernia, and fat containing right inguinal hernia as above. 5.  Aortic Atherosclerosis (ICD10-I70.0). Electronically Signed   By: Randa Ngo M.D.   On: 09/23/2021 20:08    Microbiology: Recent Results (from the past 240 hour(s))  Urine Culture     Status: Abnormal   Collection Time: 09/23/21  2:25 PM   Specimen: Urine, Clean Catch  Result Value Ref Range Status   Specimen Description URINE, CLEAN CATCH  Final   Special Requests   Final    NONE Performed at [facility], [place] [address], [place], [place] [address]    Culture >=100,000 COLONIES/mL ESCHERICHIA COLI (A)  Final   Report Status 09/25/2021 FINAL  Final   Organism ID, Bacteria ESCHERICHIA COLI (A)  Final      Susceptibility   Escherichia coli - MIC*    AMPICILLIN <=2 SENSITIVE Sensitive     CEFAZOLIN <=4 SENSITIVE Sensitive     CEFEPIME <=0.12 SENSITIVE Sensitive     CEFTRIAXONE <=0.25 SENSITIVE Sensitive     CIPROFLOXACIN <=0.25 SENSITIVE Sensitive     GENTAMICIN <=1 SENSITIVE Sensitive     IMIPENEM <=0.25 SENSITIVE Sensitive     NITROFURANTOIN 64 INTERMEDIATE Intermediate     TRIMETH/SULFA <=20 SENSITIVE Sensitive     AMPICILLIN/SULBACTAM <=2 SENSITIVE Sensitive     PIP/TAZO <=4 SENSITIVE Sensitive     * >=100,000 COLONIES/mL ESCHERICHIA COLI  Resp Panel by RT-PCR (Flu A&B, Covid) Nasopharyngeal Swab     Status: None   Collection Time: 09/23/21  9:36 PM   Specimen: Nasopharyngeal Swab; Nasopharyngeal(NP) swabs in vial transport medium  Result Value Ref Range Status   SARS Coronavirus 2 by RT PCR NEGATIVE NEGATIVE Final    Comment: (NOTE) SARS-CoV-2 target nucleic acids are  NOT DETECTED.  The SARS-CoV-2 RNA is generally detectable in upper respiratory specimens during the acute phase of infection. The lowest concentration of SARS-CoV-2 viral copies this assay can detect is 138 copies/mL. A negative result does not preclude SARS-Cov-2 infection and should not be used as the sole basis for treatment or other patient management decisions. A negative result may occur with  improper specimen collection/handling, submission of specimen other than nasopharyngeal swab, presence of viral mutation(s) within the areas targeted by this assay, and inadequate number of viral copies(<138 copies/mL). A negative result must be combined with clinical observations, patient history, and epidemiological information. The expected result is Negative.  Fact Sheet for Patients:  EntrepreneurPulse.com.au  Fact Sheet for Healthcare Providers:  IncredibleEmployment.be  This test is no t yet approved or cleared by the [place] and  has been authorized for detection and/or diagnosis of SARS-CoV-2 by FDA under an Emergency Use Authorization (EUA). This EUA will remain  in effect (meaning this test can be used) for the duration of the COVID-19 declaration under Section 564(b)(1) of the Act, 21 U.S.C.section 360bbb-3(b)(1), unless the authorization is terminated  or revoked sooner.       Influenza A by PCR NEGATIVE NEGATIVE Final   Influenza B by PCR NEGATIVE NEGATIVE Final    Comment: (NOTE) The Xpert Xpress SARS-CoV-2/FLU/RSV plus assay is intended as an aid in the diagnosis of influenza from Nasopharyngeal swab specimens and should not be used as a sole basis for treatment. Nasal washings and aspirates are unacceptable for Xpert Xpress SARS-CoV-2/FLU/RSV testing.  Fact Sheet for Patients: EntrepreneurPulse.com.au  Fact Sheet for Healthcare Providers: IncredibleEmployment.be  This test is not  yet approved or cleared by the [place] FDA and has been authorized for detection and/or diagnosis of SARS-CoV-2 by FDA under an Emergency Use Authorization (EUA). This EUA will remain in effect (meaning this test can be used) for the duration of the COVID-19 declaration under Section 564(b)(1) of the Act, 21 U.S.C. section 360bbb-3(b)(1), unless the authorization is terminated or revoked.  Performed at [facility], [place] [address] [place], [place] [address]   Blood culture (routine x 2)     Status: None (Preliminary result)   Collection Time: 09/23/21  9:38 PM   Specimen: BLOOD LEFT ARM  Result Value Ref Range Status   Specimen Description BLOOD LEFT ARM  Final   Special Requests   Final    BOTTLES DRAWN AEROBIC AND ANAEROBIC Blood Culture adequate volume   Culture   Final    NO GROWTH 3 DAYS Performed at [facility], [place] [address], [place], [place] [address]    Report Status PENDING  Incomplete  Blood culture (routine x 2)     Status: None (Preliminary result)   Collection Time: 09/23/21  9:43 PM   Specimen: BLOOD RIGHT HAND  Result Value Ref Range Status   Specimen Description BLOOD RIGHT HAND  Final   Special Requests   Final    BOTTLES DRAWN AEROBIC AND ANAEROBIC Blood Culture results may not be optimal due to an inadequate volume of blood received in culture bottles   Culture   Final    NO GROWTH 3 DAYS Performed at [facility], [place] [address] [place], [place] [address]    Report Status PENDING  Incomplete     Labs: Basic Metabolic Panel: Recent Labs  Lab 09/23/21 1740 09/24/21 0137 09/25/21 0306 09/26/21 0316 09/27/21 0301  NA 140 137 137 141 141  K 4.7 4.5 4.3 4.2 4.4  CL 112* 110 109 114* 114*  CO2 19* 19* 20* 21* 20*  GLUCOSE 95 85 85 79 98  BUN 33* 32* 40* 28* 27*  CREATININE 2.10* 2.12* 2.19* 1.99* 1.94*  CALCIUM 9.1 8.7* 8.3* 8.0* 8.3*   Liver Function Tests: Recent Labs  Lab 09/23/21 1740  AST 21  ALT 18  ALKPHOS  71  BILITOT 0.5  PROT 7.9  ALBUMIN 3.2*   Recent Labs  Lab 09/23/21 1740  LIPASE 42   No results for input(s): AMMONIA in the last 168 hours. CBC: Recent Labs  Lab 09/23/21 1740 09/24/21 0137 09/25/21 0306 09/26/21 0316 09/27/21 0301  WBC 9.0 9.4 9.6 8.3 12.4*  NEUTROABS  --   --   --   --  10.8*  HGB 11.7* 10.8* 10.1* 9.7* 10.2*  HCT 37.6* 33.5* 30.8* 30.6* 32.1*  MCV 87.9 86.8 85.3 86.4 86.3  PLT 268 201 221 176 228   Cardiac Enzymes: No results for input(s): CKTOTAL, CKMB, CKMBINDEX, TROPONINI in the last 168 hours. BNP: BNP (last 3 results) No results for input(s): BNP in the last 8760 hours.  ProBNP (last 3 results) No results for input(s): PROBNP in the last 8760 hours.  CBG: No results for input(s): GLUCAP in the last 168 hours.     Signed:  Irine Seal MD.  [facility] 09/27/2021, 3:32 PM

## 2021-09-29 LAB — CULTURE, BLOOD (ROUTINE X 2)
Culture: NO GROWTH
Culture: NO GROWTH
Special Requests: ADEQUATE

## 2021-09-29 LAB — SURGICAL PATHOLOGY

## 2021-10-05 DIAGNOSIS — N412 Abscess of prostate: Secondary | ICD-10-CM | POA: Diagnosis not present

## 2021-10-06 DIAGNOSIS — N412 Abscess of prostate: Secondary | ICD-10-CM | POA: Diagnosis not present

## 2021-10-06 DIAGNOSIS — Z6825 Body mass index (BMI) 25.0-25.9, adult: Secondary | ICD-10-CM | POA: Diagnosis not present

## 2021-10-13 DIAGNOSIS — M1A00X0 Idiopathic chronic gout, unspecified site, without tophus (tophi): Secondary | ICD-10-CM | POA: Diagnosis not present

## 2021-10-13 DIAGNOSIS — I5042 Chronic combined systolic (congestive) and diastolic (congestive) heart failure: Secondary | ICD-10-CM | POA: Diagnosis not present

## 2021-10-13 DIAGNOSIS — I129 Hypertensive chronic kidney disease with stage 1 through stage 4 chronic kidney disease, or unspecified chronic kidney disease: Secondary | ICD-10-CM | POA: Diagnosis not present

## 2021-10-13 DIAGNOSIS — F1090 Alcohol use, unspecified, uncomplicated: Secondary | ICD-10-CM | POA: Diagnosis not present

## 2021-10-13 DIAGNOSIS — N184 Chronic kidney disease, stage 4 (severe): Secondary | ICD-10-CM | POA: Diagnosis not present

## 2021-10-19 DIAGNOSIS — M1A00X0 Idiopathic chronic gout, unspecified site, without tophus (tophi): Secondary | ICD-10-CM | POA: Diagnosis not present

## 2021-10-19 DIAGNOSIS — N412 Abscess of prostate: Secondary | ICD-10-CM | POA: Diagnosis not present

## 2021-10-19 DIAGNOSIS — Z6826 Body mass index (BMI) 26.0-26.9, adult: Secondary | ICD-10-CM | POA: Diagnosis not present

## 2021-10-19 DIAGNOSIS — G40509 Epileptic seizures related to external causes, not intractable, without status epilepticus: Secondary | ICD-10-CM | POA: Diagnosis not present

## 2021-10-19 DIAGNOSIS — Z7901 Long term (current) use of anticoagulants: Secondary | ICD-10-CM | POA: Diagnosis not present

## 2021-10-19 DIAGNOSIS — G8194 Hemiplegia, unspecified affecting left nondominant side: Secondary | ICD-10-CM | POA: Diagnosis not present

## 2021-10-19 DIAGNOSIS — N184 Chronic kidney disease, stage 4 (severe): Secondary | ICD-10-CM | POA: Diagnosis not present

## 2021-10-19 DIAGNOSIS — I10 Essential (primary) hypertension: Secondary | ICD-10-CM | POA: Diagnosis not present

## 2021-10-19 DIAGNOSIS — N183 Chronic kidney disease, stage 3 unspecified: Secondary | ICD-10-CM | POA: Diagnosis not present

## 2021-10-21 DIAGNOSIS — N401 Enlarged prostate with lower urinary tract symptoms: Secondary | ICD-10-CM | POA: Diagnosis not present

## 2021-10-21 DIAGNOSIS — N412 Abscess of prostate: Secondary | ICD-10-CM | POA: Diagnosis not present

## 2021-10-25 DIAGNOSIS — I5042 Chronic combined systolic (congestive) and diastolic (congestive) heart failure: Secondary | ICD-10-CM | POA: Diagnosis not present

## 2021-10-25 DIAGNOSIS — E559 Vitamin D deficiency, unspecified: Secondary | ICD-10-CM | POA: Diagnosis not present

## 2021-10-25 DIAGNOSIS — N183 Chronic kidney disease, stage 3 unspecified: Secondary | ICD-10-CM | POA: Diagnosis not present

## 2021-10-25 DIAGNOSIS — I129 Hypertensive chronic kidney disease with stage 1 through stage 4 chronic kidney disease, or unspecified chronic kidney disease: Secondary | ICD-10-CM | POA: Diagnosis not present

## 2021-11-04 DIAGNOSIS — Z20822 Contact with and (suspected) exposure to covid-19: Secondary | ICD-10-CM | POA: Diagnosis not present

## 2021-12-01 DIAGNOSIS — N412 Abscess of prostate: Secondary | ICD-10-CM | POA: Diagnosis not present

## 2021-12-14 DIAGNOSIS — Z7901 Long term (current) use of anticoagulants: Secondary | ICD-10-CM | POA: Diagnosis not present

## 2021-12-14 DIAGNOSIS — N184 Chronic kidney disease, stage 4 (severe): Secondary | ICD-10-CM | POA: Diagnosis not present

## 2021-12-14 DIAGNOSIS — I5042 Chronic combined systolic (congestive) and diastolic (congestive) heart failure: Secondary | ICD-10-CM | POA: Diagnosis not present

## 2021-12-14 DIAGNOSIS — M1A00X0 Idiopathic chronic gout, unspecified site, without tophus (tophi): Secondary | ICD-10-CM | POA: Diagnosis not present

## 2021-12-14 DIAGNOSIS — I10 Essential (primary) hypertension: Secondary | ICD-10-CM | POA: Diagnosis not present

## 2021-12-14 DIAGNOSIS — N412 Abscess of prostate: Secondary | ICD-10-CM | POA: Diagnosis not present

## 2021-12-14 DIAGNOSIS — G8194 Hemiplegia, unspecified affecting left nondominant side: Secondary | ICD-10-CM | POA: Diagnosis not present

## 2021-12-23 DIAGNOSIS — Z20822 Contact with and (suspected) exposure to covid-19: Secondary | ICD-10-CM | POA: Diagnosis not present

## 2021-12-29 DIAGNOSIS — F1090 Alcohol use, unspecified, uncomplicated: Secondary | ICD-10-CM | POA: Diagnosis not present

## 2021-12-29 DIAGNOSIS — M1A00X0 Idiopathic chronic gout, unspecified site, without tophus (tophi): Secondary | ICD-10-CM | POA: Diagnosis not present

## 2021-12-29 DIAGNOSIS — N184 Chronic kidney disease, stage 4 (severe): Secondary | ICD-10-CM | POA: Diagnosis not present

## 2021-12-29 DIAGNOSIS — I129 Hypertensive chronic kidney disease with stage 1 through stage 4 chronic kidney disease, or unspecified chronic kidney disease: Secondary | ICD-10-CM | POA: Diagnosis not present

## 2021-12-29 DIAGNOSIS — I5042 Chronic combined systolic (congestive) and diastolic (congestive) heart failure: Secondary | ICD-10-CM | POA: Diagnosis not present

## 2021-12-29 DIAGNOSIS — N2581 Secondary hyperparathyroidism of renal origin: Secondary | ICD-10-CM | POA: Diagnosis not present

## 2022-01-19 DIAGNOSIS — Z79899 Other long term (current) drug therapy: Secondary | ICD-10-CM | POA: Diagnosis not present

## 2022-01-19 DIAGNOSIS — M25569 Pain in unspecified knee: Secondary | ICD-10-CM | POA: Diagnosis not present

## 2022-01-19 DIAGNOSIS — N1832 Chronic kidney disease, stage 3b: Secondary | ICD-10-CM | POA: Diagnosis not present

## 2022-01-19 DIAGNOSIS — M1A09X1 Idiopathic chronic gout, multiple sites, with tophus (tophi): Secondary | ICD-10-CM | POA: Diagnosis not present

## 2022-01-19 DIAGNOSIS — Z8673 Personal history of transient ischemic attack (TIA), and cerebral infarction without residual deficits: Secondary | ICD-10-CM | POA: Diagnosis not present

## 2022-01-19 DIAGNOSIS — M150 Primary generalized (osteo)arthritis: Secondary | ICD-10-CM | POA: Diagnosis not present

## 2022-01-20 DIAGNOSIS — H25812 Combined forms of age-related cataract, left eye: Secondary | ICD-10-CM | POA: Diagnosis not present

## 2022-01-20 DIAGNOSIS — H53462 Homonymous bilateral field defects, left side: Secondary | ICD-10-CM | POA: Diagnosis not present

## 2022-01-20 DIAGNOSIS — Z961 Presence of intraocular lens: Secondary | ICD-10-CM | POA: Diagnosis not present

## 2022-01-20 DIAGNOSIS — H40023 Open angle with borderline findings, high risk, bilateral: Secondary | ICD-10-CM | POA: Diagnosis not present

## 2022-01-25 DIAGNOSIS — Z20828 Contact with and (suspected) exposure to other viral communicable diseases: Secondary | ICD-10-CM | POA: Diagnosis not present

## 2022-01-27 DIAGNOSIS — Z20822 Contact with and (suspected) exposure to covid-19: Secondary | ICD-10-CM | POA: Diagnosis not present

## 2022-03-16 DIAGNOSIS — Z7901 Long term (current) use of anticoagulants: Secondary | ICD-10-CM | POA: Diagnosis not present

## 2022-03-16 DIAGNOSIS — M1A00X0 Idiopathic chronic gout, unspecified site, without tophus (tophi): Secondary | ICD-10-CM | POA: Diagnosis not present

## 2022-03-16 DIAGNOSIS — G8194 Hemiplegia, unspecified affecting left nondominant side: Secondary | ICD-10-CM | POA: Diagnosis not present

## 2022-03-16 DIAGNOSIS — R569 Unspecified convulsions: Secondary | ICD-10-CM | POA: Diagnosis not present

## 2022-03-16 DIAGNOSIS — N184 Chronic kidney disease, stage 4 (severe): Secondary | ICD-10-CM | POA: Diagnosis not present

## 2022-03-16 DIAGNOSIS — I5042 Chronic combined systolic (congestive) and diastolic (congestive) heart failure: Secondary | ICD-10-CM | POA: Diagnosis not present

## 2022-03-16 DIAGNOSIS — H1840 Unspecified corneal degeneration: Secondary | ICD-10-CM | POA: Diagnosis not present

## 2022-03-23 ENCOUNTER — Encounter: Payer: Self-pay | Admitting: Neurology

## 2022-03-23 ENCOUNTER — Ambulatory Visit (INDEPENDENT_AMBULATORY_CARE_PROVIDER_SITE_OTHER): Payer: Medicare Other | Admitting: Neurology

## 2022-03-23 VITALS — BP 169/79 | HR 68 | Ht 68.0 in | Wt 183.5 lb

## 2022-03-23 DIAGNOSIS — G40909 Epilepsy, unspecified, not intractable, without status epilepticus: Secondary | ICD-10-CM | POA: Diagnosis not present

## 2022-03-23 DIAGNOSIS — I69354 Hemiplegia and hemiparesis following cerebral infarction affecting left non-dominant side: Secondary | ICD-10-CM

## 2022-03-23 MED ORDER — LEVETIRACETAM ER 750 MG PO TB24: 1500.0000 mg | ORAL_TABLET | Freq: Every day | ORAL | 4 refills | Status: DC

## 2022-04-08 DIAGNOSIS — I5042 Chronic combined systolic (congestive) and diastolic (congestive) heart failure: Secondary | ICD-10-CM | POA: Diagnosis not present

## 2022-04-08 DIAGNOSIS — N2581 Secondary hyperparathyroidism of renal origin: Secondary | ICD-10-CM | POA: Diagnosis not present

## 2022-04-08 DIAGNOSIS — F1090 Alcohol use, unspecified, uncomplicated: Secondary | ICD-10-CM | POA: Diagnosis not present

## 2022-04-08 DIAGNOSIS — M1A00X0 Idiopathic chronic gout, unspecified site, without tophus (tophi): Secondary | ICD-10-CM | POA: Diagnosis not present

## 2022-04-08 DIAGNOSIS — N184 Chronic kidney disease, stage 4 (severe): Secondary | ICD-10-CM | POA: Diagnosis not present

## 2022-04-08 DIAGNOSIS — I129 Hypertensive chronic kidney disease with stage 1 through stage 4 chronic kidney disease, or unspecified chronic kidney disease: Secondary | ICD-10-CM | POA: Diagnosis not present

## 2022-05-23 DIAGNOSIS — J111 Influenza due to unidentified influenza virus with other respiratory manifestations: Secondary | ICD-10-CM | POA: Diagnosis not present

## 2022-06-15 DIAGNOSIS — L509 Urticaria, unspecified: Secondary | ICD-10-CM | POA: Diagnosis not present

## 2022-06-15 DIAGNOSIS — R21 Rash and other nonspecific skin eruption: Secondary | ICD-10-CM | POA: Diagnosis not present

## 2022-06-26 DIAGNOSIS — I129 Hypertensive chronic kidney disease with stage 1 through stage 4 chronic kidney disease, or unspecified chronic kidney disease: Secondary | ICD-10-CM | POA: Diagnosis not present

## 2022-06-26 DIAGNOSIS — N183 Chronic kidney disease, stage 3 unspecified: Secondary | ICD-10-CM | POA: Diagnosis not present

## 2022-06-26 DIAGNOSIS — I5042 Chronic combined systolic (congestive) and diastolic (congestive) heart failure: Secondary | ICD-10-CM | POA: Diagnosis not present

## 2022-06-28 DIAGNOSIS — L501 Idiopathic urticaria: Secondary | ICD-10-CM | POA: Diagnosis not present

## 2022-06-28 DIAGNOSIS — Z23 Encounter for immunization: Secondary | ICD-10-CM | POA: Diagnosis not present

## 2022-06-28 DIAGNOSIS — N184 Chronic kidney disease, stage 4 (severe): Secondary | ICD-10-CM | POA: Diagnosis not present

## 2022-06-29 DIAGNOSIS — I129 Hypertensive chronic kidney disease with stage 1 through stage 4 chronic kidney disease, or unspecified chronic kidney disease: Secondary | ICD-10-CM | POA: Diagnosis not present

## 2022-06-29 DIAGNOSIS — N184 Chronic kidney disease, stage 4 (severe): Secondary | ICD-10-CM | POA: Diagnosis not present

## 2022-06-29 DIAGNOSIS — I5042 Chronic combined systolic (congestive) and diastolic (congestive) heart failure: Secondary | ICD-10-CM | POA: Diagnosis not present

## 2022-06-29 DIAGNOSIS — R809 Proteinuria, unspecified: Secondary | ICD-10-CM | POA: Diagnosis not present

## 2022-06-29 DIAGNOSIS — N2581 Secondary hyperparathyroidism of renal origin: Secondary | ICD-10-CM | POA: Diagnosis not present

## 2022-06-29 DIAGNOSIS — M1A00X0 Idiopathic chronic gout, unspecified site, without tophus (tophi): Secondary | ICD-10-CM | POA: Diagnosis not present

## 2022-06-29 DIAGNOSIS — F1090 Alcohol use, unspecified, uncomplicated: Secondary | ICD-10-CM | POA: Diagnosis not present

## 2022-07-09 ENCOUNTER — Telehealth: Payer: Self-pay | Admitting: *Deleted

## 2022-07-09 NOTE — Chronic Care Management (AMB) (Signed)
  Care Coordination   Note   07/09/2022 Name: Aldrin Engelhard Sr. MRN: [id number] DOB: [birth year]  Floreen Comber Sr. is a 71 y.o. year old male who sees [name], MD for primary care. I reached out to [employer] Sr[employer] by phone today to offer care coordination services.  Mr. Cheramie was given information about Care Coordination services today including:   The Care Coordination services include support from the care team which includes your Nurse Coordinator, Clinical Social Worker, or Pharmacist.  The Care Coordination team is here to help remove barriers to the health concerns and goals most important to you. Care Coordination services are voluntary, and the patient may decline or stop services at any time by request to their care team member.   Care Coordination Consent Status: Patient agreed to services and verbal consent obtained.   Follow up plan:  Telephone appointment with care coordination team member scheduled for:  07/27/22  Encounter Outcome:  Pt. Scheduled

## 2022-07-21 DIAGNOSIS — Z8673 Personal history of transient ischemic attack (TIA), and cerebral infarction without residual deficits: Secondary | ICD-10-CM | POA: Diagnosis not present

## 2022-07-21 DIAGNOSIS — M25569 Pain in unspecified knee: Secondary | ICD-10-CM | POA: Diagnosis not present

## 2022-07-21 DIAGNOSIS — N1832 Chronic kidney disease, stage 3b: Secondary | ICD-10-CM | POA: Diagnosis not present

## 2022-07-21 DIAGNOSIS — M25551 Pain in right hip: Secondary | ICD-10-CM | POA: Diagnosis not present

## 2022-07-21 DIAGNOSIS — Z79899 Other long term (current) drug therapy: Secondary | ICD-10-CM | POA: Diagnosis not present

## 2022-07-21 DIAGNOSIS — M150 Primary generalized (osteo)arthritis: Secondary | ICD-10-CM | POA: Diagnosis not present

## 2022-07-21 DIAGNOSIS — M549 Dorsalgia, unspecified: Secondary | ICD-10-CM | POA: Diagnosis not present

## 2022-07-21 DIAGNOSIS — M1A09X1 Idiopathic chronic gout, multiple sites, with tophus (tophi): Secondary | ICD-10-CM | POA: Diagnosis not present

## 2022-07-26 ENCOUNTER — Telehealth: Payer: Self-pay | Admitting: Physician Assistant

## 2022-07-26 DIAGNOSIS — I10 Essential (primary) hypertension: Secondary | ICD-10-CM | POA: Diagnosis not present

## 2022-07-26 NOTE — Telephone Encounter (Signed)
Scheduled appt per 10/30 referral. Pt is aware of appt date and time. Pt is aware to arrive 15 mins prior to appt time and to bring and updated insurance card. Pt is aware of appt location.

## 2022-07-27 ENCOUNTER — Ambulatory Visit: Payer: Self-pay

## 2022-07-27 DIAGNOSIS — I130 Hypertensive heart and chronic kidney disease with heart failure and stage 1 through stage 4 chronic kidney disease, or unspecified chronic kidney disease: Secondary | ICD-10-CM | POA: Diagnosis not present

## 2022-07-27 DIAGNOSIS — I5042 Chronic combined systolic (congestive) and diastolic (congestive) heart failure: Secondary | ICD-10-CM | POA: Diagnosis not present

## 2022-07-27 DIAGNOSIS — N183 Chronic kidney disease, stage 3 unspecified: Secondary | ICD-10-CM | POA: Diagnosis not present

## 2022-07-27 NOTE — Patient Instructions (Signed)
Visit Information  Thank you for taking time to visit with me today. Please don't hesitate to contact me if I can be of assistance to you.   Following are the goals we discussed today:   Goals Addressed             This Visit's Progress    I would like for him to be more active       Care Coordination Interventions: Evaluation of current treatment plan related to impaired physical activity and patient's adherence to plan as established by provider Discussed with patient's wife her concerns about patient's decreased physical activity  Determined patient has some physical limitations secondary to having a CVA, personality and mood changes have also been affected Educated wife regarding basic disease process that may be a result of brain injury following a Stroke Assessed for sign/symptoms of depression, patient denies having symptoms of depression to his wife  Determined patient does not wish to participate in his HEP as directed by PT  Instructed wife to keep patient's doctor well informed of ongoing concerns and or if worsening symptoms occur           Our next appointment is by telephone on 08/25/22 at 1:30 PM  Please call the care guide team at [phone] if you need to cancel or reschedule your appointment.   If you are experiencing a Mental Health or [place] or need someone to talk to, please call 1-800-273-TALK (toll free, 24 hour hotline) go to [facility] [place] [phone])  The patient verbalized understanding of instructions, educational materials, and care plan provided today and agreed to receive a mailed copy of patient instructions, educational materials, and care plan.   Barb Merino, RN, BSN, CCM Care Management Coordinator [facility] Care Management Direct Phone: [phone]

## 2022-07-27 NOTE — Patient Outreach (Signed)
  Care Coordination   Initial Visit Note   07/27/2022 Name: Mitchell Tejada Sr. MRN: [id number] DOB: [birth year]  Mitchell Comber Sr. is a 71 y.o. year old male who sees Mitchell Beard, MD for primary care. I spoke with wife Mitchell [name] by phone today.  What matters to the patients health and wellness today?  Wife is concerned about patient's physical inactivity.      Goals Addressed             This Visit's Progress    I would like for him to be more active       Care Coordination Interventions: Evaluation of current treatment plan related to impaired physical activity and patient's adherence to plan as established by provider Discussed with patient's wife her concerns about patient's decreased physical activity  Determined patient has some physical limitations secondary to having a CVA, personality and mood changes have also been affected Educated wife regarding basic disease process that may be a result of brain injury following a Stroke Assessed for sign/symptoms of depression, patient denies having symptoms of depression to his wife  Determined patient does not wish to participate in his HEP as directed by PT  Instructed wife to keep patient's doctor well informed of ongoing concerns and or if worsening symptoms occur           SDOH assessments and interventions completed:  Yes  SDOH Interventions Today    Flowsheet Row Most Recent Value  SDOH Interventions   Food Insecurity Interventions Intervention Not Indicated  Housing Interventions Intervention Not Indicated  Transportation Interventions Intervention Not Indicated        Care Coordination Interventions Activated:  Yes  Care Coordination Interventions:  Yes, provided   Follow up plan: Follow up call scheduled for 08/25/22 '@1'$ :30 PM     Encounter Outcome:  Pt. Visit Completed

## 2022-08-11 ENCOUNTER — Encounter: Payer: Self-pay | Admitting: Physician Assistant

## 2022-08-11 ENCOUNTER — Other Ambulatory Visit: Payer: Self-pay

## 2022-08-11 ENCOUNTER — Inpatient Hospital Stay: Payer: Medicare Other

## 2022-08-11 ENCOUNTER — Inpatient Hospital Stay: Payer: Medicare Other | Attending: Physician Assistant | Admitting: Physician Assistant

## 2022-08-11 VITALS — BP 140/68 | HR 84 | Temp 99.5°F | Resp 18 | Ht 68.0 in | Wt 181.3 lb

## 2022-08-11 DIAGNOSIS — Z79899 Other long term (current) drug therapy: Secondary | ICD-10-CM | POA: Insufficient documentation

## 2022-08-11 DIAGNOSIS — Z8673 Personal history of transient ischemic attack (TIA), and cerebral infarction without residual deficits: Secondary | ICD-10-CM | POA: Diagnosis not present

## 2022-08-11 DIAGNOSIS — I10 Essential (primary) hypertension: Secondary | ICD-10-CM | POA: Diagnosis not present

## 2022-08-11 DIAGNOSIS — D472 Monoclonal gammopathy: Secondary | ICD-10-CM | POA: Insufficient documentation

## 2022-08-11 DIAGNOSIS — M109 Gout, unspecified: Secondary | ICD-10-CM | POA: Insufficient documentation

## 2022-08-11 DIAGNOSIS — E785 Hyperlipidemia, unspecified: Secondary | ICD-10-CM | POA: Diagnosis not present

## 2022-08-11 DIAGNOSIS — M545 Low back pain, unspecified: Secondary | ICD-10-CM | POA: Insufficient documentation

## 2022-08-11 DIAGNOSIS — G8929 Other chronic pain: Secondary | ICD-10-CM | POA: Insufficient documentation

## 2022-08-11 DIAGNOSIS — R569 Unspecified convulsions: Secondary | ICD-10-CM | POA: Diagnosis not present

## 2022-08-11 DIAGNOSIS — D696 Thrombocytopenia, unspecified: Secondary | ICD-10-CM | POA: Insufficient documentation

## 2022-08-11 DIAGNOSIS — F1010 Alcohol abuse, uncomplicated: Secondary | ICD-10-CM | POA: Diagnosis not present

## 2022-08-11 DIAGNOSIS — Z7901 Long term (current) use of anticoagulants: Secondary | ICD-10-CM | POA: Diagnosis not present

## 2022-08-11 LAB — CMP (CANCER CENTER ONLY)
ALT: 12 U/L (ref 0–44)
AST: 15 U/L (ref 15–41)
Albumin: 3.5 g/dL (ref 3.5–5.0)
Alkaline Phosphatase: 72 U/L (ref 38–126)
Anion gap: 6 (ref 5–15)
BUN: 48 mg/dL — ABNORMAL HIGH (ref 8–23)
CO2: 20 mmol/L — ABNORMAL LOW (ref 22–32)
Calcium: 8.8 mg/dL — ABNORMAL LOW (ref 8.9–10.3)
Chloride: 113 mmol/L — ABNORMAL HIGH (ref 98–111)
Creatinine: 2.72 mg/dL — ABNORMAL HIGH (ref 0.61–1.24)
GFR, Estimated: 24 mL/min — ABNORMAL LOW (ref >60)
Glucose, Bld: 97 mg/dL (ref 70–99)
Potassium: 4.6 mmol/L (ref 3.5–5.1)
Sodium: 139 mmol/L (ref 135–145)
Total Bilirubin: 0.3 mg/dL (ref 0.3–1.2)
Total Protein: 7.8 g/dL (ref 6.5–8.1)

## 2022-08-11 LAB — CBC WITH DIFFERENTIAL (CANCER CENTER ONLY)
Abs Immature Granulocytes: 0.01 10*3/uL (ref 0.00–0.07)
Basophils Absolute: 0 10*3/uL (ref 0.0–0.1)
Basophils Relative: 1 %
Eosinophils Absolute: 0.2 10*3/uL (ref 0.0–0.5)
Eosinophils Relative: 3 %
HCT: 37 % — ABNORMAL LOW (ref 39.0–52.0)
Hemoglobin: 12.1 g/dL — ABNORMAL LOW (ref 13.0–17.0)
Immature Granulocytes: 0 %
Lymphocytes Relative: 21 %
Lymphs Abs: 1.3 10*3/uL (ref 0.7–4.0)
MCH: 28.1 pg (ref 26.0–34.0)
MCHC: 32.7 g/dL (ref 30.0–36.0)
MCV: 86 fL (ref 80.0–100.0)
Monocytes Absolute: 0.4 10*3/uL (ref 0.1–1.0)
Monocytes Relative: 6 %
Neutro Abs: 4.4 10*3/uL (ref 1.7–7.7)
Neutrophils Relative %: 69 %
Platelet Count: 151 10*3/uL (ref 150–400)
RBC: 4.3 MIL/uL (ref 4.22–5.81)
RDW: 16.1 % — ABNORMAL HIGH (ref 11.5–15.5)
WBC Count: 6.3 10*3/uL (ref 4.0–10.5)
nRBC: 0 % (ref 0.0–0.2)

## 2022-08-11 NOTE — Progress Notes (Signed)
[facility] [phone]   [phone] [place] NOTE  Patient Care Team: [name], MD as PCP - General (Family Medicine) [name], MD as Consulting Physician (Internal Medicine) [name], MD (Family Medicine) Rex Kras Claudette Stapler, RN as [place] History -06/29/2022: Labs from nephrologist, Dr. Harrie Jeans: SPEP detected M spike measuring 0.3 g/dL. IFE showed IgG monoclonal protein with lambda light chain specificity. Kappa 66.1, Lambda 94.3, Ratio 0.70. Creatinine 2.49, Calcium 9.0, Hgb 11.4.  -08/11/2022: Establish care with [facility] with Dr. Narda Rutherford and Dede Query PA-C  CHIEF COMPLAINTS/PURPOSE OF CONSULTATION:  IgG Lambda monoclonal gammopathy  HISTORY OF PRESENTING ILLNESS:  Floreen Comber Sr. 71 y.o. male with medical history significant for hypertension, CVA, hyperlipidemia,, seizures and gout presents to the hematology clinic for IgG lambda monoclonal gammopathy. He is accompanied by his wife for this visit.   On exam today, Mr. Pruett reports his energy levels are fairly stable. He does have some balance issues so uses a cane to help with ambulation. He denies any appetite or weight loss. He denies any nausea, vomiting or abdominal pain. His bowel habits are unchanged without any recurrent episodes of diarrhea or constipation. He denies easy bruising or signs of active bleeding. He has chronic low back pain that is unchanged in severity. He denies fevers, chills, sweats, shortness of breath, chest pain or cough. He has no other complaints. Rest of the 10 point ROS is below.   MEDICAL HISTORY:  Past Medical History:  Diagnosis Date   Alcohol abuse    Anemia    Anoxic brain injury ([place])    Chronic pulmonary edema    CVA (cerebral vascular accident) ([place])    Dysphagia    Gout    Hemiplegia affecting left dominant side (HCC)    History of stroke     Hyperlipemia    Hypertension    Seizures ([place])    Stroke ([place])    Thrombocytopenia ([place])     SURGICAL HISTORY: Past Surgical History:  Procedure Laterality Date   lymph nodes     biopsy - benign   RADIOLOGY WITH ANESTHESIA N/A 07/17/2015   Procedure: RADIOLOGY WITH ANESTHESIA;  Surgeon: Medication Radiologist, MD;  Location: [place];  Service: Radiology;  Laterality: N/A;   TRANSURETHRAL RESECTION OF PROSTATE N/A 09/26/2021   Procedure: TRANSURETHRAL RESECTION AND UNROOFING OF PROSTATIC ABSCESS;  Surgeon: Franchot Gallo, MD;  Location: [facility];  Service: Urology;  Laterality: N/A;    SOCIAL HISTORY: Social History   Socioeconomic History   Marital status: Married    Spouse name: Not on file   Number of children: 2   Years of education: 11th grade   Highest education level: Not on file  Occupational History   Occupation: Retired  Tobacco Use   Smoking status: Never   Smokeless tobacco: Never  Vaping Use   Vaping Use: Never used  Substance and Sexual Activity   Alcohol use: Never    Comment: History of alcohol abuse   Drug use: Never    Types: Marijuana   Sexual activity: Never  Other Topics Concern   Not on file  Social History Narrative   ** Merged History Encounter **       He is currently in rehab following a fall. He is hoping to be discharged home soon.  Lives with his wife at home. Right-handed. Drinks decaf coffee   Social Determinants of Radio broadcast assistant  Strain: Not on file  Food Insecurity: No Food Insecurity (07/27/2022)   Hunger Vital Sign    Worried About Running Out of Food in the Last Year: Never true    Ran Out of Food in the Last Year: Never true  Transportation Needs: No Transportation Needs (07/27/2022)   PRAPARE - Hydrologist (Medical): No    Lack of Transportation (Non-Medical): No  Physical Activity: Not on file  Stress: Not on file  Social Connections: Not on file  Intimate Partner  Violence: Not on file    FAMILY HISTORY: Family History  Problem Relation Age of Onset   Aneurysm Sister    Clotting disorder Brother    Aneurysm Mother    Heart attack Father     ALLERGIES:  is allergic to hydrochlorothiazide and pseudoephedrine.  MEDICATIONS:  Current Outpatient Medications  Medication Sig Dispense Refill   acetaminophen (TYLENOL) 325 MG tablet Take 2 tablets (650 mg total) by mouth every 6 (six) hours as needed for mild pain, moderate pain or fever (or Fever >/= 101).     allopurinol (ZYLOPRIM) 100 MG tablet Take 100 mg by mouth daily.     amLODipine (NORVASC) 10 MG tablet Take 1 tablet (10 mg total) by mouth daily. 30 tablet 0   calcium-vitamin D (OSCAL WITH D) 500-200 MG-UNIT tablet Take 1 tablet by mouth 2 (two) times daily. 60 tablet 0   carvedilol (COREG) 25 MG tablet Take 1 tablet (25 mg total) by mouth 2 (two) times daily with a meal. 60 tablet 0   ferrous sulfate 325 (65 FE) MG tablet Take 1 tablet (325 mg total) by mouth daily with breakfast. 30 tablet 0   folic acid (FOLVITE) 1 MG tablet Take 1 tablet (1 mg total) by mouth daily. 30 tablet 0   furosemide (LASIX) 20 MG tablet Take 1 tablet (20 mg total) by mouth daily. 30 tablet 0   Levetiracetam (KEPPRA XR) 750 MG TB24 Take 2 tablets (1,500 mg total) by mouth at bedtime. 180 tablet 4   multivitamin (PROSIGHT) TABS tablet Take 1 tablet by mouth daily. 30 each 0   oxyCODONE (OXY IR/ROXICODONE) 5 MG immediate release tablet Take 1 tablet (5 mg total) by mouth every 6 (six) hours as needed for moderate pain or severe pain. 12 tablet 0   pantoprazole (PROTONIX) 40 MG tablet Take 1 tablet (40 mg total) by mouth daily. 30 tablet 0   rivaroxaban (XARELTO) 20 MG TABS tablet Take 1 tablet (20 mg total) by mouth daily with supper. 30 tablet 0   thiamine 100 MG tablet Take 1 tablet (100 mg total) by mouth daily. 30 tablet 0   traMADol (ULTRAM) 50 MG tablet Take 1 tablet (50 mg total) by mouth every 6 (six) hours as  needed. (Patient taking differently: Take 50 mg by mouth every 6 (six) hours as needed for moderate pain.) 15 tablet 0   Calcium Carb-Cholecalciferol (OYSTER SHELL CALCIUM W/D) 500-5 MG-MCG TABS Take 1 tablet by mouth 2 (two) times daily. (Patient not taking: Reported on 08/11/2022)     cetirizine (ZYRTEC) 10 MG tablet Take 10 mg by mouth 2 (two) times daily.     magnesium oxide (MAG-OX) 400 MG tablet Take 1 tablet (400 mg total) by mouth daily. (Patient not taking: Reported on 08/11/2022) 30 tablet 0   No current facility-administered medications for this visit.    REVIEW OF SYSTEMS:   Constitutional: ( - ) fevers, ( - )  chills , ( - )  night sweats Eyes: ( - ) blurriness of vision, ( - ) double vision, ( - ) watery eyes Ears, nose, mouth, throat, and face: ( - ) mucositis, ( - ) sore throat Respiratory: ( - ) cough, ( - ) dyspnea, ( - ) wheezes Cardiovascular: ( - ) palpitation, ( - ) chest discomfort, ( - ) lower extremity swelling Gastrointestinal:  ( - ) nausea, ( - ) heartburn, ( - ) change in bowel habits Skin: ( - ) abnormal skin rashes Lymphatics: ( - ) new lymphadenopathy, ( - ) easy bruising Neurological: ( - ) numbness, ( - ) tingling, ( - ) new weaknesses Behavioral/Psych: ( - ) mood change, ( - ) new changes  All other systems were reviewed with the patient and are negative.  PHYSICAL EXAMINATION: ECOG PERFORMANCE STATUS: 1 - Symptomatic but completely ambulatory  Vitals:   08/11/22 1405  BP: (!) 140/68  Pulse: 84  Resp: 18  Temp: 99.5 F (37.5 C)  SpO2: 98%   Filed Weights   08/11/22 1405  Weight: 181 lb 4.8 oz (82.2 kg)    GENERAL: well appearing male in NAD  SKIN: skin color, texture, turgor are normal, no rashes or significant lesions EYES: conjunctiva are pink and non-injected, sclera clear LUNGS: clear to auscultation and percussion with normal breathing effort HEART: regular rate & rhythm and no murmurs and no lower extremity edema Musculoskeletal: no  cyanosis of digits and no clubbing  PSYCH: alert & oriented x 3, fluent speech NEURO: no focal motor/sensory deficits  LABORATORY DATA:  I have reviewed the data as listed    Latest Ref Rng & Units 09/27/2021    3:01 AM 09/26/2021    3:16 AM 09/25/2021    3:06 AM  CBC  WBC 4.0 - 10.5 K/uL 12.4  8.3  9.6   Hemoglobin 13.0 - 17.0 g/dL 10.2  9.7  10.1   Hematocrit 39.0 - 52.0 % 32.1  30.6  30.8   Platelets 150 - 400 K/uL 228  176  221        Latest Ref Rng & Units 09/27/2021    3:01 AM 09/26/2021    3:16 AM 09/25/2021    3:06 AM  CMP  Glucose 70 - 99 mg/dL 98  79  85   BUN 8 - 23 mg/dL 27  28  40   Creatinine 0.61 - 1.24 mg/dL 1.94  1.99  2.19   Sodium 135 - 145 mmol/L 141  141  137   Potassium 3.5 - 5.1 mmol/L 4.4  4.2  4.3   Chloride 98 - 111 mmol/L 114  114  109   CO2 22 - 32 mmol/L _0 Calcium 8.9 - 10.3 mg/dL 8.3  8.0  8.3     ASSESSMENT & PLAN Floreen Comber Sr. Is a 71 y.o. male who presents to the hematology clinic for monoclonal gammopathy. Based on outside SPEP from 06/29/2022, there is evidence of M spike measuring 0.3 g/dL. Immunofixation showed IgG monoclonal protein with lambda specificity.  Recommend workup with laboratory evaluation today to check CBC, CMP, multiple myeloma panel, and kappa/lambda light chain. Need 24 hour UPEP. Bone Survey will be obtained to evaluate for lytic lesions. Plan to follow up with results by phone. If no additional intervention is required, we will plan to see patient back in clinic in 6 months.    #IgG Lambda Monoclonal gammopathy: --Labs today to check CBC, CMP, multiple myeloma panel, kappa/lambda light  chain. --Need 24 hour UPEP --Need to obtain DG bone met surgery to evaluate for lytic lesions.  --Will consider bone marrow biopsy based on above workup --RTC in 6 months with labs unless further intervention is required.    No orders of the defined types were placed in this encounter.   All questions were answered. The  patient knows to call the clinic with any problems, questions or concerns.  I have spent a total of 60 minutes minutes of face-to-face and non-face-to-face time, preparing to see the patient, obtaining and/or reviewing separately obtained history, performing a medically appropriate examination, counseling and educating the patient, ordering tests/procedures, documenting clinical information in the electronic health record, and care coordination.   Dede Query, PA-C Department of Hematology/Oncology [place] at [facility] Phone: [phone]  Patient was seen with Dr. Lorenso Courier  I have read the above note and personally examined the patient. I agree with the assessment and plan as noted above.  Briefly Mr. Cherie Ouch is a 71 year old male with medical history significant for an IgG lambda monoclonal dermopathy who presents for further evaluation.  At this time there are no clear red flag indicators.  We will conduct a full workup to include SPEP, UPEP, serum free light chains, and metastatic survey.  If there are any concerning findings on her workup we will need to pursue a bone marrow biopsy.  If not this patient would be diagnosed with monoclonal gammopathy of undetermined significance and will follow-up in clinic in 6 months time.  The patient voiced understanding of our findings and the plan moving forward.   Ledell Peoples, MD Department of Hematology/Oncology [place] at [facility] Phone: [phone] Pager: [phone] Email: [email or web address]

## 2022-08-12 LAB — KAPPA/LAMBDA LIGHT CHAINS
Kappa free light chain: 79.1 mg/L — ABNORMAL HIGH (ref 3.3–19.4)
Kappa, lambda light chain ratio: 0.64 (ref 0.26–1.65)
Lambda free light chains: 124.5 mg/L — ABNORMAL HIGH (ref 5.7–26.3)

## 2022-08-13 ENCOUNTER — Ambulatory Visit (HOSPITAL_COMMUNITY)
Admission: RE | Admit: 2022-08-13 | Discharge: 2022-08-13 | Disposition: A | Discharge status: Home / Self Care | Payer: Medicare Other | Source: Ambulatory Visit | Attending: Physician Assistant | Admitting: Physician Assistant

## 2022-08-13 DIAGNOSIS — D472 Monoclonal gammopathy: Secondary | ICD-10-CM | POA: Insufficient documentation

## 2022-08-16 LAB — MULTIPLE MYELOMA PANEL, SERUM
Albumin SerPl Elph-Mcnc: 3.5 g/dL (ref 2.9–4.4)
Albumin/Glob SerPl: 1 (ref 0.7–1.7)
Alpha 1: 0.3 g/dL (ref 0.0–0.4)
Alpha2 Glob SerPl Elph-Mcnc: 1 g/dL (ref 0.4–1.0)
B-Globulin SerPl Elph-Mcnc: 1.6 g/dL — ABNORMAL HIGH (ref 0.7–1.3)
Gamma Glob SerPl Elph-Mcnc: 0.9 g/dL (ref 0.4–1.8)
Globulin, Total: 3.8 g/dL (ref 2.2–3.9)
IgA: 508 mg/dL — ABNORMAL HIGH (ref 61–437)
IgG (Immunoglobin G), Serum: 1256 mg/dL (ref 603–1613)
IgM (Immunoglobulin M), Srm: 92 mg/dL (ref 15–143)
M Protein SerPl Elph-Mcnc: 0.3 g/dL — ABNORMAL HIGH
Total Protein ELP: 7.3 g/dL (ref 6.0–8.5)

## 2022-08-16 LAB — UPEP/UIFE/LIGHT CHAINS/TP, 24-HR UR
% BETA, Urine: 7.3 %
ALPHA 1 URINE: 4.4 %
Albumin, U: 72.4 %
Alpha 2, Urine: 4 %
Free Kappa Lt Chains,Ur: 38.16 mg/L (ref 1.17–86.46)
Free Kappa/Lambda Ratio: 1.53 — ABNORMAL LOW (ref 1.83–14.26)
Free Lambda Lt Chains,Ur: 24.99 mg/L — ABNORMAL HIGH (ref 0.27–15.21)
GAMMA GLOBULIN URINE: 12 %
M-SPIKE %, Urine: 3.9 % — ABNORMAL HIGH
M-Spike, Mg/24 Hr: 98 mg/24 hr — ABNORMAL HIGH
Total Protein, Urine-Ur/day: 2501 mg/24 hr — ABNORMAL HIGH (ref 30–150)
Total Protein, Urine: 119.1 mg/dL
Total Volume: 2100

## 2022-08-23 ENCOUNTER — Telehealth: Payer: Self-pay | Admitting: Physician Assistant

## 2022-08-23 DIAGNOSIS — I69354 Hemiplegia and hemiparesis following cerebral infarction affecting left non-dominant side: Secondary | ICD-10-CM | POA: Diagnosis not present

## 2022-08-23 DIAGNOSIS — D472 Monoclonal gammopathy: Secondary | ICD-10-CM | POA: Diagnosis not present

## 2022-08-23 DIAGNOSIS — N1832 Chronic kidney disease, stage 3b: Secondary | ICD-10-CM | POA: Diagnosis not present

## 2022-08-23 DIAGNOSIS — I5032 Chronic diastolic (congestive) heart failure: Secondary | ICD-10-CM | POA: Diagnosis not present

## 2022-08-23 DIAGNOSIS — I110 Hypertensive heart disease with heart failure: Secondary | ICD-10-CM | POA: Diagnosis not present

## 2022-08-23 NOTE — Telephone Encounter (Signed)
I spoke to patient's wife, Ildefonso Keaney, to review the lab results from 08/11/2022. 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 We would recommend a kidney biopsy to rule out multiple myeloma as patient's CKD has worsened over time. We will reach out to patient's nephrologist, Dr. Royce Macadamia, to help arrange the biopsy. We will see patient back in 6 months to monitor labs. Mrs. Voiles expressed understanding and satisfaction with the plan provided.

## 2022-08-25 ENCOUNTER — Ambulatory Visit: Payer: Self-pay

## 2022-08-25 NOTE — Patient Instructions (Signed)
Visit Information  Thank you for taking time to visit with me today. Please don't hesitate to contact me if I can be of assistance to you.   Following are the goals we discussed today:   Goals Addressed             This Visit's Progress    I would like for him to be more active       Care Coordination Interventions: Evaluation of current treatment plan related to impaired physical activity and patient's adherence to plan as established by provider Determined patient continues to be inactive in the home, he is not performing his HEP per his wife Spoke with patient, educated patient about the provider referral exercise program, he will consider Provided wife with this RN contact number to call if patient would like to participate in the PREP program        Renal biopsy recommended       Care Coordination Interventions: Evaluation of current treatment plan related to MGUS and patient's adherence to plan as established by provider Completed successful outbound call to wife [name] Determined patient completed a follow up with Oncology for evaluation of MGUS Review of patient status, including review of consultant's reports, relevant laboratory and other test results, and medications completed Reviewed and discussed wife understanding of recommendations for Nephrologist to help coordinate a renal biopsy, wife repeats back verbal understanding of the after visit summary and recommendations concerning the biopsy           Our next appointment is by telephone on 09/22/22 at 1030 AM  Please call the care guide team at [phone] if you need to cancel or reschedule your appointment.   If you are experiencing a Mental Health or [place] or need someone to talk to, please call 1-800-273-TALK (toll free, 24 hour hotline)  The patient verbalized understanding of instructions, educational materials, and care plan provided today and agreed to receive a mailed copy of  patient instructions, educational materials, and care plan.   Barb Merino, RN, BSN, CCM Care Management Coordinator [facility] Care Management Direct Phone: [phone]

## 2022-08-25 NOTE — Patient Outreach (Signed)
  Care Coordination   Follow Up Visit Note   08/25/2022 Name: Mitchell Coe Sr. MRN: [id number] DOB: 02-23-1951  Mitchell Comber Sr. is a 71 y.o. year old male who sees [name], MD for primary care. I spoke with  Mitchell Comber Sr. And wife Mitchell [name] by phone today.  What matters to the patients health and wellness today?  Patient will consider participation in the [place] program. Patient will need a renal biopsy per Oncology following a recent visit.     Goals Addressed             This Visit's Progress    I would like for him to be more active       Care Coordination Interventions: Evaluation of current treatment plan related to impaired physical activity and patient's adherence to plan as established by provider Determined patient continues to be inactive in the home, he is not performing his HEP per his wife Spoke with patient, educated patient about the provider referral exercise program, he will consider Provided wife with this RN contact number to call if patient would like to participate in the PREP program        Renal biopsy recommended       Care Coordination Interventions: Evaluation of current treatment plan related to MGUS and patient's adherence to plan as established by provider Completed successful outbound call to wife Mitchell [name] Determined patient completed a follow up with Oncology for evaluation of MGUS Review of patient status, including review of consultant's reports, relevant laboratory and other test results, and medications completed Reviewed and discussed wife understanding of recommendations for Nephrologist to help coordinate a renal biopsy, wife repeats back verbal understanding of the after visit summary and recommendations concerning the biopsy           SDOH assessments and interventions completed:  Yes     Care Coordination Interventions:  Yes, provided   Follow up plan: Follow up call scheduled for 09/22/22 '@1030'$  AM    Encounter Outcome:  Pt. Visit  Completed

## 2022-09-07 ENCOUNTER — Other Ambulatory Visit (HOSPITAL_COMMUNITY): Payer: Self-pay | Admitting: Nephrology

## 2022-09-07 ENCOUNTER — Encounter: Payer: Self-pay | Admitting: General Practice

## 2022-09-07 DIAGNOSIS — R809 Proteinuria, unspecified: Secondary | ICD-10-CM

## 2022-09-07 DIAGNOSIS — N184 Chronic kidney disease, stage 4 (severe): Secondary | ICD-10-CM

## 2022-09-07 NOTE — Progress Notes (Signed)
Suttle, Rosanne Ashing, MD  Allen Kell, NT Approved for renal biopsy.  [place]       Previous Messages    ----- Message ----- From: Allen Kell, NT Sent: 09/07/2022  11:46 AM EST To: [place] Procedure Requests Subject: US BIOPSY (KIDNEY)                            Procedure:US BIOPSY (KIDNEY)  Reason: CKD, Proteinuria  History: CT and [place] in chart  Provider: Claudia Desanctis, MD  Contact: [phone]

## 2022-09-18 DIAGNOSIS — Z23 Encounter for immunization: Secondary | ICD-10-CM | POA: Diagnosis not present

## 2022-09-22 ENCOUNTER — Ambulatory Visit: Payer: Self-pay

## 2022-09-22 NOTE — Patient Outreach (Addendum)
  Care Coordination   Follow Up Visit Note   09/22/2022 Name: Mitchell Gum Sr. MRN: [id number] DOB: 1951/01/30  Mitchell Comber Sr. is a 71 y.o. year old male who sees [name], MD for primary care. I reviewed patient's chart today in preparation to contact patient for a nurse care coordination follow up call.   What matters to the patients health and wellness today?      Goals Addressed             This Visit's Progress    COMPLETED: I would like for him to be more active       Care Coordination Interventions: Goal outcome unknown, unable to collaborate with patient at this time due to PCP request to hold Care Coordination services     COMPLETED: Renal biopsy recommended       Care Coordination Interventions: Goal outcome unknown, unable to collaborate with patient at this time due to PCP request to hold Care Coordination services           SDOH assessments and interventions completed:  No     Care Coordination Interventions:  Yes, provided   Follow up plan: No further intervention required.   Encounter Outcome:  Pt. Visit Completed

## 2022-09-28 ENCOUNTER — Other Ambulatory Visit: Payer: Self-pay | Admitting: Radiology

## 2022-09-28 DIAGNOSIS — R809 Proteinuria, unspecified: Secondary | ICD-10-CM

## 2022-09-28 NOTE — H&P (Signed)
Chief Complaint: Patient was seen in consultation today for proteinuria  Referring Physician(s): Claudia Desanctis  Supervising Physician: Sandi Mariscal  Patient Status: [facility] - Out-pt  History of Present Illness: Mitchell [name]. is a 72 y.o. male with history of HTN, prior CVA, HLD, seizures, and IgG lambda monoclonal gammopathy who presents to [facility] Radiology for kidney biopsy at the request of Dr. Royce Macadamia.    Patient presents today in his usual state of health.  He has been NPO.  He does take blood thinners but has held his Xarelto since 12/29.  He aware of the goals of the procedure and is agreeable to proceed.  His wife, Mitchell [name], is available for transportation and care at home.    Past Medical History:  Diagnosis Date   Alcohol abuse    Anemia    Anoxic brain injury ([place])    Chronic pulmonary edema    CVA (cerebral vascular accident) ([place])    Dysphagia    Gout    Hemiplegia affecting left dominant side (HCC)    History of stroke    Hyperlipemia    Hypertension    Seizures ([place])    Stroke ([place])    Thrombocytopenia ([place])     Past Surgical History:  Procedure Laterality Date   lymph nodes     biopsy - benign   RADIOLOGY WITH ANESTHESIA N/A 07/17/2015   Procedure: RADIOLOGY WITH ANESTHESIA;  Surgeon: Medication Radiologist, MD;  Location: [place];  Service: Radiology;  Laterality: N/A;   TRANSURETHRAL RESECTION OF PROSTATE N/A 09/26/2021   Procedure: TRANSURETHRAL RESECTION AND UNROOFING OF PROSTATIC ABSCESS;  Surgeon: Franchot Gallo, MD;  Location: [facility];  Service: Urology;  Laterality: N/A;    Allergies: Hydrochlorothiazide and Pseudoephedrine  Medications: Prior to Admission medications   Medication Sig Start Date End Date Taking? Authorizing Provider  acetaminophen (TYLENOL) 325 MG tablet Take 2 tablets (650 mg total) by mouth every 6 (six) hours as needed for mild pain, moderate pain or fever (or Fever >/= 101). Patient taking differently: Take 325-650 mg by  mouth every 6 (six) hours as needed for mild pain, moderate pain or fever (or Fever >/= 101). 03/09/17  Yes Hongalgi, Lenis Dickinson, MD  allopurinol (ZYLOPRIM) 100 MG tablet Take 100 mg by mouth daily.   Yes [provider]  amLODipine (NORVASC) 10 MG tablet Take 1 tablet (10 mg total) by mouth daily. 08/01/18  Yes Medina-Vargas, Monina C, NP  calcium-vitamin D (OSCAL WITH D) 500-200 MG-UNIT tablet Take 1 tablet by mouth 2 (two) times daily. 08/01/18  Yes Medina-Vargas, Monina C, NP  carvedilol (COREG) 25 MG tablet Take 1 tablet (25 mg total) by mouth 2 (two) times daily with a meal. 08/01/18  Yes Medina-Vargas, Monina C, NP  cetirizine (ZYRTEC) 10 MG tablet Take 10 mg by mouth 2 (two) times daily.   Yes [provider]  cholecalciferol (VITAMIN D3) 25 MCG (1000 UNIT) tablet Take 1,000 Units by mouth daily.   Yes [provider]  ferrous sulfate 325 (65 FE) MG tablet Take 1 tablet (325 mg total) by mouth daily with breakfast. 08/01/18  Yes Medina-Vargas, Monina C, NP  folic acid (FOLVITE) 1 MG tablet Take 1 tablet (1 mg total) by mouth daily. 08/01/18  Yes Medina-Vargas, Monina C, NP  furosemide (LASIX) 20 MG tablet Take 1 tablet (20 mg total) by mouth daily. 09/30/21  Yes Eugenie Filler, MD  Levetiracetam (KEPPRA XR) 750 MG TB24 Take 2 tablets (1,500 mg total) by mouth at  bedtime. 03/23/22  Yes Suzzanne Cloud, NP  multivitamin (PROSIGHT) TABS tablet Take 1 tablet by mouth daily. 07/19/15  Yes Allie Bossier, MD  pantoprazole (PROTONIX) 40 MG tablet Take 1 tablet (40 mg total) by mouth daily. 08/01/18  Yes Medina-Vargas, Monina C, NP  rivaroxaban (XARELTO) 20 MG TABS tablet Take 1 tablet (20 mg total) by mouth daily with supper. 09/28/21  Yes Eugenie Filler, MD  thiamine 100 MG tablet Take 1 tablet (100 mg total) by mouth daily. 08/01/18  Yes Medina-Vargas, Monina C, NP     Family History  Problem Relation Age of Onset   Aneurysm Mother    Heart attack Father    Aneurysm Sister     Breast cancer Sister        diagnosed older than 43 years of age   Clotting disorder Brother     Social History   Socioeconomic History   Marital status: Married    Spouse name: Not on file   Number of children: 2   Years of education: 11th grade   Highest education level: Not on file  Occupational History   Occupation: Retired  Tobacco Use   Smoking status: Never   Smokeless tobacco: Never  Vaping Use   Vaping Use: Never used  Substance and Sexual Activity   Alcohol use: Never    Comment: History of alcohol abuse   Drug use: Never    Types: Marijuana   Sexual activity: Never  Other Topics Concern   Not on file  Social History Narrative   ** Merged History Encounter **       He is currently in rehab following a fall. He is hoping to be discharged home soon.  Lives with his wife at home. Right-handed. Drinks decaf coffee   Social Determinants of Health   Financial Resource Strain: Not on file  Food Insecurity: No Food Insecurity (07/27/2022)   Hunger Vital Sign    Worried About Running Out of Food in the Last Year: Never true    Ran Out of Food in the Last Year: Never true  Transportation Needs: No Transportation Needs (07/27/2022)   PRAPARE - Hydrologist (Medical): No    Lack of Transportation (Non-Medical): No  Physical Activity: Not on file  Stress: Not on file  Social Connections: Not on file     Review of Systems: A 12 point ROS discussed and pertinent positives are indicated in the HPI above.  All other systems are negative.  Review of Systems  Constitutional:  Negative for fatigue and fever.  Respiratory:  Negative for cough and choking.   Cardiovascular:  Negative for chest pain.  Gastrointestinal:  Negative for abdominal pain.  Genitourinary:  Negative for dysuria.  Psychiatric/Behavioral:  Negative for behavioral problems and confusion.     Vital Signs: There were no vitals taken for this visit.  Physical  Exam Vitals and nursing note reviewed.  Constitutional:      General: He is not in acute distress.    Appearance: Normal appearance. He is not ill-appearing.  Cardiovascular:     Rate and Rhythm: Normal rate and regular rhythm.  Pulmonary:     Effort: Pulmonary effort is normal.     Breath sounds: Normal breath sounds.  Abdominal:     General: Abdomen is flat.     Palpations: Abdomen is soft.  Neurological:     General: No focal deficit present.     Mental Status: He is  alert and oriented to person, place, and time. Mental status is at baseline.  Psychiatric:        Mood and Affect: Mood normal.        Behavior: Behavior normal.        Thought Content: Thought content normal.        Judgment: Judgment normal.          Imaging: No results found.  Labs:  CBC: Recent Labs    08/11/22 1534  WBC 6.3  HGB 12.1*  HCT 37.0*  PLT 151    COAGS: No results for input(s): "INR", "APTT" in the last 8760 hours.  BMP: Recent Labs    08/11/22 1534  NA 139  K 4.6  CL 113*  CO2 20*  GLUCOSE 97  BUN 48*  CALCIUM 8.8*  CREATININE 2.72*  GFRNONAA 24*    LIVER FUNCTION TESTS: Recent Labs    08/11/22 1534  BILITOT 0.3  AST 15  ALT 12  ALKPHOS 72  PROT 7.8  ALBUMIN 3.5    TUMOR MARKERS: No results for input(s): "AFPTM", "CEA", "CA199", "CHROMGRNA" in the last 8760 hours.  Assessment and Plan: Patient with past medical history of HTN, IgG lambda monoclonal gammopathy presents with complaint of proteinuria, worsening renal failure.  IR consulted for kidney biopsy at the request of Dr. Royce Macadamia. Case reviewed by Dr. Serafina Royals who approves patient for procedure.  Patient presents today in their usual state of health.  He has been NPO and is not currently on blood thinners as he appropriately held them.  BP 145/81 this AM.   Risks and benefits of kidney biopsy was discussed with the patient and/or patient's family including, but not limited to bleeding, infection,  damage to adjacent structures or low yield requiring additional tests.  All of the questions were answered and there is agreement to proceed.  Consent signed and in chart.   Thank you for this interesting consult.  I greatly enjoyed meeting Janoah Menna Sr. and look forward to participating in their care.  A copy of this report was sent to the requesting provider on this date.  Electronically Signed: Docia Barrier, PA 09/28/2022, 12:32 PM   I spent a total of  30 Minutes   in face to face in clinical consultation, greater than 50% of which was counseling/coordinating care for proteinuria.

## 2022-09-29 ENCOUNTER — Ambulatory Visit (HOSPITAL_COMMUNITY)
Admission: RE | Admit: 2022-09-29 | Discharge: 2022-09-29 | Disposition: A | Discharge status: Home / Self Care | Payer: Medicare Other | Source: Ambulatory Visit | Attending: Nephrology | Admitting: Nephrology

## 2022-09-29 ENCOUNTER — Encounter (HOSPITAL_COMMUNITY): Payer: Self-pay

## 2022-09-29 DIAGNOSIS — N184 Chronic kidney disease, stage 4 (severe): Secondary | ICD-10-CM

## 2022-09-29 DIAGNOSIS — N189 Chronic kidney disease, unspecified: Secondary | ICD-10-CM | POA: Diagnosis not present

## 2022-09-29 DIAGNOSIS — Z7901 Long term (current) use of anticoagulants: Secondary | ICD-10-CM | POA: Insufficient documentation

## 2022-09-29 DIAGNOSIS — R809 Proteinuria, unspecified: Secondary | ICD-10-CM

## 2022-09-29 LAB — CBC
HCT: 35.8 % — ABNORMAL LOW (ref 39.0–52.0)
Hemoglobin: 11.3 g/dL — ABNORMAL LOW (ref 13.0–17.0)
MCH: 28 pg (ref 26.0–34.0)
MCHC: 31.6 g/dL (ref 30.0–36.0)
MCV: 88.8 fL (ref 80.0–100.0)
Platelets: 247 10*3/uL (ref 150–400)
RBC: 4.03 MIL/uL — ABNORMAL LOW (ref 4.22–5.81)
RDW: 15.5 % (ref 11.5–15.5)
WBC: 6.1 10*3/uL (ref 4.0–10.5)
nRBC: 0 % (ref 0.0–0.2)

## 2022-09-29 LAB — PROTIME-INR
INR: 1 (ref 0.8–1.2)
Prothrombin Time: 12.8 seconds (ref 11.4–15.2)

## 2022-09-29 MED ORDER — LIDOCAINE-EPINEPHRINE 1 %-1:100000 IJ SOLN: 8.0000 mL | Freq: Once | INTRAMUSCULAR | Status: DC

## 2022-09-29 MED ORDER — FENTANYL CITRATE (PF) 100 MCG/2ML IJ SOLN
INTRAMUSCULAR | Status: AC
  Filled 2022-09-29: qty 2

## 2022-09-29 MED ORDER — LIDOCAINE-EPINEPHRINE 1 %-1:100000 IJ SOLN
INTRAMUSCULAR | Status: AC
  Filled 2022-09-29: qty 1

## 2022-09-29 MED ORDER — GELATIN ABSORBABLE 12-7 MM EX MISC
CUTANEOUS | Status: AC
  Filled 2022-09-29: qty 1

## 2022-09-29 MED ORDER — MIDAZOLAM HCL 2 MG/2ML IJ SOLN
INTRAMUSCULAR | Status: AC
  Filled 2022-09-29: qty 2

## 2022-09-29 MED ORDER — FENTANYL CITRATE (PF) 100 MCG/2ML IJ SOLN
INTRAMUSCULAR | Status: AC | PRN
  Administered 2022-09-29: 25 ug via INTRAVENOUS

## 2022-09-29 MED ORDER — MIDAZOLAM HCL 2 MG/2ML IJ SOLN
INTRAMUSCULAR | Status: AC | PRN
  Administered 2022-09-29: 1 mg via INTRAVENOUS

## 2022-09-29 MED ORDER — SODIUM CHLORIDE 0.9 % IV SOLN: INTRAVENOUS | Status: DC

## 2022-09-29 NOTE — Procedures (Signed)
Pre Procedure Dx: Proteinuria Post Procedural Dx: Same  Technically successful US guided biopsy of inferior pole of the left kidney.  EBL: Trace No immediate complications.   Ronny Bacon, MD Pager #: [phone]

## 2022-09-29 NOTE — Progress Notes (Signed)
Spoke with Pascal Lux, MD who stated that patient can resume Xarelto on Friday (1/5) if no hematuria. Information put in discharge instructions and Lavella Lemons, RN to inform patient and wife.

## 2022-10-05 ENCOUNTER — Encounter (HOSPITAL_COMMUNITY): Payer: Self-pay

## 2022-10-05 LAB — SURGICAL PATHOLOGY

## 2022-10-06 DIAGNOSIS — I5042 Chronic combined systolic (congestive) and diastolic (congestive) heart failure: Secondary | ICD-10-CM | POA: Diagnosis not present

## 2022-10-06 DIAGNOSIS — N2581 Secondary hyperparathyroidism of renal origin: Secondary | ICD-10-CM | POA: Diagnosis not present

## 2022-10-06 DIAGNOSIS — N184 Chronic kidney disease, stage 4 (severe): Secondary | ICD-10-CM | POA: Diagnosis not present

## 2022-10-06 DIAGNOSIS — D472 Monoclonal gammopathy: Secondary | ICD-10-CM | POA: Diagnosis not present

## 2022-10-06 DIAGNOSIS — R809 Proteinuria, unspecified: Secondary | ICD-10-CM | POA: Diagnosis not present

## 2022-10-06 DIAGNOSIS — I129 Hypertensive chronic kidney disease with stage 1 through stage 4 chronic kidney disease, or unspecified chronic kidney disease: Secondary | ICD-10-CM | POA: Diagnosis not present

## 2022-10-06 DIAGNOSIS — M1A00X0 Idiopathic chronic gout, unspecified site, without tophus (tophi): Secondary | ICD-10-CM | POA: Diagnosis not present

## 2022-10-06 DIAGNOSIS — F1090 Alcohol use, unspecified, uncomplicated: Secondary | ICD-10-CM | POA: Diagnosis not present

## 2022-10-14 ENCOUNTER — Emergency Department (HOSPITAL_COMMUNITY)
Admission: EM | Admit: 2022-10-14 | Discharge: 2022-10-15 | Disposition: A | Discharge status: Home / Self Care | Payer: Medicare Other | Attending: Emergency Medicine | Admitting: Emergency Medicine

## 2022-10-14 ENCOUNTER — Encounter (HOSPITAL_COMMUNITY): Payer: Self-pay | Admitting: Emergency Medicine

## 2022-10-14 ENCOUNTER — Other Ambulatory Visit: Payer: Self-pay

## 2022-10-14 DIAGNOSIS — I10 Essential (primary) hypertension: Secondary | ICD-10-CM | POA: Insufficient documentation

## 2022-10-14 DIAGNOSIS — N179 Acute kidney failure, unspecified: Secondary | ICD-10-CM | POA: Diagnosis not present

## 2022-10-14 DIAGNOSIS — Z79899 Other long term (current) drug therapy: Secondary | ICD-10-CM | POA: Diagnosis not present

## 2022-10-14 DIAGNOSIS — R109 Unspecified abdominal pain: Secondary | ICD-10-CM | POA: Diagnosis present

## 2022-10-14 DIAGNOSIS — D649 Anemia, unspecified: Secondary | ICD-10-CM | POA: Diagnosis not present

## 2022-10-14 DIAGNOSIS — Z8673 Personal history of transient ischemic attack (TIA), and cerebral infarction without residual deficits: Secondary | ICD-10-CM | POA: Diagnosis not present

## 2022-10-14 DIAGNOSIS — Z7901 Long term (current) use of anticoagulants: Secondary | ICD-10-CM | POA: Diagnosis not present

## 2022-10-14 LAB — CBC
HCT: 32.9 % — ABNORMAL LOW (ref 39.0–52.0)
Hemoglobin: 11 g/dL — ABNORMAL LOW (ref 13.0–17.0)
MCH: 28.6 pg (ref 26.0–34.0)
MCHC: 33.4 g/dL (ref 30.0–36.0)
MCV: 85.5 fL (ref 80.0–100.0)
Platelets: 179 10*3/uL (ref 150–400)
RBC: 3.85 MIL/uL — ABNORMAL LOW (ref 4.22–5.81)
RDW: 14.7 % (ref 11.5–15.5)
WBC: 6.4 10*3/uL (ref 4.0–10.5)
nRBC: 0 % (ref 0.0–0.2)

## 2022-10-14 LAB — COMPREHENSIVE METABOLIC PANEL
ALT: 10 U/L (ref 0–44)
AST: 14 U/L — ABNORMAL LOW (ref 15–41)
Albumin: 3 g/dL — ABNORMAL LOW (ref 3.5–5.0)
Alkaline Phosphatase: 59 U/L (ref 38–126)
Anion gap: 11 (ref 5–15)
BUN: 60 mg/dL — ABNORMAL HIGH (ref 8–23)
CO2: 17 mmol/L — ABNORMAL LOW (ref 22–32)
Calcium: 8.7 mg/dL — ABNORMAL LOW (ref 8.9–10.3)
Chloride: 110 mmol/L (ref 98–111)
Creatinine, Ser: 3.55 mg/dL — ABNORMAL HIGH (ref 0.61–1.24)
GFR, Estimated: 17 mL/min — ABNORMAL LOW (ref >60)
Glucose, Bld: 97 mg/dL (ref 70–99)
Potassium: 3.9 mmol/L (ref 3.5–5.1)
Sodium: 138 mmol/L (ref 135–145)
Total Bilirubin: 0.3 mg/dL (ref 0.3–1.2)
Total Protein: 7.4 g/dL (ref 6.5–8.1)

## 2022-10-14 LAB — URINALYSIS, ROUTINE W REFLEX MICROSCOPIC
Bacteria, UA: NONE SEEN
Bilirubin Urine: NEGATIVE
Glucose, UA: NEGATIVE mg/dL
Hgb urine dipstick: NEGATIVE
Ketones, ur: NEGATIVE mg/dL
Leukocytes,Ua: NEGATIVE
Nitrite: NEGATIVE
Protein, ur: >=300 mg/dL — AB
Specific Gravity, Urine: 1.013 (ref 1.005–1.030)
pH: 5 (ref 5.0–8.0)

## 2022-10-14 LAB — LIPASE, BLOOD: Lipase: 42 U/L (ref 11–51)

## 2022-10-14 LAB — TROPONIN I (HIGH SENSITIVITY)
Troponin I (High Sensitivity): 13 ng/L (ref <18)
Troponin I (High Sensitivity): 11 ng/L (ref <18)

## 2022-10-14 LAB — POC OCCULT BLOOD, ED: Fecal Occult Bld: NEGATIVE

## 2022-10-14 LAB — TYPE AND SCREEN
ABO/RH(D): B POS
Antibody Screen: NEGATIVE

## 2022-10-14 MED ORDER — SODIUM CHLORIDE 0.9 % IV BOLUS
500.0000 mL | Freq: Once | INTRAVENOUS | Status: AC
  Administered 2022-10-15: 500 mL via INTRAVENOUS

## 2022-10-14 NOTE — ED Provider Triage Note (Signed)
Emergency Medicine Provider Triage Evaluation Note  Mitchell Dines Sr. , a 72 y.o. male  was evaluated in triage.  Pt complains of an episode of black stools last night. He was sent by his PCP for evaluation. He reports some upper abdominal pain with some nausea, without vomiting. Also had an episode of lighteheadedness this AM. No chest pain or SOB. He is on an iron pill. Denies any h/o GI bleeding. No hematochezia.  Review of Systems  Positive:  Negative:   Physical Exam  BP (!) 148/86 (BP Location: Right Arm)   Pulse 79   Temp 98.2 F (36.8 C) (Oral)   Resp 16   Ht '5\' 8"'$  (1.727 m)   Wt 77.1 kg   SpO2 99%   BMI 25.85 kg/m  Gen:   Awake, no distress   Resp:  Normal effort  MSK:   Moves extremities without difficulty  Other:  Abdomen non tender to palpation. Soft.  Medical Decision Making  Medically screening exam initiated at 6:37 PM.  Appropriate orders placed.  Floreen Comber Sr. was informed that the remainder of the evaluation will be completed by another provider, this initial triage assessment does not replace that evaluation, and the importance of remaining in the ED until their evaluation is complete.  Labs ordered.    Sherrell Puller, PA-C 10/14/22 1840

## 2022-10-14 NOTE — ED Triage Notes (Addendum)
Pt c/o 1 black stool last night. Pt states he called his PCP and was told to come here for eval. Endorses mild abdominal pain and nausea without vomitting. Endorses some intermittent dizziness since last night.

## 2022-10-15 DIAGNOSIS — N179 Acute kidney failure, unspecified: Secondary | ICD-10-CM | POA: Diagnosis not present

## 2022-10-15 NOTE — Discharge Instructions (Signed)
Your lab work reveals that your kidney function has slightly increased, I have given you some fluids here in the emergency department, I would like you to withhold your Lasix tomorrow, please continue with your Lasix starting back on Monday.  I would encourage you to follow-up with your nephrologist or your primary care doctor as I want them to recheck your kidney functions to ensure that they are not getting worse.  Your dark tarry stools did not show any evidence of blood, but I do want to remind you that you are on a blood thinner which does increase your risk of a GI bleed, if you note any bloody stools or worsening black/tarry looking stools you need to be reassessed either at your primary care doctor and/or the emergency department.  Come back to the emergency department if you develop chest pain, shortness of breath, severe abdominal pain, uncontrolled nausea, vomiting, diarrhea.

## 2022-10-15 NOTE — ED Provider Notes (Signed)
[facility] EMERGENCY DEPARTMENT Provider Note   CSN: [id number] Arrival date & time: 10/14/22  1756     History  Chief Complaint  Patient presents with   Melena   Abdominal Pain    Mitchell [name] Sr. is a 72 y.o. male.  HPI   Medical history including hypertension, CVA, DVT on Xarelto, seizures presents to the Emergency Department with complaints of dark tarry stools.  Patient states that on Wednesday he had 1 episode of dark tarry stools, he states that he has not had any since, he states he has not had a bowel movement today.  He has no history of GI bleeds, he is on Xarelto, he denies excessive alcohol use or NSAID use, he has no associated stomach pains.  He denies any chest pain shortness of breath feeling lightheaded or dizziness.  He currently has no other complaints.  He states that he told his primary care doctor that he was told to come here for further evaluation.    Home Medications Prior to Admission medications   Medication Sig Start Date End Date Taking? Authorizing Provider  acetaminophen (TYLENOL) 325 MG tablet Take 2 tablets (650 mg total) by mouth every 6 (six) hours as needed for mild pain, moderate pain or fever (or Fever >/= 101). Patient taking differently: Take 325-650 mg by mouth every 6 (six) hours as needed for mild pain, moderate pain or fever (or Fever >/= 101). 03/09/17  Yes Hongalgi, Lenis Dickinson, MD  allopurinol (ZYLOPRIM) 100 MG tablet Take 100 mg by mouth daily.   Yes [provider]  amLODipine (NORVASC) 10 MG tablet Take 1 tablet (10 mg total) by mouth daily. 08/01/18  Yes Medina-Vargas, Monina C, NP  calcium-vitamin D (OSCAL WITH D) 500-200 MG-UNIT tablet Take 1 tablet by mouth 2 (two) times daily. 08/01/18  Yes Medina-Vargas, Monina C, NP  carvedilol (COREG) 25 MG tablet Take 1 tablet (25 mg total) by mouth 2 (two) times daily with a meal. 08/01/18  Yes Medina-Vargas, Monina C, NP  cetirizine (ZYRTEC) 10 MG tablet Take 10 mg by  mouth 2 (two) times daily.   Yes [provider]  cholecalciferol (VITAMIN D3) 25 MCG (1000 UNIT) tablet Take 1,000 Units by mouth daily.   Yes [provider]  ferrous sulfate 325 (65 FE) MG tablet Take 1 tablet (325 mg total) by mouth daily with breakfast. 08/01/18  Yes Medina-Vargas, Monina C, NP  folic acid (FOLVITE) 1 MG tablet Take 1 tablet (1 mg total) by mouth daily. 08/01/18  Yes Medina-Vargas, Monina C, NP  furosemide (LASIX) 20 MG tablet Take 1 tablet (20 mg total) by mouth daily. Patient taking differently: Take 20 mg by mouth every Monday, Wednesday, and Friday. 09/30/21  Yes Eugenie Filler, MD  Levetiracetam (KEPPRA XR) 750 MG TB24 Take 2 tablets (1,500 mg total) by mouth at bedtime. 03/23/22  Yes Suzzanne Cloud, NP  losartan (COZAAR) 25 MG tablet Take 25 mg by mouth daily. 10/06/22  Yes [provider]  multivitamin (PROSIGHT) TABS tablet Take 1 tablet by mouth daily. 07/19/15  Yes Allie Bossier, MD  pantoprazole (PROTONIX) 40 MG tablet Take 1 tablet (40 mg total) by mouth daily. 08/01/18  Yes Medina-Vargas, Monina C, NP  rivaroxaban (XARELTO) 20 MG TABS tablet Take 1 tablet (20 mg total) by mouth daily with supper. 09/28/21  Yes Eugenie Filler, MD  sodium bicarbonate 650 MG tablet Take 650 mg by mouth 2 (two) times daily. 10/12/22  Yes  [provider]  thiamine 100 MG tablet Take 1 tablet (100 mg total) by mouth daily. 08/01/18  Yes Medina-Vargas, Monina C, NP      Allergies    Hydrochlorothiazide and Pseudoephedrine    Review of Systems   Review of Systems  Constitutional:  Negative for chills and fever.  Respiratory:  Negative for shortness of breath.   Cardiovascular:  Negative for chest pain.  Gastrointestinal:  Negative for abdominal pain.       Black looking stool  Neurological:  Negative for headaches.    Physical Exam Updated Vital Signs BP 130/65   Pulse 67   Temp 98.4 F (36.9 C) (Oral)   Resp 12   Ht '5\' 8"'$  (1.727 m)    Wt 77.1 kg   SpO2 99%   BMI 25.85 kg/m  Physical Exam Vitals and nursing note reviewed. Exam conducted with a chaperone present.  Constitutional:      General: He is not in acute distress.    Appearance: He is not ill-appearing.  HENT:     Head: Normocephalic and atraumatic.     Nose: No congestion.  Eyes:     Conjunctiva/sclera: Conjunctivae normal.  Cardiovascular:     Rate and Rhythm: Normal rate and regular rhythm.     Pulses: Normal pulses.     Heart sounds: No murmur heard.    No friction rub. No gallop.  Pulmonary:     Effort: No respiratory distress.     Breath sounds: No wheezing, rhonchi or rales.     Comments: No acute respiratory distress lung sounds clear bilaterally Abdominal:     Palpations: Abdomen is soft.     Tenderness: There is no abdominal tenderness. There is no right CVA tenderness or left CVA tenderness.  Genitourinary:    Comments: Chaperone present rectum was visualized there is no noted bloody stool, no rectal discharge, no external hemorrhoids present, digital rectal exam was performed, there is no palpable mass, he had good sphincter tone, stool was dark looking but no frank melena or hematochezia. Musculoskeletal:     Comments: No peripheral edema  Skin:    General: Skin is warm and dry.  Neurological:     Mental Status: He is alert.  Psychiatric:        Mood and Affect: Mood normal.     ED Results / Procedures / Treatments   Labs (all labs ordered are listed, but only abnormal results are displayed) Labs Reviewed  COMPREHENSIVE METABOLIC PANEL - Abnormal; Notable for the following components:      Result Value   CO2 17 (*)    BUN 60 (*)    Creatinine, Ser 3.55 (*)    Calcium 8.7 (*)    Albumin 3.0 (*)    AST 14 (*)    GFR, Estimated 17 (*)    All other components within normal limits  CBC - Abnormal; Notable for the following components:   RBC 3.85 (*)    Hemoglobin 11.0 (*)    HCT 32.9 (*)    All other components within normal  limits  URINALYSIS, ROUTINE W REFLEX MICROSCOPIC - Abnormal; Notable for the following components:   Protein, ur >=300 (*)    All other components within normal limits  LIPASE, BLOOD  POC OCCULT BLOOD, ED  TYPE AND SCREEN  TROPONIN I (HIGH SENSITIVITY)  TROPONIN I (HIGH SENSITIVITY)    EKG EKG Interpretation  Date/Time:  Thursday October 14 2022 22:52:55 EST Ventricular Rate:  63 PR  Interval:  221 QRS Duration: 112 QT Interval:  436 QTC Calculation: 447 R Axis:   141 Text Interpretation: Right and left arm electrode reversal, interpretation assumes no reversal Sinus or ectopic atrial rhythm Ventricular premature complex Prolonged PR interval Consider left atrial enlargement Consider left ventricular hypertrophy Probable lateral infarct, age indeterminate Confirmed by Merrily Pew [phone]) on 10/14/2022 11:25:00 PM  Radiology No results found.  Procedures Procedures    Medications Ordered in ED Medications  sodium chloride 0.9 % bolus 500 mL (500 mLs Intravenous New Bag/Given 10/15/22 0020)    ED Course/ Medical Decision Making/ A&P                             Medical Decision Making Amount and/or Complexity of Data Reviewed Labs: ordered.   This patient presents to the ED for concern of tarry stool, this involves an extensive number of treatment options, and is a complaint that carries with it a high risk of complications and morbidity.  The differential diagnosis includes GI bleed, diverticulitis, bowel obstruction, anemia    Additional history obtained:  Additional history obtained from wife at bedside External records from outside source obtained and reviewed including PCP notes   Co morbidities that complicate the patient evaluation  On anticoag's  Social Determinants of Health:  Geriatric    Lab Tests:  I Ordered, and personally interpreted labs.  The pertinent results include: CBC shows normocytic anemia hemoglobin 11.0, at baseline for patient,  CMP shows CO2 of 17, BUN of 60, creatinine 3.5 baseline appears to be 2.7, GFR 17, negative delta troponin, UA unremarkable, Hemoccult negative   Imaging Studies ordered:  I ordered imaging studies including N/A I independently visualized and interpreted imaging which showed N/A I agree with the radiologist interpretation   Cardiac Monitoring:  The patient was maintained on a cardiac monitor.  I personally viewed and interpreted the cardiac monitored which showed an underlying rhythm of: EKG without signs of ischemia   Medicines ordered and prescription drug management:  I ordered medication including fluid I have reviewed the patients home medicines and have made adjustments as needed  Critical Interventions:  N/A   Reevaluation:  Presents with tarry stools, triage obtain basic lab work and imaging which I personally reviewed, lab work shows slightly worsening AKI, performed Hemoccult which was negative, due to patient's slightly increased AKI, will provide him with fluids, he is not volume overloaded on my exam.  Patient will be discharged afterwards.  He is agreement this plan.    Consultations Obtained:  N/A    Test Considered:  CT AP-deferred as abdomen soft nontender he has a nonsurgical abdomen making my suspicion for abdominal abnormality low at this time.    Rule out Suspicion for a GI bleed is very low at this time as his hemoglobin is at his baseline, he is nontachycardic, hemodynamically stable, his Hemoccult is negative.  I doubt diverticulitis or perforated ulcer as he has a nonsurgical abdomen vital signs are reassuring he is nontoxic-appearing. I have low suspicion for liver or gallbladder abnormality as she has no right upper quadrant tenderness, liver enzymes, alk phos, T bili all within normal limits.  Low suspicion for pancreatitis as lipase is within normal limits.   Low suspicion for bowel obstruction as abdomen is nondistended normal bowel sounds, so  passing gas and having normal bowel movements.  Low suspicion for complicated diverticulitis as she is nontoxic-appearing, vital signs reassuring no leukocytosis  present.  Low suspicion for appendicitis as she has no right lower quadrant tenderness, vital signs reassuring.    Dispostion and problem list  After consideration of the diagnostic results and the patients response to treatment, I feel that the patent would benefit from discharge.  AKI-possible from overdiuresis, patient takes Lasix Monday Wednesday Friday, will have him discontinue Lasix Friday and resume him back on Monday, he was given 500 cc of fluids here in the emergency department.  Will have him follow-up with his primary care doctor/nephrologist for further evaluation. Dark stools -unclear significance, Hemoccult is negative, he is hemodynamically stable, he is on Xarelto, I explained to them that he is at increased risk for a GI bleed, I explained the need to monitor his bowel movements if he notes any bloody stools or worsening dark tarry stools he needs to be reassessed.          Final Clinical Impression(s) / ED Diagnoses Final diagnoses:  AKI (acute kidney injury) [facility])    Rx / [place] Orders ED Discharge Orders     None         Marcello Fennel, PA-C 10/15/22 0102    Maudie Flakes, MD 10/15/22 [phone]

## 2022-10-18 ENCOUNTER — Telehealth: Payer: Self-pay | Admitting: Physician Assistant

## 2022-10-18 NOTE — Telephone Encounter (Signed)
I called and spoke to [name] and his wife to review the final recommendations after undergoing kidney biopsy on 09/29/2022. 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 There was no evidence of paraprotein-related renal disease.   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 No indication for bone marrow biopsy at this time. We will monitor and see him back in 6 months for repeat labs. Patient and his wife expressed understanding of the plan provided.

## 2022-10-26 DIAGNOSIS — I129 Hypertensive chronic kidney disease with stage 1 through stage 4 chronic kidney disease, or unspecified chronic kidney disease: Secondary | ICD-10-CM | POA: Diagnosis not present

## 2022-10-26 DIAGNOSIS — I5042 Chronic combined systolic (congestive) and diastolic (congestive) heart failure: Secondary | ICD-10-CM | POA: Diagnosis not present

## 2022-10-26 DIAGNOSIS — N183 Chronic kidney disease, stage 3 unspecified: Secondary | ICD-10-CM | POA: Diagnosis not present

## 2022-10-26 DIAGNOSIS — N184 Chronic kidney disease, stage 4 (severe): Secondary | ICD-10-CM | POA: Diagnosis not present

## 2022-11-22 DIAGNOSIS — I69354 Hemiplegia and hemiparesis following cerebral infarction affecting left non-dominant side: Secondary | ICD-10-CM | POA: Diagnosis not present

## 2022-11-22 DIAGNOSIS — N1832 Chronic kidney disease, stage 3b: Secondary | ICD-10-CM | POA: Diagnosis not present

## 2022-11-22 DIAGNOSIS — D472 Monoclonal gammopathy: Secondary | ICD-10-CM | POA: Diagnosis not present

## 2022-11-22 DIAGNOSIS — H18329 Folds in Descemet's membrane, unspecified eye: Secondary | ICD-10-CM | POA: Diagnosis not present

## 2022-11-22 DIAGNOSIS — I5032 Chronic diastolic (congestive) heart failure: Secondary | ICD-10-CM | POA: Diagnosis not present

## 2022-11-22 DIAGNOSIS — I10 Essential (primary) hypertension: Secondary | ICD-10-CM | POA: Diagnosis not present

## 2022-11-22 DIAGNOSIS — Z7901 Long term (current) use of anticoagulants: Secondary | ICD-10-CM | POA: Diagnosis not present

## 2022-12-22 DIAGNOSIS — R3912 Poor urinary stream: Secondary | ICD-10-CM | POA: Diagnosis not present

## 2022-12-22 DIAGNOSIS — N401 Enlarged prostate with lower urinary tract symptoms: Secondary | ICD-10-CM | POA: Diagnosis not present

## 2023-01-05 DIAGNOSIS — F1090 Alcohol use, unspecified, uncomplicated: Secondary | ICD-10-CM | POA: Diagnosis not present

## 2023-01-05 DIAGNOSIS — E872 Acidosis, unspecified: Secondary | ICD-10-CM | POA: Diagnosis not present

## 2023-01-05 DIAGNOSIS — D472 Monoclonal gammopathy: Secondary | ICD-10-CM | POA: Diagnosis not present

## 2023-01-05 DIAGNOSIS — I129 Hypertensive chronic kidney disease with stage 1 through stage 4 chronic kidney disease, or unspecified chronic kidney disease: Secondary | ICD-10-CM | POA: Diagnosis not present

## 2023-01-05 DIAGNOSIS — N2581 Secondary hyperparathyroidism of renal origin: Secondary | ICD-10-CM | POA: Diagnosis not present

## 2023-01-05 DIAGNOSIS — R809 Proteinuria, unspecified: Secondary | ICD-10-CM | POA: Diagnosis not present

## 2023-01-05 DIAGNOSIS — N184 Chronic kidney disease, stage 4 (severe): Secondary | ICD-10-CM | POA: Diagnosis not present

## 2023-01-05 DIAGNOSIS — I5042 Chronic combined systolic (congestive) and diastolic (congestive) heart failure: Secondary | ICD-10-CM | POA: Diagnosis not present

## 2023-01-05 DIAGNOSIS — M1A00X0 Idiopathic chronic gout, unspecified site, without tophus (tophi): Secondary | ICD-10-CM | POA: Diagnosis not present

## 2023-01-20 DIAGNOSIS — M150 Primary generalized (osteo)arthritis: Secondary | ICD-10-CM | POA: Diagnosis not present

## 2023-01-20 DIAGNOSIS — Z8673 Personal history of transient ischemic attack (TIA), and cerebral infarction without residual deficits: Secondary | ICD-10-CM | POA: Diagnosis not present

## 2023-01-20 DIAGNOSIS — M25569 Pain in unspecified knee: Secondary | ICD-10-CM | POA: Diagnosis not present

## 2023-01-20 DIAGNOSIS — M1A09X1 Idiopathic chronic gout, multiple sites, with tophus (tophi): Secondary | ICD-10-CM | POA: Diagnosis not present

## 2023-01-20 DIAGNOSIS — Z79899 Other long term (current) drug therapy: Secondary | ICD-10-CM | POA: Diagnosis not present

## 2023-01-20 DIAGNOSIS — M25551 Pain in right hip: Secondary | ICD-10-CM | POA: Diagnosis not present

## 2023-01-20 DIAGNOSIS — M549 Dorsalgia, unspecified: Secondary | ICD-10-CM | POA: Diagnosis not present

## 2023-01-20 DIAGNOSIS — N1832 Chronic kidney disease, stage 3b: Secondary | ICD-10-CM | POA: Diagnosis not present

## 2023-01-20 DIAGNOSIS — M25572 Pain in left ankle and joints of left foot: Secondary | ICD-10-CM | POA: Diagnosis not present

## 2023-01-25 DIAGNOSIS — H40023 Open angle with borderline findings, high risk, bilateral: Secondary | ICD-10-CM | POA: Diagnosis not present

## 2023-01-25 DIAGNOSIS — H25812 Combined forms of age-related cataract, left eye: Secondary | ICD-10-CM | POA: Diagnosis not present

## 2023-01-25 DIAGNOSIS — Z961 Presence of intraocular lens: Secondary | ICD-10-CM | POA: Diagnosis not present

## 2023-01-25 DIAGNOSIS — I639 Cerebral infarction, unspecified: Secondary | ICD-10-CM | POA: Diagnosis not present

## 2023-01-25 DIAGNOSIS — H53462 Homonymous bilateral field defects, left side: Secondary | ICD-10-CM | POA: Diagnosis not present

## 2023-02-10 ENCOUNTER — Telehealth: Payer: Self-pay | Admitting: Physician Assistant

## 2023-02-11 ENCOUNTER — Inpatient Hospital Stay: Payer: Medicare Other

## 2023-02-11 ENCOUNTER — Inpatient Hospital Stay: Payer: Medicare Other | Admitting: Physician Assistant

## 2023-02-14 DIAGNOSIS — G40909 Epilepsy, unspecified, not intractable, without status epilepticus: Secondary | ICD-10-CM | POA: Diagnosis not present

## 2023-02-14 DIAGNOSIS — Z7901 Long term (current) use of anticoagulants: Secondary | ICD-10-CM | POA: Diagnosis not present

## 2023-02-14 DIAGNOSIS — Z6825 Body mass index (BMI) 25.0-25.9, adult: Secondary | ICD-10-CM | POA: Diagnosis not present

## 2023-02-14 DIAGNOSIS — I5032 Chronic diastolic (congestive) heart failure: Secondary | ICD-10-CM | POA: Diagnosis not present

## 2023-02-14 DIAGNOSIS — N1832 Chronic kidney disease, stage 3b: Secondary | ICD-10-CM | POA: Diagnosis not present

## 2023-02-14 DIAGNOSIS — D472 Monoclonal gammopathy: Secondary | ICD-10-CM | POA: Diagnosis not present

## 2023-02-14 DIAGNOSIS — I10 Essential (primary) hypertension: Secondary | ICD-10-CM | POA: Diagnosis not present

## 2023-02-14 DIAGNOSIS — M1009 Idiopathic gout, multiple sites: Secondary | ICD-10-CM | POA: Diagnosis not present

## 2023-02-14 DIAGNOSIS — I69354 Hemiplegia and hemiparesis following cerebral infarction affecting left non-dominant side: Secondary | ICD-10-CM | POA: Diagnosis not present

## 2023-02-28 ENCOUNTER — Other Ambulatory Visit: Payer: Self-pay | Admitting: Physician Assistant

## 2023-02-28 DIAGNOSIS — D472 Monoclonal gammopathy: Secondary | ICD-10-CM

## 2023-03-01 ENCOUNTER — Telehealth: Payer: Self-pay

## 2023-03-01 ENCOUNTER — Inpatient Hospital Stay (HOSPITAL_BASED_OUTPATIENT_CLINIC_OR_DEPARTMENT_OTHER): Payer: Medicare Other | Admitting: Physician Assistant

## 2023-03-01 ENCOUNTER — Inpatient Hospital Stay: Payer: Medicare Other | Attending: Physician Assistant

## 2023-03-01 ENCOUNTER — Other Ambulatory Visit: Payer: Self-pay

## 2023-03-01 DIAGNOSIS — N189 Chronic kidney disease, unspecified: Secondary | ICD-10-CM | POA: Insufficient documentation

## 2023-03-01 DIAGNOSIS — Z803 Family history of malignant neoplasm of breast: Secondary | ICD-10-CM | POA: Diagnosis not present

## 2023-03-01 DIAGNOSIS — D472 Monoclonal gammopathy: Secondary | ICD-10-CM

## 2023-03-01 DIAGNOSIS — D649 Anemia, unspecified: Secondary | ICD-10-CM | POA: Insufficient documentation

## 2023-03-01 LAB — CBC WITH DIFFERENTIAL (CANCER CENTER ONLY)
Abs Immature Granulocytes: 0.01 10*3/uL (ref 0.00–0.07)
Basophils Absolute: 0 10*3/uL (ref 0.0–0.1)
Basophils Relative: 0 %
Eosinophils Absolute: 0.1 10*3/uL (ref 0.0–0.5)
Eosinophils Relative: 1 %
HCT: 29.2 % — ABNORMAL LOW (ref 39.0–52.0)
Hemoglobin: 9.9 g/dL — ABNORMAL LOW (ref 13.0–17.0)
Immature Granulocytes: 0 %
Lymphocytes Relative: 23 %
Lymphs Abs: 1 10*3/uL (ref 0.7–4.0)
MCH: 28.3 pg (ref 26.0–34.0)
MCHC: 33.9 g/dL (ref 30.0–36.0)
MCV: 83.4 fL (ref 80.0–100.0)
Monocytes Absolute: 0.3 10*3/uL (ref 0.1–1.0)
Monocytes Relative: 6 %
Neutro Abs: 3 10*3/uL (ref 1.7–7.7)
Neutrophils Relative %: 70 %
Platelet Count: 206 10*3/uL (ref 150–400)
RBC: 3.5 MIL/uL — ABNORMAL LOW (ref 4.22–5.81)
RDW: 15.9 % — ABNORMAL HIGH (ref 11.5–15.5)
WBC Count: 4.3 10*3/uL (ref 4.0–10.5)
nRBC: 0 % (ref 0.0–0.2)

## 2023-03-01 LAB — CMP (CANCER CENTER ONLY)
ALT: 6 U/L (ref 0–44)
AST: 12 U/L — ABNORMAL LOW (ref 15–41)
Albumin: 3.6 g/dL (ref 3.5–5.0)
Alkaline Phosphatase: 74 U/L (ref 38–126)
Anion gap: 7 (ref 5–15)
BUN: 65 mg/dL — ABNORMAL HIGH (ref 8–23)
CO2: 21 mmol/L — ABNORMAL LOW (ref 22–32)
Calcium: 8.6 mg/dL — ABNORMAL LOW (ref 8.9–10.3)
Chloride: 110 mmol/L (ref 98–111)
Creatinine: 3.41 mg/dL — ABNORMAL HIGH (ref 0.61–1.24)
GFR, Estimated: 18 mL/min — ABNORMAL LOW (ref >60)
Glucose, Bld: 103 mg/dL — ABNORMAL HIGH (ref 70–99)
Potassium: 4.7 mmol/L (ref 3.5–5.1)
Sodium: 138 mmol/L (ref 135–145)
Total Bilirubin: 0.3 mg/dL (ref 0.3–1.2)
Total Protein: 7.6 g/dL (ref 6.5–8.1)

## 2023-03-01 NOTE — Telephone Encounter (Signed)
Call to Dr Vallery Sa at [facility]. to advise of pt's changing creatinine level.  He was last seen by Dr Malen Gauze on 4/10 and his next appt is 7/9.  Today's labs and office note faxed to Dr Malen Gauze.  Confirmation received

## 2023-03-01 NOTE — Progress Notes (Signed)
[facility] [phone]   [phone]  PROGRESS NOTE  Patient Care Team: Mirna Mires, MD as PCP - General (Family Medicine) Alwyn Ren Titus Dubin, MD as Consulting Physician (Internal Medicine) Mirna Mires, MD (Family Medicine) Clarene Duke Karma Lew, RN as [facility] Care Management  Hematological/Oncological History 06/29/2022: Labs from nephrologist, Dr. Vallery Sa: SPEP detected M spike measuring 0.3 g/dL. IFE showed IgG monoclonal protein with lambda light chain specificity. Kappa 66.1, Lambda 94.3, Ratio 0.70. Creatinine 2.49, Calcium 9.0, Hgb 11.4. 08/11/2022: Establish care with [facility] Hematology with Dr. Jeanie Sewer and Georga Kaufmann PA-C 09/29/2022: Kidney biopsy reveled severe chronic injury compatible with vascular etiology. No evidence of paraprotein-related renal disease.   CHIEF COMPLAINTS/PURPOSE OF CONSULTATION:  IgG Lambda MGUS   HISTORY OF PRESENTING ILLNESS:  Mitchell Birks Sr. 72 y.o. 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 He is accompanied by his wife for this visit.   On exam today, Mitchell [name] reports that overall he is stable without any new or concerning changes to his health. His energy is stable and he denies any weight loss. He denies any new back or bone pain. He uses a cane to ambulate and wheel chair for long distances.  He denies easy bruising or signs of active bleeding.  He denies fevers, chills, sweats, shortness of breath, chest pain or cough. He has no other complaints. Rest of the 10 point ROS is below.   MEDICAL HISTORY:  Past Medical History:  Diagnosis Date   Alcohol abuse    Anemia    Anoxic brain injury (HCC)    Chronic pulmonary edema    CVA (cerebral vascular accident) (HCC)    Dysphagia    Gout    Hemiplegia affecting left dominant side (HCC)    History of stroke    Hyperlipemia    Hypertension    Seizures (HCC)    Stroke (HCC)    Thrombocytopenia (HCC)     SURGICAL HISTORY: Past  Surgical History:  Procedure Laterality Date   lymph nodes     biopsy - benign   RADIOLOGY WITH ANESTHESIA N/A 07/17/2015   Procedure: RADIOLOGY WITH ANESTHESIA;  Surgeon: Medication Radiologist, MD;  Location: [facility] OR;  Service: Radiology;  Laterality: N/A;   TRANSURETHRAL RESECTION OF PROSTATE N/A 09/26/2021   Procedure: TRANSURETHRAL RESECTION AND UNROOFING OF PROSTATIC ABSCESS;  Surgeon: Marcine Matar, MD;  Location: [facility] ORS;  Service: Urology;  Laterality: N/A;    SOCIAL HISTORY: Social History   Socioeconomic History   Marital status: Married    Spouse name: Not on file   Number of children: 2   Years of education: 11th grade   Highest education level: Not on file  Occupational History   Occupation: Retired  Tobacco Use   Smoking status: Never   Smokeless tobacco: Never  Vaping Use   Vaping Use: Never used  Substance and Sexual Activity   Alcohol use: Never    Comment: History of alcohol abuse   Drug use: Never    Types: Marijuana   Sexual activity: Never  Other Topics Concern   Not on file  Social History Narrative   ** Merged History Encounter **       He is currently in rehab following a fall. He is hoping to be discharged home soon.  Lives with his wife at home. Right-handed. Drinks decaf coffee   Social Determinants of Corporate investment banker Strain: Not on file  Food Insecurity: No Food  Insecurity (07/27/2022)   Hunger Vital Sign    Worried About Running Out of Food in the Last Year: Never true    Ran Out of Food in the Last Year: Never true  Transportation Needs: No Transportation Needs (07/27/2022)   PRAPARE - Administrator, Civil Service (Medical): No    Lack of Transportation (Non-Medical): No  Physical Activity: Not on file  Stress: Not on file  Social Connections: Not on file  Intimate Partner Violence: Not on file    FAMILY HISTORY: Family History  Problem Relation Age of Onset   Aneurysm Mother    Heart attack  Father    Aneurysm Sister    Breast cancer Sister        diagnosed older than 64 years of age   Clotting disorder Brother     ALLERGIES:  is allergic to hydrochlorothiazide and pseudoephedrine.  MEDICATIONS:  Current Outpatient Medications  Medication Sig Dispense Refill   acetaminophen (TYLENOL) 325 MG tablet Take 2 tablets (650 mg total) by mouth every 6 (six) hours as needed for mild pain, moderate pain or fever (or Fever >/= 101). (Patient taking differently: Take 325-650 mg by mouth every 6 (six) hours as needed for mild pain, moderate pain or fever (or Fever >/= 101).)     allopurinol (ZYLOPRIM) 100 MG tablet Take 100 mg by mouth daily.     amLODipine (NORVASC) 10 MG tablet Take 1 tablet (10 mg total) by mouth daily. 30 tablet 0   calcium-vitamin D (OSCAL WITH D) 500-200 MG-UNIT tablet Take 1 tablet by mouth 2 (two) times daily. 60 tablet 0   carvedilol (COREG) 25 MG tablet Take 1 tablet (25 mg total) by mouth 2 (two) times daily with a meal. 60 tablet 0   cetirizine (ZYRTEC) 10 MG tablet Take 10 mg by mouth 2 (two) times daily.     cholecalciferol (VITAMIN D3) 25 MCG (1000 UNIT) tablet Take 1,000 Units by mouth daily.     ferrous sulfate 325 (65 FE) MG tablet Take 1 tablet (325 mg total) by mouth daily with breakfast. 30 tablet 0   folic acid (FOLVITE) 1 MG tablet Take 1 tablet (1 mg total) by mouth daily. 30 tablet 0   furosemide (LASIX) 20 MG tablet Take 1 tablet (20 mg total) by mouth daily. (Patient taking differently: Take 20 mg by mouth every Monday, Wednesday, and Friday.) 30 tablet 0   Levetiracetam (KEPPRA XR) 750 MG TB24 Take 2 tablets (1,500 mg total) by mouth at bedtime. 180 tablet 4   losartan (COZAAR) 25 MG tablet Take 25 mg by mouth daily.     multivitamin (PROSIGHT) TABS tablet Take 1 tablet by mouth daily. 30 each 0   pantoprazole (PROTONIX) 40 MG tablet Take 1 tablet (40 mg total) by mouth daily. 30 tablet 0   rivaroxaban (XARELTO) 20 MG TABS tablet Take 1 tablet  (20 mg total) by mouth daily with supper. 30 tablet 0   sodium bicarbonate 650 MG tablet Take 650 mg by mouth 2 (two) times daily.     thiamine 100 MG tablet Take 1 tablet (100 mg total) by mouth daily. 30 tablet 0   No current facility-administered medications for this visit.    REVIEW OF SYSTEMS:   Constitutional: ( - ) fevers, ( - )  chills , ( - ) night sweats Eyes: ( - ) blurriness of vision, ( - ) double vision, ( - ) watery eyes Ears, nose, mouth, throat, and face: ( - )  mucositis, ( - ) sore throat Respiratory: ( - ) cough, ( - ) dyspnea, ( - ) wheezes Cardiovascular: ( - ) palpitation, ( - ) chest discomfort, ( - ) lower extremity swelling Gastrointestinal:  ( - ) nausea, ( - ) heartburn, ( - ) change in bowel habits Skin: ( - ) abnormal skin rashes Lymphatics: ( - ) new lymphadenopathy, ( - ) easy bruising Neurological: ( - ) numbness, ( - ) tingling, ( - ) new weaknesses Behavioral/Psych: ( - ) mood change, ( - ) new changes  All other systems were reviewed with the patient and are negative.  PHYSICAL EXAMINATION: ECOG PERFORMANCE STATUS: 1 - Symptomatic but completely ambulatory  There were no vitals filed for this visit.  There were no vitals filed for this visit.   GENERAL: well appearing male in NAD  SKIN: skin color, texture, turgor are normal, no rashes or significant lesions EYES: conjunctiva are pink and non-injected, sclera clear LUNGS: clear to auscultation and percussion with normal breathing effort HEART: regular rate & rhythm and no murmurs and no lower extremity edema Musculoskeletal: no cyanosis of digits and no clubbing  PSYCH: alert & oriented x 3, fluent speech NEURO: no focal motor/sensory deficits  LABORATORY DATA:  I have reviewed the data as listed    Latest Ref Rng & Units 03/01/2023   12:44 PM 10/14/2022    6:30 PM 09/29/2022    6:44 AM  CBC  WBC 4.0 - 10.5 K/uL 4.3  6.4  6.1   Hemoglobin 13.0 - 17.0 g/dL 9.9  16.1  09.6   Hematocrit 39.0 -  52.0 % 29.2  32.9  35.8   Platelets 150 - 400 K/uL 206  179  247        Latest Ref Rng & Units 03/01/2023   12:44 PM 10/14/2022    6:30 PM 08/11/2022    3:34 PM  CMP  Glucose 70 - 99 mg/dL 045  97  97   BUN 8 - 23 mg/dL 65  60  48   Creatinine 0.61 - 1.24 mg/dL 4.09  8.11  9.14   Sodium 135 - 145 mmol/L 138  138  139   Potassium 3.5 - 5.1 mmol/L 4.7  3.9  4.6   Chloride 98 - 111 mmol/L 110  110  113   CO2 22 - 32 mmol/L 21  17  20    Calcium 8.9 - 10.3 mg/dL 8.6  8.7  8.8   Total Protein 6.5 - 8.1 g/dL 7.6  7.4  7.8   Total Bilirubin 0.3 - 1.2 mg/dL 0.3  0.3  0.3   Alkaline Phos 38 - 126 U/L 74  59  72   AST 15 - 41 U/L 12  14  15    ALT 0 - 44 U/L 6  10  12      ASSESSMENT & PLAN Mitchell Birks Sr. Is a 72 y.o. male who returns to the clinic for MGUS.    #IgG Lambda MGUS: --Underwent kidney biopsy on 09/29/2022. Path revealed severe chronic injury compatible with vascular etiology. No evidence of paraprotein-related renal disease.  --Bone met survey from 08/13/2022 was negative for focal lytic lesions. Repeat annual with next one due around November 2024.  --Labs today reviewed with patient and wife. WBC 4.3, Hgb 9.9, Plt 206K, Creatinine 3.41, calcium 8.6. SPEP/IFE, sFLC and 24 hour UPEP pending --RTC in 6 months with labs unless further intervention is required.   #CKD: #Anemia: --Under the care of Dr.  Vallery Sa. Creatinine is higher than baseline, will request follow up with nephrology.  --Consider epo injections if Hgb is consistently < 10.   No orders of the defined types were placed in this encounter.   All questions were answered. The patient knows to call the clinic with any problems, questions or concerns.  I have spent a total of 25 minutes minutes of face-to-face and non-face-to-face time, preparing to see the patient, performing a medically appropriate examination, counseling and educating the patient, ordering tests/procedures, documenting clinical information in the  electronic health record, and care coordination.   Georga Kaufmann, PA-C Department of Hematology/Oncology [facility] Phone: [phone]

## 2023-03-02 ENCOUNTER — Telehealth: Payer: Self-pay | Admitting: Physician Assistant

## 2023-03-02 LAB — KAPPA/LAMBDA LIGHT CHAINS
Kappa free light chain: 113 mg/L — ABNORMAL HIGH (ref 3.3–19.4)
Kappa, lambda light chain ratio: 0.69 (ref 0.26–1.65)
Lambda free light chains: 164.7 mg/L — ABNORMAL HIGH (ref 5.7–26.3)

## 2023-03-03 ENCOUNTER — Other Ambulatory Visit: Payer: Self-pay | Admitting: *Deleted

## 2023-03-03 DIAGNOSIS — D649 Anemia, unspecified: Secondary | ICD-10-CM | POA: Diagnosis not present

## 2023-03-03 DIAGNOSIS — D472 Monoclonal gammopathy: Secondary | ICD-10-CM

## 2023-03-03 DIAGNOSIS — Z803 Family history of malignant neoplasm of breast: Secondary | ICD-10-CM | POA: Diagnosis not present

## 2023-03-03 DIAGNOSIS — N189 Chronic kidney disease, unspecified: Secondary | ICD-10-CM | POA: Diagnosis not present

## 2023-03-07 LAB — MULTIPLE MYELOMA PANEL, SERUM
Albumin SerPl Elph-Mcnc: 3.1 g/dL (ref 2.9–4.4)
Albumin/Glob SerPl: 0.9 (ref 0.7–1.7)
Alpha 1: 0.2 g/dL (ref 0.0–0.4)
Alpha2 Glob SerPl Elph-Mcnc: 0.9 g/dL (ref 0.4–1.0)
B-Globulin SerPl Elph-Mcnc: 1.5 g/dL — ABNORMAL HIGH (ref 0.7–1.3)
Gamma Glob SerPl Elph-Mcnc: 1.1 g/dL (ref 0.4–1.8)
Globulin, Total: 3.7 g/dL (ref 2.2–3.9)
IgA: 465 mg/dL — ABNORMAL HIGH (ref 61–437)
IgG (Immunoglobin G), Serum: 1453 mg/dL (ref 603–1613)
IgM (Immunoglobulin M), Srm: 90 mg/dL (ref 15–143)
M Protein SerPl Elph-Mcnc: 0.4 g/dL — ABNORMAL HIGH
Total Protein ELP: 6.8 g/dL (ref 6.0–8.5)

## 2023-03-08 LAB — UPEP/UIFE/LIGHT CHAINS/TP, 24-HR UR
% BETA, Urine: 13.1 %
ALPHA 1 URINE: 3.5 %
Albumin, U: 73.1 %
Alpha 2, Urine: 3.8 %
Free Kappa Lt Chains,Ur: 24.61 mg/L (ref 1.17–86.46)
Free Kappa/Lambda Ratio: 1.14 — ABNORMAL LOW (ref 1.83–14.26)
Free Lambda Lt Chains,Ur: 21.62 mg/L — ABNORMAL HIGH (ref 0.27–15.21)
GAMMA GLOBULIN URINE: 6.6 %
M-SPIKE %, Urine: 4.2 % — ABNORMAL HIGH
M-Spike, Mg/24 Hr: 39 mg/24 hr — ABNORMAL HIGH
Total Protein, Urine-Ur/day: 933 mg/24 hr — ABNORMAL HIGH (ref 30–150)
Total Protein, Urine: 84.8 mg/dL
Total Volume: 1100

## 2023-03-10 ENCOUNTER — Telehealth: Payer: Self-pay

## 2023-03-10 NOTE — Telephone Encounter (Signed)
-----   Message from Briant Cedar, PA-C sent at 03/10/2023  1:04 PM EDT ----- Please notify patient that protein levels are overall stable. Continue to monitor and we will see him back in 6 months.    ----- Message ----- From: Interface, Lab In [place] Sent: 03/01/2023   1:09 PM EDT To: Briant Cedar, PA-C

## 2023-03-10 NOTE — Telephone Encounter (Signed)
Pt advised with VU 

## 2023-03-23 NOTE — Progress Notes (Unsigned)
PATIENT: Mitchell Farquharson Sr. DOB: Dec 31, 1950  REASON FOR VISIT: follow up HISTORY FROM: patient, wife  Primary Neurologist: Dr. Terrace Arabia for seizures, Dr. Frances Furbish for sleep   HISTORY Copied Dr. Teofilo Pod note 10/28/20: I saw your patient, Mitchell [name], upon your kind request in the sleep clinic today for initial consultation of his sleep disorder, in particular, concern for underlying obstructive sleep apnea.  The patient is accompanied by his wife today. As you know, Mitchell [name] is a 72 year old right-handed gentleman with an underlying medical history of stroke, seizure disorder, hyperlipidemia, hypertension, thrombocytopenia, anemia, alcohol use disorder, gout, and overweight state, who reports snoring and excessive daytime somnolence.  I reviewed your office note from 09/23/2020.  His Epworth sleepiness score is 5 out of 24, which may be an underestimation.  His wife reports that he tends to stay in bed most of the day, he denies it.  He reports a bedtime of around 10 PM and rise time of around 8 or 9 AM.  He is not aware of any family history of sleep apnea.  He would be willing to come in stay overnight for sleep testing.  He denies recurrent morning headaches.  He has nocturia about 3 times per average night.  He quit drinking alcohol in 2019, he quit smoking in the 70s.  He drinks no caffeine on a daily basis, decaf coffee.  Update 03/23/21 SS: Sleep study, showed OSA, but sleep quality was poor. Sleep apnea was in mild range, showed EKG abnormalities (PVCs), hasn't seen PCP. Says today not interested in starting CPAP. Remains on Keppra, doing well, no seizures. Got brace to left hand for finger flexion, most of the time he doesn't wear, but is today. Most of sleep issue is drowsiness during the day.   Update March 23, 2022 SS: here with his wife, still on Keppra XR 750 mg 2 tablets at bedtime, tolerating well. No falls, using cane. Still has daytime drowsiness, takes naps. Sleeps well, not interested in CPAP. Not  wearing left hand brace. No new issues. Looks good today.   Update March 24, 2023 SS: No seizures, remains on Keppra XR 750 mg, 2 tablets at bedtime. Sleeps fairly well at night, does have itching occasionally. Naps during the day, mostly watches TV. Does some walking in the house. Trouble with left hand use. More spasticity to left hand, 4th finger in flexion under the 3rd, also flexion at the elbow.  REVIEW OF SYSTEMS: Out of a complete 14 system review of symptoms, the patient complains only of the following symptoms, and all other reviewed systems are negative.  See HPI  ALLERGIES: Allergies  Allergen Reactions   Hydrochlorothiazide     precipitates gout   Pseudoephedrine Rash    HOME MEDICATIONS: Outpatient Medications Prior to Visit  Medication Sig Dispense Refill   acetaminophen (TYLENOL) 325 MG tablet Take 2 tablets (650 mg total) by mouth every 6 (six) hours as needed for mild pain, moderate pain or fever (or Fever >/= 101). (Patient taking differently: Take 325-650 mg by mouth every 6 (six) hours as needed for mild pain, moderate pain or fever (or Fever >/= 101).)     allopurinol (ZYLOPRIM) 100 MG tablet Take 100 mg by mouth daily.     amLODipine (NORVASC) 10 MG tablet Take 1 tablet (10 mg total) by mouth daily. 30 tablet 0   calcium-vitamin D (OSCAL WITH D) 500-200 MG-UNIT tablet Take 1 tablet by mouth 2 (two) times daily. 60 tablet 0  carvedilol (COREG) 25 MG tablet Take 1 tablet (25 mg total) by mouth 2 (two) times daily with a meal. 60 tablet 0   cetirizine (ZYRTEC) 10 MG tablet Take 10 mg by mouth 2 (two) times daily.     cholecalciferol (VITAMIN D3) 25 MCG (1000 UNIT) tablet Take 1,000 Units by mouth daily.     ferrous sulfate 325 (65 FE) MG tablet Take 1 tablet (325 mg total) by mouth daily with breakfast. 30 tablet 0   folic acid (FOLVITE) 1 MG tablet Take 1 tablet (1 mg total) by mouth daily. 30 tablet 0   furosemide (LASIX) 20 MG tablet Take 1 tablet (20 mg total) by  mouth daily. (Patient taking differently: Take 20 mg by mouth every Monday, Wednesday, and Friday.) 30 tablet 0   losartan (COZAAR) 25 MG tablet Take 25 mg by mouth daily.     multivitamin (PROSIGHT) TABS tablet Take 1 tablet by mouth daily. 30 each 0   pantoprazole (PROTONIX) 40 MG tablet Take 1 tablet (40 mg total) by mouth daily. 30 tablet 0   rivaroxaban (XARELTO) 20 MG TABS tablet Take 1 tablet (20 mg total) by mouth daily with supper. 30 tablet 0   sodium bicarbonate 650 MG tablet Take 650 mg by mouth 2 (two) times daily.     thiamine 100 MG tablet Take 1 tablet (100 mg total) by mouth daily. 30 tablet 0   Levetiracetam (KEPPRA XR) 750 MG TB24 Take 2 tablets (1,500 mg total) by mouth at bedtime. 180 tablet 4   No facility-administered medications prior to visit.    PAST MEDICAL HISTORY: Past Medical History:  Diagnosis Date   Alcohol abuse    Anemia    Anoxic brain injury (HCC)    Chronic pulmonary edema    CVA (cerebral vascular accident) (HCC)    Dysphagia    Gout    Hemiplegia affecting left dominant side (HCC)    History of stroke    Hyperlipemia    Hypertension    Seizures (HCC)    Stroke (HCC)    Thrombocytopenia (HCC)     PAST SURGICAL HISTORY: Past Surgical History:  Procedure Laterality Date   lymph nodes     biopsy - benign   RADIOLOGY WITH ANESTHESIA N/A 07/17/2015   Procedure: RADIOLOGY WITH ANESTHESIA;  Surgeon: Medication Radiologist, MD;  Location: [facility] OR;  Service: Radiology;  Laterality: N/A;   TRANSURETHRAL RESECTION OF PROSTATE N/A 09/26/2021   Procedure: TRANSURETHRAL RESECTION AND UNROOFING OF PROSTATIC ABSCESS;  Surgeon: Marcine Matar, MD;  Location: [facility] ORS;  Service: Urology;  Laterality: N/A;    FAMILY HISTORY: Family History  Problem Relation Age of Onset   Aneurysm Mother    Heart attack Father    Aneurysm Sister    Breast cancer Sister        diagnosed older than 73 years of age   Clotting disorder Brother     SOCIAL  HISTORY: Social History   Socioeconomic History   Marital status: Married    Spouse name: Not on file   Number of children: 2   Years of education: 11th grade   Highest education level: Not on file  Occupational History   Occupation: Retired  Tobacco Use   Smoking status: Never   Smokeless tobacco: Never  Vaping Use   Vaping Use: Never used  Substance and Sexual Activity   Alcohol use: Never    Comment: History of alcohol abuse   Drug use: Never    Types:  Marijuana   Sexual activity: Never  Other Topics Concern   Not on file  Social History Narrative   ** Merged History Encounter **       He is currently in rehab following a fall. He is hoping to be discharged home soon.  Lives with his wife at home. Right-handed. Drinks decaf coffee   Social Determinants of Health   Financial Resource Strain: Not on file  Food Insecurity: No Food Insecurity (07/27/2022)   Hunger Vital Sign    Worried About Running Out of Food in the Last Year: Never true    Ran Out of Food in the Last Year: Never true  Transportation Needs: No Transportation Needs (07/27/2022)   PRAPARE - Administrator, Civil Service (Medical): No    Lack of Transportation (Non-Medical): No  Physical Activity: Not on file  Stress: Not on file  Social Connections: Not on file  Intimate Partner Violence: Not on file   PHYSICAL EXAM  Vitals:   03/24/23 1306 03/24/23 1311  BP: (!) 145/74 (!) 150/73  Pulse: 67   Resp: 14   Weight: 170 lb (77.1 kg)    Body mass index is 25.85 kg/m.  Generalized: Well developed, in no acute distress  Neurological examination  Mentation: Alert oriented to time, place, history taking. Follows all commands speech and language fluent Cranial nerve II-XII: Pupils were equal round reactive to light. Extraocular movements were full, visual field were full on confrontational test. Facial sensation and strength were normal. Head turning and shoulder shrug  were normal and  symmetric. Motor: Spastic left hemiparesis 3/5 left upper and lower extremity, left 3 & 4 finger in flexion Sensory: Decreased soft touch sensation to left upper extremity Coordination: Spastic with left upper finger nose finger Gait and station: Not ambulated today   DIAGNOSTIC DATA (LABS, IMAGING, TESTING) - I reviewed patient records, labs, notes, testing and imaging myself where available.  Lab Results  Component Value Date   WBC 4.3 03/01/2023   HGB 9.9 (L) 03/01/2023   HCT 29.2 (L) 03/01/2023   MCV 83.4 03/01/2023   PLT 206 03/01/2023      Component Value Date/Time   NA 138 03/01/2023 1244   NA 141 06/27/2018 0000   K 4.7 03/01/2023 1244   CL 110 03/01/2023 1244   CO2 21 (L) 03/01/2023 1244   GLUCOSE 103 (H) 03/01/2023 1244   BUN 65 (H) 03/01/2023 1244   BUN 22 (A) 06/27/2018 0000   CREATININE 3.41 (H) 03/01/2023 1244   CALCIUM 8.6 (L) 03/01/2023 1244   PROT 7.6 03/01/2023 1244   ALBUMIN 3.6 03/01/2023 1244   AST 12 (L) 03/01/2023 1244   ALT 6 03/01/2023 1244   ALKPHOS 74 03/01/2023 1244   BILITOT 0.3 03/01/2023 1244   GFRNONAA 18 (L) 03/01/2023 1244   GFRAA 40 (L) 06/17/2018 1140   Lab Results  Component Value Date   CHOL 165 06/08/2018   HDL 86 06/08/2018   LDLCALC 62 06/08/2018   TRIG 87 06/08/2018   CHOLHDL 1.9 06/08/2018   Lab Results  Component Value Date   HGBA1C 5.2 06/08/2018   No results found for: "VITAMINB12" No results found for: "TSH"  ASSESSMENT AND PLAN 72 y.o. year old male  has a past medical history of Alcohol abuse, Anemia, Anoxic brain injury (HCC), Chronic pulmonary edema, CVA (cerebral vascular accident) (HCC), Dysphagia, Gout, Hemiplegia affecting left dominant side (HCC), History of stroke, Hyperlipemia, Hypertension, Seizures (HCC), Stroke (HCC), and Thrombocytopenia (HCC). here  with:  1.  History of right parietal lobe infarction, with residual spastic left hemiparesis 2.  History of heavy alcohol abuse 3.  Partial status  epilepticus -Doing well, no recurrent spells since 2019 -Continue Keppra XR 750 mg, 2 tablets at bedtime -EEG showed background slowing, no epileptiform discharge -Referral to Dr. Terrace Arabia for Xeomin injection for left arm (shoulder/elbow) and hand spasticity   4. OSA  -Has decided not to pursue CPAP, discussed could benefit daytime drowsiness -Sleep study showed mild OSA, but was poor quality sleep  Margie Ege, AGNP-C, DNP 03/24/2023, 1:36 PM [facility] [address] [place], [place] [address] [phone]

## 2023-03-24 ENCOUNTER — Ambulatory Visit (INDEPENDENT_AMBULATORY_CARE_PROVIDER_SITE_OTHER): Payer: Medicare Other | Admitting: Neurology

## 2023-03-24 ENCOUNTER — Telehealth: Payer: Self-pay

## 2023-03-24 ENCOUNTER — Encounter: Payer: Self-pay | Admitting: Neurology

## 2023-03-24 VITALS — BP 150/73 | HR 67 | Resp 14 | Wt 170.0 lb

## 2023-03-24 DIAGNOSIS — I6300 Cerebral infarction due to thrombosis of unspecified precerebral artery: Secondary | ICD-10-CM

## 2023-03-24 DIAGNOSIS — G40909 Epilepsy, unspecified, not intractable, without status epilepticus: Secondary | ICD-10-CM | POA: Diagnosis not present

## 2023-03-24 DIAGNOSIS — I69354 Hemiplegia and hemiparesis following cerebral infarction affecting left non-dominant side: Secondary | ICD-10-CM

## 2023-03-24 MED ORDER — LEVETIRACETAM ER 750 MG PO TB24: 1500.0000 mg | ORAL_TABLET | Freq: Every day | ORAL | 4 refills | Status: DC

## 2023-03-24 NOTE — Patient Instructions (Signed)
Continue Keppra for seizure prevention  Will have Dr. Terrace Arabia do Xeomin for left arm spasticity

## 2023-03-24 NOTE — Telephone Encounter (Signed)
Needs xeomin auth. New start.   Hemiparesis affecting left side as late effect of CVA I69.354    Xeomin [id number] Units:300

## 2023-03-24 NOTE — Telephone Encounter (Signed)
Medicare A&B does not require prior auth. I called pt's supplement, [employer], and the pre-recorded greeting informed me that prior certification is not needed for services. Buy and bill is required for Medicare.   I called pt to schedule first appt, home # voicemail box was full. I left a voicemail for cell #. I will also mail a letter asking him to call and schedule appt. If he returns call, please schedule first available with Dr. Terrace Arabia for Xeomin and add to the wait list.

## 2023-03-28 NOTE — Telephone Encounter (Signed)
Pt called scheduled Botox appt 05/18/23 at 2 pm

## 2023-04-05 DIAGNOSIS — M1A00X0 Idiopathic chronic gout, unspecified site, without tophus (tophi): Secondary | ICD-10-CM | POA: Diagnosis not present

## 2023-04-05 DIAGNOSIS — N184 Chronic kidney disease, stage 4 (severe): Secondary | ICD-10-CM | POA: Diagnosis not present

## 2023-04-05 DIAGNOSIS — E872 Acidosis, unspecified: Secondary | ICD-10-CM | POA: Diagnosis not present

## 2023-04-05 DIAGNOSIS — I129 Hypertensive chronic kidney disease with stage 1 through stage 4 chronic kidney disease, or unspecified chronic kidney disease: Secondary | ICD-10-CM | POA: Diagnosis not present

## 2023-04-05 DIAGNOSIS — N2581 Secondary hyperparathyroidism of renal origin: Secondary | ICD-10-CM | POA: Diagnosis not present

## 2023-04-05 DIAGNOSIS — I5042 Chronic combined systolic (congestive) and diastolic (congestive) heart failure: Secondary | ICD-10-CM | POA: Diagnosis not present

## 2023-04-05 DIAGNOSIS — D472 Monoclonal gammopathy: Secondary | ICD-10-CM | POA: Diagnosis not present

## 2023-04-05 DIAGNOSIS — R809 Proteinuria, unspecified: Secondary | ICD-10-CM | POA: Diagnosis not present

## 2023-04-05 DIAGNOSIS — D631 Anemia in chronic kidney disease: Secondary | ICD-10-CM | POA: Diagnosis not present

## 2023-04-21 DIAGNOSIS — E875 Hyperkalemia: Secondary | ICD-10-CM | POA: Diagnosis not present

## 2023-05-18 ENCOUNTER — Ambulatory Visit: Payer: Medicare Other | Admitting: Neurology

## 2023-05-18 ENCOUNTER — Encounter: Payer: Self-pay | Admitting: Neurology

## 2023-05-18 VITALS — BP 161/83 | HR 107

## 2023-05-18 DIAGNOSIS — I6300 Cerebral infarction due to thrombosis of unspecified precerebral artery: Secondary | ICD-10-CM | POA: Diagnosis not present

## 2023-05-18 DIAGNOSIS — I69354 Hemiplegia and hemiparesis following cerebral infarction affecting left non-dominant side: Secondary | ICD-10-CM | POA: Diagnosis not present

## 2023-05-18 MED ORDER — ONABOTULINUMTOXINA 100 UNITS IJ SOLR
300.0000 [IU] | Freq: Once | INTRAMUSCULAR | Status: AC
Start: 2023-05-18 — End: 2023-05-18
  Administered 2023-05-18: 300 [IU] via INTRAMUSCULAR

## 2023-05-18 NOTE — Progress Notes (Signed)
Botox- 100 units x 3 vial Lot: [id number] Expiration: 11.2026 NDC: 0023-3921-02  Bacteriostatic 0.9% Sodium Chloride- 6 mL  Lot: [id number] Expiration: 04.01.2025 NDC: 0102725366  Dx: I63.00  B/B Witnessed by Cinda Quest, CMA

## 2023-05-18 NOTE — Progress Notes (Signed)
Chief Complaint  Patient presents with   Procedure    Rm14,  Pt is well and ready for injection      ASSESSMENT AND PLAN  Mitchell Villafuerte Sr. is a 72 y.o. 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   MEDICAL HISTORY:  Mitchell [name]. is a 72 year old male, seen in request by Mitchell [name] for evaluation of botulism toxin injection for spastic left upper extremity, his primary care physician is Dr. Loleta [name], Mitchell [name],  I reviewed and summarized the referring note. Stroke Seizure Hypertension Hyperlipidemia Alcohol use Gout Severe osteoarthritis Obstructive sleep apnea History of anoxic brain injury  He had a history of stroke in the past, which caused spastic left hemiparesis, along with his severe arthritis, deformity of joints, this has pose significant difficulty in his daily function, gait abnormality, hide left shoulders, deformed bilateral hands joints, limited elbow range of motion, painful on passive stretch, hope to receive botulism injection to alleviate the pain  At home, he can walk short distance without assistant  Personally reviewed CT head without contrast, in October 2022 significant atrophy, small vessel disease, CT cervical spine, acute fracture of the anterior inferior corner of C5 vertebral body, mild  prevertebral edema, chronic fusion at C2-3, C4-5  MRI of the brain in September 2019, chronic infarction involving right parietal lobe, mild small vessel disease, negative MRA of the brain   PHYSICAL EXAM:   Vitals:   05/18/23 1334 05/18/23 1344  BP: (!) 143/77 (!) 161/83  Pulse:  (!) 107   There is no height or weight on file to calculate BMI.  PHYSICAL EXAMNIATION:  Gen: NAD, conversant, well nourised, well groomed                     Cardiovascular: Regular rate rhythm, no peripheral edema, warm, nontender. Eyes: Conjunctivae clear without exudates or hemorrhage Neck: Supple, no carotid bruits. Pulmonary: Clear to auscultation bilaterally   NEUROLOGICAL EXAM:  MENTAL STATUS: Speech/cognition: Awake, alert, oriented to history taking and casual conversation CRANIAL NERVES: CN II: Visual fields are full to confrontation. Pupils are round equal and briskly reactive to light. CN III, IV, VI: extraocular movement are normal. No ptosis. CN V: Facial sensation is intact to light touch CN VII: Left lower face weakness CN VIII: Hearing is normal to causal conversation. CN IX, X: Phonation is normal. CN XI: Head turning and shoulder shrug are intact  MOTOR: Deformity of bilateral hands joints, contraction of left elbow, maximum left elbow extension 170 degree, limited range of motion of left shoulder, tendency for left wrist flexion, finger flexion, even with passive stretch, he can no longer reach full range of motion on the deformed fingers,  REFLEXES: Hyperreflexia of left upper and lower extremity  SENSORY: Intact to light touch,    COORDINATION: There is no trunk or limb dysmetria noted.  GAIT/STANCE: Need push-up to  get up from seated position, cautious wide-based, holding left arm in elbow flexion, wrist flexion,  REVIEW OF SYSTEMS:  Full 14 system review of systems performed and notable only for as above All other review of systems were  negative.   ALLERGIES: Allergies  Allergen Reactions   Hydrochlorothiazide     precipitates gout   Pseudoephedrine Rash    HOME MEDICATIONS: Current Outpatient Medications  Medication Sig Dispense Refill   acetaminophen (TYLENOL) 325 MG tablet Take 2 tablets (650 mg total) by mouth every 6 (six) hours as needed for mild pain, moderate pain or fever (or Fever >/= 101). (Patient taking differently: Take 325-650 mg by mouth every 6 (six) hours as needed for mild pain, moderate pain or fever (or Fever >/= 101).)     allopurinol (ZYLOPRIM) 100 MG tablet Take 100 mg by mouth daily.     amLODipine (NORVASC) 10 MG tablet Take 1 tablet (10 mg total) by mouth daily. 30 tablet 0   calcium-vitamin D (OSCAL WITH D) 500-200 MG-UNIT tablet Take 1 tablet by mouth 2 (two) times daily. 60 tablet 0   carvedilol (COREG) 25 MG tablet Take 1 tablet (25 mg total) by mouth 2 (two) times daily with a meal. 60 tablet 0   cetirizine (ZYRTEC) 10 MG tablet Take 10 mg by mouth 2 (two) times daily.     cholecalciferol (VITAMIN D3) 25 MCG (1000 UNIT) tablet Take 1,000 Units by mouth daily.     ferrous sulfate 325 (65 FE) MG tablet Take 1 tablet (325 mg total) by mouth daily with breakfast. 30 tablet 0   folic acid (FOLVITE) 1 MG tablet Take 1 tablet (1 mg total) by mouth daily. 30 tablet 0   furosemide (LASIX) 20 MG tablet Take 1 tablet (20 mg total) by mouth daily. (Patient taking differently: Take 20 mg by mouth every Monday, Wednesday, and Friday.) 30 tablet 0   levETIRAcetam (KEPPRA XR) 750 MG 24 hr tablet Take 2 tablets (1,500 mg total) by mouth at bedtime. 180 tablet 4   losartan (COZAAR) 25 MG tablet Take 25 mg by mouth daily.     multivitamin (PROSIGHT) TABS tablet Take 1 tablet by mouth daily. 30 each 0   pantoprazole (PROTONIX) 40 MG tablet Take 1 tablet (40 mg total) by mouth daily. 30 tablet 0   rivaroxaban (XARELTO) 20 MG TABS tablet Take 1 tablet (20 mg total) by mouth daily with supper. 30 tablet 0    sodium bicarbonate 650 MG tablet Take 650 mg by mouth 2 (two) times daily.     thiamine 100 MG tablet Take 1 tablet (100 mg total) by mouth daily. 30 tablet 0   Current Facility-Administered Medications  Medication Dose Route Frequency Provider Last Rate Last Admin   botulinum toxin Type A (BOTOX) injection 300 Units  300 Units Intramuscular Once Levert Feinstein, MD        PAST MEDICAL HISTORY: Past Medical History:  Diagnosis Date   Alcohol abuse    Anemia    Anoxic brain injury (HCC)    Chronic pulmonary edema    CVA (cerebral vascular accident) (HCC)    Dysphagia    Gout    Hemiplegia affecting left dominant side (HCC)    History of stroke    Hyperlipemia    Hypertension    Seizures (HCC)    Stroke (HCC)    Thrombocytopenia (HCC)     PAST SURGICAL HISTORY: Past Surgical History:  Procedure Laterality Date   lymph nodes  biopsy - benign   RADIOLOGY WITH ANESTHESIA N/A 07/17/2015   Procedure: RADIOLOGY WITH ANESTHESIA;  Surgeon: Medication Radiologist, MD;  Location: [facility];  Service: Radiology;  Laterality: N/A;   TRANSURETHRAL RESECTION OF PROSTATE N/A 09/26/2021   Procedure: TRANSURETHRAL RESECTION AND UNROOFING OF PROSTATIC ABSCESS;  Surgeon: Marcine Matar, MD;  Location: [facility];  Service: Urology;  Laterality: N/A;    FAMILY HISTORY: Family History  Problem Relation Age of Onset   Aneurysm Mother    Heart attack Father    Aneurysm Sister    Breast cancer Sister        diagnosed older than 39 years of age   Clotting disorder Brother     SOCIAL HISTORY: Social History   Socioeconomic History   Marital status: Married    Spouse name: Not on file   Number of children: 2   Years of education: 11th grade   Highest education level: Not on file  Occupational History   Occupation: Retired  Tobacco Use   Smoking status: Never   Smokeless tobacco: Never  Vaping Use   Vaping status: Never Used  Substance and Sexual Activity   Alcohol use: Never     Comment: History of alcohol abuse   Drug use: Never    Types: Marijuana   Sexual activity: Never  Other Topics Concern   Not on file  Social History Narrative   ** Merged History Encounter **       He is currently in rehab following a fall. He is hoping to be discharged home soon.  Lives with his wife at home. Right-handed. Drinks decaf coffee   Social Determinants of Health   Financial Resource Strain: Not on file  Food Insecurity: No Food Insecurity (07/27/2022)   Hunger Vital Sign    Worried About Running Out of Food in the Last Year: Never true    Ran Out of Food in the Last Year: Never true  Transportation Needs: No Transportation Needs (07/27/2022)   PRAPARE - Administrator, Civil Service (Medical): No    Lack of Transportation (Non-Medical): No  Physical Activity: Not on file  Stress: Not on file  Social Connections: Not on file  Intimate Partner Violence: Not on file      Levert Feinstein, M.D. Ph.D.  [facility] [address], [address] [place], [place] [address] Ph: [phone] Fax: [phone]  CC:  Mirna Mires, MD [address] [place],  [place] [address]  Mirna Mires, MD

## 2023-05-24 DIAGNOSIS — I10 Essential (primary) hypertension: Secondary | ICD-10-CM | POA: Diagnosis not present

## 2023-05-24 DIAGNOSIS — N184 Chronic kidney disease, stage 4 (severe): Secondary | ICD-10-CM | POA: Diagnosis not present

## 2023-05-24 DIAGNOSIS — D631 Anemia in chronic kidney disease: Secondary | ICD-10-CM | POA: Diagnosis not present

## 2023-05-24 DIAGNOSIS — M1A00X0 Idiopathic chronic gout, unspecified site, without tophus (tophi): Secondary | ICD-10-CM | POA: Diagnosis not present

## 2023-05-24 DIAGNOSIS — D472 Monoclonal gammopathy: Secondary | ICD-10-CM | POA: Diagnosis not present

## 2023-05-24 DIAGNOSIS — I69354 Hemiplegia and hemiparesis following cerebral infarction affecting left non-dominant side: Secondary | ICD-10-CM | POA: Diagnosis not present

## 2023-05-24 DIAGNOSIS — N1832 Chronic kidney disease, stage 3b: Secondary | ICD-10-CM | POA: Diagnosis not present

## 2023-05-24 DIAGNOSIS — Z7901 Long term (current) use of anticoagulants: Secondary | ICD-10-CM | POA: Diagnosis not present

## 2023-05-24 DIAGNOSIS — R7309 Other abnormal glucose: Secondary | ICD-10-CM | POA: Diagnosis not present

## 2023-05-24 DIAGNOSIS — I5032 Chronic diastolic (congestive) heart failure: Secondary | ICD-10-CM | POA: Diagnosis not present

## 2023-05-24 DIAGNOSIS — E782 Mixed hyperlipidemia: Secondary | ICD-10-CM | POA: Diagnosis not present

## 2023-05-24 DIAGNOSIS — M1009 Idiopathic gout, multiple sites: Secondary | ICD-10-CM | POA: Diagnosis not present

## 2023-05-24 DIAGNOSIS — G40909 Epilepsy, unspecified, not intractable, without status epilepticus: Secondary | ICD-10-CM | POA: Diagnosis not present

## 2023-05-24 DIAGNOSIS — R569 Unspecified convulsions: Secondary | ICD-10-CM | POA: Diagnosis not present

## 2023-06-28 DIAGNOSIS — N184 Chronic kidney disease, stage 4 (severe): Secondary | ICD-10-CM | POA: Diagnosis not present

## 2023-07-06 DIAGNOSIS — I5042 Chronic combined systolic (congestive) and diastolic (congestive) heart failure: Secondary | ICD-10-CM | POA: Diagnosis not present

## 2023-07-06 DIAGNOSIS — N2581 Secondary hyperparathyroidism of renal origin: Secondary | ICD-10-CM | POA: Diagnosis not present

## 2023-07-06 DIAGNOSIS — N184 Chronic kidney disease, stage 4 (severe): Secondary | ICD-10-CM | POA: Diagnosis not present

## 2023-07-06 DIAGNOSIS — E872 Acidosis, unspecified: Secondary | ICD-10-CM | POA: Diagnosis not present

## 2023-07-06 DIAGNOSIS — D631 Anemia in chronic kidney disease: Secondary | ICD-10-CM | POA: Diagnosis not present

## 2023-07-06 DIAGNOSIS — R809 Proteinuria, unspecified: Secondary | ICD-10-CM | POA: Diagnosis not present

## 2023-07-06 DIAGNOSIS — M1A00X0 Idiopathic chronic gout, unspecified site, without tophus (tophi): Secondary | ICD-10-CM | POA: Diagnosis not present

## 2023-07-06 DIAGNOSIS — D472 Monoclonal gammopathy: Secondary | ICD-10-CM | POA: Diagnosis not present

## 2023-07-06 DIAGNOSIS — I129 Hypertensive chronic kidney disease with stage 1 through stage 4 chronic kidney disease, or unspecified chronic kidney disease: Secondary | ICD-10-CM | POA: Diagnosis not present

## 2023-07-28 ENCOUNTER — Encounter: Payer: Self-pay | Admitting: Podiatry

## 2023-07-28 ENCOUNTER — Ambulatory Visit (INDEPENDENT_AMBULATORY_CARE_PROVIDER_SITE_OTHER): Payer: Medicare Other

## 2023-07-28 ENCOUNTER — Ambulatory Visit (INDEPENDENT_AMBULATORY_CARE_PROVIDER_SITE_OTHER): Payer: Medicare Other | Admitting: Podiatry

## 2023-07-28 DIAGNOSIS — Z7901 Long term (current) use of anticoagulants: Secondary | ICD-10-CM | POA: Diagnosis not present

## 2023-07-28 DIAGNOSIS — L84 Corns and callosities: Secondary | ICD-10-CM

## 2023-07-28 DIAGNOSIS — M778 Other enthesopathies, not elsewhere classified: Secondary | ICD-10-CM

## 2023-07-28 DIAGNOSIS — M2042 Other hammer toe(s) (acquired), left foot: Secondary | ICD-10-CM

## 2023-07-28 NOTE — Progress Notes (Signed)
Subjective:   Patient ID: Mitchell Birks Sr., male   DOB: 72 y.o.   MRN: [id number]   HPI Chief Complaint  Patient presents with   Hammer Toe    Left foot hammertoe painful   72 year old male presents today with his wife for the above concerns.  He has pain on the second digit hammertoe on the left foot but also a callus on the bottom of his right foot submetatarsal 1.  He denies any recent injuries in the past or any objects.  No open lesions.  No splinter redness or drainage.  No other concerns.  He is on Xarelto  Review of Systems  All other systems reviewed and are negative.   Past Medical History:  Diagnosis Date   Alcohol abuse    Anemia    Anoxic brain injury (HCC)    Chronic pulmonary edema    CVA (cerebral vascular accident) (HCC)    Dysphagia    Gout    Hemiplegia affecting left dominant side (HCC)    History of stroke    Hyperlipemia    Hypertension    Seizures (HCC)    Stroke (HCC)    Thrombocytopenia (HCC)     Past Surgical History:  Procedure Laterality Date   lymph nodes     biopsy - benign   RADIOLOGY WITH ANESTHESIA N/A 07/17/2015   Procedure: RADIOLOGY WITH ANESTHESIA;  Surgeon: Medication Radiologist, MD;  Location: [facility];  Service: Radiology;  Laterality: N/A;   TRANSURETHRAL RESECTION OF PROSTATE N/A 09/26/2021   Procedure: TRANSURETHRAL RESECTION AND UNROOFING OF PROSTATIC ABSCESS;  Surgeon: Marcine Matar, MD;  Location: [facility];  Service: Urology;  Laterality: N/A;     Current Outpatient Medications:    acetaminophen (TYLENOL) 325 MG tablet, Take 2 tablets (650 mg total) by mouth every 6 (six) hours as needed for mild pain, moderate pain or fever (or Fever >/= 101). (Patient taking differently: Take 325-650 mg by mouth every 6 (six) hours as needed for mild pain (pain score 1-3), moderate pain (pain score 4-6) or fever (or Fever >/= 101).), Disp: , Rfl:    allopurinol (ZYLOPRIM) 100 MG tablet, Take 100 mg by mouth daily., Disp: , Rfl:     amLODipine (NORVASC) 10 MG tablet, Take 1 tablet (10 mg total) by mouth daily., Disp: 30 tablet, Rfl: 0   calcium-vitamin D (OSCAL WITH D) 500-200 MG-UNIT tablet, Take 1 tablet by mouth 2 (two) times daily., Disp: 60 tablet, Rfl: 0   carvedilol (COREG) 25 MG tablet, Take 1 tablet (25 mg total) by mouth 2 (two) times daily with a meal., Disp: 60 tablet, Rfl: 0   cetirizine (ZYRTEC) 10 MG tablet, Take 10 mg by mouth 2 (two) times daily., Disp: , Rfl:    cholecalciferol (VITAMIN D3) 25 MCG (1000 UNIT) tablet, Take 1,000 Units by mouth daily., Disp: , Rfl:    ferrous sulfate 325 (65 FE) MG tablet, Take 1 tablet (325 mg total) by mouth daily with breakfast., Disp: 30 tablet, Rfl: 0   folic acid (FOLVITE) 1 MG tablet, Take 1 tablet (1 mg total) by mouth daily., Disp: 30 tablet, Rfl: 0   furosemide (LASIX) 20 MG tablet, Take 1 tablet (20 mg total) by mouth daily. (Patient taking differently: Take 20 mg by mouth every Monday, Wednesday, and Friday.), Disp: 30 tablet, Rfl: 0   levETIRAcetam (KEPPRA XR) 750 MG 24 hr tablet, Take 2 tablets (1,500 mg total) by mouth at bedtime., Disp: 180 tablet, Rfl: 4   losartan (  COZAAR) 25 MG tablet, Take 25 mg by mouth daily., Disp: , Rfl:    multivitamin (PROSIGHT) TABS tablet, Take 1 tablet by mouth daily., Disp: 30 each, Rfl: 0   pantoprazole (PROTONIX) 40 MG tablet, Take 1 tablet (40 mg total) by mouth daily., Disp: 30 tablet, Rfl: 0   rivaroxaban (XARELTO) 20 MG TABS tablet, Take 1 tablet (20 mg total) by mouth daily with supper., Disp: 30 tablet, Rfl: 0   sodium bicarbonate 650 MG tablet, Take 650 mg by mouth 2 (two) times daily., Disp: , Rfl:    thiamine 100 MG tablet, Take 1 tablet (100 mg total) by mouth daily., Disp: 30 tablet, Rfl: 0  Allergies  Allergen Reactions   Hydrochlorothiazide     precipitates gout   Pseudoephedrine Rash         Objective:  Physical Exam  General: AAO x3, NAD  Dermatological: Hyperkeratotic lesion right foot submetatarsal 1  without any underlying ulceration, drainage or any signs of infection.  No evidence of foreign body.  No open lesions bilaterally.  Vascular: Dorsalis Pedis artery and Posterior Tibial artery pedal pulses are palpable bilateral with immedate capillary fill time.  There is no pain with calf compression, swelling, warmth, erythema.   Neruologic: Grossly intact via light touch bilateral.   Musculoskeletal: Rigid hammertoe contractures are present bilaterally with the second digit and left foot no symptomatic.  Unable to appreciate any area pinpoint tenderness otherwise.  Assessment:   Hammertoe deformity, hyperkeratotic preulcerative callus     Plan:  -Treatment options discussed including all alternatives, risks, and complications -Etiology of symptoms were discussed -X-rays obtained reviewed.  3 views of the left foot were obtained.  Arthritis present of the midfoot.  Hammertoe deformities are present. -Regards to the hammertoes we discussed the conservative as well as surgical options.  Given his comorbidities we will recommend continued conservative treatment Isoffloading pads.  Discussed shoe modifications avoid any excess pressure including the extra depth or double depth shoe. Monitor for any skin breakdown.  -Separate debrided hyperkeratotic lesion x 1 without any complications or bleeding. -Daily foot inspection  Return if symptoms worsen or fail to improve.  Vivi Barrack DPM

## 2023-07-28 NOTE — Patient Instructions (Addendum)
Choose a moisturizer from  the list below:  For normal skin: Moisturize feet once daily; do not apply between toes A.  CeraVe Daily Moisturizing Lotion B.  Lubriderm Advanced Therapy Lotion or Lubriderm Intense Skin Repair Lotion C.  Vaseline Intensive Care Lotion D.  Gold Bond Ultimate Diabetic Foot Lotion E.  Eucerin Intensive Repair Moisturizing Lotion  For extremely dry, cracked feet: moisturize feet once daily; do not apply between toes A. CeraVe Healing Ointment B. Eucerin Aquaphor Repairing Ointment (may be labeled Aquaphor Healing Ointment) C. Vaseline Petroleum Healing Jelly   If you have problems reaching your feet: apply to feet once daily; do not apply between toes A.  Eucerin Aquaphor Ointment Body Spray  B.  Vaseline Intensive Care Spray Moisturizer (Unscented,  Cocoa Radiant Spray or Aloe Smooth Spray)  --  Hammer Toe Hammer toe is a change in the shape, or a deformity, of the toe. The deformity causes the middle joint of the toe to stay bent. Hammer toe starts gradually. At first, the toe can be straightened. Then over time, the toe deformity becomes stiff, inflexible, and permanently bent. Hammer toe usually affects the second, third, or fourth toe. A hammer toe causes pain, especially when wearing shoes. Corns and calluses can result from the toe rubbing against the inside of the shoe. Early treatments to keep the toe straight may relieve pain. As the deformity of the toe becomes stiff and permanent, surgery may be needed to straighten the toe. What are the causes? This condition is caused by abnormal bending of the toe joint that is closest to your foot. Over time, the toe bending downward pulls on the muscles and connections (tendons) of the toe joint, making them weak and stiff. Wearing shoes that are too narrow in the toe box and do not allow toes to fully straighten can cause this condition. What increases the risk? You are more likely to develop this condition if  you: Are an older male. Wear shoes that are too small, or wear high-heeled shoes that pinch your toes. Have a second toe that is longer than your big toe (first toe). Injure your foot or toe. Have arthritis, or have a nerve or muscle disorder. Have diabetes or a condition known as Charcot joint, which may cause you to walk abnormally. Have a family history of hammer toe. Are a Advertising account planner. What are the signs or symptoms? Pain and deformity of the toe are the main symptoms of this condition. The pain is worse when wearing shoes, walking, or running. Other symptoms may include: A thickened patch of skin, called a corn or callus, that forms over the top of the bent part of the toe or between the toes. Redness and a burning feeling on the bent toe. An open sore that forms on the top of the bent toe. Not being able to straighten the affected toe. How is this diagnosed? This condition is diagnosed based on your symptoms and a physical exam. During the exam, your health care provider will try to straighten your toe to see how stiff the deformity is. You may also have tests, such as: A blood test to check for rheumatoid arthritis or diabetes. An X-ray to show how severe the toe deformity is. How is this treated? Treatment for this condition depends on whether the toe is flexible or deformed and no longer moveable. In less severe cases, a hammer toe can be straightened without surgery. These treatments include: Taping the toe into a straightened position.  Using pads and cushions to protect the bent toe. Wearing shoes that provide enough room for the toes. Doing toe-stretching exercises at home. Taking an NSAID, such as ibuprofen, to reduce pain and swelling. Using special orthotics or insoles for pain relief and to improve walking. If these treatments do not help or the toe has a severe deformity and cannot be straightened, surgery is the next option. The most common surgeries used to  straighten a hammer toe include: Arthroplasty or osteotomy. Part of the toe joint is reconstructed or removed, which allows the toe to straighten. Fusion. Cartilage between the two bones of the joint is taken out, and the bones are fused together into one longer bone. Implantation. Part of the bone is removed and replaced with an implant to allow the toe to move again. Flexor tendon transfer. The tendons that curl the toes down (flexor tendons) are repositioned. Follow these instructions at home: Take over-the-counter and prescription medicines only as told by your health care provider. Do toe-straightening and stretching exercises as told by your health care provider. Keep all follow-up visits. This is important. How is this prevented? Wear shoes that fit properly and give your toes enough room. Shoes should not cause pain. Buy shoes at the end of the day to make sure they fit well, since your foot may swell during the day. Make sure they are comfortable before you buy them. As you age, your shoe size might change, including the width. Measure both feet and buy shoes for the larger foot. A shoe repair store might be able to stretch shoes that feel tight in spots. Do not wear high-heeled shoes or shoes with pointed toes. Contact a health care provider if: Your pain gets worse. Your toe becomes red or swollen. You develop an open sore on your toe. Summary Hammer toe is a condition that gradually causes your toe to become bent and stiff. Hammer toe can be treated by taping the toe into a straightened position and doing toe-stretching exercises. If these treatments do not help, surgery may be needed. To prevent this condition, wear shoes that fit properly, give your toes enough room, and do not cause pain. This information is not intended to replace advice given to you by your health care provider. Make sure you discuss any questions you have with your health care provider. Document Revised:  12/20/2019 Document Reviewed: 12/20/2019 Elsevier Patient Education  2024 [employer].

## 2023-08-16 DIAGNOSIS — I10 Essential (primary) hypertension: Secondary | ICD-10-CM | POA: Diagnosis not present

## 2023-08-16 DIAGNOSIS — I5032 Chronic diastolic (congestive) heart failure: Secondary | ICD-10-CM | POA: Diagnosis not present

## 2023-08-16 DIAGNOSIS — R7303 Prediabetes: Secondary | ICD-10-CM | POA: Diagnosis not present

## 2023-08-16 DIAGNOSIS — D472 Monoclonal gammopathy: Secondary | ICD-10-CM | POA: Diagnosis not present

## 2023-08-16 DIAGNOSIS — R569 Unspecified convulsions: Secondary | ICD-10-CM | POA: Diagnosis not present

## 2023-08-16 DIAGNOSIS — M1A00X0 Idiopathic chronic gout, unspecified site, without tophus (tophi): Secondary | ICD-10-CM | POA: Diagnosis not present

## 2023-08-16 DIAGNOSIS — N184 Chronic kidney disease, stage 4 (severe): Secondary | ICD-10-CM | POA: Diagnosis not present

## 2023-08-16 DIAGNOSIS — D631 Anemia in chronic kidney disease: Secondary | ICD-10-CM | POA: Diagnosis not present

## 2023-08-16 DIAGNOSIS — I69354 Hemiplegia and hemiparesis following cerebral infarction affecting left non-dominant side: Secondary | ICD-10-CM | POA: Diagnosis not present

## 2023-08-23 ENCOUNTER — Ambulatory Visit: Payer: Medicare Other | Admitting: Neurology

## 2023-08-26 ENCOUNTER — Other Ambulatory Visit: Payer: Self-pay | Admitting: *Deleted

## 2023-08-26 DIAGNOSIS — D472 Monoclonal gammopathy: Secondary | ICD-10-CM

## 2023-08-29 ENCOUNTER — Inpatient Hospital Stay: Payer: Medicare Other | Attending: Hematology and Oncology

## 2023-08-29 ENCOUNTER — Inpatient Hospital Stay (HOSPITAL_BASED_OUTPATIENT_CLINIC_OR_DEPARTMENT_OTHER): Payer: Medicare Other | Admitting: Hematology and Oncology

## 2023-08-29 VITALS — BP 141/72 | HR 61 | Temp 97.7°F | Resp 16 | Wt 169.0 lb

## 2023-08-29 DIAGNOSIS — Z803 Family history of malignant neoplasm of breast: Secondary | ICD-10-CM | POA: Diagnosis not present

## 2023-08-29 DIAGNOSIS — D472 Monoclonal gammopathy: Secondary | ICD-10-CM | POA: Insufficient documentation

## 2023-08-29 DIAGNOSIS — D649 Anemia, unspecified: Secondary | ICD-10-CM | POA: Diagnosis not present

## 2023-08-29 DIAGNOSIS — N189 Chronic kidney disease, unspecified: Secondary | ICD-10-CM | POA: Insufficient documentation

## 2023-08-29 LAB — LACTATE DEHYDROGENASE: LDH: 191 U/L (ref 98–192)

## 2023-08-29 LAB — CBC WITH DIFFERENTIAL (CANCER CENTER ONLY)
Abs Immature Granulocytes: 0.01 10*3/uL (ref 0.00–0.07)
Basophils Absolute: 0 10*3/uL (ref 0.0–0.1)
Basophils Relative: 0 %
Eosinophils Absolute: 0.1 10*3/uL (ref 0.0–0.5)
Eosinophils Relative: 1 %
HCT: 29.6 % — ABNORMAL LOW (ref 39.0–52.0)
Hemoglobin: 9.9 g/dL — ABNORMAL LOW (ref 13.0–17.0)
Immature Granulocytes: 0 %
Lymphocytes Relative: 19 %
Lymphs Abs: 1 10*3/uL (ref 0.7–4.0)
MCH: 28.4 pg (ref 26.0–34.0)
MCHC: 33.4 g/dL (ref 30.0–36.0)
MCV: 85.1 fL (ref 80.0–100.0)
Monocytes Absolute: 0.3 10*3/uL (ref 0.1–1.0)
Monocytes Relative: 6 %
Neutro Abs: 4 10*3/uL (ref 1.7–7.7)
Neutrophils Relative %: 74 %
Platelet Count: 166 10*3/uL (ref 150–400)
RBC: 3.48 MIL/uL — ABNORMAL LOW (ref 4.22–5.81)
RDW: 15.8 % — ABNORMAL HIGH (ref 11.5–15.5)
WBC Count: 5.4 10*3/uL (ref 4.0–10.5)
nRBC: 0 % (ref 0.0–0.2)

## 2023-08-29 LAB — CMP (CANCER CENTER ONLY)
ALT: 9 U/L (ref 0–44)
AST: 12 U/L — ABNORMAL LOW (ref 15–41)
Albumin: 3.7 g/dL (ref 3.5–5.0)
Alkaline Phosphatase: 80 U/L (ref 38–126)
Anion gap: 9 (ref 5–15)
BUN: 70 mg/dL — ABNORMAL HIGH (ref 8–23)
CO2: 17 mmol/L — ABNORMAL LOW (ref 22–32)
Calcium: 9 mg/dL (ref 8.9–10.3)
Chloride: 110 mmol/L (ref 98–111)
Creatinine: 3.62 mg/dL — ABNORMAL HIGH (ref 0.61–1.24)
GFR, Estimated: 17 mL/min — ABNORMAL LOW (ref >60)
Glucose, Bld: 104 mg/dL — ABNORMAL HIGH (ref 70–99)
Potassium: 4.9 mmol/L (ref 3.5–5.1)
Sodium: 136 mmol/L (ref 135–145)
Total Bilirubin: 0.3 mg/dL (ref <1.2)
Total Protein: 7.5 g/dL (ref 6.5–8.1)

## 2023-08-29 NOTE — Progress Notes (Signed)
[facility] [phone]   [phone]  PROGRESS NOTE  Patient Care Team: Mirna Mires, MD as PCP - General (Family Medicine) Alwyn Ren Titus Dubin, MD as Consulting Physician (Internal Medicine) Mirna Mires, MD (Family Medicine) Clarene Duke Karma Lew, RN as [facility] Care Management  Hematological/Oncological History 06/29/2022: Labs from nephrologist, Dr. Vallery Sa: SPEP detected M spike measuring 0.3 g/dL. IFE showed IgG monoclonal protein with lambda light chain specificity. Kappa 66.1, Lambda 94.3, Ratio 0.70. Creatinine 2.49, Calcium 9.0, Hgb 11.4. 08/11/2022: Establish care with [facility] Hematology with Dr. Jeanie Sewer and Georga Kaufmann PA-C 09/29/2022: Kidney biopsy reveled severe chronic injury compatible with vascular etiology. No evidence of paraprotein-related renal disease.   CHIEF COMPLAINTS/PURPOSE OF CONSULTATION:  IgG Lambda MGUS   HISTORY OF PRESENTING ILLNESS:  Mitchell Birks Sr. 72 y.o. 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 He is accompanied by his wife for this visit.   On exam today, Mitchell [name] reports he has been well overall in the room since her last visit.  He is had no major changes in his health.  He reports he is receiving Botox therapy and will be going back to the doctor tomorrow.  He reports that is helping "a little bit".  He notes he is had no hospitalizations, ED visits, or new medications.  He reports that he was actually taken off a few medications including his potassium.  He is not currently having any bone pain or back pain.  He reports that he is not having any changes in his urine such as bubbling or foaming.  He reports he does have some occasional itching on his chest and sides, particular at night when he gets hot.  He reports that otherwise he has been at his baseline level of health and has no questions concerns or complaints today.  He denies fevers, chills, sweats, shortness of breath, chest pain or  cough. Rest of the 10 point ROS is below.   MEDICAL HISTORY:  Past Medical History:  Diagnosis Date   Alcohol abuse    Anemia    Anoxic brain injury (HCC)    Chronic pulmonary edema    CVA (cerebral vascular accident) (HCC)    Dysphagia    Gout    Hemiplegia affecting left dominant side (HCC)    History of stroke    Hyperlipemia    Hypertension    Seizures (HCC)    Stroke (HCC)    Thrombocytopenia (HCC)     SURGICAL HISTORY: Past Surgical History:  Procedure Laterality Date   lymph nodes     biopsy - benign   RADIOLOGY WITH ANESTHESIA N/A 07/17/2015   Procedure: RADIOLOGY WITH ANESTHESIA;  Surgeon: Medication Radiologist, MD;  Location: [facility] OR;  Service: Radiology;  Laterality: N/A;   TRANSURETHRAL RESECTION OF PROSTATE N/A 09/26/2021   Procedure: TRANSURETHRAL RESECTION AND UNROOFING OF PROSTATIC ABSCESS;  Surgeon: Marcine Matar, MD;  Location: [facility] ORS;  Service: Urology;  Laterality: N/A;    SOCIAL HISTORY: Social History   Socioeconomic History   Marital status: Married    Spouse name: Not on file   Number of children: 2   Years of education: 11th grade   Highest education level: Not on file  Occupational History   Occupation: Retired  Tobacco Use   Smoking status: Never   Smokeless tobacco: Never  Vaping Use   Vaping status: Never Used  Substance and Sexual Activity   Alcohol use: Never    Comment: History  of alcohol abuse   Drug use: Never    Types: Marijuana   Sexual activity: Never  Other Topics Concern   Not on file  Social History Narrative   ** Merged History Encounter **       He is currently in rehab following a fall. He is hoping to be discharged home soon.  Lives with his wife at home. Right-handed. Drinks decaf coffee   Social Determinants of Health   Financial Resource Strain: Not on file  Food Insecurity: No Food Insecurity (07/27/2022)   Hunger Vital Sign    Worried About Running Out of Food in the Last Year: Never true     Ran Out of Food in the Last Year: Never true  Transportation Needs: No Transportation Needs (07/27/2022)   PRAPARE - Administrator, Civil Service (Medical): No    Lack of Transportation (Non-Medical): No  Physical Activity: Not on file  Stress: Not on file  Social Connections: Not on file  Intimate Partner Violence: Not on file    FAMILY HISTORY: Family History  Problem Relation Age of Onset   Aneurysm Mother    Heart attack Father    Aneurysm Sister    Breast cancer Sister        diagnosed older than 55 years of age   Clotting disorder Brother     ALLERGIES:  is allergic to hydrochlorothiazide and pseudoephedrine.  MEDICATIONS:  Current Outpatient Medications  Medication Sig Dispense Refill   acetaminophen (TYLENOL) 325 MG tablet Take 2 tablets (650 mg total) by mouth every 6 (six) hours as needed for mild pain, moderate pain or fever (or Fever >/= 101). (Patient taking differently: Take 325-650 mg by mouth every 6 (six) hours as needed for mild pain (pain score 1-3), moderate pain (pain score 4-6) or fever (or Fever >/= 101).)     allopurinol (ZYLOPRIM) 100 MG tablet Take 100 mg by mouth daily.     amLODipine (NORVASC) 10 MG tablet Take 1 tablet (10 mg total) by mouth daily. 30 tablet 0   calcium-vitamin D (OSCAL WITH D) 500-200 MG-UNIT tablet Take 1 tablet by mouth 2 (two) times daily. 60 tablet 0   carvedilol (COREG) 25 MG tablet Take 1 tablet (25 mg total) by mouth 2 (two) times daily with a meal. 60 tablet 0   cetirizine (ZYRTEC) 10 MG tablet Take 10 mg by mouth 2 (two) times daily.     cholecalciferol (VITAMIN D3) 25 MCG (1000 UNIT) tablet Take 1,000 Units by mouth daily.     ferrous sulfate 325 (65 FE) MG tablet Take 1 tablet (325 mg total) by mouth daily with breakfast. 30 tablet 0   folic acid (FOLVITE) 1 MG tablet Take 1 tablet (1 mg total) by mouth daily. 30 tablet 0   furosemide (LASIX) 20 MG tablet Take 1 tablet (20 mg total) by mouth daily. (Patient  taking differently: Take 20 mg by mouth every Monday, Wednesday, and Friday.) 30 tablet 0   levETIRAcetam (KEPPRA XR) 750 MG 24 hr tablet Take 2 tablets (1,500 mg total) by mouth at bedtime. 180 tablet 4   losartan (COZAAR) 25 MG tablet Take 25 mg by mouth daily.     multivitamin (PROSIGHT) TABS tablet Take 1 tablet by mouth daily. 30 each 0   pantoprazole (PROTONIX) 40 MG tablet Take 1 tablet (40 mg total) by mouth daily. 30 tablet 0   rivaroxaban (XARELTO) 20 MG TABS tablet Take 1 tablet (20 mg total) by mouth  daily with supper. 30 tablet 0   sodium bicarbonate 650 MG tablet Take 650 mg by mouth 2 (two) times daily.     thiamine 100 MG tablet Take 1 tablet (100 mg total) by mouth daily. 30 tablet 0   No current facility-administered medications for this visit.    REVIEW OF SYSTEMS:   Constitutional: ( - ) fevers, ( - )  chills , ( - ) night sweats Eyes: ( - ) blurriness of vision, ( - ) double vision, ( - ) watery eyes Ears, nose, mouth, throat, and face: ( - ) mucositis, ( - ) sore throat Respiratory: ( - ) cough, ( - ) dyspnea, ( - ) wheezes Cardiovascular: ( - ) palpitation, ( - ) chest discomfort, ( - ) lower extremity swelling Gastrointestinal:  ( - ) nausea, ( - ) heartburn, ( - ) change in bowel habits Skin: ( - ) abnormal skin rashes Lymphatics: ( - ) new lymphadenopathy, ( - ) easy bruising Neurological: ( - ) numbness, ( - ) tingling, ( - ) new weaknesses Behavioral/Psych: ( - ) mood change, ( - ) new changes  All other systems were reviewed with the patient and are negative.  PHYSICAL EXAMINATION: ECOG PERFORMANCE STATUS: 1 - Symptomatic but completely ambulatory  Vitals:   08/29/23 1434  BP: (!) 141/72  Pulse: 61  Resp: 16  Temp: 97.7 F (36.5 C)  SpO2: 99%   Filed Weights   08/29/23 1434  Weight: 169 lb (76.7 kg)    GENERAL: well appearing male in NAD  SKIN: skin color, texture, turgor are normal, no rashes or significant lesions EYES: conjunctiva are pink  and non-injected, sclera clear LUNGS: clear to auscultation and percussion with normal breathing effort HEART: regular rate & rhythm and no murmurs and no lower extremity edema Musculoskeletal: no cyanosis of digits and no clubbing  PSYCH: alert & oriented x 3, fluent speech NEURO: no focal motor/sensory deficits  LABORATORY DATA:  I have reviewed the data as listed    Latest Ref Rng & Units 08/29/2023    2:11 PM 03/01/2023   12:44 PM 10/14/2022    6:30 PM  CBC  WBC 4.0 - 10.5 K/uL 5.4  4.3  6.4   Hemoglobin 13.0 - 17.0 g/dL 9.9  9.9  40.9   Hematocrit 39.0 - 52.0 % 29.6  29.2  32.9   Platelets 150 - 400 K/uL 166  206  179        Latest Ref Rng & Units 03/01/2023   12:44 PM 10/14/2022    6:30 PM 08/11/2022    3:34 PM  CMP  Glucose 70 - 99 mg/dL 811  97  97   BUN 8 - 23 mg/dL 65  60  48   Creatinine 0.61 - 1.24 mg/dL 9.14  7.82  9.56   Sodium 135 - 145 mmol/L 138  138  139   Potassium 3.5 - 5.1 mmol/L 4.7  3.9  4.6   Chloride 98 - 111 mmol/L 110  110  113   CO2 22 - 32 mmol/L 21  17  20    Calcium 8.9 - 10.3 mg/dL 8.6  8.7  8.8   Total Protein 6.5 - 8.1 g/dL 7.6  7.4  7.8   Total Bilirubin 0.3 - 1.2 mg/dL 0.3  0.3  0.3   Alkaline Phos 38 - 126 U/L 74  59  72   AST 15 - 41 U/L 12  14  15    ALT  0 - 44 U/L 6  10  12      ASSESSMENT & PLAN Mitchell Birks Sr. Is a 72 y.o. male who returns to the clinic for MGUS.    #IgG Lambda MGUS: --Underwent kidney biopsy on 09/29/2022. Path revealed severe chronic injury compatible with vascular etiology. No evidence of paraprotein-related renal disease.  --Bone met survey from 08/13/2022 was negative for focal lytic lesions. Repeat annual with next one due around November 2024.  --Labs today reviewed with patient and wife. WBC 5.4, hemoglobin 9.9, MCV 85.1, platelets 166. SPEP/IFE, sFLC and 24 hour UPEP pending --RTC in 6 months with labs unless further intervention is required.   #CKD: #Anemia: --Under the care of Dr. Vallery Sa. Creatinine is  higher than baseline, will request follow up with nephrology.  --Consider epo injections if Hgb is consistently < 10.   Orders Placed This Encounter  Procedures   DG Bone Survey Met    Standing Status:   Future    Standing Expiration Date:   08/28/2024    Order Specific Question:   Reason for Exam (SYMPTOM  OR DIAGNOSIS REQUIRED)    Answer:   MGUS, assess for lytic lesions    Order Specific Question:   Preferred imaging location?    Answer:   [facility]    All questions were answered. The patient knows to call the clinic with any problems, questions or concerns.  I have spent a total of 25 minutes minutes of face-to-face and non-face-to-face time, preparing to see the patient, performing a medically appropriate examination, counseling and educating the patient, ordering tests/procedures, documenting clinical information in the electronic health record, and care coordination.   Ulysees Barns, MD Department of Hematology/Oncology [facility] Phone: [phone] Pager: [phone] Email: [email or web address]

## 2023-08-30 ENCOUNTER — Ambulatory Visit (INDEPENDENT_AMBULATORY_CARE_PROVIDER_SITE_OTHER): Payer: Medicare Other | Admitting: Neurology

## 2023-08-30 VITALS — BP 142/77 | HR 72

## 2023-08-30 DIAGNOSIS — I69354 Hemiplegia and hemiparesis following cerebral infarction affecting left non-dominant side: Secondary | ICD-10-CM

## 2023-08-30 DIAGNOSIS — I6300 Cerebral infarction due to thrombosis of unspecified precerebral artery: Secondary | ICD-10-CM | POA: Diagnosis not present

## 2023-08-30 LAB — KAPPA/LAMBDA LIGHT CHAINS
Kappa free light chain: 98.4 mg/L — ABNORMAL HIGH (ref 3.3–19.4)
Kappa, lambda light chain ratio: 0.52 (ref 0.26–1.65)
Lambda free light chains: 190.5 mg/L — ABNORMAL HIGH (ref 5.7–26.3)

## 2023-08-30 MED ORDER — INCOBOTULINUMTOXINA 100 UNITS IM SOLR
300.0000 [IU] | INTRAMUSCULAR | Status: AC
  Administered 2023-08-30: 300 [IU] via INTRAMUSCULAR

## 2023-08-30 MED ORDER — LEVETIRACETAM ER 750 MG PO TB24: 1500.0000 mg | ORAL_TABLET | Freq: Every day | ORAL | 4 refills | Status: DC

## 2023-08-30 NOTE — Progress Notes (Signed)
xeomin 100units x 3 vial  [id number] [phone] Exp-2026/12 B/B  Bacteriostatic 0.9% Sodium Chloride- 6mL  FAO:[id number] Expiration: 12/27/2023 NDC: [id number] Dx: Cerebrovascular accident (CVA) due to thrombosis of precerebral artery (HCC)  - 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)I69.354  WITNESSED UU:VOZDG COOK

## 2023-08-30 NOTE — Progress Notes (Signed)
Chief Complaint  Patient presents with   Procedure    Rm 14 with spouse Mitchell [name]  Pt is well      ASSESSMENT AND PLAN  Mitchell Smelley Sr. is a 72 y.o. 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, Electrical stimulation guided botulism toxin injection, used Botox a 300 units (100 units/dissolving to 2 cc of normal saline), 1st injection was on May 18 2023, was not sure about the benefit, will try again, if no improvement, will not continue  Left brachialis 50 units Left pectoralis major 50 units Left latissimus dorsi 50 units Left pronator teres 50 units Left palmaris longus 25 units Left brachioradialis 25 units Left flexor digitorum profundus 25 units Left flexor digitorum superficialis 25 units  Return to clinic with nurse practitioner in 12 months, if he continue to do well with current dose of Keppra and may try lower dose such as Keppra xr 500 mg 2 tablets at bedtime   DIAGNOSTIC DATA (LABS, IMAGING, TESTING) - I reviewed patient records, labs, notes, testing and imaging myself where available.   MEDICAL HISTORY:  Mitchell [name]. is a 72 year old male, seen in request by Mitchell [name] for evaluation of botulism toxin injection for spastic left upper extremity, his primary care physician is Mitchell [name], Mitchell [name],  I reviewed and summarized the referring note. Stroke Seizure Hypertension Hyperlipidemia Alcohol use Gout Severe osteoarthritis Obstructive sleep apnea History of anoxic brain injury  He had a history of stroke in the past, which caused spastic left hemiparesis, along with his severe arthritis, deformity of joints, this has pose significant difficulty in his daily function, gait abnormality, hide left shoulders, deformed bilateral hands joints, limited elbow range of motion, painful on passive stretch, hope to receive botulism injection to  alleviate the pain  At home, he can walk short distance without assistant  Personally reviewed CT head without contrast, in October 2022 significant atrophy, small vessel disease, CT cervical spine, acute fracture of the anterior inferior corner of C5 vertebral body, mild prevertebral edema, chronic fusion at C2-3, C4-5  MRI of the brain in September 2019, chronic infarction involving right parietal lobe, mild small vessel disease, negative MRA of the brain  UPDATE Aug 30 2023: He received his first Botox a injection in August 2024 to spastic left upper extremity, was not sure about the benefit, the evaluation was hindered by his severe joint deformity by osteo arthritis and gout, he will try again this time will only return for repeat injection if he noticed any benefit,  Tolerating Keppra XR 750 mg 2 tablets every night, no recurrent seizure,  PHYSICAL EXAM:   Vitals:   08/30/23 1131  BP: (!) 142/77  Pulse: 72   There is no height or weight on file to calculate BMI.  PHYSICAL EXAMNIATION:  Gen: NAD, conversant, well nourised, well groomed                     Cardiovascular: Regular rate rhythm, no peripheral edema, warm, nontender. Eyes: Conjunctivae clear without exudates or hemorrhage Neck: Supple, no carotid bruits. Pulmonary: Clear to auscultation bilaterally   NEUROLOGICAL EXAM:  MENTAL STATUS: Speech/cognition: Awake, alert, oriented to history taking and casual conversation CRANIAL NERVES: CN II: Visual fields are full to confrontation. Pupils are round equal and briskly reactive to light. CN III, IV, VI: extraocular movement are normal. No ptosis. CN V:  Facial sensation is intact to light touch CN VII: Left lower face weakness CN VIII: Hearing is normal to causal conversation. CN IX, X: Phonation is normal. CN XI: Head turning and shoulder shrug are intact  MOTOR: Deformity of bilateral hands joints, contraction of left elbow, maximum left elbow extension 170  degree, limited range of motion of left shoulder, tendency for left wrist flexion, finger flexion, even with passive stretch, he can no longer reach full range of motion on the deformed fingers,  REFLEXES: Hyperreflexia of left upper and lower extremity  SENSORY: Intact to light touch,    COORDINATION: There is no trunk or limb dysmetria noted.  GAIT/STANCE: Need push-up to get up from seated position, cautious wide-based, holding left arm in elbow flexion, wrist flexion,  REVIEW OF SYSTEMS:  Full 14 system review of systems performed and notable only for as above All other review of systems were negative.   ALLERGIES: Allergies  Allergen Reactions   Hydrochlorothiazide     precipitates gout   Pseudoephedrine Rash    HOME MEDICATIONS: Current Outpatient Medications  Medication Sig Dispense Refill   acetaminophen (TYLENOL) 325 MG tablet Take 2 tablets (650 mg total) by mouth every 6 (six) hours as needed for mild pain, moderate pain or fever (or Fever >/= 101). (Patient taking differently: Take 325-650 mg by mouth every 6 (six) hours as needed for mild pain (pain score 1-3), moderate pain (pain score 4-6) or fever (or Fever >/= 101).)     allopurinol (ZYLOPRIM) 100 MG tablet Take 100 mg by mouth daily.     amLODipine (NORVASC) 10 MG tablet Take 1 tablet (10 mg total) by mouth daily. 30 tablet 0   calcium-vitamin D (OSCAL WITH D) 500-200 MG-UNIT tablet Take 1 tablet by mouth 2 (two) times daily. 60 tablet 0   carvedilol (COREG) 25 MG tablet Take 1 tablet (25 mg total) by mouth 2 (two) times daily with a meal. 60 tablet 0   cetirizine (ZYRTEC) 10 MG tablet Take 10 mg by mouth 2 (two) times daily.     cholecalciferol (VITAMIN D3) 25 MCG (1000 UNIT) tablet Take 1,000 Units by mouth daily.     ferrous sulfate 325 (65 FE) MG tablet Take 1 tablet (325 mg total) by mouth daily with breakfast. 30 tablet 0   folic acid (FOLVITE) 1 MG tablet Take 1 tablet (1 mg total) by mouth daily. 30  tablet 0   furosemide (LASIX) 20 MG tablet Take 1 tablet (20 mg total) by mouth daily. (Patient taking differently: Take 20 mg by mouth every Monday, Wednesday, and Friday.) 30 tablet 0   levETIRAcetam (KEPPRA XR) 750 MG 24 hr tablet Take 2 tablets (1,500 mg total) by mouth at bedtime. 180 tablet 4   losartan (COZAAR) 25 MG tablet Take 25 mg by mouth daily.     multivitamin (PROSIGHT) TABS tablet Take 1 tablet by mouth daily. 30 each 0   pantoprazole (PROTONIX) 40 MG tablet Take 1 tablet (40 mg total) by mouth daily. 30 tablet 0   rivaroxaban (XARELTO) 20 MG TABS tablet Take 1 tablet (20 mg total) by mouth daily with supper. 30 tablet 0   sodium bicarbonate 650 MG tablet Take 650 mg by mouth 2 (two) times daily.     thiamine 100 MG tablet Take 1 tablet (100 mg total) by mouth daily. 30 tablet 0   Current Facility-Administered Medications  Medication Dose Route Frequency Provider Last Rate Last Admin   incobotulinumtoxinA (XEOMIN) 100 units injection  300 Units  300 Units Intramuscular Q90 days Levert Feinstein, MD        PAST MEDICAL HISTORY: Past Medical History:  Diagnosis Date   Alcohol abuse    Anemia    Anoxic brain injury (HCC)    Chronic pulmonary edema    CVA (cerebral vascular accident) (HCC)    Dysphagia    Gout    Hemiplegia affecting left dominant side (HCC)    History of stroke    Hyperlipemia    Hypertension    Seizures (HCC)    Stroke (HCC)    Thrombocytopenia (HCC)     PAST SURGICAL HISTORY: Past Surgical History:  Procedure Laterality Date   lymph nodes     biopsy - benign   RADIOLOGY WITH ANESTHESIA N/A 07/17/2015   Procedure: RADIOLOGY WITH ANESTHESIA;  Surgeon: Medication Radiologist, MD;  Location: [facility] OR;  Service: Radiology;  Laterality: N/A;   TRANSURETHRAL RESECTION OF PROSTATE N/A 09/26/2021   Procedure: TRANSURETHRAL RESECTION AND UNROOFING OF PROSTATIC ABSCESS;  Surgeon: Marcine Matar, MD;  Location: [facility] ORS;  Service: Urology;  Laterality: N/A;     FAMILY HISTORY: Family History  Problem Relation Age of Onset   Aneurysm Mother    Heart attack Father    Aneurysm Sister    Breast cancer Sister        diagnosed older than 43 years of age   Clotting disorder Brother     SOCIAL HISTORY: Social History   Socioeconomic History   Marital status: Married    Spouse name: Not on file   Number of children: 2   Years of education: 11th grade   Highest education level: Not on file  Occupational History   Occupation: Retired  Tobacco Use   Smoking status: Never   Smokeless tobacco: Never  Vaping Use   Vaping status: Never Used  Substance and Sexual Activity   Alcohol use: Never    Comment: History of alcohol abuse   Drug use: Never    Types: Marijuana   Sexual activity: Never  Other Topics Concern   Not on file  Social History Narrative   ** Merged History Encounter **       He is currently in rehab following a fall. He is hoping to be discharged home soon.  Lives with his wife at home. Right-handed. Drinks decaf coffee   Social Determinants of Health   Financial Resource Strain: Not on file  Food Insecurity: No Food Insecurity (07/27/2022)   Hunger Vital Sign    Worried About Running Out of Food in the Last Year: Never true    Ran Out of Food in the Last Year: Never true  Transportation Needs: No Transportation Needs (07/27/2022)   PRAPARE - Administrator, Civil Service (Medical): No    Lack of Transportation (Non-Medical): No  Physical Activity: Not on file  Stress: Not on file  Social Connections: Not on file  Intimate Partner Violence: Not on file      Levert Feinstein, M.D. Ph.D.  [facility] [address] [place], [place] [address] Ph: [phone] Fax: [phone]  CC:  Mirna Mires, MD [address] [place],  [place] [address]  Mirna Mires, MD

## 2023-09-01 LAB — MULTIPLE MYELOMA PANEL, SERUM
Albumin SerPl Elph-Mcnc: 3.3 g/dL (ref 2.9–4.4)
Albumin/Glob SerPl: 0.9 (ref 0.7–1.7)
Alpha 1: 0.3 g/dL (ref 0.0–0.4)
Alpha2 Glob SerPl Elph-Mcnc: 0.9 g/dL (ref 0.4–1.0)
B-Globulin SerPl Elph-Mcnc: 1.6 g/dL — ABNORMAL HIGH (ref 0.7–1.3)
Gamma Glob SerPl Elph-Mcnc: 1 g/dL (ref 0.4–1.8)
Globulin, Total: 3.8 g/dL (ref 2.2–3.9)
IgA: 485 mg/dL — ABNORMAL HIGH (ref 61–437)
IgG (Immunoglobin G), Serum: 1533 mg/dL (ref 603–1613)
IgM (Immunoglobulin M), Srm: 85 mg/dL (ref 15–143)
M Protein SerPl Elph-Mcnc: 0.4 g/dL — ABNORMAL HIGH
Total Protein ELP: 7.1 g/dL (ref 6.0–8.5)

## 2023-09-06 DIAGNOSIS — M1180 Other specified crystal arthropathies, unspecified site: Secondary | ICD-10-CM | POA: Diagnosis not present

## 2023-09-06 DIAGNOSIS — N184 Chronic kidney disease, stage 4 (severe): Secondary | ICD-10-CM | POA: Diagnosis not present

## 2023-09-06 DIAGNOSIS — M25569 Pain in unspecified knee: Secondary | ICD-10-CM | POA: Diagnosis not present

## 2023-09-06 DIAGNOSIS — M150 Primary generalized (osteo)arthritis: Secondary | ICD-10-CM | POA: Diagnosis not present

## 2023-09-06 DIAGNOSIS — Z8673 Personal history of transient ischemic attack (TIA), and cerebral infarction without residual deficits: Secondary | ICD-10-CM | POA: Diagnosis not present

## 2023-09-06 DIAGNOSIS — Z79899 Other long term (current) drug therapy: Secondary | ICD-10-CM | POA: Diagnosis not present

## 2023-09-06 DIAGNOSIS — Z23 Encounter for immunization: Secondary | ICD-10-CM | POA: Diagnosis not present

## 2023-09-06 DIAGNOSIS — M1A09X1 Idiopathic chronic gout, multiple sites, with tophus (tophi): Secondary | ICD-10-CM | POA: Diagnosis not present

## 2023-09-16 ENCOUNTER — Ambulatory Visit (HOSPITAL_COMMUNITY)
Admission: RE | Admit: 2023-09-16 | Discharge: 2023-09-16 | Disposition: A | Discharge status: Home / Self Care | Payer: Medicare Other | Source: Ambulatory Visit | Attending: Hematology and Oncology | Admitting: Hematology and Oncology

## 2023-09-16 DIAGNOSIS — D472 Monoclonal gammopathy: Secondary | ICD-10-CM | POA: Insufficient documentation

## 2023-10-20 DIAGNOSIS — D472 Monoclonal gammopathy: Secondary | ICD-10-CM | POA: Diagnosis not present

## 2023-10-20 DIAGNOSIS — I5042 Chronic combined systolic (congestive) and diastolic (congestive) heart failure: Secondary | ICD-10-CM | POA: Diagnosis not present

## 2023-10-20 DIAGNOSIS — N189 Chronic kidney disease, unspecified: Secondary | ICD-10-CM | POA: Diagnosis not present

## 2023-10-20 DIAGNOSIS — N184 Chronic kidney disease, stage 4 (severe): Secondary | ICD-10-CM | POA: Diagnosis not present

## 2023-10-20 DIAGNOSIS — D631 Anemia in chronic kidney disease: Secondary | ICD-10-CM | POA: Diagnosis not present

## 2023-10-20 DIAGNOSIS — N2581 Secondary hyperparathyroidism of renal origin: Secondary | ICD-10-CM | POA: Diagnosis not present

## 2023-10-20 DIAGNOSIS — R809 Proteinuria, unspecified: Secondary | ICD-10-CM | POA: Diagnosis not present

## 2023-10-20 DIAGNOSIS — E872 Acidosis, unspecified: Secondary | ICD-10-CM | POA: Diagnosis not present

## 2023-10-20 DIAGNOSIS — I129 Hypertensive chronic kidney disease with stage 1 through stage 4 chronic kidney disease, or unspecified chronic kidney disease: Secondary | ICD-10-CM | POA: Diagnosis not present

## 2023-10-20 DIAGNOSIS — M1A00X0 Idiopathic chronic gout, unspecified site, without tophus (tophi): Secondary | ICD-10-CM | POA: Diagnosis not present

## 2023-11-07 ENCOUNTER — Other Ambulatory Visit: Payer: Self-pay

## 2023-11-07 DIAGNOSIS — N186 End stage renal disease: Secondary | ICD-10-CM

## 2023-11-15 DIAGNOSIS — I10 Essential (primary) hypertension: Secondary | ICD-10-CM | POA: Diagnosis not present

## 2023-11-15 DIAGNOSIS — M1A00X0 Idiopathic chronic gout, unspecified site, without tophus (tophi): Secondary | ICD-10-CM | POA: Diagnosis not present

## 2023-11-15 DIAGNOSIS — I69354 Hemiplegia and hemiparesis following cerebral infarction affecting left non-dominant side: Secondary | ICD-10-CM | POA: Diagnosis not present

## 2023-11-15 DIAGNOSIS — D472 Monoclonal gammopathy: Secondary | ICD-10-CM | POA: Diagnosis not present

## 2023-11-15 DIAGNOSIS — I5032 Chronic diastolic (congestive) heart failure: Secondary | ICD-10-CM | POA: Diagnosis not present

## 2023-11-15 DIAGNOSIS — R569 Unspecified convulsions: Secondary | ICD-10-CM | POA: Diagnosis not present

## 2023-11-15 DIAGNOSIS — N184 Chronic kidney disease, stage 4 (severe): Secondary | ICD-10-CM | POA: Diagnosis not present

## 2023-11-15 DIAGNOSIS — D631 Anemia in chronic kidney disease: Secondary | ICD-10-CM | POA: Diagnosis not present

## 2023-11-23 ENCOUNTER — Ambulatory Visit (INDEPENDENT_AMBULATORY_CARE_PROVIDER_SITE_OTHER): Payer: Medicare Other | Admitting: Vascular Surgery

## 2023-11-23 ENCOUNTER — Ambulatory Visit (INDEPENDENT_AMBULATORY_CARE_PROVIDER_SITE_OTHER)
Admission: RE | Admit: 2023-11-23 | Discharge: 2023-11-23 | Disposition: A | Discharge status: Home / Self Care | Payer: Medicare Other | Source: Ambulatory Visit | Attending: Vascular Surgery | Admitting: Vascular Surgery

## 2023-11-23 ENCOUNTER — Ambulatory Visit (HOSPITAL_COMMUNITY)
Admission: RE | Admit: 2023-11-23 | Discharge: 2023-11-23 | Disposition: A | Discharge status: Home / Self Care | Payer: Medicare Other | Source: Ambulatory Visit | Attending: Vascular Surgery | Admitting: Vascular Surgery

## 2023-11-23 ENCOUNTER — Encounter: Payer: Self-pay | Admitting: Vascular Surgery

## 2023-11-23 VITALS — BP 170/75 | HR 69 | Temp 98.3°F | Ht 68.0 in

## 2023-11-23 DIAGNOSIS — N186 End stage renal disease: Secondary | ICD-10-CM

## 2023-11-23 DIAGNOSIS — N184 Chronic kidney disease, stage 4 (severe): Secondary | ICD-10-CM

## 2023-11-23 DIAGNOSIS — Z992 Dependence on renal dialysis: Secondary | ICD-10-CM | POA: Insufficient documentation

## 2023-11-23 NOTE — Progress Notes (Signed)
 Patient ID: Mitchell Birks Sr., male   DOB: 05-May-1951, 73 y.o.   MRN: [id number]  Reason for Consult: New Patient (Initial Visit)   Referred by Mirna Mires, MD  Subjective:     HPI:  Mitchell Fidalgo Sr. is a 73 y.o. male history of chronic kidney disease with a history of stroke.  He is right-hand dominant and is mostly debilitated in the left upper extremity.  He does have a history of lymph node surgery in the left axilla and no other left upper extremity surgeries.  He is on Xarelto.  He has never been on dialysis.  Past Medical History:  Diagnosis Date   Alcohol abuse    Anemia    Anoxic brain injury (HCC)    Chronic kidney disease    Chronic pulmonary edema    CVA (cerebral vascular accident) (HCC)    Dysphagia    Gout    Hemiplegia affecting left dominant side (HCC)    History of stroke    Hyperlipemia    Hypertension    Seizures (HCC)    Stroke (HCC)    Thrombocytopenia (HCC)    Family History  Problem Relation Age of Onset   Aneurysm Mother    Heart attack Father    Aneurysm Sister    Breast cancer Sister        diagnosed older than 75 years of age   Clotting disorder Brother    Past Surgical History:  Procedure Laterality Date   lymph nodes     biopsy - benign   RADIOLOGY WITH ANESTHESIA N/A 07/17/2015   Procedure: RADIOLOGY WITH ANESTHESIA;  Surgeon: Medication Radiologist, MD;  Location: [facility] OR;  Service: Radiology;  Laterality: N/A;   TRANSURETHRAL RESECTION OF PROSTATE N/A 09/26/2021   Procedure: TRANSURETHRAL RESECTION AND UNROOFING OF PROSTATIC ABSCESS;  Surgeon: Marcine Matar, MD;  Location: [facility];  Service: Urology;  Laterality: N/A;    Short Social History:  Social History   Tobacco Use   Smoking status: Never   Smokeless tobacco: Never  Substance Use Topics   Alcohol use: Never    Comment: History of alcohol abuse    Allergies  Allergen Reactions   Hydrochlorothiazide     precipitates gout   Pseudoephedrine Rash    Current Outpatient  Medications  Medication Sig Dispense Refill   acetaminophen (TYLENOL) 325 MG tablet Take 2 tablets (650 mg total) by mouth every 6 (six) hours as needed for mild pain, moderate pain or fever (or Fever >/= 101). (Patient taking differently: Take 325-650 mg by mouth every 6 (six) hours as needed for mild pain (pain score 1-3), moderate pain (pain score 4-6) or fever (or Fever >/= 101).)     allopurinol (ZYLOPRIM) 100 MG tablet Take 100 mg by mouth daily.     amLODipine (NORVASC) 10 MG tablet Take 1 tablet (10 mg total) by mouth daily. 30 tablet 0   Calcium Carb-Cholecalciferol (OYSTER SHELL CALCIUM W/D) 500-5 MG-MCG TABS Take 1 tablet by mouth 2 (two) times daily.     calcium-vitamin D (OSCAL WITH D) 500-200 MG-UNIT tablet Take 1 tablet by mouth 2 (two) times daily. 60 tablet 0   carvedilol (COREG) 25 MG tablet Take 1 tablet (25 mg total) by mouth 2 (two) times daily with a meal. 60 tablet 0   cetirizine (ZYRTEC) 10 MG tablet Take 10 mg by mouth 2 (two) times daily.     cholecalciferol (VITAMIN D3) 25 MCG (1000 UNIT) tablet Take 1,000 Units by mouth daily.  ferrous sulfate 325 (65 FE) MG tablet Take 1 tablet (325 mg total) by mouth daily with breakfast. 30 tablet 0   folic acid (FOLVITE) 1 MG tablet Take 1 tablet (1 mg total) by mouth daily. 30 tablet 0   furosemide (LASIX) 20 MG tablet Take 1 tablet (20 mg total) by mouth daily. (Patient taking differently: Take 20 mg by mouth every Monday, Wednesday, and Friday.) 30 tablet 0   levETIRAcetam (KEPPRA XR) 750 MG 24 hr tablet Take 2 tablets (1,500 mg total) by mouth at bedtime. 180 tablet 4   losartan (COZAAR) 25 MG tablet Take 25 mg by mouth daily.     multivitamin (PROSIGHT) TABS tablet Take 1 tablet by mouth daily. 30 each 0   pantoprazole (PROTONIX) 40 MG tablet Take 1 tablet (40 mg total) by mouth daily. 30 tablet 0   rivaroxaban (XARELTO) 20 MG TABS tablet Take 1 tablet (20 mg total) by mouth daily with supper. 30 tablet 0   sodium bicarbonate  650 MG tablet Take 650 mg by mouth 2 (two) times daily.     thiamine 100 MG tablet Take 1 tablet (100 mg total) by mouth daily. 30 tablet 0   Current Facility-Administered Medications  Medication Dose Route Frequency Provider Last Rate Last Admin   incobotulinumtoxinA (XEOMIN) 100 units injection 300 Units  300 Units Intramuscular Q90 days Levert Feinstein, MD   300 Units at 08/30/23 1412    Review of Systems  Constitutional:  Constitutional negative. HENT: HENT negative.  Eyes: Eyes negative.  Respiratory: Respiratory negative.  Cardiovascular: Cardiovascular negative.  GI: Gastrointestinal negative.  Musculoskeletal: Musculoskeletal negative.  Skin: Skin negative.  Neurological: Positive for focal weakness.  Hematologic: Hematologic/lymphatic negative.  Psychiatric: Psychiatric negative.        Objective:  Objective   Vitals:   11/23/23 0828  BP: (!) 170/75  Pulse: 69  Temp: 98.3 F (36.8 C)  SpO2: 97%  Height: 5\' 8"  (1.727 m)   Body mass index is 25.7 kg/m.  Physical Exam HENT:     Head: Normocephalic.     Nose: Nose normal.  Eyes:     Pupils: Pupils are equal, round, and reactive to light.  Cardiovascular:     Pulses:          Radial pulses are 2+ on the right side and 2+ on the left side.  Pulmonary:     Effort: Pulmonary effort is normal.  Abdominal:     General: Abdomen is flat.  Musculoskeletal:        General: Normal range of motion.     Right lower leg: No edema.     Left lower leg: No edema.  Neurological:     Mental Status: He is alert.     Data: Right Pre-Dialysis Findings:  +-----------------------+----------+--------------------+---------+--------  +  Location              PSV (cm/s)Intralum. Diam. (cm)Waveform  Comments  +-----------------------+----------+--------------------+---------+--------  +  Brachial Antecub. fossa101       0.61                triphasic            +-----------------------+----------+--------------------+---------+--------  +  Radial Art at Wrist    81        0.34                triphasic           +-----------------------+----------+--------------------+---------+--------  +  Ulnar Art at Wrist  78        0.20                triphasic             Left Pre-Dialysis Findings:  +-----------------------+----------+--------------------+---------+--------  +  Location              PSV (cm/s)Intralum. Diam. (cm)Waveform  Comments  +-----------------------+----------+--------------------+---------+--------  +  Brachial Antecub. fossa65        0.56                triphasic           +-----------------------+----------+--------------------+---------+--------  +  Radial Art at Wrist    76        0.31                triphasic           +-----------------------+----------+--------------------+---------+--------  +  Ulnar Art at Wrist     62        0.16                triphasic            Right Cephalic   Diameter (cm)Depth (cm)Findings  +-----------------+-------------+----------+--------+  Shoulder            0.15                         +-----------------+-------------+----------+--------+  Prox upper arm       0.18                         +-----------------+-------------+----------+--------+  Mid upper arm        0.17                         +-----------------+-------------+----------+--------+  Dist upper arm    0.12 / 0.15                     +-----------------+-------------+----------+--------+  Antecubital fossa    0.29                         +-----------------+-------------+----------+--------+  Prox forearm      0.19 / 0.31                     +-----------------+-------------+----------+--------+  Mid forearm          0.21                         +-----------------+-------------+----------+--------+  Dist forearm         0.15                          +-----------------+-------------+----------+--------+   +-----------------+-------------+----------+-------------------------------  ---+  Right Basilic    Diameter (cm)Depth (cm)             Findings                +-----------------+-------------+----------+-------------------------------  ---+  Shoulder            0.17                                                    +-----------------+-------------+----------+-------------------------------  ---+  Mid upper arm     0.50 / 0.19           appears to go posterior to  artery.  +-----------------+-------------+----------+-------------------------------  ---+  Dist upper arm       0.16                                                    +-----------------+-------------+----------+-------------------------------  ---+  Antecubital fossa    0.18                                                    +-----------------+-------------+----------+-------------------------------  ---+  Prox forearm         0.17                                                    +-----------------+-------------+----------+-------------------------------  ---+   +-----------------+-------------+----------+-------------+  Left Cephalic    Diameter (cm)Depth (cm)  Findings     +-----------------+-------------+----------+-------------+  Prox upper arm       0.20                              +-----------------+-------------+----------+-------------+  Mid upper arm        0.14                              +-----------------+-------------+----------+-------------+  Dist upper arm       0.00       15.00        NWV       +-----------------+-------------+----------+-------------+  Antecubital fossa 0.34 / 0.29                          +-----------------+-------------+----------+-------------+  Prox forearm         0.20                               +-----------------+-------------+----------+-------------+  Mid forearm          0.17               mid to dst fa  +-----------------+-------------+----------+-------------+  Dist forearm         0.16                              +-----------------+-------------+----------+-------------+   +-----------------+-------------+----------+--------+  Left Basilic     Diameter (cm)Depth (cm)Findings  +-----------------+-------------+----------+--------+  Mid upper arm        0.22                         +-----------------+-------------+----------+--------+  Dist upper arm       0.27                         +-----------------+-------------+----------+--------+  Antecubital fossa  0.22                         +-----------------+-------------+----------+--------+  Prox forearm         0.28                         +-----------------+-------------+----------+--------+         Assessment/Plan:    73 year old male with history of stroke with left hemiplegia was previously right-hand dominant does not appear to have suitable vein for access creation.  I discussed his options being TDC versus graft with or without AC.  If he is closer to dialysis we will plan to place the graft 2 to 4 weeks prior to dialysis need to avoid TDC.  This was discussed with the family and they demonstrate good understanding.     Maeola Harman MD [facility]

## 2023-11-26 ENCOUNTER — Emergency Department (HOSPITAL_COMMUNITY)
Admission: EM | Admit: 2023-11-26 | Discharge: 2023-11-26 | Disposition: A | Discharge status: Home / Self Care | Attending: Emergency Medicine | Admitting: Emergency Medicine

## 2023-11-26 ENCOUNTER — Encounter (HOSPITAL_COMMUNITY): Payer: Self-pay | Admitting: Emergency Medicine

## 2023-11-26 DIAGNOSIS — R040 Epistaxis: Secondary | ICD-10-CM | POA: Insufficient documentation

## 2023-11-26 DIAGNOSIS — Z8673 Personal history of transient ischemic attack (TIA), and cerebral infarction without residual deficits: Secondary | ICD-10-CM | POA: Diagnosis not present

## 2023-11-26 DIAGNOSIS — I129 Hypertensive chronic kidney disease with stage 1 through stage 4 chronic kidney disease, or unspecified chronic kidney disease: Secondary | ICD-10-CM | POA: Diagnosis not present

## 2023-11-26 DIAGNOSIS — Z7901 Long term (current) use of anticoagulants: Secondary | ICD-10-CM | POA: Diagnosis not present

## 2023-11-26 DIAGNOSIS — N189 Chronic kidney disease, unspecified: Secondary | ICD-10-CM | POA: Diagnosis not present

## 2023-11-26 DIAGNOSIS — Z79899 Other long term (current) drug therapy: Secondary | ICD-10-CM | POA: Insufficient documentation

## 2023-11-26 NOTE — ED Triage Notes (Signed)
 Pt here from home with c/o nose bleed that just started a few hours before arrival , pt is on blood thinners

## 2023-11-26 NOTE — Discharge Instructions (Signed)
 Continue your daily prescribed medications. If bleeding recurs, apply CONSTANT pressure for at least 5 minutes with your head tilted forward to prevent you from swallowing any excessive amounts of blood. You can try applying ice to the back of the neck while applying pressure. Return to the ED if bleeding remains uncontrolled despite supportive efforts.

## 2023-11-26 NOTE — ED Provider Notes (Signed)
 [place] EMERGENCY DEPARTMENT AT [facility] Provider Note   CSN: [id number] Arrival date & time: 11/26/23  2038     History  Chief Complaint  Patient presents with   Epistaxis    Mitchell Torry Sr. is a 73 y.o. male.  73 year old male with a history of alcohol abuse, hyperlipidemia, CVA, hypertension, CKD, chronically anticoagulated on Xarelto presents to the emergency department for evaluation of a nosebleed.  Symptoms began shortly after eating dinner around 1800.  He tried applying pressure at home without relief.  Received a nose clamp in triage and states that his nosebleed has now spontaneously resolved for the past hour. Denies facial trauma inciting symptoms, facial pain, SOB, lightheadedness, fever.  The history is provided by the patient. No language interpreter was used.  Epistaxis      Home Medications Prior to Admission medications   Medication Sig Start Date End Date Taking? Authorizing Provider  acetaminophen (TYLENOL) 325 MG tablet Take 2 tablets (650 mg total) by mouth every 6 (six) hours as needed for mild pain, moderate pain or fever (or Fever >/= 101). Patient taking differently: Take 325-650 mg by mouth every 6 (six) hours as needed for mild pain (pain score 1-3), moderate pain (pain score 4-6) or fever (or Fever >/= 101). 03/09/17   Hongalgi, Maximino Greenland, MD  allopurinol (ZYLOPRIM) 100 MG tablet Take 100 mg by mouth daily.    [provider]  amLODipine (NORVASC) 10 MG tablet Take 1 tablet (10 mg total) by mouth daily. 08/01/18   Medina-Vargas, Monina C, NP  Calcium Carb-Cholecalciferol (OYSTER SHELL CALCIUM W/D) 500-5 MG-MCG TABS Take 1 tablet by mouth 2 (two) times daily. 11/06/23   [provider]  calcium-vitamin D (OSCAL WITH D) 500-200 MG-UNIT tablet Take 1 tablet by mouth 2 (two) times daily. 08/01/18   Medina-Vargas, Monina C, NP  carvedilol (COREG) 25 MG tablet Take 1 tablet (25 mg total) by mouth 2 (two) times daily with a meal.  08/01/18   Medina-Vargas, Monina C, NP  cetirizine (ZYRTEC) 10 MG tablet Take 10 mg by mouth 2 (two) times daily.    [provider]  cholecalciferol (VITAMIN D3) 25 MCG (1000 UNIT) tablet Take 1,000 Units by mouth daily.    [provider]  ferrous sulfate 325 (65 FE) MG tablet Take 1 tablet (325 mg total) by mouth daily with breakfast. 08/01/18   Medina-Vargas, Monina C, NP  folic acid (FOLVITE) 1 MG tablet Take 1 tablet (1 mg total) by mouth daily. 08/01/18   Medina-Vargas, Monina C, NP  furosemide (LASIX) 20 MG tablet Take 1 tablet (20 mg total) by mouth daily. Patient taking differently: Take 20 mg by mouth every Monday, Wednesday, and Friday. 09/30/21   Rodolph Bong, MD  levETIRAcetam (KEPPRA XR) 750 MG 24 hr tablet Take 2 tablets (1,500 mg total) by mouth at bedtime. 08/30/23   Levert Feinstein, MD  losartan (COZAAR) 25 MG tablet Take 25 mg by mouth daily. 10/06/22   [provider]  multivitamin (PROSIGHT) TABS tablet Take 1 tablet by mouth daily. 07/19/15   Drema Dallas, MD  pantoprazole (PROTONIX) 40 MG tablet Take 1 tablet (40 mg total) by mouth daily. 08/01/18   Medina-Vargas, Monina C, NP  rivaroxaban (XARELTO) 20 MG TABS tablet Take 1 tablet (20 mg total) by mouth daily with supper. 09/28/21   Rodolph Bong, MD  sodium bicarbonate 650 MG tablet Take 650 mg by mouth 2 (two) times daily. 10/12/22   [provider]  thiamine 100 MG tablet Take 1 tablet (100 mg total) by mouth daily. 08/01/18   Medina-Vargas, Monina C, NP      Allergies    Hydrochlorothiazide and Pseudoephedrine    Review of Systems   Review of Systems  HENT:  Positive for nosebleeds.   Ten systems reviewed and are negative for acute change, except as noted in the HPI.    Physical Exam Updated Vital Signs BP (!) 144/71 (BP Location: Right Arm)   Pulse 65   Temp 98 F (36.7 C) (Oral)   Resp 16   SpO2 96%   Physical Exam Vitals and nursing note reviewed.  Constitutional:       General: He is not in acute distress.    Appearance: He is well-developed. He is not diaphoretic.     Comments: Nontoxic-appearing and in no distress  HENT:     Head: Normocephalic and atraumatic.     Nose:     Comments: Macerated capillaries noted to the anterior left nare along the base with associated clot.  No active bleeding.  No septal deviation or hematoma.  Nares remain patent bilaterally. Eyes:     General: No scleral icterus.    Conjunctiva/sclera: Conjunctivae normal.  Pulmonary:     Effort: Pulmonary effort is normal. No respiratory distress.  Musculoskeletal:        General: Normal range of motion.     Cervical back: Normal range of motion.  Skin:    General: Skin is warm and dry.     Coloration: Skin is not pale.     Findings: No erythema or rash.  Neurological:     Mental Status: He is alert and oriented to person, place, and time.  Psychiatric:        Behavior: Behavior normal.     ED Results / Procedures / Treatments   Labs (all labs ordered are listed, but only abnormal results are displayed) Labs Reviewed - No data to display  EKG None  Radiology No results found.  Procedures Procedures    Medications Ordered in ED Medications - No data to display  ED Course/ Medical Decision Making/ A&P                                 Medical Decision Making  This patient presents to the ED for concern of L sided epistaxis, this involves an extensive number of treatment options, and is a complaint that carries with it a high risk of complications and morbidity.  The differential diagnosis includes uncomplicated nosebleed vs septal hematoma vs facial trauma vs uncontrolled HTN vs acute blood loss anemia.   Co morbidities that complicate the patient evaluation  ETOH abuse HLD HTN CKD Chronic anticoagulation   Additional history obtained:  Additional history obtained from wife, at bedside External records from outside source obtained and reviewed  including baseline anemia; Hgb ranges from 10-11.   Cardiac Monitoring:  The patient was maintained on a cardiac monitor.  I personally viewed and interpreted the cardiac monitored which showed an underlying rhythm of: NSR   Medicines ordered and prescription drug management:  I have reviewed the patients home medicines and have made adjustments as needed   Test Considered:  CBC   Problem List / ED Course:  L sided epistaxis, spontaneously resolved x 1 hour. VSS without tachycardia, hypotension. Doubt acute/emergent blood loss. No hx of facial trauma. No facial pain. No  fevers or facial swelling to suggest underlying infectious etiology.  Counseled on home epistaxis management, avoidance of nose blowing and picking x 48 hours. Encouraged return should symptoms recur.   Reevaluation:  After the interventions noted above, I reevaluated the patient and found that they have :stayed the same   Social Determinants of Health:  Good social support   Dispostion:  After consideration of the diagnostic results and the patients response to treatment, I feel that the patent would benefit from supportive care measures. OK to continue daily medications. Return precautions discussed and provided. Patient discharged in stable condition with no unaddressed concerns.          Final Clinical Impression(s) / ED Diagnoses Final diagnoses:  Left-sided epistaxis    Rx / DC Orders ED Discharge Orders     None         Antony Madura, PA-C 11/27/23 0000    Gilda Crease, MD 11/27/23 [phone]

## 2023-11-26 NOTE — ED Notes (Signed)
 Nose clamp placed on nose in triage

## 2023-11-30 ENCOUNTER — Ambulatory Visit: Payer: Medicare Other | Admitting: Neurology

## 2024-01-16 DIAGNOSIS — N184 Chronic kidney disease, stage 4 (severe): Secondary | ICD-10-CM | POA: Diagnosis not present

## 2024-01-24 DIAGNOSIS — R809 Proteinuria, unspecified: Secondary | ICD-10-CM | POA: Diagnosis not present

## 2024-01-24 DIAGNOSIS — I5042 Chronic combined systolic (congestive) and diastolic (congestive) heart failure: Secondary | ICD-10-CM | POA: Diagnosis not present

## 2024-01-24 DIAGNOSIS — D631 Anemia in chronic kidney disease: Secondary | ICD-10-CM | POA: Diagnosis not present

## 2024-01-24 DIAGNOSIS — I120 Hypertensive chronic kidney disease with stage 5 chronic kidney disease or end stage renal disease: Secondary | ICD-10-CM | POA: Diagnosis not present

## 2024-01-24 DIAGNOSIS — N185 Chronic kidney disease, stage 5: Secondary | ICD-10-CM | POA: Diagnosis not present

## 2024-01-24 DIAGNOSIS — N2581 Secondary hyperparathyroidism of renal origin: Secondary | ICD-10-CM | POA: Diagnosis not present

## 2024-01-24 DIAGNOSIS — E872 Acidosis, unspecified: Secondary | ICD-10-CM | POA: Diagnosis not present

## 2024-01-24 DIAGNOSIS — N189 Chronic kidney disease, unspecified: Secondary | ICD-10-CM | POA: Diagnosis not present

## 2024-01-24 DIAGNOSIS — I129 Hypertensive chronic kidney disease with stage 1 through stage 4 chronic kidney disease, or unspecified chronic kidney disease: Secondary | ICD-10-CM | POA: Diagnosis not present

## 2024-01-24 DIAGNOSIS — M1A00X0 Idiopathic chronic gout, unspecified site, without tophus (tophi): Secondary | ICD-10-CM | POA: Diagnosis not present

## 2024-01-24 DIAGNOSIS — D472 Monoclonal gammopathy: Secondary | ICD-10-CM | POA: Diagnosis not present

## 2024-02-03 ENCOUNTER — Other Ambulatory Visit (HOSPITAL_COMMUNITY): Payer: Self-pay | Admitting: *Deleted

## 2024-02-06 ENCOUNTER — Encounter (HOSPITAL_COMMUNITY)
Admission: RE | Admit: 2024-02-06 | Discharge: 2024-02-06 | Disposition: A | Discharge status: Home / Self Care | Source: Ambulatory Visit | Attending: Nephrology | Admitting: Nephrology

## 2024-02-06 DIAGNOSIS — N189 Chronic kidney disease, unspecified: Secondary | ICD-10-CM | POA: Diagnosis not present

## 2024-02-06 DIAGNOSIS — D631 Anemia in chronic kidney disease: Secondary | ICD-10-CM | POA: Insufficient documentation

## 2024-02-06 MED ORDER — SODIUM CHLORIDE 0.9 % IV SOLN
510.0000 mg | Freq: Once | INTRAVENOUS | Status: AC
  Administered 2024-02-06: 510 mg via INTRAVENOUS
  Filled 2024-02-06: qty 510

## 2024-02-07 DIAGNOSIS — I5032 Chronic diastolic (congestive) heart failure: Secondary | ICD-10-CM | POA: Diagnosis not present

## 2024-02-07 DIAGNOSIS — I132 Hypertensive heart and chronic kidney disease with heart failure and with stage 5 chronic kidney disease, or end stage renal disease: Secondary | ICD-10-CM | POA: Diagnosis not present

## 2024-02-07 DIAGNOSIS — I69354 Hemiplegia and hemiparesis following cerebral infarction affecting left non-dominant side: Secondary | ICD-10-CM | POA: Diagnosis not present

## 2024-02-07 DIAGNOSIS — D508 Other iron deficiency anemias: Secondary | ICD-10-CM | POA: Diagnosis not present

## 2024-02-07 DIAGNOSIS — R569 Unspecified convulsions: Secondary | ICD-10-CM | POA: Diagnosis not present

## 2024-02-07 DIAGNOSIS — N2581 Secondary hyperparathyroidism of renal origin: Secondary | ICD-10-CM | POA: Diagnosis not present

## 2024-02-07 DIAGNOSIS — D631 Anemia in chronic kidney disease: Secondary | ICD-10-CM | POA: Diagnosis not present

## 2024-02-07 DIAGNOSIS — M1A00X0 Idiopathic chronic gout, unspecified site, without tophus (tophi): Secondary | ICD-10-CM | POA: Diagnosis not present

## 2024-02-07 DIAGNOSIS — N185 Chronic kidney disease, stage 5: Secondary | ICD-10-CM | POA: Diagnosis not present

## 2024-02-07 DIAGNOSIS — E7800 Pure hypercholesterolemia, unspecified: Secondary | ICD-10-CM | POA: Diagnosis not present

## 2024-02-07 DIAGNOSIS — D472 Monoclonal gammopathy: Secondary | ICD-10-CM | POA: Diagnosis not present

## 2024-02-21 DIAGNOSIS — N185 Chronic kidney disease, stage 5: Secondary | ICD-10-CM | POA: Diagnosis not present

## 2024-02-26 DIAGNOSIS — I120 Hypertensive chronic kidney disease with stage 5 chronic kidney disease or end stage renal disease: Secondary | ICD-10-CM | POA: Diagnosis not present

## 2024-02-26 DIAGNOSIS — Z992 Dependence on renal dialysis: Secondary | ICD-10-CM | POA: Diagnosis not present

## 2024-02-26 DIAGNOSIS — N186 End stage renal disease: Secondary | ICD-10-CM | POA: Diagnosis not present

## 2024-02-27 ENCOUNTER — Telehealth: Payer: Self-pay | Admitting: Hematology and Oncology

## 2024-02-28 ENCOUNTER — Inpatient Hospital Stay: Payer: Medicare Other

## 2024-02-28 ENCOUNTER — Other Ambulatory Visit: Payer: Self-pay | Admitting: Hematology and Oncology

## 2024-02-28 DIAGNOSIS — N2581 Secondary hyperparathyroidism of renal origin: Secondary | ICD-10-CM | POA: Diagnosis not present

## 2024-02-28 DIAGNOSIS — N185 Chronic kidney disease, stage 5: Secondary | ICD-10-CM | POA: Diagnosis not present

## 2024-02-28 DIAGNOSIS — I120 Hypertensive chronic kidney disease with stage 5 chronic kidney disease or end stage renal disease: Secondary | ICD-10-CM | POA: Diagnosis not present

## 2024-02-28 DIAGNOSIS — M1A00X0 Idiopathic chronic gout, unspecified site, without tophus (tophi): Secondary | ICD-10-CM | POA: Diagnosis not present

## 2024-02-28 DIAGNOSIS — E872 Acidosis, unspecified: Secondary | ICD-10-CM | POA: Diagnosis not present

## 2024-02-28 DIAGNOSIS — D472 Monoclonal gammopathy: Secondary | ICD-10-CM

## 2024-02-28 DIAGNOSIS — D631 Anemia in chronic kidney disease: Secondary | ICD-10-CM | POA: Diagnosis not present

## 2024-02-28 DIAGNOSIS — I5042 Chronic combined systolic (congestive) and diastolic (congestive) heart failure: Secondary | ICD-10-CM | POA: Diagnosis not present

## 2024-02-28 DIAGNOSIS — R809 Proteinuria, unspecified: Secondary | ICD-10-CM | POA: Diagnosis not present

## 2024-02-29 ENCOUNTER — Telehealth: Payer: Self-pay

## 2024-02-29 ENCOUNTER — Other Ambulatory Visit: Payer: Self-pay

## 2024-02-29 ENCOUNTER — Inpatient Hospital Stay: Attending: Hematology and Oncology

## 2024-02-29 DIAGNOSIS — Z803 Family history of malignant neoplasm of breast: Secondary | ICD-10-CM | POA: Insufficient documentation

## 2024-02-29 DIAGNOSIS — D472 Monoclonal gammopathy: Secondary | ICD-10-CM | POA: Diagnosis not present

## 2024-02-29 DIAGNOSIS — D649 Anemia, unspecified: Secondary | ICD-10-CM | POA: Insufficient documentation

## 2024-02-29 DIAGNOSIS — Z992 Dependence on renal dialysis: Secondary | ICD-10-CM | POA: Insufficient documentation

## 2024-02-29 DIAGNOSIS — N189 Chronic kidney disease, unspecified: Secondary | ICD-10-CM | POA: Diagnosis not present

## 2024-02-29 DIAGNOSIS — N186 End stage renal disease: Secondary | ICD-10-CM

## 2024-02-29 LAB — LACTATE DEHYDROGENASE: LDH: 238 U/L — ABNORMAL HIGH (ref 98–192)

## 2024-02-29 LAB — CBC WITH DIFFERENTIAL (CANCER CENTER ONLY)
Abs Immature Granulocytes: 0.01 10*3/uL (ref 0.00–0.07)
Basophils Absolute: 0 10*3/uL (ref 0.0–0.1)
Basophils Relative: 0 %
Eosinophils Absolute: 0.1 10*3/uL (ref 0.0–0.5)
Eosinophils Relative: 3 %
HCT: 23.6 % — ABNORMAL LOW (ref 39.0–52.0)
Hemoglobin: 7.9 g/dL — ABNORMAL LOW (ref 13.0–17.0)
Immature Granulocytes: 0 %
Lymphocytes Relative: 19 %
Lymphs Abs: 0.8 10*3/uL (ref 0.7–4.0)
MCH: 27.4 pg (ref 26.0–34.0)
MCHC: 33.5 g/dL (ref 30.0–36.0)
MCV: 81.9 fL (ref 80.0–100.0)
Monocytes Absolute: 0.3 10*3/uL (ref 0.1–1.0)
Monocytes Relative: 8 %
Neutro Abs: 3 10*3/uL (ref 1.7–7.7)
Neutrophils Relative %: 70 %
Platelet Count: 130 10*3/uL — ABNORMAL LOW (ref 150–400)
RBC: 2.88 MIL/uL — ABNORMAL LOW (ref 4.22–5.81)
RDW: 18 % — ABNORMAL HIGH (ref 11.5–15.5)
WBC Count: 4.2 10*3/uL (ref 4.0–10.5)
nRBC: 0 % (ref 0.0–0.2)

## 2024-02-29 LAB — CMP (CANCER CENTER ONLY)
ALT: 9 U/L (ref 0–44)
AST: 14 U/L — ABNORMAL LOW (ref 15–41)
Albumin: 3.5 g/dL (ref 3.5–5.0)
Alkaline Phosphatase: 72 U/L (ref 38–126)
Anion gap: 8 (ref 5–15)
BUN: 83 mg/dL — ABNORMAL HIGH (ref 8–23)
CO2: 21 mmol/L — ABNORMAL LOW (ref 22–32)
Calcium: 7.8 mg/dL — ABNORMAL LOW (ref 8.9–10.3)
Chloride: 112 mmol/L — ABNORMAL HIGH (ref 98–111)
Creatinine: 5.17 mg/dL — ABNORMAL HIGH (ref 0.61–1.24)
GFR, Estimated: 11 mL/min — ABNORMAL LOW (ref >60)
Glucose, Bld: 113 mg/dL — ABNORMAL HIGH (ref 70–99)
Potassium: 4.6 mmol/L (ref 3.5–5.1)
Sodium: 141 mmol/L (ref 135–145)
Total Bilirubin: 0.3 mg/dL (ref 0.0–1.2)
Total Protein: 7.2 g/dL (ref 6.5–8.1)

## 2024-02-29 NOTE — Telephone Encounter (Signed)
 Received a call from [facility], stating that patient is in need of permanent access and TDC placement.    Last visit with Dr. Vikki Graves was 2/26 and Dr. Vikki Graves discussed surgical plan with patient at that time.

## 2024-02-29 NOTE — Telephone Encounter (Signed)
 Attempted to call for surgery scheduling. LVM

## 2024-03-01 ENCOUNTER — Other Ambulatory Visit: Payer: Self-pay

## 2024-03-01 ENCOUNTER — Encounter (HOSPITAL_COMMUNITY): Payer: Self-pay | Admitting: Vascular Surgery

## 2024-03-01 LAB — KAPPA/LAMBDA LIGHT CHAINS
Kappa free light chain: 111 mg/L — ABNORMAL HIGH (ref 3.3–19.4)
Kappa, lambda light chain ratio: 0.6 (ref 0.26–1.65)
Lambda free light chains: 185.8 mg/L — ABNORMAL HIGH (ref 5.7–26.3)

## 2024-03-01 NOTE — Progress Notes (Signed)
 SDW call  Patient's wife Mitchell [name] was given pre-op  instructions over the phone. She verbalized understanding of instructions provided.     PCP - Dr. Wilburn Handler Cardiologist - denies Neurologist: Dr. Phebe Brasil, wifes states last seizure was 2019 Pulmonary:    PPM/ICD - denies Device Orders - na Rep Notified - na   Chest x-ray - na EKG -  DOS, 03/02/2024 Stress Test - ECHO - 06/08/2018 Cardiac Cath -   Sleep Study/sleep apnea/CPAP: denies  Non-diabetic   Blood Thinner Instructions: Xarelto , states last day 02/28/2024 Aspirin  Instructions:denies   ERAS Protcol - NPO   Anesthesia review: Yes. Hemiplegia, HTN, stroke, CKD, seizures   Patient denies shortness of breath, fever, cough and chest pain over the phone call  Your procedure is scheduled on Friday March 02, 2024  Report to [facility] Main Entrance "A" at 1010   A.M., then check in with the Admitting office.  Call this number if you have problems the morning of surgery:  [phone]   If you have any questions prior to your surgery date call [phone]: Open Monday-Friday 8am-4pm If you experience any cold or flu symptoms such as cough, fever, chills, shortness of breath, etc. between now and your scheduled surgery, please notify us  at the above number    Remember:  Do not eat or drink after midnight the night before your surgery  Take these medicines the morning of surgery with A SIP OF WATER :  Allopurinol , amlodipine , carvedilol , zyrtec, protonix , sodium bicarb  As needed: tylenol   As of today, STOP taking any Aspirin  (unless otherwise instructed by your surgeon) Aleve, Naproxen, Ibuprofen, Motrin, Advil, Goody's, BC's, all herbal medications, fish oil, and all vitamins.

## 2024-03-01 NOTE — Progress Notes (Signed)
 Patient was called to be informed that the surgery time for tomorrow was changed to 12:00 o'clock. Patient and his wife were instructed to be at the hospital at 09:30 o'clock. Patient verbalized understanding.

## 2024-03-01 NOTE — Anesthesia Preprocedure Evaluation (Addendum)
 Anesthesia Evaluation  Patient identified by MRN, date of birth, ID band Patient awake    Reviewed: Allergy & Precautions, H&P , NPO status , Patient's Chart, lab work & pertinent test results  Airway Mallampati: II  TM Distance: >3 FB Neck ROM: Full    Dental no notable dental hx.    Pulmonary sleep apnea    Pulmonary exam normal breath sounds clear to auscultation       Cardiovascular hypertension, +CHF  Normal cardiovascular exam Rhythm:Regular Rate:Normal  Hx of cardiac arrest 2016   Neuro/Psych Seizures -,  PSYCHIATRIC DISORDERS      Hemiplegia affecting left dominant side CVA    GI/Hepatic negative GI ROS, Neg liver ROS,,,  Endo/Other  negative endocrine ROS    Renal/GU Renal disease  negative genitourinary   Musculoskeletal negative musculoskeletal ROS (+)    Abdominal   Peds negative pediatric ROS (+)  Hematology  (+) Blood dyscrasia, anemia   Anesthesia Other Findings   Reproductive/Obstetrics negative OB ROS                             Anesthesia Physical Anesthesia Plan  ASA: 4  Anesthesia Plan: General   Post-op Pain Management:    Induction: Intravenous  PONV Risk Score and Plan: 2 and Ondansetron   Airway Management Planned: LMA  Additional Equipment: None  Intra-op Plan:   Post-operative Plan: Extubation in OR  Informed Consent: I have reviewed the patients History and Physical, chart, labs and discussed the procedure including the risks, benefits and alternatives for the proposed anesthesia with the patient or authorized representative who has indicated his/her understanding and acceptance.     Dental advisory given  Plan Discussed with: CRNA  Anesthesia Plan Comments: (See PAT note written 03/01/2024 by Angelik Walls, PA-C   )       Anesthesia Quick Evaluation

## 2024-03-01 NOTE — Progress Notes (Signed)
 Anesthesia Chart Review: SAME DAY WORK-UP  Case: [id number] Date/Time: 03/02/24 1518   Procedures:      INSERTION, GRAFT, ARTERIOVENOUS, UPPER EXTREMITY (Right)     INSERTION OF DIALYSIS CATHETER   Anesthesia type: Choice   Diagnosis: ESRD (end stage renal disease) (HCC) [N18.6]   Pre-op  diagnosis: ESRD   [facility] ROOM 11 / [facility]   Surgeons: Adine Hoof, MD       DISCUSSION: Patient is a 73 year old male scheduled for the above procedure. Nephrology contacted [facility] on 02/29/24 due to patient needing both [facility] and permanent HD access for impending dialysis.  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  Last cardiology encounters noted were by Dr. Knox Perl during 06/2015 admission for vfib arrest with resultant anoxic brain injury. Notes suggest etiology of Vfib arrest not completely clear.  History of alcoholism, DVT (CTA negative for PE).  He had abdominal pain, went to the rest room, laid back in back and later wife noted change in breathing then unresponsiveness. He was in Vfib when EMS arrived. ROSC after CPR/epi/defib. He was down around 10-15 minutes and started on hypothermic protocol. He had a tracheal injury during intubation. LVEF was 20-25% with diffuse hypokinesis. 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 He did not have ischemic work-up due to critical state and anoxic brain injury with suspected poor prognosis. Last TTE seen is from 2019 when he was hospitalized for acute/subacute CVA.  06/08/18 echo  showed LVEF 30-35%, diffuse LV hypokinesis, grade 2 DD, mild AR.    His PCP is Dr. Wilburn Handler, but records currently unavailable.   Last Xarelto  02/28/24.   Reviewed with anesthesiologist Ellena Gurney, MD. VF cardiac arrest over 8 years ago. No known cardiology follow-up since then. Last EF still depressed at 30-35% in 2019.  He did tolerate prostate surgery in 2022. Now with progessive CKD and need for HD access. If HD initiation is imminent then would anticipate he could proceed with catheter placement; However, AVGG will likely be deferred until he can have cardiology evaluation. I spoke with Dr. Vikki Graves. He will further discuss with assigned anesthesiologist and discuss plan with patient on the day of surgery.    VS: Ht 5\' 7"  (1.702 m)   Wt 75.8 kg   BMI 26.16 kg/m  Wt Readings from Last 3 Encounters:  03/01/24 75.8 kg  08/29/23 76.7 kg  03/24/23 77.1 kg   BP Readings from Last 3 Encounters:  02/06/24 (!) 141/67  11/26/23 (!) 144/71  11/23/23 (!) 170/75   Pulse Readings from Last 3 Encounters:  02/06/24 (!) 55  11/26/23 65  11/23/23 69     PROVIDERS: Wilburn Handler, MD is PCP [facility]) Phebe Brasil, MD is neurologist  Festus Hubert, MD is HEM-ON   LABS: Most recent labs results in [facility] include: Lab Results  Component Value Date   WBC 4.2 02/29/2024   HGB 7.9 (L) 02/29/2024   HCT 23.6 (L) 02/29/2024   PLT 130 (L) 02/29/2024   GLUCOSE 113 (H) 02/29/2024   ALT 9 02/29/2024   AST 14 (L) 02/29/2024   NA 141 02/29/2024   K 4.6 02/29/2024  CL 112 (H) 02/29/2024   CREATININE 5.17 (H) 02/29/2024   BUN 83 (H) 02/29/2024   CO2 21 (L) 02/29/2024     IMAGES: DG Bone Survey 09/16/23: IMPRESSION: 1. No focal lytic lesion. 2. Advanced degenerative changes in the spine and extremities.   EKG: For day of surgery send.  Last EKG noted is from 10/14/2022: Right and left arm electrode reversal, interpretation assumes no reversal Sinus or ectopic atrial  rhythm Ventricular premature complex Prolonged PR interval Consider left atrial enlargement Consider left ventricular hypertrophy Probable lateral infarct, age indeterminate Confirmed by Eve Hinders [phone]) on 10/14/2022 11:25:00 PM   CV: BLE Venous US  06/17/18: Final Interpretation:  Right: Findings consistent with chronic deep vein thrombosis involving the  right common femoral vein, right femoral vein, and right proximal profunda  vein.  Left: Findings consistent with chronic deep vein thrombosis involving the  left common femoral vein, left femoral vein, left proximal profunda vein,  and left popliteal vein. There is no evidence of superficial venous  thrombosis.    Echo 06/08/18 (post CVA): Study Conclusions  - Left ventricle: The cavity size was normal. Wall thickness was    normal. Systolic function was moderately to severely reduced. The    estimated ejection fraction was in the range of 30% to 35%.    Diffuse hypokinesis. Features are consistent with a pseudonormal    left ventricular filling pattern, with concomitant abnormal    relaxation and increased filling pressure (grade 2 diastolic    dysfunction).  - Aortic valve: There was mild regurgitation.  - Comparison LVEF: 20-25% 07/11/15 post VF arrest; LVEF 30-35%, diffuse LV hypokinesis, akinesis of mid-apical inferolateral and inferior myocardium, grade 2 DD, mild AR/MR, PA peak pressure 37 mmHg   Carotid US  06/08/18: Final Interpretation:  - Right Carotid: Velocities in the right ICA are consistent with a 1-39% stenosis.  - Left Carotid: Velocities in the left ICA are consistent with a 1-39%  stenosis.  - Vertebrals: Bilateral vertebral arteries demonstrate antegrade flow.    Past Medical History:  Diagnosis Date   Alcohol abuse    Anemia    Anoxic brain injury (HCC)    Cardiac arrest (HCC) 07/08/2015   Chronic kidney disease    Chronic pulmonary edema    CVA (cerebral vascular accident) (HCC)    DVT  (deep venous thrombosis) (HCC)    12/04/12, 06/18/18   Dysphagia    Gout    Hemiplegia affecting left dominant side (HCC)    History of stroke    Hyperlipemia    Hypertension    IgG monoclonal gammopathy of undetermined significance (MGUS)    Seizures (HCC)    Stroke (HCC)    Thrombocytopenia (HCC)     Past Surgical History:  Procedure Laterality Date   lymph nodes     biopsy - benign   RADIOLOGY WITH ANESTHESIA N/A 07/17/2015   Procedure: RADIOLOGY WITH ANESTHESIA;  Surgeon: Medication Radiologist, MD;  Location: [facility];  Service: Radiology;  Laterality: N/A;   TRANSURETHRAL RESECTION OF PROSTATE N/A 09/26/2021   Procedure: TRANSURETHRAL RESECTION AND UNROOFING OF PROSTATIC ABSCESS;  Surgeon: Trent Frizzle, MD;  Location: [facility] ORS;  Service: Urology;  Laterality: N/A;    MEDICATIONS:  incobotulinumtoxinA  (XEOMIN ) 100 units injection 300 Units    acetaminophen  (TYLENOL ) 325 MG tablet   allopurinol  (ZYLOPRIM ) 100 MG tablet   amLODipine  (NORVASC ) 10 MG tablet   Calcium  Carb-Cholecalciferol  (OYSTER SHELL CALCIUM  W/D) 500-5 MG-MCG TABS   carvedilol  (COREG ) 25 MG tablet  cetirizine (ZYRTEC) 10 MG tablet   cholecalciferol  (VITAMIN D3) 25 MCG (1000 UNIT) tablet   ferrous sulfate  325 (65 FE) MG tablet   folic acid  (FOLVITE ) 1 MG tablet   furosemide  (LASIX ) 20 MG tablet   levETIRAcetam  (KEPPRA  XR) 750 MG 24 hr tablet   pantoprazole  (PROTONIX ) 40 MG tablet   rivaroxaban  (XARELTO ) 20 MG TABS tablet   sodium bicarbonate  650 MG tablet    Ella Gun, PA-C Surgical Short Stay/Anesthesiology [facility] Phone [phone] [facility] Phone [phone] 03/01/2024 4:23 PM

## 2024-03-02 ENCOUNTER — Ambulatory Visit (HOSPITAL_COMMUNITY)

## 2024-03-02 ENCOUNTER — Ambulatory Visit (HOSPITAL_COMMUNITY): Payer: Self-pay | Admitting: Vascular Surgery

## 2024-03-02 ENCOUNTER — Other Ambulatory Visit: Payer: Self-pay

## 2024-03-02 ENCOUNTER — Encounter (HOSPITAL_COMMUNITY): Payer: Self-pay | Admitting: Vascular Surgery

## 2024-03-02 ENCOUNTER — Encounter (HOSPITAL_COMMUNITY)
Admission: RE | Disposition: A | Discharge status: Home / Self Care | Payer: Self-pay | Source: Home / Self Care | Attending: Vascular Surgery

## 2024-03-02 ENCOUNTER — Ambulatory Visit (HOSPITAL_COMMUNITY)
Admission: RE | Admit: 2024-03-02 | Discharge: 2024-03-02 | Disposition: A | Discharge status: Home / Self Care | Attending: Vascular Surgery | Admitting: Vascular Surgery

## 2024-03-02 DIAGNOSIS — N186 End stage renal disease: Secondary | ICD-10-CM

## 2024-03-02 DIAGNOSIS — I4901 Ventricular fibrillation: Secondary | ICD-10-CM | POA: Diagnosis not present

## 2024-03-02 DIAGNOSIS — G4730 Sleep apnea, unspecified: Secondary | ICD-10-CM | POA: Insufficient documentation

## 2024-03-02 DIAGNOSIS — Z7901 Long term (current) use of anticoagulants: Secondary | ICD-10-CM | POA: Insufficient documentation

## 2024-03-02 DIAGNOSIS — Z992 Dependence on renal dialysis: Secondary | ICD-10-CM

## 2024-03-02 DIAGNOSIS — Z86718 Personal history of other venous thrombosis and embolism: Secondary | ICD-10-CM | POA: Insufficient documentation

## 2024-03-02 DIAGNOSIS — Z8674 Personal history of sudden cardiac arrest: Secondary | ICD-10-CM | POA: Diagnosis not present

## 2024-03-02 DIAGNOSIS — Z79899 Other long term (current) drug therapy: Secondary | ICD-10-CM | POA: Insufficient documentation

## 2024-03-02 DIAGNOSIS — I080 Rheumatic disorders of both mitral and aortic valves: Secondary | ICD-10-CM | POA: Insufficient documentation

## 2024-03-02 DIAGNOSIS — D631 Anemia in chronic kidney disease: Secondary | ICD-10-CM | POA: Diagnosis not present

## 2024-03-02 DIAGNOSIS — I132 Hypertensive heart and chronic kidney disease with heart failure and with stage 5 chronic kidney disease, or end stage renal disease: Secondary | ICD-10-CM

## 2024-03-02 DIAGNOSIS — I130 Hypertensive heart and chronic kidney disease with heart failure and stage 1 through stage 4 chronic kidney disease, or unspecified chronic kidney disease: Secondary | ICD-10-CM | POA: Diagnosis not present

## 2024-03-02 DIAGNOSIS — I69354 Hemiplegia and hemiparesis following cerebral infarction affecting left non-dominant side: Secondary | ICD-10-CM | POA: Diagnosis not present

## 2024-03-02 DIAGNOSIS — M109 Gout, unspecified: Secondary | ICD-10-CM | POA: Insufficient documentation

## 2024-03-02 DIAGNOSIS — I5022 Chronic systolic (congestive) heart failure: Secondary | ICD-10-CM | POA: Diagnosis not present

## 2024-03-02 DIAGNOSIS — N183 Chronic kidney disease, stage 3 unspecified: Secondary | ICD-10-CM | POA: Diagnosis not present

## 2024-03-02 DIAGNOSIS — I509 Heart failure, unspecified: Secondary | ICD-10-CM

## 2024-03-02 DIAGNOSIS — I493 Ventricular premature depolarization: Secondary | ICD-10-CM | POA: Diagnosis not present

## 2024-03-02 DIAGNOSIS — R569 Unspecified convulsions: Secondary | ICD-10-CM | POA: Diagnosis not present

## 2024-03-02 DIAGNOSIS — I5042 Chronic combined systolic (congestive) and diastolic (congestive) heart failure: Secondary | ICD-10-CM | POA: Diagnosis not present

## 2024-03-02 HISTORY — DX: Monoclonal gammopathy: D47.2

## 2024-03-02 HISTORY — DX: Acute embolism and thrombosis of unspecified deep veins of unspecified lower extremity: I82.409

## 2024-03-02 HISTORY — PX: INSERTION OF DIALYSIS CATHETER: SHX1324

## 2024-03-02 LAB — MULTIPLE MYELOMA PANEL, SERUM
Albumin SerPl Elph-Mcnc: 3.1 g/dL (ref 2.9–4.4)
Albumin/Glob SerPl: 0.9 (ref 0.7–1.7)
Alpha 1: 0.2 g/dL (ref 0.0–0.4)
Alpha2 Glob SerPl Elph-Mcnc: 0.8 g/dL (ref 0.4–1.0)
B-Globulin SerPl Elph-Mcnc: 1.1 g/dL (ref 0.7–1.3)
Gamma Glob SerPl Elph-Mcnc: 1.4 g/dL (ref 0.4–1.8)
Globulin, Total: 3.5 g/dL (ref 2.2–3.9)
IgA: 409 mg/dL (ref 61–437)
IgG (Immunoglobin G), Serum: 1436 mg/dL (ref 603–1613)
IgM (Immunoglobulin M), Srm: 82 mg/dL (ref 15–143)
M Protein SerPl Elph-Mcnc: 0.3 g/dL — ABNORMAL HIGH
Total Protein ELP: 6.6 g/dL (ref 6.0–8.5)

## 2024-03-02 LAB — POCT I-STAT, CHEM 8
BUN: 79 mg/dL — ABNORMAL HIGH (ref 8–23)
Calcium, Ion: 1.07 mmol/L — ABNORMAL LOW (ref 1.15–1.40)
Chloride: 113 mmol/L — ABNORMAL HIGH (ref 98–111)
Creatinine, Ser: 5.8 mg/dL — ABNORMAL HIGH (ref 0.61–1.24)
Glucose, Bld: 90 mg/dL (ref 70–99)
HCT: 23 % — ABNORMAL LOW (ref 39.0–52.0)
Hemoglobin: 7.8 g/dL — ABNORMAL LOW (ref 13.0–17.0)
Potassium: 4.7 mmol/L (ref 3.5–5.1)
Sodium: 143 mmol/L (ref 135–145)
TCO2: 19 mmol/L — ABNORMAL LOW (ref 22–32)

## 2024-03-02 LAB — TYPE AND SCREEN
ABO/RH(D): B POS
Antibody Screen: NEGATIVE

## 2024-03-02 SURGERY — INSERTION OF DIALYSIS CATHETER
Anesthesia: General | Site: Chest | Laterality: Right

## 2024-03-02 MED ORDER — ONDANSETRON HCL 4 MG/2ML IJ SOLN
INTRAMUSCULAR | Status: AC
Start: 2024-03-02 — End: ?
  Filled 2024-03-02: qty 2

## 2024-03-02 MED ORDER — LIDOCAINE-EPINEPHRINE (PF) 1 %-1:200000 IJ SOLN
INTRAMUSCULAR | Status: AC
  Filled 2024-03-02: qty 30

## 2024-03-02 MED ORDER — HEPARIN 6000 UNIT IRRIGATION SOLUTION
Status: DC | PRN
  Administered 2024-03-02: 1

## 2024-03-02 MED ORDER — LACTATED RINGERS IV SOLN: INTRAVENOUS | Status: DC

## 2024-03-02 MED ORDER — CHLORHEXIDINE GLUCONATE 4 % EX SOLN: 60.0000 mL | Freq: Once | CUTANEOUS | Status: DC

## 2024-03-02 MED ORDER — HEPARIN SODIUM (PORCINE) 1000 UNIT/ML IJ SOLN
INTRAMUSCULAR | Status: DC | PRN
  Administered 2024-03-02: 3000 [IU] via INTRAVENOUS

## 2024-03-02 MED ORDER — PROPOFOL 10 MG/ML IV BOLUS
INTRAVENOUS | Status: DC | PRN
  Administered 2024-03-02: 30 mg via INTRAVENOUS
  Administered 2024-03-02: 20 mg via INTRAVENOUS

## 2024-03-02 MED ORDER — PHENYLEPHRINE HCL-NACL 20-0.9 MG/250ML-% IV SOLN
INTRAVENOUS | Status: DC | PRN
  Administered 2024-03-02: 25 ug/min via INTRAVENOUS

## 2024-03-02 MED ORDER — FENTANYL CITRATE (PF) 250 MCG/5ML IJ SOLN
INTRAMUSCULAR | Status: DC | PRN
  Administered 2024-03-02 (×2): 50 ug via INTRAVENOUS

## 2024-03-02 MED ORDER — CEFAZOLIN SODIUM-DEXTROSE 2-4 GM/100ML-% IV SOLN
2.0000 g | INTRAVENOUS | Status: AC
  Administered 2024-03-02: 2 g via INTRAVENOUS
  Filled 2024-03-02: qty 100

## 2024-03-02 MED ORDER — LIDOCAINE 2% (20 MG/ML) 5 ML SYRINGE
INTRAMUSCULAR | Status: AC
  Filled 2024-03-02: qty 5

## 2024-03-02 MED ORDER — 0.9 % SODIUM CHLORIDE (POUR BTL) OPTIME
TOPICAL | Status: DC | PRN
  Administered 2024-03-02: 1000 mL

## 2024-03-02 MED ORDER — DEXAMETHASONE SODIUM PHOSPHATE 10 MG/ML IJ SOLN
INTRAMUSCULAR | Status: DC | PRN
  Administered 2024-03-02: 5 mg via INTRAVENOUS

## 2024-03-02 MED ORDER — CHLORHEXIDINE GLUCONATE 0.12 % MT SOLN
15.0000 mL | Freq: Once | OROMUCOSAL | Status: AC
  Administered 2024-03-02: 15 mL via OROMUCOSAL
  Filled 2024-03-02: qty 15

## 2024-03-02 MED ORDER — FENTANYL CITRATE (PF) 250 MCG/5ML IJ SOLN
INTRAMUSCULAR | Status: AC
  Filled 2024-03-02: qty 5

## 2024-03-02 MED ORDER — DEXAMETHASONE SODIUM PHOSPHATE 10 MG/ML IJ SOLN
INTRAMUSCULAR | Status: AC
  Filled 2024-03-02: qty 1

## 2024-03-02 MED ORDER — HEPARIN 6000 UNIT IRRIGATION SOLUTION
Status: AC
  Filled 2024-03-02: qty 500

## 2024-03-02 MED ORDER — LIDOCAINE 2% (20 MG/ML) 5 ML SYRINGE
INTRAMUSCULAR | Status: DC | PRN
  Administered 2024-03-02: 100 mg via INTRAVENOUS

## 2024-03-02 MED ORDER — NOREPINEPHRINE 4 MG/250ML-% IV SOLN
INTRAVENOUS | Status: AC
Start: 2024-03-02 — End: ?
  Filled 2024-03-02: qty 250

## 2024-03-02 MED ORDER — SODIUM CHLORIDE 0.9% FLUSH: 3.0000 mL | INTRAVENOUS | Status: DC | PRN

## 2024-03-02 MED ORDER — ONDANSETRON HCL 4 MG/2ML IJ SOLN
INTRAMUSCULAR | Status: DC | PRN
  Administered 2024-03-02: 4 mg via INTRAVENOUS

## 2024-03-02 MED ORDER — PROPOFOL 10 MG/ML IV BOLUS
INTRAVENOUS | Status: AC
  Filled 2024-03-02: qty 20

## 2024-03-02 MED ORDER — ORAL CARE MOUTH RINSE: 15.0000 mL | Freq: Once | OROMUCOSAL | Status: AC

## 2024-03-02 MED ORDER — HEPARIN SODIUM (PORCINE) 1000 UNIT/ML IJ SOLN
INTRAMUSCULAR | Status: AC
  Filled 2024-03-02: qty 10

## 2024-03-02 MED ORDER — SODIUM CHLORIDE 0.9 % IV SOLN: INTRAVENOUS | Status: DC | PRN

## 2024-03-02 SURGICAL SUPPLY — 37 items
BAG DECANTER FOR FLEXI CONT (MISCELLANEOUS) ×3 IMPLANT
BENZOIN TINCTURE PRP APPL 2/3 (GAUZE/BANDAGES/DRESSINGS) ×3 IMPLANT
BIOPATCH RED 1 DISK 7.0 (GAUZE/BANDAGES/DRESSINGS) ×3 IMPLANT
CATH PALINDROME-P 19CM W/VT (CATHETERS) ×1 IMPLANT
CATH PALINDROME-P 28CM W/VT (CATHETERS) IMPLANT
COVER PROBE W GEL 5X96 (DRAPES) ×3 IMPLANT
COVER SURGICAL LIGHT HANDLE (MISCELLANEOUS) ×3 IMPLANT
DERMABOND ADVANCED .7 DNX6 (GAUZE/BANDAGES/DRESSINGS) ×1 IMPLANT
DRAPE C-ARM 42X72 X-RAY (DRAPES) ×3 IMPLANT
DRAPE CHEST BREAST 15X10 FENES (DRAPES) ×3 IMPLANT
GAUZE 4X4 16PLY ~~LOC~~+RFID DBL (SPONGE) ×3 IMPLANT
GAUZE SPONGE 4X4 12PLY STRL (GAUZE/BANDAGES/DRESSINGS) ×3 IMPLANT
GLOVE BIO SURGEON STRL SZ8 (GLOVE) ×3 IMPLANT
GOWN STRL REUS W/ TWL LRG LVL3 (GOWN DISPOSABLE) ×3 IMPLANT
GOWN STRL REUS W/ TWL XL LVL3 (GOWN DISPOSABLE) ×3 IMPLANT
KIT BASIN OR (CUSTOM PROCEDURE TRAY) ×3 IMPLANT
KIT PALINDROME-P 55CM (CATHETERS) IMPLANT
KIT TURNOVER KIT B (KITS) ×3 IMPLANT
NDL 18GX1X1/2 (RX/OR ONLY) (NEEDLE) ×2 IMPLANT
NDL HYPO 25GX1X1/2 BEV (NEEDLE) ×2 IMPLANT
NEEDLE 18GX1X1/2 (RX/OR ONLY) (NEEDLE) ×2 IMPLANT
NEEDLE HYPO 25GX1X1/2 BEV (NEEDLE) ×2 IMPLANT
NS IRRIG 1000ML POUR BTL (IV SOLUTION) ×3 IMPLANT
PACK SRG BSC III STRL LF ECLPS (CUSTOM PROCEDURE TRAY) ×3 IMPLANT
PAD ARMBOARD POSITIONER FOAM (MISCELLANEOUS) ×6 IMPLANT
SET MICROPUNCTURE 5F STIFF (MISCELLANEOUS) ×3 IMPLANT
SOAP 2 % CHG 4 OZ (WOUND CARE) ×3 IMPLANT
STRIP CLOSURE SKIN 1/2X4 (GAUZE/BANDAGES/DRESSINGS) ×3 IMPLANT
SUT ETHILON 3 0 PS 1 (SUTURE) ×3 IMPLANT
SUT MNCRL AB 4-0 PS2 18 (SUTURE) ×3 IMPLANT
SYR 10ML LL (SYRINGE) ×3 IMPLANT
SYR 20ML LL LF (SYRINGE) ×6 IMPLANT
SYR 5ML LL (SYRINGE) ×3 IMPLANT
SYR CONTROL 10ML LL (SYRINGE) ×3 IMPLANT
TOWEL GREEN STERILE (TOWEL DISPOSABLE) ×3 IMPLANT
TOWEL GREEN STERILE FF (TOWEL DISPOSABLE) ×6 IMPLANT
WATER STERILE IRR 1000ML POUR (IV SOLUTION) ×3 IMPLANT

## 2024-03-02 NOTE — Op Note (Signed)
    OPERATIVE NOTE  PROCEDURE:   Ultrasound and fluoroscopic guided tunneled dialysis catheter insertion, right IJ  PRE-OPERATIVE DIAGNOSIS: ESRD, initiating dialysis  POST-OPERATIVE DIAGNOSIS: same as above   SURGEON: Philipp Brawn MD  ASSISTANT(S): None  ANESTHESIA: general  ESTIMATED BLOOD LOSS: Minimal cc  FINDING(S): Compressible and widely patent right IJ  Placement of tunneled dialysis catheter with tip at atriocaval junction  SPECIMEN(S): None  INDICATIONS:   Mitchell Bruso Sr. is a 73 y.o. male with CKD stage V and significant CHF.  He was seen in clinic a few months ago and options for HD were discussed.  At that time they were not interested in pursuing.  Now he is in need of hemodialysis and does not have access.  Risks and benefits of tunneled dialysis catheter insertion were reviewed, he and his wife expressed understanding and elected to proceed.  DESCRIPTION: The patient was brought to the operating room positioned supine on operating room table.  The neck was prepped and draped in the usual sterile fashion.  Anesthesia was induced, preoperative antibiotics were administered and a timeout was performed. Using ultrasound guidance the right internal jugular vein was accessed with micropuncture technique.  Through the micropuncture sheath, the guidewire was advanced into the superior vena cava.  A small incision was made around the skin access point.  The access point was serially dilated under direct fluoroscopic guidance.  A peel-away sheath was introduced into the superior vena cava under fluoroscopic guidance.  A counterincision was made in the chest under the clavicle.  A 19 cm tunnel dialysis catheter was then tunneled under the skin, over the clavicle into the incision in the neck.  The tunneling device was removed and the catheter fed through the peel-away sheath into the superior vena cava.  The peel-away sheath was removed and the catheter gently pulled back.   Adequate position was confirmed with x-ray.  The catheter was tested and found to aspirate and flush with ease.    The catheter was sutured to the skin and the neck incision was closed with 4-0 Monocryl.  The catheter was then capped and heparin  locked.  COMPLICATIONS: None apparent  CONDITION: Stable  Philipp Brawn MD [facility] Phone Number: [phone] 03/02/2024 2:31 PM

## 2024-03-02 NOTE — Anesthesia Postprocedure Evaluation (Signed)
 Anesthesia Post Note  Patient: Twilla Galea Sr.  Procedure(s) Performed: INSERTION OF DIALYSIS CATHETER (Right: Chest)     Patient location during evaluation: PACU Anesthesia Type: General Level of consciousness: awake and alert Pain management: pain level controlled Vital Signs Assessment: post-procedure vital signs reviewed and stable Respiratory status: spontaneous breathing, nonlabored ventilation, respiratory function stable and patient connected to nasal cannula oxygen Cardiovascular status: blood pressure returned to baseline and stable Postop Assessment: no apparent nausea or vomiting Anesthetic complications: no   No notable events documented.  Last Vitals:  Vitals:   03/02/24 1430 03/02/24 1445  BP: (!) 153/79 (!) 144/82  Pulse: 67 66  Resp: 12 14  Temp:  36.6 C  SpO2: 92% 100%    Last Pain:  Vitals:   03/02/24 1445  TempSrc:   PainSc: 0-No pain                 Lethaniel Rave

## 2024-03-02 NOTE — Transfer of Care (Signed)
 Immediate Anesthesia Transfer of Care Note  Patient: Mitchell [name].  Procedure(s) Performed: INSERTION OF DIALYSIS CATHETER (Right: Chest)  Patient Location: PACU  Anesthesia Type:General  Level of Consciousness: awake and drowsy  Airway & Oxygen Therapy: Patient Spontanous Breathing and Patient connected to face mask oxygen  Post-op Assessment: Report given to RN and Post -op Vital signs reviewed and stable  Post vital signs: Reviewed and stable  Last Vitals:  Vitals Value Taken Time  BP 139/77 03/02/24 1419  Temp    Pulse 63 03/02/24 1421  Resp 10 03/02/24 1421  SpO2 94 % 03/02/24 1421  Vitals shown include unfiled device data.  Last Pain:  Vitals:   03/02/24 1032  TempSrc:   PainSc: 0-No pain         Complications: No notable events documented.

## 2024-03-02 NOTE — H&P (Signed)
 Patient seen and examined.  No complaints. No changes to medication history or physical exam since last seen. After discussing the risks and benefits of TDC insertion, Mitchell Galea Sr. elected to proceed.   Philipp Brawn MD      Patient ID: Mitchell Galea Sr., male   DOB: 1951-02-07, 73 y.o.   MRN: [id number]   Reason for Consult: New Patient (Initial Visit)   Referred by Wilburn Handler, MD   Subjective:    Subjective HPI:   Mitchell Mustard Sr. is a 73 y.o. male history of chronic kidney disease with a history of stroke.  He is right-hand dominant and is mostly debilitated in the left upper extremity.  He does have a history of lymph node surgery in the left axilla and no other left upper extremity surgeries.  He is on Xarelto .  He has never been on dialysis.       Past Medical History:  Diagnosis Date   Alcohol abuse     Anemia     Anoxic brain injury (HCC)     Chronic kidney disease     Chronic pulmonary edema     CVA (cerebral vascular accident) (HCC)     Dysphagia     Gout     Hemiplegia affecting left dominant side (HCC)     History of stroke     Hyperlipemia     Hypertension     Seizures (HCC)     Stroke (HCC)     Thrombocytopenia (HCC)               Family History  Problem Relation Age of Onset   Aneurysm Mother     Heart attack Father     Aneurysm Sister     Breast cancer Sister          diagnosed older than 3 years of age   Clotting disorder Brother               Past Surgical History:  Procedure Laterality Date   lymph nodes        biopsy - benign   RADIOLOGY WITH ANESTHESIA N/A 07/17/2015    Procedure: RADIOLOGY WITH ANESTHESIA;  Surgeon: Medication Radiologist, MD;  Location: [facility];  Service: Radiology;  Laterality: N/A;   TRANSURETHRAL RESECTION OF PROSTATE N/A 09/26/2021    Procedure: TRANSURETHRAL RESECTION AND UNROOFING OF PROSTATIC ABSCESS;  Surgeon: Trent Frizzle, MD;  Location: [facility]S;  Service: Urology;  Laterality: N/A;          Short  Social History:  Social History         Tobacco Use   Smoking status: Never   Smokeless tobacco: Never  Substance Use Topics   Alcohol use: Never      Comment: History of alcohol abuse      Allergies       Allergies  Allergen Reactions   Hydrochlorothiazide        precipitates gout   Pseudoephedrine Rash              Current Outpatient Medications  Medication Sig Dispense Refill   acetaminophen  (TYLENOL ) 325 MG tablet Take 2 tablets (650 mg total) by mouth every 6 (six) hours as needed for mild pain, moderate pain or fever (or Fever >/= 101). (Patient taking differently: Take 325-650 mg by mouth every 6 (six) hours as needed for mild pain (pain score 1-3), moderate pain (pain score 4-6) or fever (or Fever >/= 101).)  allopurinol  (ZYLOPRIM ) 100 MG tablet Take 100 mg by mouth daily.       amLODipine  (NORVASC ) 10 MG tablet Take 1 tablet (10 mg total) by mouth daily. 30 tablet 0   Calcium  Carb-Cholecalciferol  (OYSTER SHELL CALCIUM  W/D) 500-5 MG-MCG TABS Take 1 tablet by mouth 2 (two) times daily.       calcium -vitamin D  (OSCAL WITH D) 500-200 MG-UNIT tablet Take 1 tablet by mouth 2 (two) times daily. 60 tablet 0   carvedilol  (COREG ) 25 MG tablet Take 1 tablet (25 mg total) by mouth 2 (two) times daily with a meal. 60 tablet 0   cetirizine (ZYRTEC) 10 MG tablet Take 10 mg by mouth 2 (two) times daily.       cholecalciferol  (VITAMIN D3) 25 MCG (1000 UNIT) tablet Take 1,000 Units by mouth daily.       ferrous sulfate  325 (65 FE) MG tablet Take 1 tablet (325 mg total) by mouth daily with breakfast. 30 tablet 0   folic acid  (FOLVITE ) 1 MG tablet Take 1 tablet (1 mg total) by mouth daily. 30 tablet 0   furosemide  (LASIX ) 20 MG tablet Take 1 tablet (20 mg total) by mouth daily. (Patient taking differently: Take 20 mg by mouth every Monday, Wednesday, and Friday.) 30 tablet 0   levETIRAcetam  (KEPPRA  XR) 750 MG 24 hr tablet Take 2 tablets (1,500 mg total) by mouth at bedtime. 180 tablet 4    losartan (COZAAR) 25 MG tablet Take 25 mg by mouth daily.       multivitamin (PROSIGHT) TABS tablet Take 1 tablet by mouth daily. 30 each 0   pantoprazole  (PROTONIX ) 40 MG tablet Take 1 tablet (40 mg total) by mouth daily. 30 tablet 0   rivaroxaban  (XARELTO ) 20 MG TABS tablet Take 1 tablet (20 mg total) by mouth daily with supper. 30 tablet 0   sodium bicarbonate  650 MG tablet Take 650 mg by mouth 2 (two) times daily.       thiamine  100 MG tablet Take 1 tablet (100 mg total) by mouth daily. 30 tablet 0               Current Facility-Administered Medications  Medication Dose Route Frequency Provider Last Rate Last Admin   incobotulinumtoxinA  (XEOMIN ) 100 units injection 300 Units  300 Units Intramuscular Q90 days Phebe Brasil, MD   300 Units at 08/30/23 1412        Review of Systems  Constitutional:  Constitutional negative. HENT: HENT negative.  Eyes: Eyes negative.  Respiratory: Respiratory negative.  Cardiovascular: Cardiovascular negative.  GI: Gastrointestinal negative.  Musculoskeletal: Musculoskeletal negative.  Skin: Skin negative.  Neurological: Positive for focal weakness.  Hematologic: Hematologic/lymphatic negative.  Psychiatric: Psychiatric negative.          Objective:    Objective[] Expand by Default    Vitals:    11/23/23 0828  BP: (!) 170/75  Pulse: 69  Temp: 98.3 F (36.8 C)  SpO2: 97%  Height: 5\' 8"  (1.727 m)    Body mass index is 25.7 kg/m.   Physical Exam HENT:     Head: Normocephalic.     Nose: Nose normal.  Eyes:     Pupils: Pupils are equal, round, and reactive to light.  Cardiovascular:     Pulses:          Radial pulses are 2+ on the right side and 2+ on the left side.  Pulmonary:     Effort: Pulmonary effort is normal.  Abdominal:     General:  Abdomen is flat.  Musculoskeletal:        General: Normal range of motion.     Right lower leg: No edema.     Left lower leg: No edema.  Neurological:     Mental Status: He is alert.         Data: Right Pre-Dialysis Findings:  +-----------------------+----------+--------------------+---------+--------  +  Location              PSV (cm/s)Intralum. Diam. (cm)Waveform  Comments  +-----------------------+----------+--------------------+---------+--------  +  Brachial Antecub. fossa101       0.61                triphasic           +-----------------------+----------+--------------------+---------+--------  +  Radial Art at Wrist    81        0.34                triphasic           +-----------------------+----------+--------------------+---------+--------  +  Ulnar Art at Wrist     78        0.20                triphasic             Left Pre-Dialysis Findings:  +-----------------------+----------+--------------------+---------+--------  +  Location              PSV (cm/s)Intralum. Diam. (cm)Waveform  Comments  +-----------------------+----------+--------------------+---------+--------  +  Brachial Antecub. fossa65        0.56                triphasic           +-----------------------+----------+--------------------+---------+--------  +  Radial Art at Wrist    76        0.31                triphasic           +-----------------------+----------+--------------------+---------+--------  +  Ulnar Art at Wrist     62        0.16                triphasic            Right Cephalic   Diameter (cm)Depth (cm)Findings  +-----------------+-------------+----------+--------+  Shoulder            0.15                         +-----------------+-------------+----------+--------+  Prox upper arm       0.18                         +-----------------+-------------+----------+--------+  Mid upper arm        0.17                         +-----------------+-------------+----------+--------+  Dist upper arm    0.12 / 0.15                     +-----------------+-------------+----------+--------+   Antecubital fossa    0.29                         +-----------------+-------------+----------+--------+  Prox forearm      0.19 / 0.31                     +-----------------+-------------+----------+--------+  Mid forearm          0.21                         +-----------------+-------------+----------+--------+  Dist forearm         0.15                         +-----------------+-------------+----------+--------+   +-----------------+-------------+----------+-------------------------------  ---+  Right Basilic    Diameter (cm)Depth (cm)             Findings                +-----------------+-------------+----------+-------------------------------  ---+  Shoulder            0.17                                                    +-----------------+-------------+----------+-------------------------------  ---+  Mid upper arm     0.50 / 0.19           appears to go posterior to  artery.  +-----------------+-------------+----------+-------------------------------  ---+  Dist upper arm       0.16                                                    +-----------------+-------------+----------+-------------------------------  ---+  Antecubital fossa    0.18                                                    +-----------------+-------------+----------+-------------------------------  ---+  Prox forearm         0.17                                                    +-----------------+-------------+----------+-------------------------------  ---+   +-----------------+-------------+----------+-------------+  Left Cephalic    Diameter (cm)Depth (cm)  Findings     +-----------------+-------------+----------+-------------+  Prox upper arm       0.20                              +-----------------+-------------+----------+-------------+  Mid upper arm        0.14                               +-----------------+-------------+----------+-------------+  Dist upper arm       0.00       15.00        NWV       +-----------------+-------------+----------+-------------+  Antecubital fossa 0.34 / 0.29                          +-----------------+-------------+----------+-------------+  Prox forearm         0.20                              +-----------------+-------------+----------+-------------+  Mid forearm          0.17               mid to dst fa  +-----------------+-------------+----------+-------------+  Dist forearm         0.16                              +-----------------+-------------+----------+-------------+   +-----------------+-------------+----------+--------+  Left Basilic     Diameter (cm)Depth (cm)Findings  +-----------------+-------------+----------+--------+  Mid upper arm        0.22                         +-----------------+-------------+----------+--------+  Dist upper arm       0.27                         +-----------------+-------------+----------+--------+  Antecubital fossa    0.22                         +-----------------+-------------+----------+--------+  Prox forearm         0.28                         +-----------------+-------------+----------+--------+           Assessment/Plan:    Assessment 73 year old male with history of stroke with left hemiplegia was previously right-hand dominant does not appear to have suitable vein for access creation.  I discussed his options being TDC versus graft with or without AC.  If he is closer to dialysis we will plan to place the graft 2 to 4 weeks prior to dialysis need to avoid TDC.  This was discussed with the family and they demonstrate good understanding.       Adine Hoof MD [facility]

## 2024-03-02 NOTE — Anesthesia Procedure Notes (Signed)
 Procedure Name: LMA Insertion Date/Time: 03/02/2024 1:40 PM  Performed by: Leandro Proffer, CRNAPre-anesthesia Checklist: Patient identified, Emergency Drugs available, Suction available and Patient being monitored Patient Re-evaluated:Patient Re-evaluated prior to induction Oxygen Delivery Method: Circle system utilized Preoxygenation: Pre-oxygenation with 100% oxygen Induction Type: IV induction Ventilation: Mask ventilation without difficulty LMA Size: 4.0 Tube type: Oral Number of attempts: 1 Airway Equipment and Method: Oral airway Placement Confirmation: positive ETCO2 and breath sounds checked- equal and bilateral Tube secured with: Tape Dental Injury: Teeth and Oropharynx as per pre-operative assessment

## 2024-03-03 ENCOUNTER — Encounter (HOSPITAL_COMMUNITY): Payer: Self-pay | Admitting: Vascular Surgery

## 2024-03-05 ENCOUNTER — Telehealth: Payer: Self-pay | Admitting: Hematology and Oncology

## 2024-03-05 ENCOUNTER — Telehealth: Payer: Self-pay

## 2024-03-05 NOTE — Telephone Encounter (Signed)
 Faxed cardiac clearance request to [facility].

## 2024-03-05 NOTE — Telephone Encounter (Signed)
   Pre-operative Risk Assessment    Patient Name: Mitchell [name].  DOB: [birth year] MRN: [id number]   Date of last office visit: None Date of next office visit: None *Needs new patient appt*    Request for Surgical Clearance    Procedure:  AVG Placement for Dialysis  Date of Surgery:  Clearance TBD                                Surgeon:  Dr. Delaney Fearing  Surgeon's Group or Practice Name:  [facility]  Phone number:  [phone] Fax number:  [phone]   Type of Clearance Requested:   - Medical  - Pharmacy:  Hold Rivaroxaban  (Xarelto ) not indicated   Type of Anesthesia:  Choice   Additional requests/questions:    Kenny Peals   03/05/2024, 3:24 PM

## 2024-03-05 NOTE — Telephone Encounter (Signed)
 Pt is going to need a new pt appt for preop clearance. I will check with our scheduling team to see if they can get the pt scheduled new appt for preop clearance.

## 2024-03-05 NOTE — Telephone Encounter (Signed)
   Name: Iverson Sees.  DOB: December 10, 1950  MRN: [id number]  Primary Cardiologist: None  Chart reviewed as part of pre-operative protocol coverage. Because of Dquan Cortopassi Sr.'s past medical history and time since last visit, he will require a follow-up in-office visit in order to better assess preoperative cardiovascular risk.  Pre-op  covering staff: - Please schedule appointment and call patient to inform them. If patient already had an upcoming appointment within acceptable timeframe, please add "pre-op  clearance" to the appointment notes so provider is aware. - Please contact requesting surgeon's office via preferred method (i.e, phone, fax) to inform them of need for appointment prior to surgery.  Cardiology service does not prescribe Xarelto  to this patient.  Please reach out to prescribing office for recommendation.  Ava Boatman, NP  03/05/2024, 4:41 PM

## 2024-03-06 ENCOUNTER — Ambulatory Visit: Payer: Medicare Other | Admitting: Physician Assistant

## 2024-03-06 ENCOUNTER — Inpatient Hospital Stay: Admitting: Hematology and Oncology

## 2024-03-06 DIAGNOSIS — D472 Monoclonal gammopathy: Secondary | ICD-10-CM | POA: Diagnosis not present

## 2024-03-06 DIAGNOSIS — N186 End stage renal disease: Secondary | ICD-10-CM | POA: Diagnosis not present

## 2024-03-06 DIAGNOSIS — Z992 Dependence on renal dialysis: Secondary | ICD-10-CM | POA: Diagnosis not present

## 2024-03-06 DIAGNOSIS — N2581 Secondary hyperparathyroidism of renal origin: Secondary | ICD-10-CM | POA: Diagnosis not present

## 2024-03-06 DIAGNOSIS — Z23 Encounter for immunization: Secondary | ICD-10-CM | POA: Diagnosis not present

## 2024-03-06 DIAGNOSIS — D509 Iron deficiency anemia, unspecified: Secondary | ICD-10-CM | POA: Diagnosis not present

## 2024-03-06 DIAGNOSIS — D631 Anemia in chronic kidney disease: Secondary | ICD-10-CM | POA: Diagnosis not present

## 2024-03-07 ENCOUNTER — Inpatient Hospital Stay (HOSPITAL_BASED_OUTPATIENT_CLINIC_OR_DEPARTMENT_OTHER): Admitting: Hematology and Oncology

## 2024-03-07 VITALS — BP 150/77 | HR 80 | Temp 97.5°F | Resp 16 | Wt 167.1 lb

## 2024-03-07 DIAGNOSIS — D472 Monoclonal gammopathy: Secondary | ICD-10-CM

## 2024-03-07 DIAGNOSIS — N189 Chronic kidney disease, unspecified: Secondary | ICD-10-CM | POA: Diagnosis not present

## 2024-03-07 DIAGNOSIS — Z992 Dependence on renal dialysis: Secondary | ICD-10-CM | POA: Diagnosis not present

## 2024-03-07 DIAGNOSIS — Z803 Family history of malignant neoplasm of breast: Secondary | ICD-10-CM | POA: Diagnosis not present

## 2024-03-07 DIAGNOSIS — D649 Anemia, unspecified: Secondary | ICD-10-CM | POA: Diagnosis not present

## 2024-03-07 NOTE — Telephone Encounter (Signed)
 Pt has new pt appt with Dr. Lavonne Prairie 03/08/24 for preop clearance.   I will update all parties involved of appt.

## 2024-03-07 NOTE — Progress Notes (Signed)
 [facility] [phone]   [phone]  PROGRESS NOTE  Patient Care Team: Leigh Lung, MD as PCP - General (Family Medicine) Tish Elsie FALCON, MD as Consulting Physician (Internal Medicine) Leigh Lung, MD (Family Medicine) Morgan Clayborne CROME, RN as [facility] Care Management  Hematological/Oncological History 06/29/2022: Labs from nephrologist, Dr. Katheryn Saba: SPEP detected M spike measuring 0.3 g/dL. IFE showed IgG monoclonal protein with lambda light chain specificity. Kappa 66.1, Lambda 94.3, Ratio 0.70. Creatinine 2.49, Calcium  9.0, Hgb 11.4. 08/11/2022: Establish care with [facility] with Dr. Norleen Kidney and [place] Police PA-C 09/29/2022: Kidney biopsy reveled severe chronic injury compatible with vascular etiology. No evidence of paraprotein-related renal disease.   CHIEF COMPLAINTS/PURPOSE OF CONSULTATION:  IgG Lambda MGUS   HISTORY OF PRESENTING ILLNESS:  Mitchell Simpler Sr. 73 y.o. 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 Mitchell [name] is accompanied by his wife for this visit.   On exam today, [name] reports Mitchell [name] has been well overall in interim since her last visit 6 months ago.  Mitchell [name] is currently undergoing dialysis on Tuesday, Thursday, Saturday.  Mitchell [name] reports Mitchell [name] follows with Dr. Saba.  Mitchell [name] is doing his best to try to drink plenty of water .  Mitchell [name] reports lately has been feeling more tired and wiped out.  Mitchell [name] notes that Mitchell [name] has been getting iron and EPO injections with his nephrologist to the best of his knowledge.  Mitchell [name] reports Mitchell [name] is not been having any trouble with bleeding, bruising, or dark stools.  Mitchell [name] notes Mitchell [name] is not having any trouble with infectious symptoms such as runny nose, sore throat, cough.  Mitchell [name] denies any bone or back pain.  Full 10 point ROS is otherwise negative.  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)    Seizures (HCC)    Stroke (HCC)    Thrombocytopenia (HCC)     SURGICAL HISTORY: Past Surgical History:  Procedure Laterality Date   INSERTION OF DIALYSIS CATHETER Right 03/02/2024   Procedure: INSERTION OF DIALYSIS CATHETER;  Surgeon: Pearline Norman RAMAN, MD;  Location: [facility] OR;  Service: Vascular;  Laterality: Right;   lymph nodes     biopsy - benign   RADIOLOGY WITH ANESTHESIA N/A 07/17/2015   Procedure: RADIOLOGY WITH ANESTHESIA;  Surgeon: Medication Radiologist, MD;  Location: [facility] OR;  Service: Radiology;  Laterality: N/A;   TRANSURETHRAL RESECTION OF PROSTATE N/A 09/26/2021   Procedure: TRANSURETHRAL RESECTION AND UNROOFING OF PROSTATIC ABSCESS;  Surgeon: Matilda Senior, MD;  Location: [facility] ORS;  Service: Urology;  Laterality: N/A;    SOCIAL HISTORY: Social History   Socioeconomic History   Marital status: Married    Spouse name: Not on file   Number of children: 2   Years of education: 11th grade   Highest education level: Not on file  Occupational History   Occupation: Retired  Tobacco Use   Smoking status: Never   Smokeless tobacco: Never  Vaping Use   Vaping status: Never Used  Substance and Sexual Activity   Alcohol use: Never    Comment: History of alcohol abuse   Drug use: Not Currently    Types: Marijuana   Sexual activity: Never  Other Topics Concern   Not on file  Social History Narrative   ** Merged History Encounter **       Mitchell [name] is currently in rehab following a fall. Mitchell [name] is hoping to be discharged home soon.  Lives with his wife at home. Right-handed. Drinks decaf coffee   Social Drivers of Research scientist (physical sciences) Strain: Not on file  Food Insecurity: No Food Insecurity (07/27/2022)   Hunger Vital Sign    Worried About Running Out of Food in the Last Year: Never true    Ran Out of Food in the Last Year: Never true  Transportation Needs: No Transportation Needs (07/27/2022)   PRAPARE - Administrator, Civil Service (Medical): No    Lack of Transportation (Non-Medical): No  Physical Activity: Not on file  Stress: Not on file  Social Connections: Not on file  Intimate Partner Violence: Not on file    FAMILY HISTORY: Family History  Problem Relation Age of Onset   Aneurysm Mother    Heart attack Father    Aneurysm Sister    Breast cancer Sister        diagnosed older than 42 years of age   Clotting disorder Brother     ALLERGIES:  is allergic to hydrochlorothiazide and pseudoephedrine.  MEDICATIONS:  Current Outpatient Medications  Medication Sig Dispense Refill   acetaminophen  (TYLENOL ) 325 MG tablet Take 2 tablets (650 mg total) by mouth every 6 (six) hours as needed for mild pain, moderate pain or fever (or Fever >/= 101). (Patient taking differently: Take 325-650 mg by mouth every 6 (six) hours as needed for mild pain (pain score 1-3), moderate pain (pain score 4-6) or fever (or Fever >/= 101).)     allopurinol  (ZYLOPRIM ) 100 MG tablet Take 100 mg by mouth daily.     amLODipine  (NORVASC ) 10 MG tablet Take 1 tablet (10 mg total) by mouth daily. 30 tablet 0   Calcium  Carb-Cholecalciferol  (OYSTER SHELL CALCIUM  W/D) 500-5 MG-MCG TABS Take 1 tablet by mouth 2 (two) times daily.     carvedilol  (COREG ) 25 MG tablet Take 1 tablet (25 mg total) by mouth 2 (two) times daily with a meal. 60 tablet 0   cetirizine (ZYRTEC) 10 MG tablet Take 10 mg by mouth 2 (two) times daily.     cholecalciferol  (VITAMIN D3) 25 MCG (1000 UNIT) tablet Take 1,000 Units by mouth daily.     ferrous sulfate  325 (65 FE) MG tablet Take 1 tablet (325 mg total) by mouth daily with breakfast. 30 tablet 0    folic acid  (FOLVITE ) 1 MG tablet Take 1 tablet (1 mg total) by mouth daily. 30 tablet 0   furosemide  (LASIX ) 20 MG tablet Take 1 tablet (20 mg total) by mouth daily. 30 tablet 0   levETIRAcetam  (KEPPRA  XR) 750 MG 24 hr tablet Take 2 tablets (1,500 mg total) by mouth at bedtime. 180 tablet 4   pantoprazole  (PROTONIX ) 40 MG tablet Take 1 tablet (40 mg total) by mouth daily. 30 tablet 0   rivaroxaban  (XARELTO ) 20 MG TABS tablet Take 1 tablet (20 mg total) by mouth daily with supper. 30 tablet 0   sodium bicarbonate  650 MG tablet Take 1,300 mg by mouth 2 (  two) times daily.     Current Facility-Administered Medications  Medication Dose Route Frequency Provider Last Rate Last Admin   incobotulinumtoxinA  (XEOMIN ) 100 units injection 300 Units  300 Units Intramuscular Q90 days Onita Duos, MD   300 Units at 08/30/23 1412    REVIEW OF SYSTEMS:   Constitutional: ( - ) fevers, ( - )  chills , ( - ) night sweats Eyes: ( - ) blurriness of vision, ( - ) double vision, ( - ) watery eyes Ears, nose, mouth, throat, and face: ( - ) mucositis, ( - ) sore throat Respiratory: ( - ) cough, ( - ) dyspnea, ( - ) wheezes Cardiovascular: ( - ) palpitation, ( - ) chest discomfort, ( - ) lower extremity swelling Gastrointestinal:  ( - ) nausea, ( - ) heartburn, ( - ) change in bowel habits Skin: ( - ) abnormal skin rashes Lymphatics: ( - ) new lymphadenopathy, ( - ) easy bruising Neurological: ( - ) numbness, ( - ) tingling, ( - ) new weaknesses Behavioral/Psych: ( - ) mood change, ( - ) new changes  All other systems were reviewed with the patient and are negative.  PHYSICAL EXAMINATION: ECOG PERFORMANCE STATUS: 1 - Symptomatic but completely ambulatory  There were no vitals filed for this visit.  There were no vitals filed for this visit.   GENERAL: well appearing male in NAD  SKIN: skin color, texture, turgor are normal, no rashes or significant lesions EYES: conjunctiva are pink and non-injected, sclera  clear LUNGS: clear to auscultation and percussion with normal breathing effort HEART: regular rate & rhythm and no murmurs and no lower extremity edema Musculoskeletal: no cyanosis of digits and no clubbing  PSYCH: alert & oriented x 3, fluent speech NEURO: no focal motor/sensory deficits  LABORATORY DATA:  I have reviewed the data as listed    Latest Ref Rng & Units 03/02/2024   10:22 AM 02/29/2024    2:21 PM 08/29/2023    2:11 PM  CBC  WBC 4.0 - 10.5 K/uL  4.2  5.4   Hemoglobin 13.0 - 17.0 g/dL 7.8  7.9  9.9   Hematocrit 39.0 - 52.0 % 23.0  23.6  29.6   Platelets 150 - 400 K/uL  130  166        Latest Ref Rng & Units 03/02/2024   10:22 AM 02/29/2024    2:21 PM 08/29/2023    2:11 PM  CMP  Glucose 70 - 99 mg/dL 90  886  895   BUN 8 - 23 mg/dL 79  83  70   Creatinine 0.61 - 1.24 mg/dL 4.19  4.82  6.37   Sodium 135 - 145 mmol/L 143  141  136   Potassium 3.5 - 5.1 mmol/L 4.7  4.6  4.9   Chloride 98 - 111 mmol/L 113  112  110   CO2 22 - 32 mmol/L  21  17   Calcium  8.9 - 10.3 mg/dL  7.8  9.0   Total Protein 6.5 - 8.1 g/dL  7.2  7.5   Total Bilirubin 0.0 - 1.2 mg/dL  0.3  0.3   Alkaline Phos 38 - 126 U/L  72  80   AST 15 - 41 U/L  14  12   ALT 0 - 44 U/L  9  9     ASSESSMENT & PLAN Mitchell Simpler Sr. Is a 73 y.o. male who returns to the clinic for MGUS.    #IgG Lambda MGUS: --  Underwent kidney biopsy on 09/29/2022. Path revealed severe chronic injury compatible with vascular etiology. No evidence of paraprotein-related renal disease.  --Bone met survey from 08/13/2022 was negative for focal lytic lesions. Repeat annual with next one due around November 2024.  --Labs today reviewed with patient and wife. WBC 4.2, hemoglobin 7.9, MCV 81.9, platelets 130. SPEP shows stable M protein at 0.3 with increased total serum free light chains with normal ratio. --RTC in 6 months with labs unless further intervention is required.   #CKD: #Anemia: --Under the care of Dr. Katheryn Saba in nephrology   --Consider epo injections if Hgb is consistently < 10.   No orders of the defined types were placed in this encounter.   All questions were answered. The patient knows to call the clinic with any problems, questions or concerns.  I have spent a total of 25 minutes minutes of face-to-face and non-face-to-face time, preparing to see the patient, performing a medically appropriate examination, counseling and educating the patient, ordering tests/procedures, documenting clinical information in the electronic health record, and care coordination.   Norleen IVAR Kidney, MD Department of Hematology/Oncology [facility] Phone: [phone] Pager: [phone] Email: [email or web address]

## 2024-03-07 NOTE — Telephone Encounter (Signed)
 Patient scheduled for 06/20 with Dr. Lavonne Prairie for cardiac clearance

## 2024-03-07 NOTE — Telephone Encounter (Signed)
 ADDENDUM: NEW PT APPT CHANGED TO 03/16/24 DR. HOCHREIN.

## 2024-03-08 ENCOUNTER — Ambulatory Visit: Admitting: Cardiology

## 2024-03-08 ENCOUNTER — Inpatient Hospital Stay (HOSPITAL_COMMUNITY): Admission: RE | Admit: 2024-03-08 | Source: Ambulatory Visit

## 2024-03-08 DIAGNOSIS — N186 End stage renal disease: Secondary | ICD-10-CM | POA: Diagnosis not present

## 2024-03-08 DIAGNOSIS — D472 Monoclonal gammopathy: Secondary | ICD-10-CM | POA: Diagnosis not present

## 2024-03-08 DIAGNOSIS — Z23 Encounter for immunization: Secondary | ICD-10-CM | POA: Diagnosis not present

## 2024-03-08 DIAGNOSIS — D509 Iron deficiency anemia, unspecified: Secondary | ICD-10-CM | POA: Diagnosis not present

## 2024-03-08 DIAGNOSIS — N2581 Secondary hyperparathyroidism of renal origin: Secondary | ICD-10-CM | POA: Diagnosis not present

## 2024-03-08 DIAGNOSIS — Z992 Dependence on renal dialysis: Secondary | ICD-10-CM | POA: Diagnosis not present

## 2024-03-09 ENCOUNTER — Telehealth: Payer: Self-pay

## 2024-03-09 NOTE — Telephone Encounter (Signed)
 Patient has appt scheduled for cardiac clearance.  6/20 w/ Dr. Lavonne Prairie

## 2024-03-10 DIAGNOSIS — D472 Monoclonal gammopathy: Secondary | ICD-10-CM | POA: Diagnosis not present

## 2024-03-10 DIAGNOSIS — Z992 Dependence on renal dialysis: Secondary | ICD-10-CM | POA: Diagnosis not present

## 2024-03-10 DIAGNOSIS — N2581 Secondary hyperparathyroidism of renal origin: Secondary | ICD-10-CM | POA: Diagnosis not present

## 2024-03-10 DIAGNOSIS — N186 End stage renal disease: Secondary | ICD-10-CM | POA: Diagnosis not present

## 2024-03-10 DIAGNOSIS — Z23 Encounter for immunization: Secondary | ICD-10-CM | POA: Diagnosis not present

## 2024-03-10 DIAGNOSIS — D509 Iron deficiency anemia, unspecified: Secondary | ICD-10-CM | POA: Diagnosis not present

## 2024-03-12 ENCOUNTER — Encounter (HOSPITAL_COMMUNITY): Payer: Self-pay

## 2024-03-12 ENCOUNTER — Encounter (HOSPITAL_COMMUNITY)

## 2024-03-13 DIAGNOSIS — N186 End stage renal disease: Secondary | ICD-10-CM | POA: Diagnosis not present

## 2024-03-13 DIAGNOSIS — D472 Monoclonal gammopathy: Secondary | ICD-10-CM | POA: Diagnosis not present

## 2024-03-13 DIAGNOSIS — Z23 Encounter for immunization: Secondary | ICD-10-CM | POA: Diagnosis not present

## 2024-03-13 DIAGNOSIS — N2581 Secondary hyperparathyroidism of renal origin: Secondary | ICD-10-CM | POA: Diagnosis not present

## 2024-03-13 DIAGNOSIS — D509 Iron deficiency anemia, unspecified: Secondary | ICD-10-CM | POA: Diagnosis not present

## 2024-03-13 DIAGNOSIS — Z992 Dependence on renal dialysis: Secondary | ICD-10-CM | POA: Diagnosis not present

## 2024-03-15 ENCOUNTER — Encounter: Payer: Self-pay | Admitting: Cardiology

## 2024-03-15 DIAGNOSIS — D509 Iron deficiency anemia, unspecified: Secondary | ICD-10-CM | POA: Diagnosis not present

## 2024-03-15 DIAGNOSIS — N186 End stage renal disease: Secondary | ICD-10-CM | POA: Diagnosis not present

## 2024-03-15 DIAGNOSIS — D472 Monoclonal gammopathy: Secondary | ICD-10-CM | POA: Diagnosis not present

## 2024-03-15 DIAGNOSIS — Z992 Dependence on renal dialysis: Secondary | ICD-10-CM | POA: Diagnosis not present

## 2024-03-15 DIAGNOSIS — Z23 Encounter for immunization: Secondary | ICD-10-CM | POA: Diagnosis not present

## 2024-03-15 DIAGNOSIS — N2581 Secondary hyperparathyroidism of renal origin: Secondary | ICD-10-CM | POA: Diagnosis not present

## 2024-03-15 NOTE — Progress Notes (Unsigned)
 Cardiology Office Note   Date:  03/16/2024   ID:  Shmuel Girgis Sr., DOB 1951-08-31, MRN [id number]  PCP:  Wilburn Handler, MD  Cardiologist:   None Referring:  [facility]  Chief Complaint  Patient presents with   Pre-op  Exam      History of Present Illness: Mitchell [name]. is a 73 y.o. male who presents for preop clearance.    He is need of dialysis graft placement.   He has a history of V fib arrest in 2016.  I reviewed these records.  He had a prolonged hospitalization including intubation with a tracheal tear.    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  He had anoxic brain injury.  He hd a DVT.  He did not get a cardiac cath that I can find.  His most recent echo that I see from 2019 demonstrated an EF of 30%.    He was left with some left hemiparesis following his stroke but he gets around pretty well.  He has a cane.  He does not ambulate very much but he can walk from his house to the car.  He does not have any chest pressure, neck or arm discomfort.  He has not had any shortness of breath, PND or orthopnea.  He has no palpitations, presyncope or syncope.  He tolerates dialysis.  He does not drink alcohol anymore.  He has not had any workup since his hospitalization that I can see other than a follow-up echo in 2019 that still confirmed the EF to be 30 to 35%.  There was some mild aortic insufficiency.   Past Medical History:  Diagnosis Date   Alcohol abuse    Anemia    Anoxic brain injury (HCC)    Cardiac arrest (HCC) 07/08/2015   Chronic kidney disease    CVA (cerebral vascular accident) (HCC)    DVT (deep venous thrombosis) (HCC)    12/04/12, 06/18/18   Dysphagia    Gout    Hyperlipemia    Hypertension    IgG monoclonal gammopathy of undetermined significance (MGUS)    Seizures (HCC)    Thrombocytopenia (HCC)     Past Surgical History:  Procedure Laterality Date   INSERTION OF DIALYSIS CATHETER Right 03/02/2024   Procedure: INSERTION OF DIALYSIS CATHETER;  Surgeon:  Philipp Brawn, MD;  Location: [facility] OR;  Service: Vascular;  Laterality: Right;   lymph nodes     biopsy - benign   RADIOLOGY WITH ANESTHESIA N/A 07/17/2015   Procedure: RADIOLOGY WITH ANESTHESIA;  Surgeon: Medication Radiologist, MD;  Location: [facility] OR;  Service: Radiology;  Laterality: N/A;   TRANSURETHRAL RESECTION OF PROSTATE N/A 09/26/2021   Procedure: TRANSURETHRAL RESECTION AND UNROOFING OF PROSTATIC ABSCESS;  Surgeon: Trent Frizzle, MD;  Location: [facility] ORS;  Service: Urology;  Laterality: N/A;     Current Outpatient Medications  Medication Sig Dispense Refill   acetaminophen  (TYLENOL ) 325 MG tablet Take 2 tablets (650 mg total) by mouth every 6 (six) hours as needed for mild pain, moderate pain or fever (or Fever >/= 101). (Patient taking differently: Take 325-650 mg by mouth every 6 (six) hours as needed for mild pain (pain score 1-3), moderate pain (pain score 4-6) or fever (or Fever >/= 101).)     allopurinol  (ZYLOPRIM ) 100 MG tablet Take 100 mg by mouth daily.     amLODipine  (NORVASC ) 10 MG tablet Take 1 tablet (10 mg total) by mouth daily. 30 tablet 0  Calcium  Carb-Cholecalciferol  (OYSTER SHELL CALCIUM  W/D) 500-5 MG-MCG TABS Take 1 tablet by mouth 2 (two) times daily.     carvedilol  (COREG ) 25 MG tablet Take 1 tablet (25 mg total) by mouth 2 (two) times daily with a meal. 60 tablet 0   cetirizine (ZYRTEC) 10 MG tablet Take 10 mg by mouth 2 (two) times daily.     cholecalciferol  (VITAMIN D3) 25 MCG (1000 UNIT) tablet Take 1,000 Units by mouth daily.     ferrous sulfate  325 (65 FE) MG tablet Take 1 tablet (325 mg total) by mouth daily with breakfast. 30 tablet 0   folic acid  (FOLVITE ) 1 MG tablet Take 1 tablet (1 mg total) by mouth daily. 30 tablet 0   furosemide  (LASIX ) 20 MG tablet Take 1 tablet (20 mg total) by mouth daily. 30 tablet 0   levETIRAcetam  (KEPPRA  XR) 750 MG 24 hr tablet Take 2 tablets (1,500 mg total) by mouth at bedtime. 180 tablet 4   pantoprazole  (PROTONIX ) 40  MG tablet Take 1 tablet (40 mg total) by mouth daily. 30 tablet 0   rivaroxaban  (XARELTO ) 20 MG TABS tablet Take 1 tablet (20 mg total) by mouth daily with supper. 30 tablet 0   sodium bicarbonate  650 MG tablet Take 1,300 mg by mouth 2 (two) times daily.     Current Facility-Administered Medications  Medication Dose Route Frequency Provider Last Rate Last Admin   incobotulinumtoxinA  (XEOMIN ) 100 units injection 300 Units  300 Units Intramuscular Q90 days Phebe Brasil, MD   300 Units at 08/30/23 1412    Allergies:   Hydrochlorothiazide and Pseudoephedrine    Social History:  The patient  reports that he has never smoked. He has never used smokeless tobacco. He reports that he does not currently use drugs after having used the following drugs: Marijuana. He reports that he does not drink alcohol.   Family History:  The patient's family history includes Aneurysm in his mother and sister; Breast cancer in his sister; Clotting disorder in his brother; Heart attack in his father.    ROS:  Please see the history of present illness.   Otherwise, review of systems are positive for none.   All other systems are reviewed and negative.    PHYSICAL EXAM: VS:  BP 124/75   Pulse 71   Ht 5' 8 (1.727 m)   Wt 168 lb (76.2 kg)   SpO2 96%   BMI 25.54 kg/m  , BMI Body mass index is 25.54 kg/m. GENERAL:  Well appearing HEENT:  Pupils equal round and reactive, fundi not visualized, oral mucosa unremarkable NECK:  No jugular venous distention, waveform within normal limits, carotid upstroke brisk and symmetric, no bruits, no thyromegaly LYMPHATICS:  No cervical, inguinal adenopathy LUNGS:  Clear to auscultation bilaterally BACK:  No CVA tenderness CHEST:  Unremarkable HEART:  PMI not displaced or sustained,S1 and S2 within normal limits, no S3, no S4, no clicks, no rubs, no murmurs ABD:  Flat, positive bowel sounds normal in frequency in pitch, no bruits, no rebound, no guarding, no midline pulsatile  mass, no hepatomegaly, no splenomegaly EXT:  2 plus pulses throughout, no edema, no cyanosis no clubbing SKIN:  No rashes no nodules NEURO:  Cranial nerves II through XII grossly intact, motor grossly intact throughout PSYCH:  Cognitively intact, oriented to person place and time  EKG:    Sinus rhythm, rate 73, axis within normal limits, intervals within normal limits, left ventricular hypertrophy by voltage criteria, QTc mildly prolonged.  03/02/2024  Recent Labs: 02/29/2024: ALT 9; Platelet Count 130 03/02/2024: BUN 79; Creatinine, Ser 5.80; Hemoglobin 7.8; Potassium 4.7; Sodium 143    Lipid Panel    Component Value Date/Time   CHOL 165 06/08/2018 0002   TRIG 87 06/08/2018 0002   HDL 86 06/08/2018 0002   CHOLHDL 1.9 06/08/2018 0002   VLDL 17 06/08/2018 0002   LDLCALC 62 06/08/2018 0002      Wt Readings from Last 3 Encounters:  03/16/24 168 lb (76.2 kg)  03/07/24 167 lb 1.6 oz (75.8 kg)  03/02/24 169 lb (76.7 kg)      Other studies Reviewed: Additional studies/ records that were reviewed today include: Previous labs and hospitalization. Review of the above records demonstrates:  Please see elsewhere in the note.     ASSESSMENT AND PLAN:   Preop:   The patient does have a past history of V-fib arrest unexplained and cardiomyopathy.  I am going to repeat an echocardiogram and do stress perfusion imaging prior to vascular surgery given his history and his limited functional capacity.  Cardiomyopathy: Volume is managed for dialysis.  He has class I symptoms.  Of note if his ejection fraction is reduced I would likely want to titrate his meds and perhaps discontinue the amlodipine  and start Entresto.  We will see him back for med titration but this can be following his surgery.  We should follow him chronically for his reduced ejection fraction.  Current medicines are reviewed at length with the patient today.  The patient does not have concerns regarding medicines.  The  following changes have been made:  no change  Labs/ tests ordered today include:   Orders Placed This Encounter  Procedures   NM PET CT CARDIAC PERFUSION MULTI W/ABSOLUTE BLOODFLOW   ECHOCARDIOGRAM COMPLETE     Disposition:   FU with with me or APP in two months.     Signed, Eilleen Grates, MD  03/16/2024 2:59 PM    [facility]

## 2024-03-16 ENCOUNTER — Ambulatory Visit: Attending: Cardiology | Admitting: Cardiology

## 2024-03-16 ENCOUNTER — Other Ambulatory Visit: Payer: Self-pay

## 2024-03-16 ENCOUNTER — Encounter: Payer: Self-pay | Admitting: Cardiology

## 2024-03-16 VITALS — BP 124/75 | HR 71 | Ht 68.0 in | Wt 168.0 lb

## 2024-03-16 DIAGNOSIS — I429 Cardiomyopathy, unspecified: Secondary | ICD-10-CM | POA: Diagnosis not present

## 2024-03-16 DIAGNOSIS — I5042 Chronic combined systolic (congestive) and diastolic (congestive) heart failure: Secondary | ICD-10-CM | POA: Diagnosis not present

## 2024-03-16 NOTE — Progress Notes (Unsigned)
 Attestation pended for PET/CT Stress Test

## 2024-03-16 NOTE — Patient Instructions (Addendum)
 Medication Instructions:  Your physician recommends that you continue on your current medications as directed. Please refer to the Current Medication list given to you today.  *If you need a refill on your cardiac medications before your next appointment, please call your pharmacy*  Lab Work: NONE If you have labs (blood work) drawn today and your tests are completely normal, you will receive your results only by: MyChart Message (if you have MyChart) OR A paper copy in the mail If you have any lab test that is abnormal or we need to change your treatment, we will call you to review the results.  Testing/Procedures: Echo Your physician has requested that you have an echocardiogram. Echocardiography is a painless test that uses sound waves to create images of your heart. It provides your doctor with information about the size and shape of your heart and how well your heart's chambers and valves are working. This procedure takes approximately one hour. There are no restrictions for this procedure. Please do NOT wear cologne, perfume, aftershave, or lotions (deodorant is allowed). Please arrive 15 minutes prior to your appointment time.  Please note: We ask at that you not bring children with you during ultrasound (echo/ vascular) testing. Due to room size and safety concerns, children are not allowed in the ultrasound rooms during exams. Our front office staff cannot provide observation of children in our lobby area while testing is being conducted. An adult accompanying a patient to their appointment will only be allowed in the ultrasound room at the discretion of the ultrasound technician under special circumstances. We apologize for any inconvenience.  PET/CT Stress Test  Follow-Up: At [facility], you and your health needs are our priority.  As part of our continuing mission to provide you with exceptional heart care, our providers are all part of one team.  This team includes your  primary Cardiologist (physician) and Advanced Practice Providers or APPs (Physician Assistants and Nurse Practitioners) who all work together to provide you with the care you need, when you need it.  Your next appointment:   2 months  Provider:   APP  We recommend signing up for the patient portal called MyChart.  Sign up information is provided on this After Visit Summary.  MyChart is used to connect with patients for Virtual Visits (Telemedicine).  Patients are able to view lab/test results, encounter notes, upcoming appointments, etc.  Non-urgent messages can be sent to your provider as well.   To learn more about what you can do with MyChart, go to [email or web address].   Other Instructions    Please report to Radiology at the [facility] Main Entrance 30 minutes early for your test.  [address] [place], [place] [address]  How to Prepare for Your Cardiac PET/CT Stress Test:  Nothing to eat or drink, except water , 3 hours prior to arrival time.  NO caffeine/decaffeinated products, or chocolate 12 hours prior to arrival. (Please note decaffeinated beverages (teas/coffees) still contain caffeine).  If you have caffeine within 12 hours prior, the test will need to be rescheduled.  Medication instructions: Do not take erectile dysfunction medications for 72 hours prior to test (sildenafil, tadalafil) Do not take nitrates (isosorbide mononitrate, Ranexa) the day before or day of test Do not take tamsulosin the day before or morning of test Hold theophylline containing medications for 12 hours. Hold Dipyridamole 48 hours prior to the test.  Diabetic Preparation: If able to eat breakfast prior to 3 hour fasting, you  may take all medications, including your insulin. Do not worry if you miss your breakfast dose of insulin - start at your next meal. If you do not eat prior to 3 hour fast-Hold all diabetes (oral and insulin) medications. Patients who wear a continuous  glucose monitor MUST remove the device prior to scanning.  You may take your remaining medications with water .  NO perfume, cologne or lotion on chest or abdomen area. FEMALES - Please avoid wearing dresses to this appointment.  Total time is 1 to 2 hours; you may want to bring reading material for the waiting time.  IF YOU THINK YOU MAY BE PREGNANT, OR ARE NURSING PLEASE INFORM THE TECHNOLOGIST.  In preparation for your appointment, medication and supplies will be purchased.  Appointment availability is limited, so if you need to cancel or reschedule, please call the Radiology Department Scheduler at [phone] 24 hours in advance to avoid a cancellation fee of $100.00  What to Expect When you Arrive:  Once you arrive and check in for your appointment, you will be taken to a preparation room within the Radiology Department.  A technologist or Nurse will obtain your medical history, verify that you are correctly prepped for the exam, and explain the procedure.  Afterwards, an IV will be started in your arm and electrodes will be placed on your skin for EKG monitoring during the stress portion of the exam. Then you will be escorted to the PET/CT scanner.  There, staff will get you positioned on the scanner and obtain a blood pressure and EKG.  During the exam, you will continue to be connected to the EKG and blood pressure machines.  A small, safe amount of a radioactive tracer will be injected in your IV to obtain a series of pictures of your heart along with an injection of a stress agent.    After your Exam:  It is recommended that you eat a meal and drink a caffeinated beverage to counter act any effects of the stress agent.  Drink plenty of fluids for the remainder of the day and urinate frequently for the first couple of hours after the exam.  Your doctor will inform you of your test results within 7-10 business days.  For more information and frequently asked questions, please visit our  website: [email or web address]  For questions about your test or how to prepare for your test, please call: Cardiac Imaging Nurse Navigators Office: [phone]

## 2024-03-17 DIAGNOSIS — Z992 Dependence on renal dialysis: Secondary | ICD-10-CM | POA: Diagnosis not present

## 2024-03-17 DIAGNOSIS — N2581 Secondary hyperparathyroidism of renal origin: Secondary | ICD-10-CM | POA: Diagnosis not present

## 2024-03-17 DIAGNOSIS — D472 Monoclonal gammopathy: Secondary | ICD-10-CM | POA: Diagnosis not present

## 2024-03-17 DIAGNOSIS — D509 Iron deficiency anemia, unspecified: Secondary | ICD-10-CM | POA: Diagnosis not present

## 2024-03-17 DIAGNOSIS — N186 End stage renal disease: Secondary | ICD-10-CM | POA: Diagnosis not present

## 2024-03-17 DIAGNOSIS — Z23 Encounter for immunization: Secondary | ICD-10-CM | POA: Diagnosis not present

## 2024-03-20 ENCOUNTER — Encounter (HOSPITAL_COMMUNITY): Payer: Self-pay

## 2024-03-20 ENCOUNTER — Telehealth (HOSPITAL_COMMUNITY): Payer: Self-pay | Admitting: *Deleted

## 2024-03-20 ENCOUNTER — Telehealth: Payer: Self-pay | Admitting: Neurology

## 2024-03-20 DIAGNOSIS — N186 End stage renal disease: Secondary | ICD-10-CM | POA: Diagnosis not present

## 2024-03-20 DIAGNOSIS — N2581 Secondary hyperparathyroidism of renal origin: Secondary | ICD-10-CM | POA: Diagnosis not present

## 2024-03-20 DIAGNOSIS — Z23 Encounter for immunization: Secondary | ICD-10-CM | POA: Diagnosis not present

## 2024-03-20 DIAGNOSIS — Z992 Dependence on renal dialysis: Secondary | ICD-10-CM | POA: Diagnosis not present

## 2024-03-20 DIAGNOSIS — D509 Iron deficiency anemia, unspecified: Secondary | ICD-10-CM | POA: Diagnosis not present

## 2024-03-20 DIAGNOSIS — D472 Monoclonal gammopathy: Secondary | ICD-10-CM | POA: Diagnosis not present

## 2024-03-20 NOTE — Telephone Encounter (Signed)
 Wife has called to r/s pt's appointment due to a conflict

## 2024-03-20 NOTE — Telephone Encounter (Signed)
 Reaching out to patient to offer assistance regarding upcoming cardiac imaging study; pt's wife answered phone and verbalizes understanding of appt date/time, parking situation and where to check in, pre-test NPO status; name and call back number provided for further questions should they arise  Chantal Requena RN Navigator Cardiac Imaging [facility] [phone] office [phone] cell  Patient's wife is aware that he is to avoid caffeine 12 hours prior to his cardiac PET scan.

## 2024-03-21 ENCOUNTER — Encounter (HOSPITAL_COMMUNITY)
Admission: RE | Admit: 2024-03-21 | Discharge: 2024-03-21 | Disposition: A | Discharge status: Home / Self Care | Source: Ambulatory Visit | Attending: Cardiology | Admitting: Cardiology

## 2024-03-21 DIAGNOSIS — I429 Cardiomyopathy, unspecified: Secondary | ICD-10-CM | POA: Insufficient documentation

## 2024-03-21 LAB — NM PET CT CARDIAC PERFUSION MULTI W/ABSOLUTE BLOODFLOW
MBFR: 1.65
Nuc Rest EF: 20 %
Nuc Stress EF: 28 %
Rest MBF: 0.97 ml/g/min
Rest Nuclear Isotope Dose: 19.7 mCi
ST Depression (mm): 0 mm
Stress MBF: 1.6 ml/g/min
Stress Nuclear Isotope Dose: 19.7 mCi
TID: 1

## 2024-03-21 MED ORDER — REGADENOSON 0.4 MG/5ML IV SOLN
0.4000 mg | Freq: Once | INTRAVENOUS | Status: AC
  Administered 2024-03-21: 0.4 mg via INTRAVENOUS

## 2024-03-21 MED ORDER — RUBIDIUM RB82 GENERATOR (RUBYFILL)
19.7300 | PACK | Freq: Once | INTRAVENOUS | Status: AC
  Administered 2024-03-21: 19.73 via INTRAVENOUS

## 2024-03-21 MED ORDER — REGADENOSON 0.4 MG/5ML IV SOLN
INTRAVENOUS | Status: AC
Start: 2024-03-21 — End: 2024-03-21
  Filled 2024-03-21: qty 5

## 2024-03-21 MED ORDER — RUBIDIUM RB82 GENERATOR (RUBYFILL)
19.7500 | PACK | Freq: Once | INTRAVENOUS | Status: AC
  Administered 2024-03-21: 19.75 via INTRAVENOUS

## 2024-03-22 ENCOUNTER — Encounter (HOSPITAL_COMMUNITY)

## 2024-03-22 DIAGNOSIS — N186 End stage renal disease: Secondary | ICD-10-CM | POA: Diagnosis not present

## 2024-03-22 DIAGNOSIS — D509 Iron deficiency anemia, unspecified: Secondary | ICD-10-CM | POA: Diagnosis not present

## 2024-03-22 DIAGNOSIS — N2581 Secondary hyperparathyroidism of renal origin: Secondary | ICD-10-CM | POA: Diagnosis not present

## 2024-03-22 DIAGNOSIS — D472 Monoclonal gammopathy: Secondary | ICD-10-CM | POA: Diagnosis not present

## 2024-03-22 DIAGNOSIS — Z992 Dependence on renal dialysis: Secondary | ICD-10-CM | POA: Diagnosis not present

## 2024-03-22 DIAGNOSIS — Z23 Encounter for immunization: Secondary | ICD-10-CM | POA: Diagnosis not present

## 2024-03-24 DIAGNOSIS — N2581 Secondary hyperparathyroidism of renal origin: Secondary | ICD-10-CM | POA: Diagnosis not present

## 2024-03-24 DIAGNOSIS — Z23 Encounter for immunization: Secondary | ICD-10-CM | POA: Diagnosis not present

## 2024-03-24 DIAGNOSIS — D472 Monoclonal gammopathy: Secondary | ICD-10-CM | POA: Diagnosis not present

## 2024-03-24 DIAGNOSIS — Z992 Dependence on renal dialysis: Secondary | ICD-10-CM | POA: Diagnosis not present

## 2024-03-24 DIAGNOSIS — N186 End stage renal disease: Secondary | ICD-10-CM | POA: Diagnosis not present

## 2024-03-24 DIAGNOSIS — D509 Iron deficiency anemia, unspecified: Secondary | ICD-10-CM | POA: Diagnosis not present

## 2024-03-26 ENCOUNTER — Encounter (HOSPITAL_COMMUNITY)

## 2024-03-26 ENCOUNTER — Ambulatory Visit: Payer: Self-pay | Admitting: Cardiology

## 2024-03-27 ENCOUNTER — Ambulatory Visit: Payer: Medicare Other | Admitting: Neurology

## 2024-03-27 DIAGNOSIS — Z992 Dependence on renal dialysis: Secondary | ICD-10-CM | POA: Diagnosis not present

## 2024-03-27 DIAGNOSIS — D472 Monoclonal gammopathy: Secondary | ICD-10-CM | POA: Diagnosis not present

## 2024-03-27 DIAGNOSIS — N186 End stage renal disease: Secondary | ICD-10-CM | POA: Diagnosis not present

## 2024-03-27 DIAGNOSIS — Z23 Encounter for immunization: Secondary | ICD-10-CM | POA: Diagnosis not present

## 2024-03-27 DIAGNOSIS — D509 Iron deficiency anemia, unspecified: Secondary | ICD-10-CM | POA: Diagnosis not present

## 2024-03-27 DIAGNOSIS — I120 Hypertensive chronic kidney disease with stage 5 chronic kidney disease or end stage renal disease: Secondary | ICD-10-CM | POA: Diagnosis not present

## 2024-03-27 DIAGNOSIS — N2581 Secondary hyperparathyroidism of renal origin: Secondary | ICD-10-CM | POA: Diagnosis not present

## 2024-03-27 DIAGNOSIS — D631 Anemia in chronic kidney disease: Secondary | ICD-10-CM | POA: Diagnosis not present

## 2024-03-28 DIAGNOSIS — Z79899 Other long term (current) drug therapy: Secondary | ICD-10-CM | POA: Diagnosis not present

## 2024-03-28 DIAGNOSIS — M25569 Pain in unspecified knee: Secondary | ICD-10-CM | POA: Diagnosis not present

## 2024-03-28 DIAGNOSIS — M1A09X1 Idiopathic chronic gout, multiple sites, with tophus (tophi): Secondary | ICD-10-CM | POA: Diagnosis not present

## 2024-03-28 DIAGNOSIS — Z992 Dependence on renal dialysis: Secondary | ICD-10-CM | POA: Diagnosis not present

## 2024-03-28 DIAGNOSIS — Z8673 Personal history of transient ischemic attack (TIA), and cerebral infarction without residual deficits: Secondary | ICD-10-CM | POA: Diagnosis not present

## 2024-03-28 DIAGNOSIS — M1180 Other specified crystal arthropathies, unspecified site: Secondary | ICD-10-CM | POA: Diagnosis not present

## 2024-03-28 DIAGNOSIS — N186 End stage renal disease: Secondary | ICD-10-CM | POA: Diagnosis not present

## 2024-03-28 DIAGNOSIS — M150 Primary generalized (osteo)arthritis: Secondary | ICD-10-CM | POA: Diagnosis not present

## 2024-03-28 DIAGNOSIS — N184 Chronic kidney disease, stage 4 (severe): Secondary | ICD-10-CM | POA: Diagnosis not present

## 2024-03-29 DIAGNOSIS — Z23 Encounter for immunization: Secondary | ICD-10-CM | POA: Diagnosis not present

## 2024-03-29 DIAGNOSIS — Z992 Dependence on renal dialysis: Secondary | ICD-10-CM | POA: Diagnosis not present

## 2024-03-29 DIAGNOSIS — N2581 Secondary hyperparathyroidism of renal origin: Secondary | ICD-10-CM | POA: Diagnosis not present

## 2024-03-29 DIAGNOSIS — D472 Monoclonal gammopathy: Secondary | ICD-10-CM | POA: Diagnosis not present

## 2024-03-29 DIAGNOSIS — N186 End stage renal disease: Secondary | ICD-10-CM | POA: Diagnosis not present

## 2024-03-29 DIAGNOSIS — D509 Iron deficiency anemia, unspecified: Secondary | ICD-10-CM | POA: Diagnosis not present

## 2024-03-31 DIAGNOSIS — Z992 Dependence on renal dialysis: Secondary | ICD-10-CM | POA: Diagnosis not present

## 2024-03-31 DIAGNOSIS — D472 Monoclonal gammopathy: Secondary | ICD-10-CM | POA: Diagnosis not present

## 2024-03-31 DIAGNOSIS — Z23 Encounter for immunization: Secondary | ICD-10-CM | POA: Diagnosis not present

## 2024-03-31 DIAGNOSIS — N186 End stage renal disease: Secondary | ICD-10-CM | POA: Diagnosis not present

## 2024-03-31 DIAGNOSIS — N2581 Secondary hyperparathyroidism of renal origin: Secondary | ICD-10-CM | POA: Diagnosis not present

## 2024-03-31 DIAGNOSIS — D509 Iron deficiency anemia, unspecified: Secondary | ICD-10-CM | POA: Diagnosis not present

## 2024-04-03 DIAGNOSIS — N186 End stage renal disease: Secondary | ICD-10-CM | POA: Diagnosis not present

## 2024-04-03 DIAGNOSIS — D472 Monoclonal gammopathy: Secondary | ICD-10-CM | POA: Diagnosis not present

## 2024-04-03 DIAGNOSIS — Z992 Dependence on renal dialysis: Secondary | ICD-10-CM | POA: Diagnosis not present

## 2024-04-03 DIAGNOSIS — D509 Iron deficiency anemia, unspecified: Secondary | ICD-10-CM | POA: Diagnosis not present

## 2024-04-03 DIAGNOSIS — N2581 Secondary hyperparathyroidism of renal origin: Secondary | ICD-10-CM | POA: Diagnosis not present

## 2024-04-03 DIAGNOSIS — Z23 Encounter for immunization: Secondary | ICD-10-CM | POA: Diagnosis not present

## 2024-04-04 DIAGNOSIS — L2989 Other pruritus: Secondary | ICD-10-CM | POA: Diagnosis not present

## 2024-04-05 DIAGNOSIS — Z23 Encounter for immunization: Secondary | ICD-10-CM | POA: Diagnosis not present

## 2024-04-05 DIAGNOSIS — N2581 Secondary hyperparathyroidism of renal origin: Secondary | ICD-10-CM | POA: Diagnosis not present

## 2024-04-05 DIAGNOSIS — Z992 Dependence on renal dialysis: Secondary | ICD-10-CM | POA: Diagnosis not present

## 2024-04-05 DIAGNOSIS — D472 Monoclonal gammopathy: Secondary | ICD-10-CM | POA: Diagnosis not present

## 2024-04-05 DIAGNOSIS — D509 Iron deficiency anemia, unspecified: Secondary | ICD-10-CM | POA: Diagnosis not present

## 2024-04-05 DIAGNOSIS — N186 End stage renal disease: Secondary | ICD-10-CM | POA: Diagnosis not present

## 2024-04-07 DIAGNOSIS — Z23 Encounter for immunization: Secondary | ICD-10-CM | POA: Diagnosis not present

## 2024-04-07 DIAGNOSIS — D509 Iron deficiency anemia, unspecified: Secondary | ICD-10-CM | POA: Diagnosis not present

## 2024-04-07 DIAGNOSIS — D472 Monoclonal gammopathy: Secondary | ICD-10-CM | POA: Diagnosis not present

## 2024-04-07 DIAGNOSIS — N186 End stage renal disease: Secondary | ICD-10-CM | POA: Diagnosis not present

## 2024-04-07 DIAGNOSIS — Z992 Dependence on renal dialysis: Secondary | ICD-10-CM | POA: Diagnosis not present

## 2024-04-07 DIAGNOSIS — N2581 Secondary hyperparathyroidism of renal origin: Secondary | ICD-10-CM | POA: Diagnosis not present

## 2024-04-10 DIAGNOSIS — Z992 Dependence on renal dialysis: Secondary | ICD-10-CM | POA: Diagnosis not present

## 2024-04-10 DIAGNOSIS — D509 Iron deficiency anemia, unspecified: Secondary | ICD-10-CM | POA: Diagnosis not present

## 2024-04-10 DIAGNOSIS — N186 End stage renal disease: Secondary | ICD-10-CM | POA: Diagnosis not present

## 2024-04-10 DIAGNOSIS — N2581 Secondary hyperparathyroidism of renal origin: Secondary | ICD-10-CM | POA: Diagnosis not present

## 2024-04-10 DIAGNOSIS — D472 Monoclonal gammopathy: Secondary | ICD-10-CM | POA: Diagnosis not present

## 2024-04-10 DIAGNOSIS — Z23 Encounter for immunization: Secondary | ICD-10-CM | POA: Diagnosis not present

## 2024-04-12 DIAGNOSIS — N186 End stage renal disease: Secondary | ICD-10-CM | POA: Diagnosis not present

## 2024-04-12 DIAGNOSIS — Z23 Encounter for immunization: Secondary | ICD-10-CM | POA: Diagnosis not present

## 2024-04-12 DIAGNOSIS — N2581 Secondary hyperparathyroidism of renal origin: Secondary | ICD-10-CM | POA: Diagnosis not present

## 2024-04-12 DIAGNOSIS — D509 Iron deficiency anemia, unspecified: Secondary | ICD-10-CM | POA: Diagnosis not present

## 2024-04-12 DIAGNOSIS — Z992 Dependence on renal dialysis: Secondary | ICD-10-CM | POA: Diagnosis not present

## 2024-04-12 DIAGNOSIS — D472 Monoclonal gammopathy: Secondary | ICD-10-CM | POA: Diagnosis not present

## 2024-04-14 DIAGNOSIS — D509 Iron deficiency anemia, unspecified: Secondary | ICD-10-CM | POA: Diagnosis not present

## 2024-04-14 DIAGNOSIS — N2581 Secondary hyperparathyroidism of renal origin: Secondary | ICD-10-CM | POA: Diagnosis not present

## 2024-04-14 DIAGNOSIS — N186 End stage renal disease: Secondary | ICD-10-CM | POA: Diagnosis not present

## 2024-04-14 DIAGNOSIS — Z992 Dependence on renal dialysis: Secondary | ICD-10-CM | POA: Diagnosis not present

## 2024-04-14 DIAGNOSIS — D472 Monoclonal gammopathy: Secondary | ICD-10-CM | POA: Diagnosis not present

## 2024-04-14 DIAGNOSIS — Z23 Encounter for immunization: Secondary | ICD-10-CM | POA: Diagnosis not present

## 2024-04-17 DIAGNOSIS — Z992 Dependence on renal dialysis: Secondary | ICD-10-CM | POA: Diagnosis not present

## 2024-04-17 DIAGNOSIS — D472 Monoclonal gammopathy: Secondary | ICD-10-CM | POA: Diagnosis not present

## 2024-04-17 DIAGNOSIS — Z23 Encounter for immunization: Secondary | ICD-10-CM | POA: Diagnosis not present

## 2024-04-17 DIAGNOSIS — D509 Iron deficiency anemia, unspecified: Secondary | ICD-10-CM | POA: Diagnosis not present

## 2024-04-17 DIAGNOSIS — N2581 Secondary hyperparathyroidism of renal origin: Secondary | ICD-10-CM | POA: Diagnosis not present

## 2024-04-17 DIAGNOSIS — N186 End stage renal disease: Secondary | ICD-10-CM | POA: Diagnosis not present

## 2024-04-19 DIAGNOSIS — N2581 Secondary hyperparathyroidism of renal origin: Secondary | ICD-10-CM | POA: Diagnosis not present

## 2024-04-19 DIAGNOSIS — Z992 Dependence on renal dialysis: Secondary | ICD-10-CM | POA: Diagnosis not present

## 2024-04-19 DIAGNOSIS — D509 Iron deficiency anemia, unspecified: Secondary | ICD-10-CM | POA: Diagnosis not present

## 2024-04-19 DIAGNOSIS — D472 Monoclonal gammopathy: Secondary | ICD-10-CM | POA: Diagnosis not present

## 2024-04-19 DIAGNOSIS — N186 End stage renal disease: Secondary | ICD-10-CM | POA: Diagnosis not present

## 2024-04-19 DIAGNOSIS — Z23 Encounter for immunization: Secondary | ICD-10-CM | POA: Diagnosis not present

## 2024-04-20 ENCOUNTER — Emergency Department (HOSPITAL_COMMUNITY)

## 2024-04-20 ENCOUNTER — Encounter (HOSPITAL_COMMUNITY): Payer: Self-pay | Admitting: Emergency Medicine

## 2024-04-20 ENCOUNTER — Other Ambulatory Visit: Payer: Self-pay

## 2024-04-20 ENCOUNTER — Inpatient Hospital Stay (HOSPITAL_COMMUNITY)

## 2024-04-20 ENCOUNTER — Inpatient Hospital Stay (HOSPITAL_COMMUNITY)
Admission: EM | Admit: 2024-04-20 | Discharge: 2024-05-28 | DRG: 091 | Disposition: E | Attending: Family Medicine | Admitting: Family Medicine

## 2024-04-20 DIAGNOSIS — G319 Degenerative disease of nervous system, unspecified: Secondary | ICD-10-CM | POA: Diagnosis not present

## 2024-04-20 DIAGNOSIS — Z7901 Long term (current) use of anticoagulants: Secondary | ICD-10-CM | POA: Diagnosis not present

## 2024-04-20 DIAGNOSIS — I69398 Other sequelae of cerebral infarction: Secondary | ICD-10-CM

## 2024-04-20 DIAGNOSIS — Z452 Encounter for adjustment and management of vascular access device: Secondary | ICD-10-CM | POA: Diagnosis not present

## 2024-04-20 DIAGNOSIS — R578 Other shock: Secondary | ICD-10-CM | POA: Diagnosis present

## 2024-04-20 DIAGNOSIS — Z9911 Dependence on respirator [ventilator] status: Secondary | ICD-10-CM | POA: Diagnosis not present

## 2024-04-20 DIAGNOSIS — I5042 Chronic combined systolic (congestive) and diastolic (congestive) heart failure: Secondary | ICD-10-CM | POA: Diagnosis present

## 2024-04-20 DIAGNOSIS — J9600 Acute respiratory failure, unspecified whether with hypoxia or hypercapnia: Secondary | ICD-10-CM | POA: Diagnosis not present

## 2024-04-20 DIAGNOSIS — Z6824 Body mass index (BMI) 24.0-24.9, adult: Secondary | ICD-10-CM

## 2024-04-20 DIAGNOSIS — D472 Monoclonal gammopathy: Secondary | ICD-10-CM | POA: Diagnosis present

## 2024-04-20 DIAGNOSIS — R404 Transient alteration of awareness: Secondary | ICD-10-CM | POA: Diagnosis not present

## 2024-04-20 DIAGNOSIS — G931 Anoxic brain damage, not elsewhere classified: Principal | ICD-10-CM | POA: Diagnosis present

## 2024-04-20 DIAGNOSIS — R9089 Other abnormal findings on diagnostic imaging of central nervous system: Secondary | ICD-10-CM | POA: Diagnosis not present

## 2024-04-20 DIAGNOSIS — G9341 Metabolic encephalopathy: Secondary | ICD-10-CM | POA: Diagnosis not present

## 2024-04-20 DIAGNOSIS — D631 Anemia in chronic kidney disease: Secondary | ICD-10-CM | POA: Diagnosis not present

## 2024-04-20 DIAGNOSIS — G40909 Epilepsy, unspecified, not intractable, without status epilepticus: Secondary | ICD-10-CM | POA: Diagnosis present

## 2024-04-20 DIAGNOSIS — M2450 Contracture, unspecified joint: Secondary | ICD-10-CM | POA: Diagnosis not present

## 2024-04-20 DIAGNOSIS — Z992 Dependence on renal dialysis: Secondary | ICD-10-CM

## 2024-04-20 DIAGNOSIS — Z79899 Other long term (current) drug therapy: Secondary | ICD-10-CM

## 2024-04-20 DIAGNOSIS — R4182 Altered mental status, unspecified: Secondary | ICD-10-CM | POA: Diagnosis not present

## 2024-04-20 DIAGNOSIS — I499 Cardiac arrhythmia, unspecified: Secondary | ICD-10-CM | POA: Diagnosis not present

## 2024-04-20 DIAGNOSIS — G253 Myoclonus: Secondary | ICD-10-CM | POA: Diagnosis present

## 2024-04-20 DIAGNOSIS — M109 Gout, unspecified: Secondary | ICD-10-CM | POA: Diagnosis present

## 2024-04-20 DIAGNOSIS — E43 Unspecified severe protein-calorie malnutrition: Secondary | ICD-10-CM | POA: Diagnosis present

## 2024-04-20 DIAGNOSIS — N186 End stage renal disease: Secondary | ICD-10-CM | POA: Diagnosis present

## 2024-04-20 DIAGNOSIS — I491 Atrial premature depolarization: Secondary | ICD-10-CM | POA: Diagnosis not present

## 2024-04-20 DIAGNOSIS — Z515 Encounter for palliative care: Secondary | ICD-10-CM | POA: Diagnosis not present

## 2024-04-20 DIAGNOSIS — E876 Hypokalemia: Secondary | ICD-10-CM | POA: Diagnosis not present

## 2024-04-20 DIAGNOSIS — Z832 Family history of diseases of the blood and blood-forming organs and certain disorders involving the immune mechanism: Secondary | ICD-10-CM

## 2024-04-20 DIAGNOSIS — Z4682 Encounter for fitting and adjustment of non-vascular catheter: Secondary | ICD-10-CM | POA: Diagnosis not present

## 2024-04-20 DIAGNOSIS — Z66 Do not resuscitate: Secondary | ICD-10-CM | POA: Diagnosis not present

## 2024-04-20 DIAGNOSIS — E785 Hyperlipidemia, unspecified: Secondary | ICD-10-CM | POA: Diagnosis present

## 2024-04-20 DIAGNOSIS — R739 Hyperglycemia, unspecified: Secondary | ICD-10-CM | POA: Diagnosis not present

## 2024-04-20 DIAGNOSIS — I469 Cardiac arrest, cause unspecified: Secondary | ICD-10-CM | POA: Diagnosis not present

## 2024-04-20 DIAGNOSIS — Z803 Family history of malignant neoplasm of breast: Secondary | ICD-10-CM

## 2024-04-20 DIAGNOSIS — Z86718 Personal history of other venous thrombosis and embolism: Secondary | ICD-10-CM

## 2024-04-20 DIAGNOSIS — Z8249 Family history of ischemic heart disease and other diseases of the circulatory system: Secondary | ICD-10-CM | POA: Diagnosis not present

## 2024-04-20 DIAGNOSIS — I10 Essential (primary) hypertension: Secondary | ICD-10-CM | POA: Diagnosis not present

## 2024-04-20 DIAGNOSIS — Z888 Allergy status to other drugs, medicaments and biological substances status: Secondary | ICD-10-CM

## 2024-04-20 DIAGNOSIS — I132 Hypertensive heart and chronic kidney disease with heart failure and with stage 5 chronic kidney disease, or end stage renal disease: Secondary | ICD-10-CM | POA: Diagnosis not present

## 2024-04-20 DIAGNOSIS — I462 Cardiac arrest due to underlying cardiac condition: Secondary | ICD-10-CM | POA: Diagnosis present

## 2024-04-20 DIAGNOSIS — I5043 Acute on chronic combined systolic (congestive) and diastolic (congestive) heart failure: Secondary | ICD-10-CM | POA: Diagnosis not present

## 2024-04-20 DIAGNOSIS — G9389 Other specified disorders of brain: Secondary | ICD-10-CM | POA: Diagnosis not present

## 2024-04-20 DIAGNOSIS — I959 Hypotension, unspecified: Secondary | ICD-10-CM | POA: Diagnosis present

## 2024-04-20 DIAGNOSIS — R918 Other nonspecific abnormal finding of lung field: Secondary | ICD-10-CM | POA: Diagnosis not present

## 2024-04-20 DIAGNOSIS — J9690 Respiratory failure, unspecified, unspecified whether with hypoxia or hypercapnia: Secondary | ICD-10-CM | POA: Diagnosis not present

## 2024-04-20 DIAGNOSIS — I5040 Unspecified combined systolic (congestive) and diastolic (congestive) heart failure: Secondary | ICD-10-CM | POA: Diagnosis not present

## 2024-04-20 DIAGNOSIS — J811 Chronic pulmonary edema: Secondary | ICD-10-CM | POA: Diagnosis not present

## 2024-04-20 LAB — COMPREHENSIVE METABOLIC PANEL WITH GFR
ALT: 17 U/L (ref 0–44)
AST: 35 U/L (ref 15–41)
Albumin: 2.6 g/dL — ABNORMAL LOW (ref 3.5–5.0)
Alkaline Phosphatase: 89 U/L (ref 38–126)
Anion gap: 12 (ref 5–15)
BUN: 19 mg/dL (ref 8–23)
CO2: 24 mmol/L (ref 22–32)
Calcium: 9 mg/dL (ref 8.9–10.3)
Chloride: 98 mmol/L (ref 98–111)
Creatinine, Ser: 3.05 mg/dL — ABNORMAL HIGH (ref 0.61–1.24)
GFR, Estimated: 21 mL/min — ABNORMAL LOW (ref >60)
Glucose, Bld: 149 mg/dL — ABNORMAL HIGH (ref 70–99)
Potassium: 3.3 mmol/L — ABNORMAL LOW (ref 3.5–5.1)
Sodium: 134 mmol/L — ABNORMAL LOW (ref 135–145)
Total Bilirubin: 0.6 mg/dL (ref 0.0–1.2)
Total Protein: 6.3 g/dL — ABNORMAL LOW (ref 6.5–8.1)

## 2024-04-20 LAB — I-STAT ARTERIAL BLOOD GAS, ED
Acid-Base Excess: 2 mmol/L (ref 0.0–2.0)
Bicarbonate: 27.3 mmol/L (ref 20.0–28.0)
Calcium, Ion: 1.28 mmol/L (ref 1.15–1.40)
HCT: 27 % — ABNORMAL LOW (ref 39.0–52.0)
Hemoglobin: 9.2 g/dL — ABNORMAL LOW (ref 13.0–17.0)
O2 Saturation: 100 %
Patient temperature: 98.6
Potassium: 3.2 mmol/L — ABNORMAL LOW (ref 3.5–5.1)
Sodium: 135 mmol/L (ref 135–145)
TCO2: 29 mmol/L (ref 22–32)
pCO2 arterial: 45.8 mmHg (ref 32–48)
pH, Arterial: 7.383 (ref 7.35–7.45)
pO2, Arterial: 228 mmHg — ABNORMAL HIGH (ref 83–108)

## 2024-04-20 LAB — CBC
HCT: 29.2 % — ABNORMAL LOW (ref 39.0–52.0)
HCT: 32 % — ABNORMAL LOW (ref 39.0–52.0)
Hemoglobin: 9 g/dL — ABNORMAL LOW (ref 13.0–17.0)
Hemoglobin: 10.3 g/dL — ABNORMAL LOW (ref 13.0–17.0)
MCH: 28.2 pg (ref 26.0–34.0)
MCH: 28.5 pg (ref 26.0–34.0)
MCHC: 30.8 g/dL (ref 30.0–36.0)
MCHC: 32.2 g/dL (ref 30.0–36.0)
MCV: 91.5 fL (ref 80.0–100.0)
MCV: 88.4 fL (ref 80.0–100.0)
Platelets: 179 K/uL (ref 150–400)
Platelets: 206 K/uL (ref 150–400)
RBC: 3.19 MIL/uL — ABNORMAL LOW (ref 4.22–5.81)
RBC: 3.62 MIL/uL — ABNORMAL LOW (ref 4.22–5.81)
RDW: 16.1 % — ABNORMAL HIGH (ref 11.5–15.5)
RDW: 16 % — ABNORMAL HIGH (ref 11.5–15.5)
WBC: 3.4 K/uL — ABNORMAL LOW (ref 4.0–10.5)
WBC: 6.7 K/uL (ref 4.0–10.5)
nRBC: 0 % (ref 0.0–0.2)
nRBC: 0 % (ref 0.0–0.2)

## 2024-04-20 LAB — GLUCOSE, CAPILLARY
Glucose-Capillary: 107 mg/dL — ABNORMAL HIGH (ref 70–99)
Glucose-Capillary: 113 mg/dL — ABNORMAL HIGH (ref 70–99)
Glucose-Capillary: 82 mg/dL (ref 70–99)
Glucose-Capillary: 91 mg/dL (ref 70–99)
Glucose-Capillary: 91 mg/dL (ref 70–99)
Glucose-Capillary: 92 mg/dL (ref 70–99)

## 2024-04-20 LAB — I-STAT CG4 LACTIC ACID, ED: Lactic Acid, Venous: 4.8 mmol/L (ref 0.5–1.9)

## 2024-04-20 LAB — TYPE AND SCREEN
ABO/RH(D): B POS
Antibody Screen: NEGATIVE

## 2024-04-20 LAB — I-STAT CHEM 8, ED
BUN: 19 mg/dL (ref 8–23)
Calcium, Ion: 1.22 mmol/L (ref 1.15–1.40)
Chloride: 97 mmol/L — ABNORMAL LOW (ref 98–111)
Creatinine, Ser: 3.1 mg/dL — ABNORMAL HIGH (ref 0.61–1.24)
Glucose, Bld: 151 mg/dL — ABNORMAL HIGH (ref 70–99)
HCT: 31 % — ABNORMAL LOW (ref 39.0–52.0)
Hemoglobin: 10.5 g/dL — ABNORMAL LOW (ref 13.0–17.0)
Potassium: 3.2 mmol/L — ABNORMAL LOW (ref 3.5–5.1)
Sodium: 135 mmol/L (ref 135–145)
TCO2: 25 mmol/L (ref 22–32)

## 2024-04-20 LAB — APTT
aPTT: 48 s — ABNORMAL HIGH (ref 24–36)
aPTT: 77 s — ABNORMAL HIGH (ref 24–36)

## 2024-04-20 LAB — CREATININE, SERUM
Creatinine, Ser: 3.21 mg/dL — ABNORMAL HIGH (ref 0.61–1.24)
GFR, Estimated: 19 mL/min — ABNORMAL LOW (ref >60)

## 2024-04-20 LAB — TROPONIN I (HIGH SENSITIVITY)
Troponin I (High Sensitivity): 31 ng/L — ABNORMAL HIGH (ref <18)
Troponin I (High Sensitivity): 59 ng/L — ABNORMAL HIGH (ref <18)

## 2024-04-20 LAB — MAGNESIUM: Magnesium: 1.9 mg/dL (ref 1.7–2.4)

## 2024-04-20 LAB — ECHOCARDIOGRAM COMPLETE
AR max vel: 2.71 cm2
AV Area VTI: 2.42 cm2
AV Area mean vel: 2.58 cm2
AV Mean grad: 4 mmHg
AV Peak grad: 6.7 mmHg
Ao pk vel: 1.29 m/s
Area-P 1/2: 3.46 cm2
Height: 68 in
S' Lateral: 4.2 cm
Single Plane A2C EF: 35.7 %
Weight: 2687.85 [oz_av]

## 2024-04-20 LAB — MRSA NEXT GEN BY PCR, NASAL: MRSA by PCR Next Gen: NOT DETECTED

## 2024-04-20 LAB — PHOSPHORUS: Phosphorus: 4.5 mg/dL (ref 2.5–4.6)

## 2024-04-20 LAB — BRAIN NATRIURETIC PEPTIDE: B Natriuretic Peptide: 2037.8 pg/mL — ABNORMAL HIGH (ref 0.0–100.0)

## 2024-04-20 LAB — TRIGLYCERIDES: Triglycerides: 40 mg/dL (ref <150)

## 2024-04-20 LAB — PROTIME-INR
INR: 1.9 — ABNORMAL HIGH (ref 0.8–1.2)
Prothrombin Time: 22.8 s — ABNORMAL HIGH (ref 11.4–15.2)

## 2024-04-20 LAB — HEMOGLOBIN A1C
Hgb A1c MFr Bld: 3.7 % — ABNORMAL LOW (ref 4.8–5.6)
Mean Plasma Glucose: 59.49 mg/dL

## 2024-04-20 LAB — HEPARIN LEVEL (UNFRACTIONATED): Heparin Unfractionated: >1.1 [IU]/mL — ABNORMAL HIGH (ref 0.30–0.70)

## 2024-04-20 MED ORDER — POLYETHYLENE GLYCOL 3350 17 G PO PACK: 17.0000 g | PACK | Freq: Every day | ORAL | Status: DC | PRN

## 2024-04-20 MED ORDER — FENTANYL CITRATE PF 50 MCG/ML IJ SOSY: 25.0000 ug | PREFILLED_SYRINGE | INTRAMUSCULAR | Status: DC | PRN

## 2024-04-20 MED ORDER — INSULIN ASPART 100 UNIT/ML IJ SOLN
0.0000 [IU] | INTRAMUSCULAR | Status: DC
  Administered 2024-04-24 – 2024-04-27 (×7): 1 [IU] via SUBCUTANEOUS

## 2024-04-20 MED ORDER — ALBUTEROL SULFATE (2.5 MG/3ML) 0.083% IN NEBU: 2.5000 mg | INHALATION_SOLUTION | RESPIRATORY_TRACT | Status: DC | PRN

## 2024-04-20 MED ORDER — LEVETIRACETAM 750 MG PO TABS: 750.0000 mg | ORAL_TABLET | Freq: Every day | ORAL | Status: DC

## 2024-04-20 MED ORDER — CHLORHEXIDINE GLUCONATE CLOTH 2 % EX PADS
6.0000 | MEDICATED_PAD | Freq: Every day | CUTANEOUS | Status: DC
  Administered 2024-04-20 – 2024-04-24 (×4): 6 via TOPICAL

## 2024-04-20 MED ORDER — FAMOTIDINE 20 MG PO TABS
20.0000 mg | ORAL_TABLET | Freq: Every day | ORAL | Status: DC
  Administered 2024-04-20 – 2024-04-27 (×8): 20 mg
  Filled 2024-04-20 (×8): qty 1

## 2024-04-20 MED ORDER — ORAL CARE MOUTH RINSE: 15.0000 mL | OROMUCOSAL | Status: DC

## 2024-04-20 MED ORDER — ASPIRIN 300 MG RE SUPP: 300.0000 mg | RECTAL | Status: AC

## 2024-04-20 MED ORDER — FAMOTIDINE 20 MG PO TABS: 20.0000 mg | ORAL_TABLET | Freq: Two times a day (BID) | ORAL | Status: DC

## 2024-04-20 MED ORDER — HEPARIN (PORCINE) 25000 UT/250ML-% IV SOLN
1700.0000 [IU]/h | INTRAVENOUS | Status: DC
  Administered 2024-04-20: 1250 [IU]/h via INTRAVENOUS
  Administered 2024-04-21 – 2024-04-22 (×2): 1200 [IU]/h via INTRAVENOUS
  Administered 2024-04-23: 1300 [IU]/h via INTRAVENOUS
  Administered 2024-04-23: 1550 [IU]/h via INTRAVENOUS
  Filled 2024-04-20 (×5): qty 250

## 2024-04-20 MED ORDER — NOREPINEPHRINE 4 MG/250ML-% IV SOLN
INTRAVENOUS | Status: AC
Start: 2024-04-20 — End: 2024-04-20
  Filled 2024-04-20: qty 250

## 2024-04-20 MED ORDER — FENTANYL CITRATE PF 50 MCG/ML IJ SOSY
25.0000 ug | PREFILLED_SYRINGE | INTRAMUSCULAR | Status: DC | PRN
  Administered 2024-04-20 – 2024-04-21 (×4): 100 ug via INTRAVENOUS
  Administered 2024-04-21 – 2024-04-24 (×5): 50 ug via INTRAVENOUS
  Administered 2024-04-24: 100 ug via INTRAVENOUS
  Administered 2024-04-25: 50 ug via INTRAVENOUS
  Administered 2024-04-25: 100 ug via INTRAVENOUS
  Administered 2024-04-25: 50 ug via INTRAVENOUS
  Administered 2024-04-25: 100 ug via INTRAVENOUS
  Administered 2024-04-25: 50 ug via INTRAVENOUS
  Administered 2024-04-26 (×6): 100 ug via INTRAVENOUS
  Administered 2024-04-26: 50 ug via INTRAVENOUS
  Administered 2024-04-26 – 2024-04-27 (×3): 100 ug via INTRAVENOUS
  Administered 2024-04-27: 50 ug via INTRAVENOUS
  Filled 2024-04-20: qty 1
  Filled 2024-04-20 (×4): qty 2
  Filled 2024-04-20 (×2): qty 1
  Filled 2024-04-20 (×2): qty 2
  Filled 2024-04-20: qty 1
  Filled 2024-04-20 (×5): qty 2
  Filled 2024-04-20: qty 1
  Filled 2024-04-20 (×3): qty 2
  Filled 2024-04-20: qty 1
  Filled 2024-04-20: qty 2
  Filled 2024-04-20 (×2): qty 1
  Filled 2024-04-20 (×3): qty 2
  Filled 2024-04-20: qty 1

## 2024-04-20 MED ORDER — LEVETIRACETAM 500 MG PO TABS
500.0000 mg | ORAL_TABLET | Freq: Two times a day (BID) | ORAL | Status: DC
  Administered 2024-04-20 – 2024-04-24 (×8): 500 mg
  Filled 2024-04-20 (×8): qty 1

## 2024-04-20 MED ORDER — DOCUSATE SODIUM 50 MG/5ML PO LIQD: 100.0000 mg | Freq: Two times a day (BID) | ORAL | Status: DC | PRN

## 2024-04-20 MED ORDER — FENTANYL 2500MCG IN NS 250ML (10MCG/ML) PREMIX INFUSION
0.0000 ug/h | INTRAVENOUS | Status: DC
  Filled 2024-04-20: qty 250

## 2024-04-20 MED ORDER — SODIUM CHLORIDE 0.9 % IV SOLN: 250.0000 mL | INTRAVENOUS | Status: AC

## 2024-04-20 MED ORDER — ASPIRIN 81 MG PO CHEW
324.0000 mg | CHEWABLE_TABLET | ORAL | Status: AC
  Administered 2024-04-20: 324 mg
  Filled 2024-04-20: qty 4

## 2024-04-20 MED ORDER — NOREPINEPHRINE 4 MG/250ML-% IV SOLN
0.0000 ug/min | INTRAVENOUS | Status: DC
  Administered 2024-04-20 – 2024-04-22 (×2): 5 ug/min via INTRAVENOUS
  Filled 2024-04-20: qty 250

## 2024-04-20 MED ORDER — PROPOFOL 1000 MG/100ML IV EMUL
0.0000 ug/kg/min | INTRAVENOUS | Status: DC
  Administered 2024-04-20: 45 ug/kg/min via INTRAVENOUS
  Administered 2024-04-20: 25 ug/kg/min via INTRAVENOUS
  Administered 2024-04-20: 15 ug/kg/min via INTRAVENOUS
  Administered 2024-04-20: 5 ug/kg/min via INTRAVENOUS
  Administered 2024-04-21 (×2): 45 ug/kg/min via INTRAVENOUS
  Administered 2024-04-21: 35 ug/kg/min via INTRAVENOUS
  Administered 2024-04-21 – 2024-04-22 (×4): 45 ug/kg/min via INTRAVENOUS
  Administered 2024-04-22: 30 ug/kg/min via INTRAVENOUS
  Administered 2024-04-23: 10 ug/kg/min via INTRAVENOUS
  Administered 2024-04-23 (×2): 20 ug/kg/min via INTRAVENOUS
  Administered 2024-04-24: 25 ug/kg/min via INTRAVENOUS
  Administered 2024-04-24: 20 ug/kg/min via INTRAVENOUS
  Filled 2024-04-20 (×15): qty 100

## 2024-04-20 MED ORDER — ORAL CARE MOUTH RINSE
15.0000 mL | OROMUCOSAL | Status: DC | PRN
  Administered 2024-04-21 – 2024-04-22 (×4): 15 mL via OROMUCOSAL

## 2024-04-20 MED ORDER — HEPARIN SODIUM (PORCINE) 5000 UNIT/ML IJ SOLN: 5000.0000 [IU] | Freq: Three times a day (TID) | INTRAMUSCULAR | Status: DC

## 2024-04-20 NOTE — TOC CM/SW Note (Signed)
 Transition of Care [facility]) - Inpatient Brief Assessment   Patient Details  Name: Mitchell Huaracha Sr. MRN: [id number] Date of Birth: [birth year]  Transition of [facility]) CM/SW Contact:    Lauraine FORBES Saa, LCSW Phone Number: 04/20/2024, 10:02 AM   Clinical Narrative:  10:02 AM Per chart review, patient has a PCP and insurance. Patient does not have HH history. Patient has DME (rolling walker, CPAP) history. Patient has SNF history with [facility]. Patient's preferred pharmacy is [facility] [address]. Patient does not have SDOH needs as of October 31st, 2023. Patient is currently intubated and unable to answer SDOH/admission questions for current hospitalization. No TOC needs were identified at this time. TOC will continue to follow and be available to assist.  Transition of Care Asessment: Insurance and Status: Insurance coverage has been reviewed Patient has primary care physician: Yes Home environment has been reviewed: Private Residence Prior level of function:: N/A Prior/Current Home Services: No current home services Social Drivers of Health Review:  (As of October 31st, 2023) Readmission risk has been reviewed: Yes Transition of care needs: no transition of care needs at this time

## 2024-04-20 NOTE — ED Provider Notes (Signed)
 [facility] 40M MEDICAL ICU Provider Note  CSN: [id number] Arrival date & time: 04/20/24 0249  Chief Complaint(s) Cardiac Arrest  HPI Mitchell Frankson Sr. is a 73 y.o. male with a past medical history listed below including ESRD on dialysis through PermCath in the right chest, heart failure with a last EF of 20% here for cardiac arrest witnessed by family at 0201 a.m. bystander CPR initiated at home.  Fire department and EMS arrived.  Patient was intubated with a 8.0 ET tube.  He was noted to be in PEA arrest, given 1 round of epi, bicarb, and calcium .  ROSC obtained at 02:14 AM.  EMS reports that after ROSC, patient was combative and required Versed  and fentanyl .  Blood pressure at that time was 200 palp.  Last blood pressure in the 120s.  The history is provided by the EMS personnel.    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)    Seizures (HCC)    Thrombocytopenia (HCC)    Patient Active Problem List   Diagnosis Date Noted   Monoclonal gammopathy 08/11/2022   Prostate abscess 09/23/2021   CKD (chronic kidney disease) stage 3, GFR 30-59 ml/min (HCC) 09/23/2021   Kidney lesion, native, bilateral 09/23/2021   Sleep apnea 03/23/2021   Excessive sleepiness 09/23/2020   Seizures (HCC) 06/29/2018   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) 06/29/2018   Normochromic normocytic anemia 06/22/2018   Accelerated hypertension 06/20/2018   Seizure disorder (HCC) 06/15/2018   History of substance abuse (HCC) 06/15/2018   Myoclonus 06/15/2018   Abnormal EKG 06/15/2018   CVA (cerebral  vascular accident) (HCC) 06/07/2018   Acute pancreatitis 03/08/2017   Chronic combined systolic and diastolic CHF (congestive heart failure) (HCC) 07/28/2015   PEG (percutaneous endoscopic gastrostomy) status (HCC) 07/28/2015   History of CVA (cerebrovascular accident) 07/28/2015   Hypomagnesemia    Primary gout    Altered mental status    Dysphagia    Acute respiratory failure with hypoxemia (HCC)    Pulmonary edema    Tracheal perforation    CAP (community acquired pneumonia)    Chronic systolic CHF (congestive heart failure) (HCC)    Acute renal failure (HCC)    Alcohol abuse    Anoxic brain injury (HCC)    Thrombocytopenia (HCC)    History of ETT    Cardiac arrest (HCC) 07/08/2015   URINARY TRACT INFECTION, RECURRENT 01/04/2007   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 Authorizing Provider  carvedilol  (COREG ) 12.5 MG tablet Take 12.5 mg by mouth 2 (two) times daily. 04/17/24  Yes [provider]  triamcinolone cream (KENALOG) 0.1 % Apply 1 Application topically 2 (two) times daily. 04/04/24  Yes [provider]  acetaminophen  (TYLENOL ) 325 MG tablet Take 2 tablets (650 mg total)  by mouth every 6 (six) hours as needed for mild pain, moderate pain or fever (or Fever >/= 101). Patient taking differently: Take 325-650 mg by mouth every 6 (six) hours as needed for mild pain (pain score 1-3), moderate pain (pain score 4-6) or fever (or Fever >/= 101). 03/09/17   Hongalgi, Anand D, MD  allopurinol  (ZYLOPRIM ) 100 MG tablet Take 100 mg by mouth daily.    [provider]  amLODipine  (NORVASC ) 10 MG tablet Take 1 tablet (10 mg total) by mouth daily. 08/01/18   Medina-Vargas, Monina C, NP   Calcium  Carb-Cholecalciferol  (OYSTER SHELL CALCIUM  W/D) 500-5 MG-MCG TABS Take 1 tablet by mouth 2 (two) times daily. 11/06/23   [provider]  carvedilol  (COREG ) 25 MG tablet Take 1 tablet (25 mg total) by mouth 2 (two) times daily with a meal. 08/01/18   Medina-Vargas, Monina C, NP  cetirizine (ZYRTEC) 10 MG tablet Take 10 mg by mouth 2 (two) times daily.    [provider]  cholecalciferol  (VITAMIN D3) 25 MCG (1000 UNIT) tablet Take 1,000 Units by mouth daily.    [provider]  ferrous sulfate  325 (65 FE) MG tablet Take 1 tablet (325 mg total) by mouth daily with breakfast. 08/01/18   Medina-Vargas, Monina C, NP  folic acid  (FOLVITE ) 1 MG tablet Take 1 tablet (1 mg total) by mouth daily. 08/01/18   Medina-Vargas, Monina C, NP  furosemide  (LASIX ) 20 MG tablet Take 1 tablet (20 mg total) by mouth daily. 09/30/21   Sebastian Toribio GAILS, MD  levETIRAcetam  (KEPPRA  XR) 750 MG 24 hr tablet Take 2 tablets (1,500 mg total) by mouth at bedtime. 08/30/23   Onita Duos, MD  pantoprazole  (PROTONIX ) 40 MG tablet Take 1 tablet (40 mg total) by mouth daily. 08/01/18   Medina-Vargas, Monina C, NP  rivaroxaban  (XARELTO ) 20 MG TABS tablet Take 1 tablet (20 mg total) by mouth daily with supper. 09/28/21   Sebastian Toribio GAILS, MD  sodium bicarbonate  650 MG tablet Take 1,300 mg by mouth 2 (two) times daily. 10/12/22   [provider]                                                                                                                                    Allergies Hydrochlorothiazide and Pseudoephedrine  Review of Systems Review of Systems As noted in HPI  Physical Exam Vital Signs  I have reviewed the triage vital signs BP 136/75   Pulse 70   Temp (!) 97.3 F (36.3 C) (Oral)   Resp 18   Ht 5' 8 (1.727 m)   Wt 76.2 kg   SpO2 98%   BMI 25.54 kg/m   Physical Exam Vitals reviewed.  Constitutional:      Appearance: He is well-developed. He is ill-appearing.  HENT:      Head: Normocephalic and atraumatic.     Nose: Nose normal.  Eyes:  Conjunctiva/sclera: Conjunctivae normal.     Pupils: Pupils are equal, round, and reactive to light.  Neck:     Comments: EJ in place Cardiovascular:     Rate and Rhythm: Normal rate and regular rhythm.     Heart sounds: No murmur heard.    No friction rub. No gallop.  Pulmonary:     Effort: Pulmonary effort is normal.     Breath sounds: Normal breath sounds. No decreased air movement. No rales.     Comments: 8.0 ETT in place Abdominal:     General: There is no distension.     Palpations: Abdomen is soft.  Musculoskeletal:     Cervical back: Normal range of motion and neck supple.       Legs:  Skin:    General: Skin is warm and dry.     Findings: No erythema or rash.  Neurological:     Mental Status: He is unresponsive.     GCS: GCS eye subscore is 1. GCS verbal subscore is 1. GCS motor subscore is 1.     ED Results and Treatments Labs (all labs ordered are listed, but only abnormal results are displayed) Labs Reviewed  COMPREHENSIVE METABOLIC PANEL WITH GFR - Abnormal; Notable for the following components:      Result Value   Sodium 134 (*)    Potassium 3.3 (*)    Glucose, Bld 149 (*)    Creatinine, Ser 3.05 (*)    Total Protein 6.3 (*)    Albumin 2.6 (*)    GFR, Estimated 21 (*)    All other components within normal limits  CBC - Abnormal; Notable for the following components:   WBC 3.4 (*)    RBC 3.19 (*)    Hemoglobin 9.0 (*)    HCT 29.2 (*)    RDW 16.1 (*)    All other components within normal limits  APTT - Abnormal; Notable for the following components:   aPTT 48 (*)    All other components within normal limits  PROTIME-INR - Abnormal; Notable for the following components:   Prothrombin Time 22.8 (*)    INR 1.9 (*)    All other components within normal limits  GLUCOSE, CAPILLARY - Abnormal; Notable for the following components:   Glucose-Capillary 107 (*)    All other components  within normal limits  I-STAT CG4 LACTIC ACID, ED - Abnormal; Notable for the following components:   Lactic Acid, Venous 4.8 (*)    All other components within normal limits  I-STAT CHEM 8, ED - Abnormal; Notable for the following components:   Potassium 3.2 (*)    Chloride 97 (*)    Creatinine, Ser 3.10 (*)    Glucose, Bld 151 (*)    Hemoglobin 10.5 (*)    HCT 31.0 (*)    All other components within normal limits  I-STAT ARTERIAL BLOOD GAS, ED - Abnormal; Notable for the following components:   pO2, Arterial 228 (*)    Potassium 3.2 (*)    HCT 27.0 (*)    Hemoglobin 9.2 (*)    All other components within normal limits  TROPONIN I (HIGH SENSITIVITY) - Abnormal; Notable for the following components:   Troponin I (High Sensitivity) 31 (*)    All other components within normal limits  CULTURE, BLOOD (ROUTINE X 2)  CULTURE, BLOOD (ROUTINE X 2)  CULTURE, RESPIRATORY W GRAM STAIN  URINE CULTURE  MRSA NEXT GEN BY PCR, NASAL  MAGNESIUM   PHOSPHORUS  TRIGLYCERIDES  BLOOD GAS, ARTERIAL  RAPID URINE DRUG SCREEN, HOSP PERFORMED  URINALYSIS, ROUTINE W REFLEX MICROSCOPIC  CBC  CREATININE, SERUM  HEMOGLOBIN A1C  BRAIN NATRIURETIC PEPTIDE  CBG MONITORING, ED  I-STAT CG4 LACTIC ACID, ED  I-STAT CG4 LACTIC ACID, ED  I-STAT CG4 LACTIC ACID, ED  TYPE AND SCREEN  TROPONIN I (HIGH SENSITIVITY)                                                                                                                         EKG  EKG Interpretation Date/Time:  Friday April 20 2024 02:54:07 EDT Ventricular Rate:  75 PR Interval:  206 QRS Duration:  115 QT Interval:  443 QTC Calculation: 495 R Axis:   55  Text Interpretation: Sinus rhythm Left atrial enlargement LVH with secondary repolarization abnormality Borderline prolonged QT interval Baseline wander in lead(s) II aVF V3 V4 Confirmed by Trine Likes [phone]) on 04/20/2024 3:13:31 AM       Radiology CT HEAD WO CONTRAST Result Date:  04/20/2024 CLINICAL DATA:  Cardiac arrest, intubated, mental status changes. EXAM: CT HEAD WITHOUT CONTRAST TECHNIQUE: Contiguous axial images were obtained from the base of the skull through the vertex without intravenous contrast. RADIATION DOSE REDUCTION: This exam was performed according to the departmental dose-optimization program which includes automated exposure control, adjustment of the mA and/or kV according to patient size and/or use of iterative reconstruction technique. COMPARISON:  MRI brain 06/08/2018. FINDINGS: Brain: New broad area of right parieto-occipital encephalomalacia is noted, with ex vacuo expansion of the posterior horn right lateral ventricle, consistent with either a late-subacute or more likely a chronic infarct. Some extension of encephalomalacia to the occipitotemporal watershed also appears present. Elsewhere there is mild cerebral atrophy and small-vessel disease with normal volume and attenuation in the cerebellum and brainstem. Rest of the ventricles are normal in size. No acute cortical based infarct, hemorrhage, mass effect or midline shift are seen. Basal cisterns are clear. Vascular: Patchy calcific plaque both siphons. No hyperdense central vessel is seen. Skull: Negative for fractures or focal lesions. Sinuses/Orbits: No acute abnormality. Interval right lens replacement. There is a dysconjugate gaze, otherwise negative orbits. Clear sinuses and mastoids apart from a 1 cm retention cyst in the floor of the left maxillary sinus. Other: Oro midline nasal septum. Tracheal and oroenteric intubation partially visible. IMPRESSION: 1. No acute intracranial CT findings. 2. New broad area of right parieto-occipital encephalomalacia with ex vacuo expansion of the posterior horn right lateral ventricle, consistent with either a late-subacute or more likely a chronic infarct. 3. Mild atrophy and small-vessel disease. 4. Dysconjugate gaze. Electronically Signed   By: Francis Quam M.D.    On: 04/20/2024 04:35   DG Abd Portable 1V Result Date: 04/20/2024 EXAM: 1 VIEW XRAY OF THE ABDOMEN 04/20/2024 03:12:24 AM COMPARISON: None available. CLINICAL HISTORY: Encounter for OG tube placement. FINDINGS: BOWEL: Nonobstructive bowel gas pattern. SOFT TISSUES: No abnormal calcifications or opaque urinary calculi. Enteric tube terminates in the gastric cardia,  in satisfactory position. BONES: No acute osseous abnormality. IMPRESSION: 1. Enteric tube terminates in the gastric cardia, in satisfactory position. Electronically signed by: Pinkie Pebbles MD 04/20/2024 03:17 AM EDT RP Workstation: [id number]   DG Chest Port 1 View Result Date: 04/20/2024 EXAM: 1 VIEW XRAY OF THE CHEST 04/20/2024 03:12:24 AM COMPARISON: 03/02/2024 CLINICAL HISTORY: Respiratory failure. FINDINGS: LUNGS AND PLEURA: Increased interstitial markings, right greater than left, suggesting asymmetric interstitial edema over aspiration. No focal pulmonary opacity. No pleural effusion. No pneumothorax. HEART AND MEDIASTINUM: No acute abnormality of the cardiac and mediastinal silhouettes. BONES AND SOFT TISSUES: No acute osseous abnormality. Defibrillator pads overlying the left hemithorax. LINES AND TUBES: Right IJ venous catheter terminates in the right atrium. Endotracheal tube terminates 2.5 cm above the carina. Lines and tubes are in appropriate position. IMPRESSION: 1. Increased interstitial markings, favoring asymmetric interstitial edema over aspiration. 2. Support apparatus as above. Electronically signed by: Pinkie Pebbles MD 04/20/2024 03:17 AM EDT RP Workstation: [id number]    Medications Ordered in ED Medications  propofol  (DIPRIVAN ) 1000 MG/100ML infusion (15 mcg/kg/min  76.2 kg Intravenous Infusion Verify 04/20/24 0700)  Chlorhexidine  Gluconate Cloth 2 % PADS 6 each (6 each Topical Given 04/20/24 0609)  fentaNYL  (SUBLIMAZE ) injection 25-100 mcg (100 mcg Intravenous Given 04/20/24 0544)  0.9 %  sodium chloride   infusion (0 mLs Intravenous Hold 04/20/24 0334)  norepinephrine  (LEVOPHED ) 4mg  in (0.016 mg/mL) premix infusion (3 mcg/min Intravenous Infusion Verify 04/20/24 0700)  docusate (COLACE) 50 MG/5ML liquid 100 mg (has no administration in time range)  polyethylene glycol (MIRALAX  / GLYCOLAX ) packet 17 g (has no administration in time range)  heparin  injection 5,000 Units (has no administration in time range)  albuterol (PROVENTIL) (2.5 MG/3ML) 0.083% nebulizer solution 2.5 mg (has no administration in time range)  insulin aspart (novoLOG) injection 0-6 Units ( Subcutaneous Not Given 04/20/24 0626)  famotidine (PEPCID) tablet 20 mg (has no administration in time range)  Oral care mouth rinse (has no administration in time range)  aspirin  chewable tablet 324 mg (324 mg Per Tube Given 04/20/24 0637)    Or  aspirin  suppository 300 mg ( Rectal See Alternative 04/20/24 9362)   Procedures .Critical Care  Performed by: Trine Raynell Moder, MD Authorized by: Trine Raynell Moder, MD   Critical care provider statement:    Critical care time (minutes):  45   Critical care time was exclusive of:  Separately billable procedures and treating other patients   Critical care was necessary to treat or prevent imminent or life-threatening deterioration of the following conditions:  Circulatory failure and cardiac failure   Critical care was time spent personally by me on the following activities:  Development of treatment plan with patient or surrogate, discussions with consultants, evaluation of patient's response to treatment, examination of patient, obtaining history from patient or surrogate, review of old charts, re-evaluation of patient's condition, pulse oximetry, ordering and review of radiographic studies, ordering and review of laboratory studies and ordering and performing treatments and interventions   Care discussed with: admitting provider     (including critical care time) Medical Decision  Making / ED Course   Medical Decision Making Amount and/or Complexity of Data Reviewed Labs: ordered. Decision-making details documented in ED Course. Radiology: ordered and independent interpretation performed. Decision-making details documented in ED Course. ECG/medicine tests: ordered and independent interpretation performed. Decision-making details documented in ED Course.  Risk Prescription drug management. Decision regarding hospitalization.    Witnessed cardiac arrest Initial rhythm PEA by EMS ROSC obtained 13  minutes after cardiac arrest Intubated Currently hemodynamically stable  ET tube placement confirmed EKG without acute ischemic changes, dysrhythmias or blocks. Initial blood test without hyperkalemia Chest x-ray notable for asymmetric interstitial edema.  CT head without ICH.  During workup, patient's blood pressure trended down to systolics in the 80s.  Bedside echo with mild pericardial effusion without evidence of tamponade.  IVC not collapsible. He was started on peripheral pressors.  I spoke with Dr. Layman from the ICU who admitted patient for further workup and management.    Final Clinical Impression(s) / ED Diagnoses Final diagnoses:  Cardiac arrest [facility])    This chart was dictated using voice recognition software.  Despite best efforts to proofread,  errors can occur which can change the documentation meaning.    Trine Raynell Moder, MD 04/20/24 [phone]

## 2024-04-20 NOTE — ED Notes (Signed)
 This NT attempted to insert urethral cath on Pt with no success. Rn notified.

## 2024-04-20 NOTE — Progress Notes (Signed)
 PHARMACY - ANTICOAGULATION CONSULT NOTE  Pharmacy Consult for heparin  gtt Indication: DVT  Allergies  Allergen Reactions   Hydrochlorothiazide     precipitates gout   Pseudoephedrine Rash    Patient Measurements: Height: 5' 8 (172.7 cm) Weight: 76.2 kg (167 lb 15.9 oz) IBW/kg (Calculated) : 68.4 HEPARIN  DW (KG): 76.2  Vital Signs: Temp: 99.1 F (37.3 C) (07/25 1512) Temp Source: Oral (07/25 1512) BP: 146/71 (07/25 1800) Pulse Rate: 76 (07/25 1800)  Labs: Recent Labs    04/20/24 0300 04/20/24 0314 04/20/24 0337 04/20/24 0822 04/20/24 1727  HGB 9.0* 10.5* 9.2* 10.3*  --   HCT 29.2* 31.0* 27.0* 32.0*  --   PLT 179  --   --  206  --   APTT 48*  --   --   --  77*  LABPROT 22.8*  --   --   --   --   INR 1.9*  --   --   --   --   HEPARINUNFRC  --   --   --   --  >1.10*  CREATININE 3.05* 3.10*  --  3.21*  --   TROPONINIHS 31*  --   --  59*  --     Estimated Creatinine Clearance: 19.5 mL/min (A) (by C-G formula based on SCr of 3.21 mg/dL (H)).   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 Pharmacy consulted to dose heparin  infusion. Unclear when was last dose of xarelto .  Heparin  level > 1.1 is still affected by rivaroxaban . APTT 77 is therapeutic on 1250 units/hr.  Goal of Therapy:  Heparin  level 0.3-0.7 units/ml aPTT 66-102 seconds Monitor platelets by anticoagulation protocol: Yes   Plan:  Continue Heparin  1250 units/hr  F/u aPTT until correlates with heparin  level  Monitor daily aPTT, heparin  level, CBC, signs/symptoms of bleeding  F/u restart rivaroxaban    Jinnie Door, PharmD, BCPS, BCCP Clinical Pharmacist  Please check AMION for all [facility] Pharmacy phone numbers After 10:00 PM, call Main Pharmacy [phone]

## 2024-04-20 NOTE — Progress Notes (Signed)
 Echocardiogram 2D Echocardiogram has been performed.  Koleen KANDICE Popper, RDCS 04/20/2024, 9:09 AM

## 2024-04-20 NOTE — Progress Notes (Signed)
 PHARMACY - ANTICOAGULATION CONSULT NOTE  Pharmacy Consult for heparin  gtt Indication: DVT  Allergies  Allergen Reactions   Hydrochlorothiazide     precipitates gout   Pseudoephedrine Rash    Patient Measurements: Height: 5' 8 (172.7 cm) Weight: 76.2 kg (167 lb 15.9 oz) IBW/kg (Calculated) : 68.4 HEPARIN  DW (KG): 76.2  Vital Signs: Temp: 97.3 F (36.3 C) (07/25 0732) Temp Source: Oral (07/25 0732) BP: 136/75 (07/25 0700) Pulse Rate: 70 (07/25 0700)  Labs: Recent Labs    04/20/24 0300 04/20/24 0314 04/20/24 0337  HGB 9.0* 10.5* 9.2*  HCT 29.2* 31.0* 27.0*  PLT 179  --   --   APTT 48*  --   --   LABPROT 22.8*  --   --   INR 1.9*  --   --   CREATININE 3.05* 3.10*  --   TROPONINIHS 31*  --   --     Estimated Creatinine Clearance: 20.2 mL/min (A) (by C-G formula based on SCr of 3.1 mg/dL (H)).   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 Pharmacy consulted to dose heparin  infusion. Unclear when was last dose of xarelto .  Hgb 9.0, Plt 179 Baseline: INR 1.9, aPTT 48    Goal of Therapy:  Heparin  level 0.3-0.7 units/ml aPTT 66-102 seconds Monitor platelets by anticoagulation protocol: Yes   Plan:  Heparin  1250 units/hr  8hr heparin  level, aPTT Daily HL, aPTT until clinically correlated then transition to HL only Daily CBC F/u s/sx bleeding and transition back to PTA therapy as able   Sharyne Glatter, PharmD, BCCCP Critical Care Clinical Pharmacist 04/20/2024 8:37 AM

## 2024-04-20 NOTE — Progress Notes (Signed)
 Transported patient to 2M12 without event.

## 2024-04-20 NOTE — Progress Notes (Signed)
 Transported patient to CT and back to [facility] without event.

## 2024-04-20 NOTE — Progress Notes (Signed)
 eLink Physician-Brief Progress Note Patient Name: Mitchell [name]. DOB: 1951/01/05 MRN: [id number]   Date of Service  04/20/2024  HPI/Events of Note  Per bedside RN patient was having involuntary movements at the beginning of the shift but appear to have been extinguished by increasing Propofol  gtt rate. Patient does have a history of seizures and is on Keppra . Neurologically, patient is consistently withdrawing to noxious stimuli per bedside RN.  eICU Interventions  cEEG ordered, will ask neurology to see if movements recur.        Denitra Donaghey U Khadejah Son 04/20/2024, 7:42 PM

## 2024-04-20 NOTE — H&P (Signed)
 NAME:  Mitchell Toledo., MRN:  [id number], DOB:  06/16/1951, LOS: 0 ADMISSION DATE:  04/20/2024, CONSULTATION DATE:  04/20/24 REFERRING MD:  EDP, CHIEF COMPLAINT:  cardiac arrest   History of Present Illness:  73 yo male presented after witnessed cardiac arrest. Pt was reportedly waking up to go to restroom just before 0200. When he was ambulating back from bathroom he became acutely sob and subsequently collapsed. No pulses felt and family began CPR. EMS notified and arrives pt was found to be in PEA, acls begun. Down time 14 mins. Per EMS report pt was combative post ROSC and did req sedation. When he presented to [facility] BP was trending down and levo was initiated. Neurologically, not purposeful but does have cough/gag and spontaneously moves LE.   Per family, pt was in his normal state of health prior to this. Compliant with dialysis sessions, which have been full sessions without decreased time. No recent illness, no fever, chills, cp, cough, dyspnea. No n/v/d   No frank laboratory abnormalities to explain arrest. CTH negative for acute issues. Stat echo pending, may warrant ctpa based on presentation.   Family at bedside provides history in light of pt's intubated status.  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 rate 18, height 5' 8 (1.727 m), weight 76.2 kg, SpO2 100%.    Vent Mode: PRVC FiO2 (%):  [50 %-70 %] 50 % Set Rate:  [18 bmp] 18 bmp Vt Set:  [540 mL] 540 mL PEEP:  [5 cmH20] 5 cmH20 Plateau Pressure:  [16 cmH20-19 cmH20] 19 cmH20  No intake or output data in the 24 hours ending  04/20/24 0521 Filed Weights   04/20/24 0253  Weight: 76.2 kg    Examination: General: unresponsive on vent, nad HENT: ncat, pupils perrla, arcus senilis bilaterally, mmmp Lungs: coarse b/l  Cardiovascular: rrr no m/g/r, R upper tunneled cather that site appears stabled Abdomen: soft, nt, nd, bs + Extremities: no c/c/e but LUE hand contracted, RUE hand with ulnar deviation noted Neuro: unresponsive. Spontaneously moving LE GU: deferred  Resolved problem list   Assessment and Plan  Oohca s/p rosc Acute on chronic, combined systolic/diastolic HF with acute exacerbation Esrd, adherent AOCD Hypokalemia Hyperglycemia H/o cva with contractures.  HTN Hyperlipidemia H/o MGUS H/o sz  -titrate vent -sat/sbt when able -prn nebs -diurese as BP allows -echo pending -consult nephro in am, no acute indication for intubation at this time.  -if mental status not improving consider eeg and MRI -no ancute indication for transfusion -titrate vasopressors for map >65 -ssi ongoing, recheck A1c -resume home meds as able -family continuing to discuss goals of care at this time. They are clear that he wishes for no long term life support.    Best Practice (right click and Reselect all SmartList Selections daily)   Diet/type: NPO w/ meds via tube DVT prophylaxis prophylactic heparin   Pressure ulcer(s): present on admission  GI prophylaxis: H2B Lines: N/A Foley:  N/A Code Status:  full code Last date of multidisciplinary goals of care discussion [ongoing with family]  Labs   CBC: Recent Labs  Lab 04/20/24 0300  04/20/24 0314 04/20/24 0337  WBC 3.4*  --   --   HGB 9.0* 10.5* 9.2*  HCT 29.2* 31.0* 27.0*  MCV 91.5  --   --   PLT 179  --   --     Basic Metabolic Panel: Recent Labs  Lab 04/20/24 0300 04/20/24 0314 04/20/24 0337  NA 134* 135 135  K 3.3* 3.2* 3.2*  CL 98 97*  --   CO2 24  --   --   GLUCOSE 149* 151*  --   BUN 19 19  --   CREATININE 3.05* 3.10*  --    CALCIUM  9.0  --   --   MG 1.9  --   --   PHOS 4.5  --   --    GFR: Estimated Creatinine Clearance: 20.2 mL/min (A) (by C-G formula based on SCr of 3.1 mg/dL (H)). Recent Labs  Lab 04/20/24 0300 04/20/24 0307  WBC 3.4*  --   LATICACIDVEN  --  4.8*    Liver Function Tests: Recent Labs  Lab 04/20/24 0300  AST 35  ALT 17  ALKPHOS 89  BILITOT 0.6  PROT 6.3*  ALBUMIN 2.6*   No results for input(s): LIPASE, AMYLASE in the last 168 hours. No results for input(s): AMMONIA in the last 168 hours.  ABG    Component Value Date/Time   PHART 7.383 04/20/2024 0337   PCO2ART 45.8 04/20/2024 0337   PO2ART 228 (H) 04/20/2024 0337   HCO3 27.3 04/20/2024 0337   TCO2 29 04/20/2024 0337   ACIDBASEDEF 4.9 (H) 07/20/2015 1540   O2SAT 100 04/20/2024 0337     Coagulation Profile: Recent Labs  Lab 04/20/24 0300  INR 1.9*    Cardiac Enzymes: No results for input(s): CKTOTAL, CKMB, CKMBINDEX, TROPONINI in the last 168 hours.  HbA1C: Hgb A1c MFr Bld  Date/Time Value Ref Range Status  06/08/2018 12:02 AM 5.2 4.8 - 5.6 % Final    Comment:    (NOTE) Pre diabetes:          5.7%-6.4% Diabetes:              >6.4% Glycemic control for   <7.0% adults with diabetes     CBG: No results for input(s): GLUCAP in the last 168 hours.  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   Surgical History:   Past Surgical History:  Procedure Laterality Date   INSERTION OF DIALYSIS CATHETER Right 03/02/2024   Procedure: INSERTION OF DIALYSIS CATHETER;  Surgeon: Pearline Norman RAMAN,  MD;  Location: [facility] OR;  Service: Vascular;  Laterality: Right;   lymph nodes     biopsy - benign   RADIOLOGY WITH ANESTHESIA N/A 07/17/2015   Procedure: RADIOLOGY WITH ANESTHESIA;  Surgeon: Medication Radiologist, MD;  Location: [facility] OR;  Service: Radiology;  Laterality: N/A;   TRANSURETHRAL RESECTION OF PROSTATE N/A 09/26/2021   Procedure: TRANSURETHRAL RESECTION AND UNROOFING OF PROSTATIC ABSCESS;  Surgeon: Matilda Senior, MD;  Location: [facility] ORS;  Service: Urology;  Laterality: N/A;     Social History:   reports that he has never smoked. He has never used smokeless tobacco. He reports that he does not currently use drugs after  having used the following drugs: Marijuana. He reports that he does not drink alcohol.   Family History:  His family history includes Aneurysm in his mother and sister; Breast cancer in his sister; Clotting disorder in his brother; Heart attack in his father.   Allergies Allergies  Allergen Reactions   Hydrochlorothiazide     precipitates gout   Pseudoephedrine Rash     Home Medications  Prior to Admission medications   Medication Sig Start Date End Date Taking? Authorizing Provider  carvedilol  (COREG ) 12.5 MG tablet Take 12.5 mg by mouth 2 (two) times daily. 04/17/24  Yes [provider]  triamcinolone cream (KENALOG) 0.1 % Apply 1 Application topically 2 (two) times daily. 04/04/24  Yes [provider]  acetaminophen  (TYLENOL ) 325 MG tablet Take 2 tablets (650 mg total) by mouth every 6 (six) hours as needed for mild pain, moderate pain or fever (or Fever >/= 101). Patient taking differently: Take 325-650 mg by mouth every 6 (six) hours as needed for mild pain (pain score 1-3), moderate pain (pain score 4-6) or fever (or Fever >/= 101). 03/09/17   Hongalgi, Anand D, MD  allopurinol  (ZYLOPRIM ) 100 MG tablet Take 100 mg by mouth daily.    [provider]  amLODipine  (NORVASC ) 10 MG tablet Take 1 tablet (10 mg total) by mouth daily.  08/01/18   Medina-Vargas, Monina C, NP  Calcium  Carb-Cholecalciferol  (OYSTER SHELL CALCIUM  W/D) 500-5 MG-MCG TABS Take 1 tablet by mouth 2 (two) times daily. 11/06/23   [provider]  carvedilol  (COREG ) 25 MG tablet Take 1 tablet (25 mg total) by mouth 2 (two) times daily with a meal. 08/01/18   Medina-Vargas, Monina C, NP  cetirizine (ZYRTEC) 10 MG tablet Take 10 mg by mouth 2 (two) times daily.    [provider]  cholecalciferol  (VITAMIN D3) 25 MCG (1000 UNIT) tablet Take 1,000 Units by mouth daily.    [provider]  ferrous sulfate  325 (65 FE) MG tablet Take 1 tablet (325 mg total) by mouth daily with breakfast. 08/01/18   Medina-Vargas, Monina C, NP  folic acid  (FOLVITE ) 1 MG tablet Take 1 tablet (1 mg total) by mouth daily. 08/01/18   Medina-Vargas, Monina C, NP  furosemide  (LASIX ) 20 MG tablet Take 1 tablet (20 mg total) by mouth daily. 09/30/21   Sebastian Toribio GAILS, MD  levETIRAcetam  (KEPPRA  XR) 750 MG 24 hr tablet Take 2 tablets (1,500 mg total) by mouth at bedtime. 08/30/23   Onita Duos, MD  pantoprazole  (PROTONIX ) 40 MG tablet Take 1 tablet (40 mg total) by mouth daily. 08/01/18   Medina-Vargas, Monina C, NP  rivaroxaban  (XARELTO ) 20 MG TABS tablet Take 1 tablet (20 mg total) by mouth daily with supper. 09/28/21   Sebastian Toribio GAILS, MD  sodium bicarbonate  650 MG tablet Take 1,300 mg by mouth 2 (two) times daily. 10/12/22   [provider]     Critical care time: 

## 2024-04-20 NOTE — ED Notes (Addendum)
 Unsuccessful attempt at foley catheter x2.  Bladder scan showed 1ml.

## 2024-04-20 NOTE — ED Triage Notes (Signed)
 Patient arrives via [facility] as cardiac arrest. Patient called ems for shortness of breath and had witnessed arrest by fire at 0201 in PEA rhythm. ROSC at 0214. 1 epi, 50 bicarb, 1g calcium  given, 5mg  versed , and 100mcg fentanyl  given PTA. Intubated with 8.0 ETT. Dialysis patient - no missed sessions. 16g REJ, 25g R tib.

## 2024-04-20 NOTE — Progress Notes (Signed)
 NAME:  Mitchell Maxon., MRN:  [id number], DOB:  06-Apr-1951, LOS: 0 ADMISSION DATE:  04/20/2024, CONSULTATION DATE:  04/20/24 REFERRING MD:  EDP, CHIEF COMPLAINT:  cardiac arrest   History of Present Illness:  73 yo male presented after witnessed cardiac arrest. Pt was reportedly waking up to go to restroom just before 0200. When he was ambulating back from bathroom he became acutely sob and subsequently collapsed. No pulses felt and family began CPR. EMS notified and arrives pt was found to be in PEA, acls begun. Down time 14 mins. Per EMS report pt was combative post ROSC and did req sedation. When he presented to [facility] BP was trending down and levo was initiated. Neurologically, not purposeful but does have cough/gag and spontaneously moves LE.   Per family, pt was in his normal state of health prior to this. Compliant with dialysis sessions, which have been full sessions without decreased time. No recent illness, no fever, chills, cp, cough, dyspnea. No n/v/d   No frank laboratory abnormalities to explain arrest. CTH negative for acute issues. Stat echo pending, may warrant ctpa based on presentation.   Family at bedside provides history in light of pt's intubated status.  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 rate 18, height 5' 8 (1.727 m), weight 76.2 kg, SpO2 98%.    Vent Mode: PRVC FiO2 (%):  [50 %-70 %] 50 % Set Rate:  [18 bmp] 18 bmp Vt Set:  [540 mL] 540 mL PEEP:  [5 cmH20] 5 cmH20 Plateau Pressure:  [16 cmH20-22 cmH20] 22 cmH20    Intake/Output Summary (Last 24 hours) at 04/20/2024 0830 Last data filed at 04/20/2024 0700 Gross per 24 hour  Intake 64.04 ml  Output --  Net 64.04 ml   Filed Weights   04/20/24 0253  Weight: 76.2 kg    Examination: Gen:      No acute distress HEENT:  EOMI, sclera anicteric, ET tube Neck:     No masses; no thyromegaly Lungs:    Clear to auscultation bilaterally; normal respiratory effort CV:         Regular rate and rhythm; no murmurs Abd:      + bowel sounds; soft, non-tender; no palpable masses, no distension Ext:    Contracted hands Neuro: Sedated  Labs/imaging reviewed Significant for potassium 3.2, BUN/creatinine 19/3.10 Lactic acid 4.8, WBC 3.4, hemoglobin 9.0, platelets 179  Chest x-ray with mild asymmetric edema CT head with subacute/chronic infarct  Resolved problem list   Assessment and Plan  Out-of-hospital cardiac arrest Unclear etiology.  Do not suspect PE as he is on Eliquis as an outpatient Concern for anoxic injury Continue supportive care Wean pressors MRI in 72 hours  End-stage renal disease Nephrology consulted for dialysis.   Combined systolic, diastolic heart failure.   EF 20-30% Telemetry monitoring Repeat echocardiogram  History of DVT On Eliquis as outpatient.  Changed to heparin  drip while in ICU  Goals of care Discussed with wife and sister at bedside.  They do not want him to suffer and have agreed to DNR Continue vent  support for now until his neurologic recovery is assessed They are clear that he would not want long-term support.  Best Practice (right click and Reselect all SmartList Selections daily)   Diet/type: NPO w/ meds via tube DVT prophylaxis prophylactic heparin   Pressure ulcer(s): present on admission  GI prophylaxis: H2B Lines: N/A Foley:  N/A Code Status:  full code Last date of multidisciplinary goals of care discussion [ongoing with family]  Critical care time:    The patient is critically ill with  multiple organ system failure and requires high complexity decision making for assessment and support, frequent evaluation and titration of therapies, advanced monitoring, review of radiographic studies and interpretation of complex data.   Critical Care Time devoted to patient care services, exclusive of separately billable procedures, described in this note is 35 minutes.   Angello Chien MD [place] [facility] See Amion for pager  If no response to pager , please call [phone] until 7pm After 7:00 pm call Elink  [phone] 04/20/2024, 2:34 PM

## 2024-04-20 NOTE — Progress Notes (Signed)
 eLink Physician-Brief Progress Note Patient Name: Mitchell [name]. DOB: [birth year] MRN: [id number]   Date of Service  04/20/2024  HPI/Events of Note  Patient with ESRD-DD and severe cardiomyopathy with systolic dysfunction (EF 20 %)  who suffered out of hospital cardiac arrest s/p CPR with ROSC, intubated and ventilated, admitted to ICU for further Rx.  eICU Interventions  New Patient Evaluation.        Mitchell [name] 04/20/2024, 6:31 AM

## 2024-04-21 ENCOUNTER — Inpatient Hospital Stay (HOSPITAL_COMMUNITY)

## 2024-04-21 DIAGNOSIS — I5043 Acute on chronic combined systolic (congestive) and diastolic (congestive) heart failure: Secondary | ICD-10-CM | POA: Diagnosis not present

## 2024-04-21 DIAGNOSIS — I469 Cardiac arrest, cause unspecified: Secondary | ICD-10-CM | POA: Diagnosis not present

## 2024-04-21 DIAGNOSIS — N186 End stage renal disease: Secondary | ICD-10-CM | POA: Diagnosis not present

## 2024-04-21 DIAGNOSIS — Z992 Dependence on renal dialysis: Secondary | ICD-10-CM | POA: Diagnosis not present

## 2024-04-21 LAB — CBC
HCT: 31.1 % — ABNORMAL LOW (ref 39.0–52.0)
Hemoglobin: 10.2 g/dL — ABNORMAL LOW (ref 13.0–17.0)
MCH: 28.7 pg (ref 26.0–34.0)
MCHC: 32.8 g/dL (ref 30.0–36.0)
MCV: 87.6 fL (ref 80.0–100.0)
Platelets: 144 K/uL — ABNORMAL LOW (ref 150–400)
RBC: 3.55 MIL/uL — ABNORMAL LOW (ref 4.22–5.81)
RDW: 16 % — ABNORMAL HIGH (ref 11.5–15.5)
WBC: 6.7 K/uL (ref 4.0–10.5)
nRBC: 0 % (ref 0.0–0.2)

## 2024-04-21 LAB — HEPARIN LEVEL (UNFRACTIONATED)
Heparin Unfractionated: >1.1 [IU]/mL — ABNORMAL HIGH (ref 0.30–0.70)
Heparin Unfractionated: 0.64 [IU]/mL (ref 0.30–0.70)

## 2024-04-21 LAB — GLUCOSE, CAPILLARY
Glucose-Capillary: 103 mg/dL — ABNORMAL HIGH (ref 70–99)
Glucose-Capillary: 78 mg/dL (ref 70–99)
Glucose-Capillary: 80 mg/dL (ref 70–99)
Glucose-Capillary: 91 mg/dL (ref 70–99)
Glucose-Capillary: 92 mg/dL (ref 70–99)
Glucose-Capillary: 96 mg/dL (ref 70–99)

## 2024-04-21 LAB — BASIC METABOLIC PANEL WITH GFR
Anion gap: 14 (ref 5–15)
BUN: 29 mg/dL — ABNORMAL HIGH (ref 8–23)
CO2: 21 mmol/L — ABNORMAL LOW (ref 22–32)
Calcium: 8.5 mg/dL — ABNORMAL LOW (ref 8.9–10.3)
Chloride: 96 mmol/L — ABNORMAL LOW (ref 98–111)
Creatinine, Ser: 4.1 mg/dL — ABNORMAL HIGH (ref 0.61–1.24)
GFR, Estimated: 15 mL/min — ABNORMAL LOW (ref >60)
Glucose, Bld: 89 mg/dL (ref 70–99)
Potassium: 3.6 mmol/L (ref 3.5–5.1)
Sodium: 131 mmol/L — ABNORMAL LOW (ref 135–145)

## 2024-04-21 LAB — MAGNESIUM: Magnesium: 1.8 mg/dL (ref 1.7–2.4)

## 2024-04-21 LAB — APTT
aPTT: 105 s — ABNORMAL HIGH (ref 24–36)
aPTT: 66 s — ABNORMAL HIGH (ref 24–36)

## 2024-04-21 LAB — HEPATITIS B SURFACE ANTIGEN: Hepatitis B Surface Ag: NONREACTIVE

## 2024-04-21 MED ORDER — CHLORHEXIDINE GLUCONATE CLOTH 2 % EX PADS
6.0000 | MEDICATED_PAD | Freq: Every day | CUTANEOUS | Status: DC
  Administered 2024-04-21 – 2024-04-26 (×6): 6 via TOPICAL

## 2024-04-21 NOTE — Progress Notes (Signed)
 PHARMACY - ANTICOAGULATION CONSULT NOTE  Pharmacy Consult for heparin  gtt Indication: DVT  Allergies  Allergen Reactions   Hydrochlorothiazide     precipitates gout   Pseudoephedrine Rash    Patient Measurements: Height: 5' 8 (172.7 cm) Weight: 78.7 kg (173 lb 8 oz) IBW/kg (Calculated) : 68.4 HEPARIN  DW (KG): 76.2  Vital Signs: Temp: 98.4 F (36.9 C) (07/26 0345) Temp Source: Oral (07/26 0345) BP: 142/75 (07/26 0445) Pulse Rate: 59 (07/26 0445)  Labs: Recent Labs    04/20/24 0300 04/20/24 0314 04/20/24 0337 04/20/24 0822 04/20/24 1727 04/21/24 0306  HGB 9.0* 10.5* 9.2* 10.3*  --  10.2*  HCT 29.2* 31.0* 27.0* 32.0*  --  31.1*  PLT 179  --   --  206  --  144*  APTT 48*  --   --   --  77* 105*  LABPROT 22.8*  --   --   --   --   --   INR 1.9*  --   --   --   --   --   HEPARINUNFRC  --   --   --   --  >1.10* >1.10*  CREATININE 3.05* 3.10*  --  3.21*  --  4.10*  TROPONINIHS 31*  --   --  59*  --   --     Estimated Creatinine Clearance: 15.3 mL/min (A) (by C-G formula based on SCr of 4.1 mg/dL (H)).   Assessment: 73 yo M presented s/p cardiac arrest and h/o chronic DVT. On xarelto  prior to admission, last dose unknown. Pharmacy consulted to dose heparin  infusion. Given presence of DOAC prior, expect anti-xa (heparin  levels) to be elevated and will monitor for aPTT until correlating.  Heparin  level > 1.1, supratherapeutic as expected with rivaroxaban  prior to admission APTT 105, slightly supratherapeutic  No issue with bleeding or with the infusion per RN.  Goal of Therapy:  Heparin  level 0.3-0.7 units/ml aPTT 66-102 seconds Monitor platelets by anticoagulation protocol: Yes   Plan:  Decrease heparin  to 1200 units/hr  F/u aPTT until correlates with heparin  level  Monitor daily aPTT, heparin  level, CBC, signs/symptoms of bleeding  F/u restart rivaroxaban    Thank you for allowing pharmacy to participate in this patient's care.  Leonor GORMAN Bash,  PharmD Emergency Medicine Clinical Pharmacist 04/21/2024,5:29 AM

## 2024-04-21 NOTE — Progress Notes (Signed)
 NAME:  Mitchell [name]., MRN:  [id number], DOB:  March 04, 1951, LOS: 1 ADMISSION DATE:  04/20/2024, CONSULTATION DATE:  04/20/24 REFERRING MD:  EDP, CHIEF COMPLAINT:  cardiac arrest   History of Present Illness:  73 yo male presented after witnessed cardiac arrest. Pt was reportedly waking up to go to restroom just before 0200. When he was ambulating back from bathroom he became acutely sob and subsequently collapsed. No pulses felt and family began CPR. EMS notified and arrives pt was found to be in PEA, acls begun. Down time 14 mins. Per EMS report pt was combative post ROSC and did req sedation. When he presented to [facility] BP was trending down and levo was initiated. Neurologically, not purposeful but does have cough/gag and spontaneously moves LE.   Per family, pt was in his normal state of health prior to this. Compliant with dialysis sessions, which have been full sessions without decreased time. No recent illness, no fever, chills, cp, cough, dyspnea. No n/v/d   No frank laboratory abnormalities to explain arrest. CTH negative for acute issues. Stat echo pending, may warrant ctpa based on presentation.   Family at bedside provides history in light of pt's intubated status.  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 Family meeting- Made DNR  Interim History / Subjective:   Remains on 3 mcg of norepinephrine , unchanged mental status on the ventilator  Objective    Blood pressure 134/73, pulse (!) 56, temperature 97.7 F (36.5 C), temperature source Oral, resp. rate 18, height 5' 8 (1.727 m), weight 78.7 kg, SpO2 100%.    Vent Mode: PRVC FiO2 (%):  [40 %] 40 % Set Rate:  [18 bmp] 18 bmp Vt Set:  [540 mL] 540 mL PEEP:  [5 cmH20] 5 cmH20 Plateau  Pressure:  [16 cmH20-22 cmH20] 18 cmH20   Intake/Output Summary (Last 24 hours) at 04/21/2024 0759 Last data filed at 04/21/2024 0700 Gross per 24 hour  Intake 755.25 ml  Output --  Net 755.25 ml   Filed Weights   04/20/24 0253 04/21/24 0455  Weight: 76.2 kg 78.7 kg    Examination: Gen:      No acute distress HEENT:  EOMI, sclera anicteric, ETT Neck:     No masses; no thyromegaly Lungs:    Clear to auscultation bilaterally; normal respiratory effort CV:         Regular rate and rhythm; no murmurs Abd:      + bowel sounds; soft, non-tender; no palpable masses, no distension Ext:    No edema; adequate peripheral perfusion Neuro: Sedated, unresponsive  Labs/imaging reviewed Significant for sodium 131, BUN/creatinine 29/4.10 WBC 6.7, hemoglobin 10.2, platelets 144  Chest x-ray with mild asymmetric edema CT head with subacute/chronic infarct  Resolved problem list   Assessment and Plan  Out-of-hospital cardiac arrest Unclear etiology.  Do not suspect PE as he is on Eliquis  as an outpatient Concern for anoxic injury Continue supportive care Wean pressors Check EEG, MRI in 72 hours  End-stage renal disease Nephrology consulted for dialysis.  Combined systolic, diastolic heart failure.   EF 20-30% Telemetry monitoring Repeat echocardiogram  History of DVT On Eliquis  as outpatient.  Changed to heparin  drip while in ICU  Goals of care Discussed with wife and sister at bedside.  They  do not want him to suffer and have agreed to DNR Continue vent support for now until his neurologic recovery is assessed They are clear that he would not want long-term support.  Best Practice (right click and Reselect all SmartList Selections daily)   Diet/type: NPO w/ meds via tube DVT prophylaxis prophylactic heparin   Pressure ulcer(s): present on admission  GI prophylaxis: H2B Lines: N/A Foley:  N/A Code Status:  full code Last date of multidisciplinary goals of care discussion  [ongoing with family]. Sister updated 7/27  Critical care time:    The patient is critically ill with multiple organ system failure and requires high complexity decision making for assessment and support, frequent evaluation and titration of therapies, advanced monitoring, review of radiographic studies and interpretation of complex data.   Critical Care Time devoted to patient care services, exclusive of separately billable procedures, described in this note is 35 minutes.   Tritia Endo MD [place] [facility] See Amion for pager  If no response to pager , please call [phone] until 7pm After 7:00 pm call Elink  [phone] 04/21/2024, 7:59 AM

## 2024-04-21 NOTE — Progress Notes (Signed)
 PHARMACY - ANTICOAGULATION CONSULT NOTE  Pharmacy Consult for heparin  gtt Indication: DVT  Allergies  Allergen Reactions   Hydrochlorothiazide     precipitates gout   Pseudoephedrine Rash    Patient Measurements: Height: 5' 8 (172.7 cm) Weight: 78.7 kg (173 lb 8 oz) IBW/kg (Calculated) : 68.4 HEPARIN  DW (KG): 76.2  Vital Signs: Temp: 97.8 F (36.6 C) (07/26 1135) Temp Source: Axillary (07/26 1135) BP: 141/67 (07/26 1300) Pulse Rate: 65 (07/26 1300)  Labs: Recent Labs    04/20/24 0300 04/20/24 0314 04/20/24 0337 04/20/24 0822 04/20/24 1727 04/21/24 0306 04/21/24 1334  HGB 9.0* 10.5* 9.2* 10.3*  --  10.2*  --   HCT 29.2* 31.0* 27.0* 32.0*  --  31.1*  --   PLT 179  --   --  206  --  144*  --   APTT 48*  --   --   --  77* 105*  --   LABPROT 22.8*  --   --   --   --   --   --   INR 1.9*  --   --   --   --   --   --   HEPARINUNFRC  --   --   --   --  >1.10* >1.10* 0.64  CREATININE 3.05* 3.10*  --  3.21*  --  4.10*  --   TROPONINIHS 31*  --   --  59*  --   --   --     Estimated Creatinine Clearance: 15.3 mL/min (A) (by C-G formula based on SCr of 4.1 mg/dL (H)).   Assessment: 73 yo M presented s/p cardiac arrest and h/o chronic DVT. On xarelto  prior to admission, last dose unknown. Pharmacy consulted to dose heparin  infusion.   Heparin  level 0.64, therapeutic, now borderline correlating with aPTT.  APTT 66, borderline therapeutic  Goal of Therapy:  Heparin  level 0.3-0.7 units/ml aPTT 66-102 seconds Monitor platelets by anticoagulation protocol: Yes   Plan:  Continue heparin  to 1200 units/hr F/u aPTT until correlates with heparin  level  Monitor daily aPTT, heparin  level, CBC, signs/symptoms of bleeding   Thank you for allowing pharmacy to participate in this patient's care.  Vermell Mccallum, PharmD 04/21/2024,2:12 PM

## 2024-04-21 NOTE — Consult Note (Signed)
 Renal Service Consult Note Mitchell [name], Mitchell [name]  Gram Siedlecki Sr. 04/21/2024 Mitchell JONETTA Fret, MD Requesting Physician: Dr. Theophilus  Reason for Consult: ESRD pt w/ cardiac arrest HPI: The patient is a 73 y.o. year-old w/ PMH as below who presented to ED early yesterday morning 04/20/2024.  He was brought by EMS after witnessed cardiac arrest.  He was given 1 round of epi, bicarb and calcium .  ROSC obtained.  Patient was intubated.  Patient is on dialysis, no missed sessions.  After ROSC, patient was combative and required Versed  and fentanyl .   Blood pressure 140/80, heart rate 70, respiratory rate 18, temp 97.3.  Potassium was 3.2, BUN 19, creatinine 3.1, calcium  9.0, phosphorus 4.5, BNP 2000.  WBC 3K, Hgb 9.2.  Patient has history of combined systolic/diastolic heart failure, ESRD, history of CVA with contractures, HTN, HL. Patient was admitted to ICU.  We are asked to see for dialysis.   Pt seen in ICU. Pt is sedated and not responsive.    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)    Seizures (HCC)    Thrombocytopenia (HCC)    Past Surgical History  Past Surgical History:  Procedure Laterality Date   INSERTION OF DIALYSIS CATHETER Right 03/02/2024   Procedure: INSERTION OF DIALYSIS CATHETER;  Surgeon: Pearline Norman RAMAN, MD;  Location: [facility] OR;  Service: Vascular;  Laterality: Right;   lymph nodes     biopsy - benign   RADIOLOGY WITH ANESTHESIA N/A 07/17/2015   Procedure: RADIOLOGY WITH ANESTHESIA;  Surgeon: Medication Radiologist, MD;  Location: [facility] OR;  Service: Radiology;  Laterality: N/A;   TRANSURETHRAL  RESECTION OF PROSTATE N/A 09/26/2021   Procedure: TRANSURETHRAL RESECTION AND UNROOFING OF PROSTATIC ABSCESS;  Surgeon: Matilda Senior, MD;  Location: [facility] ORS;  Service: Urology;  Laterality: N/A;   Family History  Family History  Problem Relation Age of Onset   Aneurysm Mother    Heart attack Father    Aneurysm Sister    Breast cancer Sister        diagnosed older than 28 years of age   Clotting disorder Brother    Social History  reports that he has never smoked. He has never used smokeless tobacco. He reports that he does not currently use drugs after having used the following drugs: Marijuana. He reports that he does not drink alcohol. Allergies  Allergies  Allergen Reactions   Hydrochlorothiazide     precipitates gout   Pseudoephedrine Rash   Home medications Prior to Admission medications   Medication Sig Start Date End Date Taking? Authorizing Provider  acetaminophen  (TYLENOL ) 325 MG tablet Take 2 tablets (650 mg total) by mouth every 6 (six) hours as needed for mild pain, moderate pain or fever (or Fever >/= 101). Patient taking differently: Take 325-650 mg by mouth every 6 (six) hours as needed for mild pain (pain score 1-3), moderate pain (pain score 4-6) or fever (or Fever >/= 101). 03/09/17  Yes Hongalgi, Anand D, MD  allopurinol  (ZYLOPRIM ) 100 MG tablet Take  100 mg by mouth every Monday, Wednesday, and Friday. Do not take on dialysis days   Yes [provider]  amLODipine  (NORVASC ) 10 MG tablet Take 1 tablet (10 mg total) by mouth daily. 08/01/18  Yes Medina-Vargas, Monina C, NP  Calcium  Carb-Cholecalciferol  (OYSTER SHELL CALCIUM  W/D) 500-5 MG-MCG TABS Take 1 tablet by mouth 2 (two) times daily. 11/06/23  Yes [provider]  carvedilol  (COREG ) 12.5 MG tablet Take 12.5 mg by mouth 2 (two) times daily. 04/17/24  Yes [provider]  cholecalciferol  (VITAMIN D3) 25 MCG (1000 UNIT) tablet Take 1,000 Units by mouth daily.   Yes [provider]   ferrous sulfate  325 (65 FE) MG tablet Take 1 tablet (325 mg total) by mouth daily with breakfast. 08/01/18  Yes Medina-Vargas, Monina C, NP  folic acid  (FOLVITE ) 1 MG tablet Take 1 tablet (1 mg total) by mouth daily. 08/01/18  Yes Medina-Vargas, Monina C, NP  furosemide  (LASIX ) 20 MG tablet Take 1 tablet (20 mg total) by mouth daily. Patient taking differently: Take 60 mg by mouth every Monday, Wednesday, and Friday. Take on days we do not have dialysis 09/30/21  Yes Sebastian Toribio GAILS, MD  levETIRAcetam  (KEPPRA  XR) 750 MG 24 hr tablet Take 2 tablets (1,500 mg total) by mouth at bedtime. 08/30/23  Yes Onita Duos, MD  pantoprazole  (PROTONIX ) 40 MG tablet Take 1 tablet (40 mg total) by mouth daily. 08/01/18  Yes Medina-Vargas, Monina C, NP  rivaroxaban  (XARELTO ) 20 MG TABS tablet Take 1 tablet (20 mg total) by mouth daily with supper. 09/28/21  Yes Sebastian Toribio GAILS, MD  carvedilol  (COREG ) 25 MG tablet Take 1 tablet (25 mg total) by mouth 2 (two) times daily with a meal. Patient not taking: Reported on 04/20/2024 08/01/18   Medina-Vargas, Monina C, NP  cetirizine (ZYRTEC) 10 MG tablet Take 10 mg by mouth 2 (two) times daily.    [provider]  sodium bicarbonate  650 MG tablet Take 1,300 mg by mouth 2 (two) times daily. Patient not taking: Reported on 04/20/2024 10/12/22   [provider]  triamcinolone cream (KENALOG) 0.1 % Apply 1 Application topically 2 (two) times daily. Patient not taking: Reported on 04/20/2024 04/04/24   [provider]     Vitals:   04/21/24 0945 04/21/24 1000 04/21/24 1015 04/21/24 1030  BP: (!) 144/76 138/71 (!) 141/83 139/74  Pulse: 69 63 84 66  Resp: 18 18 18 18   Temp:  (!) 97.3 F (36.3 C)    TempSrc:  Axillary    SpO2: 99% 100% 99% 100%  Weight:      Height:       Exam Gen on vent, sedated No rash, cyanosis or gangrene Sclera anicteric, throat w/ ETT No jvd or bruits Chest clear anterior/ lateral RRR no MRG Abd soft ntnd no mass or  ascites +bs GU normal MS no joint effusions or deformity Ext no LE edema, no wounds or ulcers Neuro is on vent, sedated     TDC intact       Home bp meds: Norvasc  10 Coreg  12.5 bid Lasix  60mg  on non-hd days    OP HD: TTS G-O 4h  B400   74.5kg   3K bath  TDC   Heparin  none Last OP HD 7/24, post wt 74.7kg   Good compliance, WG's 1.5-2.5 kg    Assessment/ Plan: Cardiac arrest: out of [name] arrest, early am 7/25. Is in ICU on vent support per CCM.  ESRD: on HD TTS. Last  HD 7/24. Plan HD today/ tonight.  Hypotension: post arrest shock on levo gtt at 2-3. BP's stable 130/80.  Volume: up 3-4kg by wt, UF goal 2.5 L as tolerated.  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        Myer Fret  MD [facility] 04/21/2024, 11:06 AM  Recent Labs  Lab 04/20/24 0300 04/20/24 0314 04/20/24 0337 04/20/24 0822 04/21/24 0306  HGB 9.0*   < > 9.2* 10.3* 10.2*  ALBUMIN 2.6*  --   --   --   --   CALCIUM  9.0  --   --   --  8.5*  PHOS 4.5  --   --   --   --   CREATININE 3.05*   < >  --  3.21* 4.10*  K 3.3*   < > 3.2*  --  3.6   < > = values in this interval not displayed.   Inpatient medications:  Chlorhexidine  Gluconate Cloth  6 each Topical Daily   Chlorhexidine  Gluconate Cloth  6 each Topical Q0600   famotidine   20 mg Per Tube Daily   insulin  aspart  0-6 Units Subcutaneous Q4H   levETIRAcetam   500 mg Per Tube BID    heparin  1,200 Units/hr (04/21/24 1000)   norepinephrine  (LEVOPHED ) Adult infusion 3 mcg/min (04/21/24 1000)   propofol  (DIPRIVAN ) infusion 45 mcg/kg/min (04/21/24 1000)   albuterol , docusate, fentaNYL  (SUBLIMAZE ) injection, mouth rinse, polyethylene glycol

## 2024-04-21 NOTE — Progress Notes (Signed)
 Muscle tension and tremor noted in jaw and eyes. Called MD Mannam no answer.  50 mcg fentanyl  given and movements ceased.

## 2024-04-21 NOTE — Progress Notes (Signed)
 LTM EEG hooked up and running - no initial skin breakdown - push button tested - [facility] monitoring.

## 2024-04-22 ENCOUNTER — Inpatient Hospital Stay (HOSPITAL_COMMUNITY)

## 2024-04-22 DIAGNOSIS — I469 Cardiac arrest, cause unspecified: Secondary | ICD-10-CM

## 2024-04-22 DIAGNOSIS — I5043 Acute on chronic combined systolic (congestive) and diastolic (congestive) heart failure: Secondary | ICD-10-CM | POA: Diagnosis not present

## 2024-04-22 DIAGNOSIS — N186 End stage renal disease: Secondary | ICD-10-CM | POA: Diagnosis not present

## 2024-04-22 DIAGNOSIS — Z992 Dependence on renal dialysis: Secondary | ICD-10-CM | POA: Diagnosis not present

## 2024-04-22 LAB — GLUCOSE, CAPILLARY
Glucose-Capillary: 100 mg/dL — ABNORMAL HIGH (ref 70–99)
Glucose-Capillary: 104 mg/dL — ABNORMAL HIGH (ref 70–99)
Glucose-Capillary: 106 mg/dL — ABNORMAL HIGH (ref 70–99)
Glucose-Capillary: 108 mg/dL — ABNORMAL HIGH (ref 70–99)
Glucose-Capillary: 91 mg/dL (ref 70–99)
Glucose-Capillary: 98 mg/dL (ref 70–99)

## 2024-04-22 LAB — CBC
HCT: 31.3 % — ABNORMAL LOW (ref 39.0–52.0)
Hemoglobin: 10.2 g/dL — ABNORMAL LOW (ref 13.0–17.0)
MCH: 28.2 pg (ref 26.0–34.0)
MCHC: 32.6 g/dL (ref 30.0–36.0)
MCV: 86.5 fL (ref 80.0–100.0)
Platelets: 188 K/uL (ref 150–400)
RBC: 3.62 MIL/uL — ABNORMAL LOW (ref 4.22–5.81)
RDW: 16.2 % — ABNORMAL HIGH (ref 11.5–15.5)
WBC: 10.2 K/uL (ref 4.0–10.5)
nRBC: 0 % (ref 0.0–0.2)

## 2024-04-22 LAB — BASIC METABOLIC PANEL WITH GFR
Anion gap: 15 (ref 5–15)
BUN: 20 mg/dL (ref 8–23)
CO2: 22 mmol/L (ref 22–32)
Calcium: 8.2 mg/dL — ABNORMAL LOW (ref 8.9–10.3)
Chloride: 96 mmol/L — ABNORMAL LOW (ref 98–111)
Creatinine, Ser: 3.59 mg/dL — ABNORMAL HIGH (ref 0.61–1.24)
GFR, Estimated: 17 mL/min — ABNORMAL LOW (ref >60)
Glucose, Bld: 85 mg/dL (ref 70–99)
Potassium: 3.6 mmol/L (ref 3.5–5.1)
Sodium: 133 mmol/L — ABNORMAL LOW (ref 135–145)

## 2024-04-22 LAB — APTT: aPTT: 78 s — ABNORMAL HIGH (ref 24–36)

## 2024-04-22 LAB — HEPARIN LEVEL (UNFRACTIONATED): Heparin Unfractionated: 0.3 [IU]/mL (ref 0.30–0.70)

## 2024-04-22 LAB — PHOSPHORUS: Phosphorus: 2.9 mg/dL (ref 2.5–4.6)

## 2024-04-22 LAB — MAGNESIUM: Magnesium: 1.7 mg/dL (ref 1.7–2.4)

## 2024-04-22 MED ORDER — HEPARIN SODIUM (PORCINE) 1000 UNIT/ML IJ SOLN
INTRAMUSCULAR | Status: AC
  Filled 2024-04-22: qty 4

## 2024-04-22 NOTE — Progress Notes (Signed)
 NAME:  Mitchell [name]., MRN:  [id number], DOB:  December 08, 1950, LOS: 2 ADMISSION DATE:  04/20/2024, CONSULTATION DATE:  04/20/24 REFERRING MD:  EDP, CHIEF COMPLAINT:  cardiac arrest   History of Present Illness:  73 yo male presented after witnessed cardiac arrest. Pt was reportedly waking up to go to restroom just before 0200. When he was ambulating back from bathroom he became acutely sob and subsequently collapsed. No pulses felt and family began CPR. EMS notified and arrives pt was found to be in PEA, acls begun. Down time 14 mins. Per EMS report pt was combative post ROSC and did req sedation. When he presented to [facility] BP was trending down and levo was initiated. Neurologically, not purposeful but does have cough/gag and spontaneously moves LE.   Per family, pt was in his normal state of health prior to this. Compliant with dialysis sessions, which have been full sessions without decreased time. No recent illness, no fever, chills, cp, cough, dyspnea. No n/v/d   No frank laboratory abnormalities to explain arrest. CTH negative for acute issues. Stat echo pending, may warrant ctpa based on presentation.   Family at bedside provides history in light of pt's intubated status.  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 Family meeting- Made DNR  Interim History / Subjective:   Remains on 3 mcg of norepinephrine , unchanged mental status on the ventilator  Objective    Blood pressure 100/65, pulse 63, temperature 100 F (37.8 C), temperature source Axillary, resp. rate 18, height 5' 8 (1.727 m), weight 73.7 kg, SpO2 100%.    Vent Mode: PRVC FiO2 (%):  [40 %] 40 % Set Rate:  [18 bmp] 18 bmp Vt Set:  [540 mL] 540 mL PEEP:  [5 cmH20] 5 cmH20 Plateau  Pressure:  [15 cmH20-19 cmH20] 16 cmH20   Intake/Output Summary (Last 24 hours) at 04/22/2024 0831 Last data filed at 04/22/2024 0700 Gross per 24 hour  Intake 314.97 ml  Output 2500 ml  Net -2185.03 ml   Filed Weights   04/20/24 0253 04/21/24 0455 04/22/24 0500  Weight: 76.2 kg 78.7 kg 73.7 kg    Examination: Gen:      No acute distress HEENT:  EOMI, sclera anicteric, ETT Neck:     No masses; no thyromegaly Lungs:    Clear to auscultation bilaterally; normal respiratory effort CV:         Regular rate and rhythm; no murmurs Abd:      + bowel sounds; soft, non-tender; no palpable masses, no distension Ext:    No edema; adequate peripheral perfusion Neuro: Unresponsive. Pupils minimally reactive  Labs/imaging reviewed Significant for sodium 131, BUN/creatinine 29/4.10 WBC 6.7, hemoglobin 10.2, platelets 144  Chest x-ray with mild asymmetric edema CT head with subacute/chronic infarct  Resolved problem list   Assessment and Plan  Out-of-hospital cardiac arrest Unclear etiology.  Do not suspect PE as he is on Eliquis  as an outpatient Concern for anoxic injury. Plan for MRI tomorrow Continue supportive care Wean pressors  End-stage renal disease Nephrology consulted for dialysis.  Combined systolic, diastolic heart failure.   EF 25-30% is chronic Telemetry monitoring Will hold off cardiology consult until his neuro status and goals of care are clarified  History of DVT On Eliquis  as outpatient.  Changed  to heparin  drip while in ICU  Goals of care Discussed with wife and sister at bedside.  They do not want him to suffer and have agreed to DNR Continue vent support for now until his neurologic recovery is assessed They are clear that he would not want long-term support.  Best Practice (right click and Reselect all SmartList Selections daily)   Diet/type: tubefeeds DVT prophylaxis systemic heparin  Pressure ulcer(s): present on admission  GI prophylaxis:  H2B Lines: Dialysis Catheter Foley:  N/A Code Status:  full code Last date of multidisciplinary goals of care discussion [ongoing with family]. Wife updated 7/27  Critical care time:    The patient is critically ill with multiple organ system failure and requires high complexity decision making for assessment and support, frequent evaluation and titration of therapies, advanced monitoring, review of radiographic studies and interpretation of complex data.   Critical Care Time devoted to patient care services, exclusive of separately billable procedures, described in this note is 35 minutes.   Liona Wengert MD [facility] See Amion for pager  If no response to pager , please call [phone] until 7pm After 7:00 pm call Elink  [phone] 04/22/2024, 8:31 AM

## 2024-04-22 NOTE — Procedures (Signed)
 Patient Name: Mitchell Peretz Sr.  MRN: [id number]  Epilepsy Attending: Pastor Falling  Referring Physician/Provider:  Date: 04/22/2024 Start time: 7/26 at 0921 End Time: 7/27 at 0730  Patient history: 73 year old man, post cardiac arrest, down for 14 mins. EEG to evaluate for seizure  Level of alertness: lethargic   AEDs during EEG study: LEV,Prop  Technical aspects: This EEG study was done with scalp electrodes positioned according to the 10-20 International system of electrode placement. Electrical activity was reviewed with band pass filter of 1-70Hz , sensitivity of 7 uV/mm, display speed of 68mm/sec with a 60Hz  notched filter applied as appropriate. EEG data were recorded continuously and digitally stored.  Video monitoring was available and reviewed as appropriate.  Description: EEG showed continuous generalized polymorphic sharply contoured 3 to 6 Hz theta-delta slowing. There were a couple events describes as tremors of the jaws and eyes. Concomitant EEG before, during and after the event did not show any EEG changes suggest seizure. Hyperventilation and photic stimulation were not performed.     ABNORMALITY - Continuous slow, generalized   IMPRESSION: This study is suggestive of mild to moderate diffuse encephalopathy, nonspecific etiology but related to sedation, toxic-metabolic etiology, anoxic/hypoxic brain injury. No seizures or epileptiform discharges were seen throughout the recording.   Pastor Falling MD Neurology

## 2024-04-22 NOTE — Progress Notes (Addendum)
 PHARMACY - ANTICOAGULATION CONSULT NOTE  Pharmacy Consult for heparin  gtt Indication: DVT  Allergies  Allergen Reactions   Hydrochlorothiazide     precipitates gout   Pseudoephedrine Rash    Patient Measurements: Height: 5' 8 (172.7 cm) Weight: 73.7 kg (162 lb 7.7 oz) IBW/kg (Calculated) : 68.4 HEPARIN  DW (KG): 76.2  Vital Signs: Temp: 100 F (37.8 C) (07/27 0335) Temp Source: Axillary (07/27 0335) BP: 111/66 (07/27 1015) Pulse Rate: 70 (07/27 1015)  Labs: Recent Labs    04/20/24 0300 04/20/24 0314 04/20/24 0822 04/20/24 1727 04/21/24 0306 04/21/24 1334 04/22/24 0922 04/22/24 0928  HGB 9.0*   < > 10.3*  --  10.2*  --  10.2*  --   HCT 29.2*   < > 32.0*  --  31.1*  --  31.3*  --   PLT 179  --  206  --  144*  --  188  --   APTT 48*  --   --    < > 105* 66*  --  78*  LABPROT 22.8*  --   --   --   --   --   --   --   INR 1.9*  --   --   --   --   --   --   --   HEPARINUNFRC  --   --   --    < > >1.10* 0.64  --  0.30  CREATININE 3.05*   < > 3.21*  --  4.10*  --  3.59*  --   TROPONINIHS 31*  --  59*  --   --   --   --   --    < > = values in this interval not displayed.    Estimated Creatinine Clearance: 17.5 mL/min (A) (by C-G formula based on SCr of 3.59 mg/dL (H)).   Assessment: 73 yo M presented s/p cardiac arrest and h/o chronic DVT. On xarelto  prior to admission, last dose unknown. Pharmacy consulted to dose heparin  infusion.   Heparin  level 0.3, borderline therapeutic, now correlating with aPTT.  APTT 78, therapeutic.  No overt bleeding concerns or infusion issues per RN.   Goal of Therapy:  Heparin  level 0.3-0.7 units/ml aPTT 66-102 seconds Monitor platelets by anticoagulation protocol: Yes   Plan:  Increase heparin  to 1300 units/hr Daily heparin  levels, CBC, signs/symptoms of bleeding   Thank you for allowing pharmacy to participate in this patient's care.  Vermell Mccallum, PharmD 04/22/2024,10:41 AM

## 2024-04-22 NOTE — Progress Notes (Signed)
 [facility] Progress Note   Subjective:   Patient seen in his room. He had HD overnight and they were able to UF 2.2L off of him. BP still remains low and he is on low dose NE gtt. Currently on minimal ventilator support. He is on diprivan  for sedation. EEG monitoring in place. Patient is currently DNR and family would like to continue until his neurologic recovery is assessed.   Objective Vitals:   04/22/24 1145 04/22/24 1200 04/22/24 1215 04/22/24 1218  BP: 104/66 134/64    Pulse: 80 75 65   Resp: 20 18 18    Temp:    100 F (37.8 C)  TempSrc:    Axillary  SpO2: 100% 99% 97%   Weight:      Height:       Exam Gen on vent, sedated on diprivan  No rash, cyanosis or gangrene Sclera anicteric, throat w/ ETT No jvd or bruits Chest clear anterior/ lateral - minimal ventilator support RRR no MRG Abd soft ntnd no mass or ascites +bs GU normal MS no joint effusions or deformity Ext no LE edema, no wounds or ulcers Neuro is on vent, sedated TDC intact  Additional Objective Labs: Basic Metabolic Panel: Recent Labs  Lab 04/20/24 0300 04/20/24 0314 04/20/24 0337 04/20/24 0822 04/21/24 0306 04/22/24 0922  NA 134* 135 135  --  131* 133*  K 3.3* 3.2* 3.2*  --  3.6 3.6  CL 98 97*  --   --  96* 96*  CO2 24  --   --   --  21* 22  GLUCOSE 149* 151*  --   --  89 85  BUN 19 19  --   --  29* 20  CREATININE 3.05* 3.10*  --  3.21* 4.10* 3.59*  CALCIUM  9.0  --   --   --  8.5* 8.2*  PHOS 4.5  --   --   --   --  2.9   Liver Function Tests: Recent Labs  Lab 04/20/24 0300  AST 35  ALT 17  ALKPHOS 89  BILITOT 0.6  PROT 6.3*  ALBUMIN 2.6*   No results for input(s): LIPASE, AMYLASE in the last 168 hours. CBC: Recent Labs  Lab 04/20/24 0300 04/20/24 0314 04/20/24 0822 04/21/24 0306 04/22/24 0922  WBC 3.4*  --  6.7 6.7 10.2  HGB 9.0*   < > 10.3* 10.2* 10.2*  HCT 29.2*   < > 32.0* 31.1* 31.3*  MCV 91.5  --  88.4 87.6 86.5  PLT 179  --  206 144* 188   < >  = values in this interval not displayed.    CBG: Recent Labs  Lab 04/21/24 1934 04/21/24 2348 04/22/24 0334 04/22/24 0906 04/22/24 1216  GLUCAP 80 91 91 100* 106*     Medications:  heparin  1,300 Units/hr (04/22/24 1217)   norepinephrine  (LEVOPHED ) Adult infusion 4 mcg/min (04/22/24 1000)   propofol  (DIPRIVAN ) infusion 30 mcg/kg/min (04/22/24 1136)    Chlorhexidine  Gluconate Cloth  6 each Topical Daily   Chlorhexidine  Gluconate Cloth  6 each Topical Q0600   famotidine   20 mg Per Tube Daily   insulin  aspart  0-6 Units Subcutaneous Q4H   levETIRAcetam   500 mg Per Tube BID   Home bp meds: Norvasc  10 Coreg  12.5 bid Lasix  60mg  on non-hd days      OP HD: TTS G-O 4h  B400   74.5kg   3K bath  TDC   Heparin  none Last OP HD 7/24, post wt  74.7kg   Good compliance, WG's 1.5-2.5 kg     Assessment/ Plan: Cardiac arrest: out of hospital arrest, early am 7/25. Is in ICU on vent support per CCM. On continuous EEG monitoring. Show mild to moderated diffuse encephalopathy. Concern for anoxic injury. Checking EEG and MRI in 72 hours.  ESRD: on HD TTS. Last HD 7/27 with 2.2L UF.  Combined systolic and diastolic HF: last EF 20-30%. Hx of DVT: on Eliquis  as outpatient. He is on heparin  gtt while in hospital.  Hypotension: post arrest shock on levo gtt at 2-3. BP's stable 134/64  Volume: up 3-4kg by wt, UF goal 2.5 L as tolerated. Below EDW currently. Last weight 73.7 kg.  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 Hgb 10.2 today  Belvie Och, NP 04/22/2024, 12:36 PM  [facility]

## 2024-04-22 NOTE — Progress Notes (Signed)
LTM maint complete - no skin breakdown under:  Fp1, Fp2

## 2024-04-22 NOTE — Plan of Care (Signed)
  Problem: Fluid Volume: Goal: Ability to maintain a balanced intake and output will improve Outcome: Progressing   Problem: Metabolic: Goal: Ability to maintain appropriate glucose levels will improve Outcome: Progressing   Problem: Skin Integrity: Goal: Risk for impaired skin integrity will decrease Outcome: Progressing   Problem: Tissue Perfusion: Goal: Adequacy of tissue perfusion will improve Outcome: Progressing   Problem: Respiratory: Goal: Ability to maintain a clear airway and adequate ventilation will improve Outcome: Progressing   Problem: Clinical Measurements: Goal: Will remain free from infection Outcome: Progressing

## 2024-04-23 ENCOUNTER — Inpatient Hospital Stay (HOSPITAL_COMMUNITY)

## 2024-04-23 DIAGNOSIS — N186 End stage renal disease: Secondary | ICD-10-CM | POA: Diagnosis not present

## 2024-04-23 DIAGNOSIS — Z992 Dependence on renal dialysis: Secondary | ICD-10-CM | POA: Diagnosis not present

## 2024-04-23 DIAGNOSIS — G9341 Metabolic encephalopathy: Secondary | ICD-10-CM | POA: Diagnosis not present

## 2024-04-23 DIAGNOSIS — E43 Unspecified severe protein-calorie malnutrition: Secondary | ICD-10-CM | POA: Insufficient documentation

## 2024-04-23 DIAGNOSIS — I469 Cardiac arrest, cause unspecified: Secondary | ICD-10-CM | POA: Diagnosis not present

## 2024-04-23 LAB — BASIC METABOLIC PANEL WITH GFR
Anion gap: 15 (ref 5–15)
BUN: 27 mg/dL — ABNORMAL HIGH (ref 8–23)
CO2: 22 mmol/L (ref 22–32)
Calcium: 8 mg/dL — ABNORMAL LOW (ref 8.9–10.3)
Chloride: 96 mmol/L — ABNORMAL LOW (ref 98–111)
Creatinine, Ser: 5.19 mg/dL — ABNORMAL HIGH (ref 0.61–1.24)
GFR, Estimated: 11 mL/min — ABNORMAL LOW (ref >60)
Glucose, Bld: 95 mg/dL (ref 70–99)
Potassium: 3 mmol/L — ABNORMAL LOW (ref 3.5–5.1)
Sodium: 133 mmol/L — ABNORMAL LOW (ref 135–145)

## 2024-04-23 LAB — CBC
HCT: 27.1 % — ABNORMAL LOW (ref 39.0–52.0)
Hemoglobin: 8.9 g/dL — ABNORMAL LOW (ref 13.0–17.0)
MCH: 28 pg (ref 26.0–34.0)
MCHC: 32.8 g/dL (ref 30.0–36.0)
MCV: 85.2 fL (ref 80.0–100.0)
Platelets: 144 K/uL — ABNORMAL LOW (ref 150–400)
RBC: 3.18 MIL/uL — ABNORMAL LOW (ref 4.22–5.81)
RDW: 15.8 % — ABNORMAL HIGH (ref 11.5–15.5)
WBC: 8.8 K/uL (ref 4.0–10.5)
nRBC: 0 % (ref 0.0–0.2)

## 2024-04-23 LAB — GLUCOSE, CAPILLARY
Glucose-Capillary: 106 mg/dL — ABNORMAL HIGH (ref 70–99)
Glucose-Capillary: 119 mg/dL — ABNORMAL HIGH (ref 70–99)
Glucose-Capillary: 88 mg/dL (ref 70–99)
Glucose-Capillary: 89 mg/dL (ref 70–99)
Glucose-Capillary: 93 mg/dL (ref 70–99)
Glucose-Capillary: 95 mg/dL (ref 70–99)

## 2024-04-23 LAB — HEPARIN LEVEL (UNFRACTIONATED)
Heparin Unfractionated: 0.22 [IU]/mL — ABNORMAL LOW (ref 0.30–0.70)
Heparin Unfractionated: 0.24 [IU]/mL — ABNORMAL LOW (ref 0.30–0.70)
Heparin Unfractionated: 0.24 [IU]/mL — ABNORMAL LOW (ref 0.30–0.70)

## 2024-04-23 MED ORDER — ORAL CARE MOUTH RINSE: 15.0000 mL | OROMUCOSAL | Status: DC | PRN

## 2024-04-23 MED ORDER — ORAL CARE MOUTH RINSE
15.0000 mL | OROMUCOSAL | Status: DC
  Administered 2024-04-23 – 2024-04-28 (×56): 15 mL via OROMUCOSAL

## 2024-04-23 MED ORDER — GADOBUTROL 1 MMOL/ML IV SOLN
7.0000 mL | Freq: Once | INTRAVENOUS | Status: AC | PRN
  Administered 2024-04-23: 7 mL via INTRAVENOUS

## 2024-04-23 MED ORDER — PROSOURCE TF20 ENFIT COMPATIBL EN LIQD
60.0000 mL | Freq: Every day | ENTERAL | Status: DC
  Administered 2024-04-23 – 2024-04-27 (×5): 60 mL
  Filled 2024-04-23 (×5): qty 60

## 2024-04-23 MED ORDER — POTASSIUM CHLORIDE 20 MEQ PO PACK
20.0000 meq | PACK | Freq: Once | ORAL | Status: AC
  Administered 2024-04-23: 20 meq
  Filled 2024-04-23: qty 1

## 2024-04-23 MED ORDER — HEPARIN BOLUS VIA INFUSION
1150.0000 [IU] | Freq: Once | INTRAVENOUS | Status: AC
  Administered 2024-04-23: 1150 [IU] via INTRAVENOUS
  Filled 2024-04-23: qty 1150

## 2024-04-23 MED ORDER — OSMOLITE 1.5 CAL PO LIQD
1000.0000 mL | ORAL | Status: DC
  Administered 2024-04-23 – 2024-04-26 (×4): 1000 mL
  Filled 2024-04-23 (×7): qty 1000

## 2024-04-23 MED ORDER — THIAMINE MONONITRATE 100 MG PO TABS
100.0000 mg | ORAL_TABLET | Freq: Every day | ORAL | Status: AC
  Administered 2024-04-23 – 2024-04-27 (×5): 100 mg
  Filled 2024-04-23 (×5): qty 1

## 2024-04-23 MED ORDER — RENA-VITE PO TABS
1.0000 | ORAL_TABLET | Freq: Every day | ORAL | Status: DC
  Administered 2024-04-23 – 2024-04-26 (×4): 1
  Filled 2024-04-23 (×4): qty 1

## 2024-04-23 NOTE — Progress Notes (Signed)
 RT assisted with patient transport to MRI and returned to 2M12 without complications.

## 2024-04-23 NOTE — Progress Notes (Signed)
 Initial Nutrition Assessment  DOCUMENTATION CODES:  Severe malnutrition in context of chronic illness  INTERVENTION:  Initiate tube feeding via OGT: Osmolite 1.5 at 60 ml/h (1440 ml per day) Start at 20 and advance by 10mL every 8 hours to reach goal Prosource TF20 60 ml 1x/d Provides 2240 kcal, 110 gm protein, 1097 ml free water  daily Renal MVI daily Pt is at risk for refeeding syndrome given severe malnutrition. Monitor magnesium  and phosphorus daily x 2 days, MD to replete as needed. 100mg  thiamine  x 5 days   NUTRITION DIAGNOSIS:  Severe Malnutrition related to chronic illness (ESRD on HD) as evidenced by severe muscle depletion, severe fat depletion.  GOAL:  Patient will meet greater than or equal to 90% of their needs  MONITOR:  TF tolerance, I & O's, Vent status, Labs  REASON FOR ASSESSMENT:  Ventilator    ASSESSMENT:  Pt with hx of alcohol abuse, gout, HTN, HLD, hx CVA, ESRD on HD, and hx MI presented to ED s/p PEA arrest.  7/25 - presented to ED s/p PEA arrest, intubated   Patient is currently intubated on ventilator support . No family present to provide a nutrition hx. Pt discussed during ICU rounds and with RN and MD. Pt to undergo MRI today to further assess mental status. Family has reported pt would not want long-term artifical support but would like neurologic status assessed prior to making further decisions.   Pt has not received nutrition since admission, will initiate today. On exam, pt with significant muscle and fat wasting throughout body. No significant recent weight changes noted, likely more of long-term loss and not meeting increased needs with HD. Will initiate and advance slowly as pt will be at risk for refeeding. Also added renal MVI for micronutrient losses with HD.  MV: 9.8 L/min Temp (24hrs), Avg:100 F (37.8 C), Min:99.6 F (37.6 C), Max:100.8 F (38.2 C) MAP (cuff):  Propofol : 4.57 ml/hr  Admit weight: 76.2 kg   Current weight:  73 kg  EDW: 74.7 kg    Intake/Output Summary (Last 24 hours) at 04/23/2024 1242 Last data filed at 04/23/2024 1100 Gross per 24 hour  Intake 470.24 ml  Output 100 ml  Net 370.24 ml  Net IO Since Admission: -321.07 mL [04/23/24 1242]  Drains/Lines: Right subclavian, tunneled HD catheter OGT 16 Fr.  UOP x 24 hours  Nutritionally Relevant Medications: Scheduled Meds:  famotidine   20 mg Per Tube Daily   insulin  aspart  0-6 Units Subcutaneous Q4H   levETIRAcetam   500 mg Per Tube BID   potassium chloride   20 mEq Per Tube Once   Continuous Infusions:  propofol  (DIPRIVAN ) infusion 10 mcg/kg/min (04/23/24 0700)   PRN Meds: docusate, polyethylene glycol  Labs Reviewed: Sodium 133, chloride 96 Potassium 3.0 BUN 27, creatinine 5.19 CBG ranges from 91-108 mg/dL over the last 24 hours HgbA1c 3.7%  NUTRITION - FOCUSED PHYSICAL EXAM: Flowsheet Row Most Recent Value  Orbital Region Severe depletion  Upper Arm Region Moderate depletion  Thoracic and Lumbar Region Mild depletion  Buccal Region Severe depletion  Temple Region Moderate depletion  Clavicle Bone Region Severe depletion  Clavicle and Acromion Bone Region Severe depletion  Scapular Bone Region Severe depletion  Dorsal Hand Unable to assess  [mittens]  Patellar Region Moderate depletion  Anterior Thigh Region Severe depletion  Posterior Calf Region Severe depletion  Edema (RD Assessment) None  Hair Reviewed  Eyes Reviewed  Mouth Reviewed  [limited dentition]  Skin Reviewed  Nails Reviewed  Diet Order:   Diet Order             Diet NPO time specified  Diet effective now                   EDUCATION NEEDS:  Not appropriate for education at this time  Skin:  Skin Assessment: Reviewed RN Assessment Skin tear - medial vertebral column (5 x 3 cm)  Last BM:  unsure  Height:  Ht Readings from Last 1 Encounters:  04/23/24 5' 8 (1.727 m)    Weight:  Wt Readings from Last 1 Encounters:  04/23/24  73 kg    Ideal Body Weight:  70 kg  BMI:  Body mass index is 24.47 kg/m.  Estimated Nutritional Needs:  Kcal:  2000-2300 kcal/d Protein:  100-115 g/d Fluid:  1L + UOP    Vernell Lukes, RD, LDN, CNSC Registered Dietitian II Please reach out via secure chat

## 2024-04-23 NOTE — Progress Notes (Signed)
LTM EEG disconnected - no skin breakdown at unhook. [facility] notified.  

## 2024-04-23 NOTE — Progress Notes (Signed)
  [facility] Progress Note   Subjective:   No norepi. No significant changes.  Objective Vitals:   04/23/24 0700 04/23/24 0724 04/23/24 0753 04/23/24 0800  BP: 135/67 (!) 140/50  118/60  Pulse: 85 86  73  Resp: 20 18  18   Temp:   99.7 F (37.6 C)   TempSrc:   Axillary   SpO2: 100% 100%  100%  Weight:      Height:       Exam GEN: lying in bed, nad, sedated ENT: no nasal discharge, mmm EYES: no scleral icterus CV: normal rate, no rub PULM: Bilateral breath sounds, ventilated, bilateral chest rise ABD: NABS, non-distended SKIN: no rashes or jaundice EXT: no edema, warm and well perfused     Additional Objective Labs: Basic Metabolic Panel: Recent Labs  Lab 04/20/24 0300 04/20/24 0314 04/21/24 0306 04/22/24 0922 04/23/24 0239  NA 134*   < > 131* 133* 133*  K 3.3*   < > 3.6 3.6 3.0*  CL 98   < > 96* 96* 96*  CO2 24  --  21* 22 22  GLUCOSE 149*   < > 89 85 95  BUN 19   < > 29* 20 27*  CREATININE 3.05*   < > 4.10* 3.59* 5.19*  CALCIUM  9.0  --  8.5* 8.2* 8.0*  PHOS 4.5  --   --  2.9  --    < > = values in this interval not displayed.   Liver Function Tests: Recent Labs  Lab 04/20/24 0300  AST 35  ALT 17  ALKPHOS 89  BILITOT 0.6  PROT 6.3*  ALBUMIN 2.6*   No results for input(s): LIPASE, AMYLASE in the last 168 hours. CBC: Recent Labs  Lab 04/20/24 0300 04/20/24 0314 04/20/24 0822 04/21/24 0306 04/22/24 0922 04/23/24 0239  WBC 3.4*  --  6.7 6.7 10.2 8.8  HGB 9.0*   < > 10.3* 10.2* 10.2* 8.9*  HCT 29.2*   < > 32.0* 31.1* 31.3* 27.1*  MCV 91.5  --  88.4 87.6 86.5 85.2  PLT 179  --  206 144* 188 144*   < > = values in this interval not displayed.    CBG: Recent Labs  Lab 04/22/24 1620 04/22/24 1950 04/22/24 2347 04/23/24 0335 04/23/24 0751  GLUCAP 108* 98 104* 95 93     Medications:  heparin  1,400 Units/hr (04/23/24 0700)   propofol  (DIPRIVAN ) infusion 10 mcg/kg/min (04/23/24 0700)    Chlorhexidine  Gluconate  Cloth  6 each Topical Daily   Chlorhexidine  Gluconate Cloth  6 each Topical Q0600   famotidine   20 mg Per Tube Daily   insulin  aspart  0-6 Units Subcutaneous Q4H   levETIRAcetam   500 mg Per Tube BID   mouth rinse  15 mL Mouth Rinse Q2H   potassium chloride   20 mEq Per Tube Once   Home bp meds: Norvasc  10 Coreg  12.5 bid Lasix  60mg  on non-hd days      OP HD: TTS G-O 4h  B400   74.5kg   3K bath  TDC   Heparin  none Last OP HD 7/24, post wt 74.7kg   Good compliance, WG's 1.5-2.5 kg     Assessment/ Plan: Cardiac arrest: out of hospital arrest, early am 7/25. Is in ICU on vent support per CCM. Concern for anoxic injury.  DNR but awaiting possible neurological recovery ESRD: on HD TTS.  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 He  is on heparin  gtt while in hospital.  Hypotension: Had required pressors.  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  Consider ESA or iron if needed  04/23/2024, 9:19 AM  [employer]

## 2024-04-23 NOTE — Progress Notes (Signed)
 Heart Failure Navigator Progress Note  Assessed for Heart & Vascular TOC clinic readiness.  Patient does not meet criteria due to ESRD on hemodialysis. No HF TOC.   Navigator will sign off at this time.   Randie Bustle, BSN, Scientist, clinical (histocompatibility and immunogenetics) Only

## 2024-04-23 NOTE — Progress Notes (Addendum)
 NAME:  Arnel Wymer., MRN:  [id number], DOB:  [birth year], LOS: 3 ADMISSION DATE:  04/20/2024, CONSULTATION DATE:  04/20/24 REFERRING MD:  EDP, CHIEF COMPLAINT:  cardiac arrest   History of Present Illness:  73 yo male presented after witnessed cardiac arrest. Pt was reportedly waking up to go to restroom just before 0200. When he was ambulating back from bathroom he became acutely sob and subsequently collapsed. No pulses felt and family began CPR. EMS notified and arrives pt was found to be in PEA, acls begun. Down time 14 mins. Per EMS report pt was combative post ROSC and did req sedation. When he presented to [facility] BP was trending down and levo was initiated. Neurologically, not purposeful but does have cough/gag and spontaneously moves LE.   Per family, pt was in his normal state of health prior to this. Compliant with dialysis sessions, which have been full sessions without decreased time. No recent illness, no fever, chills, cp, cough, dyspnea. No n/v/d   No frank laboratory abnormalities to explain arrest. CTH negative for acute issues. Stat echo pending, may warrant ctpa based on presentation.   Family at bedside provides history in light of pt's intubated status.  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 Family meeting- Made DNR  Interim History / Subjective:   Off pressors.  He is repeatedly following commands opening eyes wiggling toes, further nurse raise the right arm.  Objective    Blood pressure 118/60, pulse 73, temperature 99.7 F (37.6 C), temperature source Axillary, resp. rate 18, height 5' 8 (1.727 m), weight 73 kg, SpO2 100%.    Vent Mode: PRVC FiO2 (%):  [40 %] 40 % Set Rate:  [18 bmp] 18 bmp Vt Set:  [540 mL] 540 mL PEEP:   [5 cmH20] 5 cmH20 Plateau Pressure:  [16 cmH20-18 cmH20] 16 cmH20   Intake/Output Summary (Last 24 hours) at 04/23/2024 0931 Last data filed at 04/23/2024 0700 Gross per 24 hour  Intake 509.6 ml  Output 270 ml  Net 239.6 ml   Filed Weights   04/21/24 0455 04/22/24 0500 04/23/24 0428  Weight: 78.7 kg 73.7 kg 73 kg    Examination: Gen:      No acute distress HEENT:  EOMI, sclera anicteric, ETT Neck:     No masses; no thyromegaly Lungs:    Clear to auscultation bilaterally; normal respiratory effort CV:         Regular rate and rhythm; no murmurs Abd:      + bowel sounds; soft, non-tender; no palpable masses, no distension Ext:    No edema; adequate peripheral perfusion Neuro: Arouses and follows commands with eyrs and b/l feet. Pupils minimally reactive  Labs/imaging reviewed   Resolved problem list  Hypotension Assessment and Plan  Out-of-hospital cardiac arrest: Unclear etiology.  Do not suspect PE as he is on Eliquis  as an outpatient -- Continue supportive care  Metabolic encephalopathy: Concern for anoxia.  Fortunately it sounds like what appears his exam is improving over time.  Following commands a.m. 7/28.  Still significantly encephalopathic on minimal sedation. 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  Changed to heparin  drip while in ICU  Goals of care 7/27 - Discussed with wife and sister at bedside.  They do not want him to suffer and have agreed to DNR Continue vent support for now until his neurologic recovery is assessed They are clear that he would not want long-term  support.  Best Practice (right click and Reselect all SmartList Selections daily)   Diet/type: tubefeeds DVT prophylaxis systemic heparin  Pressure ulcer(s): present on admission  GI prophylaxis: H2B Lines: Dialysis Catheter Foley:  N/A Code Status:  full code Last date of multidisciplinary goals of care discussion [ongoing with family]. Wife updated 7/27  Critical care time:    CRITICAL CARE Performed by: Donnice JONELLE Beals   Total critical care time: 32 minutes  Critical care time was exclusive of separately billable procedures and treating other patients.  Critical care was necessary to treat or prevent imminent or life-threatening deterioration.  Critical care was time spent personally by me on the following activities: development of treatment plan with patient and/or surrogate as well as nursing, discussions with consultants, evaluation of patient's response to treatment, examination of patient, obtaining history from patient or surrogate, ordering and performing treatments and interventions, ordering and review of laboratory studies, ordering and review of radiographic studies, pulse oximetry and re-evaluation of patient's condition.   Donnice JONELLE Beals, MD [facility] See Amion If no response to pager , please call [phone] until 7pm After 7:00 pm call Elink  [phone] 04/23/2024, 9:31 AM

## 2024-04-23 NOTE — Progress Notes (Signed)
 Pt receives out-pt HD at [facility] on TTS 11:45 am chair time. Will assist as needed.   Randine Mungo Dialysis Navigator [phone]

## 2024-04-23 NOTE — Plan of Care (Signed)
  Problem: Fluid Volume: Goal: Ability to maintain a balanced intake and output will improve Outcome: Progressing   Problem: Metabolic: Goal: Ability to maintain appropriate glucose levels will improve Outcome: Progressing   Problem: Nutritional: Goal: Maintenance of adequate nutrition will improve Outcome: Progressing   Problem: Skin Integrity: Goal: Risk for impaired skin integrity will decrease Outcome: Progressing   Problem: Tissue Perfusion: Goal: Adequacy of tissue perfusion will improve Outcome: Progressing   Problem: Respiratory: Goal: Ability to maintain a clear airway and adequate ventilation will improve Outcome: Progressing   Problem: Clinical Measurements: Goal: Ability to maintain clinical measurements within normal limits will improve Outcome: Progressing   Problem: Clinical Measurements: Goal: Will remain free from infection Outcome: Progressing   Problem: Clinical Measurements: Goal: Diagnostic test results will improve Outcome: Progressing   Problem: Safety: Goal: Ability to remain free from injury will improve Outcome: Progressing   Problem: Skin Integrity: Goal: Risk for impaired skin integrity will decrease Outcome: Progressing   Problem: Nutrition Goal: Patient maintains adequate hydration Outcome: Progressing   Plan  of care, assessment, treatment, monitoring, and intervention  (s) ongoing, see MAR see flowsheet

## 2024-04-23 NOTE — Progress Notes (Signed)
 PHARMACY - ANTICOAGULATION CONSULT NOTE  Pharmacy Consult for Heparin   Indication: DVT  Allergies  Allergen Reactions   Hydrochlorothiazide     precipitates gout   Pseudoephedrine Rash    Patient Measurements: Height: 5' 8 (172.7 cm) Weight: 73 kg (160 lb 15 oz) IBW/kg (Calculated) : 68.4 HEPARIN  DW (KG): 76.2  Vital Signs: Temp: 100 F (37.8 C) (07/28 2000) Temp Source: Axillary (07/28 2000) BP: 141/67 (07/28 2200) Pulse Rate: 85 (07/28 2200)  Labs: Recent Labs    04/21/24 0306 04/21/24 1334 04/22/24 0922 04/22/24 0928 04/23/24 0239 04/23/24 1140 04/23/24 2222  HGB 10.2*  --  10.2*  --  8.9*  --   --   HCT 31.1*  --  31.3*  --  27.1*  --   --   PLT 144*  --  188  --  144*  --   --   APTT 105* 66*  --  78*  --   --   --   HEPARINUNFRC >1.10* 0.64  --  0.30 0.22* 0.24* 0.24*  CREATININE 4.10*  --  3.59*  --  5.19*  --   --     Estimated Creatinine Clearance: 12.1 mL/min (A) (by C-G formula based on SCr of 5.19 mg/dL (H)).   Assessment: 73 yo M presented s/p cardiac arrest and h/o chronic DVT. On xarelto  prior to admission, last dose unknown. Pharmacy consulted to dose heparin  infusion.   Heparin  level 0.24, subtherapeutic. Hgb 8.9, PLT 144.  No overt bleeding concerns or infusion issues per RN.   7/28 PM update:  Heparin  level sub-therapeutic   Goal of Therapy:  Heparin  level 0.3-0.7 units/mL Monitor platelets by anticoagulation protocol: Yes   Plan:  Increase heparin  infusion to 1700 units/hr  Check heparin  level in 8 hours and daily while on heparin   Continue to monitor H&H and platelets   Lynwood Mckusick, PharmD, BCPS Clinical Pharmacist Phone: [phone]

## 2024-04-23 NOTE — Progress Notes (Signed)
 PHARMACY - ANTICOAGULATION CONSULT NOTE  Pharmacy Consult for Heparin   Indication: DVT  Allergies  Allergen Reactions   Hydrochlorothiazide     precipitates gout   Pseudoephedrine Rash    Patient Measurements: Height: 5' 8 (172.7 cm) Weight: 73 kg (160 lb 15 oz) IBW/kg (Calculated) : 68.4 HEPARIN  DW (KG): 76.2  Vital Signs: Temp: 99.6 F (37.6 C) (07/28 1129) Temp Source: Axillary (07/28 1129) BP: 139/72 (07/28 1122) Pulse Rate: 80 (07/28 1122)  Labs: Recent Labs    04/21/24 0306 04/21/24 1334 04/22/24 0922 04/22/24 0928 04/23/24 0239 04/23/24 1140  HGB 10.2*  --  10.2*  --  8.9*  --   HCT 31.1*  --  31.3*  --  27.1*  --   PLT 144*  --  188  --  144*  --   APTT 105* 66*  --  78*  --   --   HEPARINUNFRC >1.10* 0.64  --  0.30 0.22* 0.24*  CREATININE 4.10*  --  3.59*  --  5.19*  --     Estimated Creatinine Clearance: 12.1 mL/min (A) (by C-G formula based on SCr of 5.19 mg/dL (H)).   Assessment: 73 yo M presented s/p cardiac arrest and h/o chronic DVT. On xarelto  prior to admission, last dose unknown. Pharmacy consulted to dose heparin  infusion.   Heparin  level 0.24, subtherapeutic. Hgb 8.9, PLT 144.  No overt bleeding concerns or infusion issues per RN.   7/28 PM update:  Heparin  level sub-therapeutic   Goal of Therapy:  Heparin  level 0.3-0.7 units/mL Monitor platelets by anticoagulation protocol: Yes   Plan:  Heparin  1150 units as a bolus Increase heparin  infusion at 1550 units/hr  Check heparin  level in 8 hours and daily while on heparin   Continue to monitor H&H and platelets   Thank you for allowing pharmacy to be a part of this patient's care    Prentice DOROTHA Favors, PharmD PGY1 Health-System Pharmacy Administration and Leadership Resident [facility]  04/23/2024 1:03 PM

## 2024-04-23 NOTE — Progress Notes (Signed)
 PHARMACY - ANTICOAGULATION CONSULT NOTE  Pharmacy Consult for Heparin   Indication: DVT  Allergies  Allergen Reactions   Hydrochlorothiazide     precipitates gout   Pseudoephedrine Rash    Patient Measurements: Height: 5' 8 (172.7 cm) Weight: 73.7 kg (162 lb 7.7 oz) IBW/kg (Calculated) : 68.4 HEPARIN  DW (KG): 76.2  Vital Signs: Temp: 100.1 F (37.8 C) (07/28 0000) Temp Source: Axillary (07/28 0000) BP: 125/55 (07/28 0215) Pulse Rate: 73 (07/28 0215)  Labs: Recent Labs    04/20/24 9177 04/20/24 1727 04/21/24 0306 04/21/24 1334 04/22/24 0922 04/22/24 0928 04/23/24 0239  HGB 10.3*  --  10.2*  --  10.2*  --  8.9*  HCT 32.0*  --  31.1*  --  31.3*  --  27.1*  PLT 206  --  144*  --  188  --  144*  APTT  --    < > 105* 66*  --  78*  --   HEPARINUNFRC  --    < > >1.10* 0.64  --  0.30 0.22*  CREATININE 3.21*  --  4.10*  --  3.59*  --  5.19*  TROPONINIHS 59*  --   --   --   --   --   --    < > = values in this interval not displayed.    Estimated Creatinine Clearance: 12.1 mL/min (A) (by C-G formula based on SCr of 5.19 mg/dL (H)).   Assessment: 73 yo M presented s/p cardiac arrest and h/o chronic DVT. On xarelto  prior to admission, last dose unknown. Pharmacy consulted to dose heparin  infusion.   Heparin  level 0.3, borderline therapeutic, now correlating with aPTT.  APTT 78, therapeutic.  No overt bleeding concerns or infusion issues per RN.   7/28 AM update:  Heparin  level sub-therapeutic   Goal of Therapy:  Heparin  level 0.3-0.7 units/mL Monitor platelets by anticoagulation protocol: Yes   Plan:  Increase heparin  to 1400 units/hr Heparin  level in 8 hours  Lynwood Mckusick, PharmD, BCPS Clinical Pharmacist Phone: [phone]

## 2024-04-23 NOTE — Procedures (Signed)
 Continuous Video-EEG Monitoring Report  CPT/Type of Study: 95720 (EEG with video, 12-26 hours)       Indications for Procedure: Altered mental status  Primary neurological diagnosis: G93.1; I46.9  Duration of study: 04/22/2024 07:30 to 04/23/2024 09:15  History: This is a 73 year old male presenting with altered mental status after cardiac arrest. Continuous video-EEG monitoring was performed to evaluate for seizure.   Technical Description: The EEG was performed using standard setting per the guidelines of American Clinical Neurophysiology Society (ACNS). A minimum of 21 electrodes were placed on scalp according to the International 10-20 or/and 10-10 Systems. Supplemental electrodes were placed as needed. Single EKG electrode was also used to detect cardiac arrhythmia. Patient's behavior was continuously recorded on video simultaneously with EEG. A minimum of 16 channels were used for data display. Each epoch of study was reviewed manually daily and as needed using standard referential and bipolar montages. Computerized quantitative EEG analysis (such as compressed spectral array analysis, trending, automated spike & seizure detection) were used as indicated.   EEG Description:  Epoch: 04/22/2024 07:30 to 04/23/2024 09:15  Background: There was generalized polymorphic delta and theta slowing. No discernible posterior dominant rhythm or sleep architecture was recorded. Diffuse beta activity was seen, likely due to sedative medication effect (propofol ).   PERIODIC OR RHYTHMIC PATTERNS: None.  EPILEPTIFORM DISCHARGES: None.  SEIZURES: None.  EVENTS: Intermittent myoclonic movement of head or upper body was captured which did not have EEG correlate.   EKG: No significant arrhythmia.   Impression:  This is an abnormal EEG due to the presence of generalized polymorphic delta and theta slowing, suggesting moderate to severe encephalopathy, which is nonspecific as to etiology (medication  effect of propofol  could not be ruled out). No epileptiform discharge or seizure is present. The captured myoclonic movement of head or upper body has no EEG correlate.

## 2024-04-23 NOTE — Plan of Care (Signed)
  Problem: Metabolic: Goal: Ability to maintain appropriate glucose levels will improve Outcome: Progressing   Problem: Nutritional: Goal: Maintenance of adequate nutrition will improve Outcome: Progressing   Problem: Skin Integrity: Goal: Risk for impaired skin integrity will decrease Outcome: Progressing   Problem: Tissue Perfusion: Goal: Adequacy of tissue perfusion will improve Outcome: Progressing   Problem: Clinical Measurements: Goal: Will remain free from infection Outcome: Progressing Goal: Cardiovascular complication will be avoided Outcome: Progressing

## 2024-04-24 ENCOUNTER — Other Ambulatory Visit (HOSPITAL_COMMUNITY): Payer: Self-pay

## 2024-04-24 ENCOUNTER — Encounter (HOSPITAL_COMMUNITY): Payer: Self-pay

## 2024-04-24 DIAGNOSIS — N186 End stage renal disease: Secondary | ICD-10-CM | POA: Diagnosis not present

## 2024-04-24 DIAGNOSIS — G9341 Metabolic encephalopathy: Secondary | ICD-10-CM | POA: Diagnosis not present

## 2024-04-24 DIAGNOSIS — I469 Cardiac arrest, cause unspecified: Secondary | ICD-10-CM | POA: Diagnosis not present

## 2024-04-24 DIAGNOSIS — Z992 Dependence on renal dialysis: Secondary | ICD-10-CM | POA: Diagnosis not present

## 2024-04-24 LAB — CBC
HCT: 26.9 % — ABNORMAL LOW (ref 39.0–52.0)
Hemoglobin: 8.9 g/dL — ABNORMAL LOW (ref 13.0–17.0)
MCH: 27.9 pg (ref 26.0–34.0)
MCHC: 33.1 g/dL (ref 30.0–36.0)
MCV: 84.3 fL (ref 80.0–100.0)
Platelets: 159 K/uL (ref 150–400)
RBC: 3.19 MIL/uL — ABNORMAL LOW (ref 4.22–5.81)
RDW: 16.1 % — ABNORMAL HIGH (ref 11.5–15.5)
WBC: 5.5 K/uL (ref 4.0–10.5)
nRBC: 0 % (ref 0.0–0.2)

## 2024-04-24 LAB — MAGNESIUM: Magnesium: 1.9 mg/dL (ref 1.7–2.4)

## 2024-04-24 LAB — HEPATITIS B SURFACE ANTIBODY, QUANTITATIVE: Hep B S AB Quant (Post): 742 m[IU]/mL

## 2024-04-24 LAB — IRON AND TIBC
Iron: 13 ug/dL — ABNORMAL LOW (ref 45–182)
Saturation Ratios: 9 % — ABNORMAL LOW (ref 17.9–39.5)
TIBC: 148 ug/dL — ABNORMAL LOW (ref 250–450)
UIBC: 135 ug/dL

## 2024-04-24 LAB — BASIC METABOLIC PANEL WITH GFR
Anion gap: 13 (ref 5–15)
BUN: 44 mg/dL — ABNORMAL HIGH (ref 8–23)
CO2: 23 mmol/L (ref 22–32)
Calcium: 8 mg/dL — ABNORMAL LOW (ref 8.9–10.3)
Chloride: 95 mmol/L — ABNORMAL LOW (ref 98–111)
Creatinine, Ser: 5.86 mg/dL — ABNORMAL HIGH (ref 0.61–1.24)
GFR, Estimated: 9 mL/min — ABNORMAL LOW (ref >60)
Glucose, Bld: 127 mg/dL — ABNORMAL HIGH (ref 70–99)
Potassium: 3 mmol/L — ABNORMAL LOW (ref 3.5–5.1)
Sodium: 131 mmol/L — ABNORMAL LOW (ref 135–145)

## 2024-04-24 LAB — GLUCOSE, CAPILLARY
Glucose-Capillary: 127 mg/dL — ABNORMAL HIGH (ref 70–99)
Glucose-Capillary: 127 mg/dL — ABNORMAL HIGH (ref 70–99)
Glucose-Capillary: 135 mg/dL — ABNORMAL HIGH (ref 70–99)
Glucose-Capillary: 156 mg/dL — ABNORMAL HIGH (ref 70–99)
Glucose-Capillary: 139 mg/dL — ABNORMAL HIGH (ref 70–99)

## 2024-04-24 LAB — PHOSPHORUS: Phosphorus: 4.2 mg/dL (ref 2.5–4.6)

## 2024-04-24 LAB — FERRITIN: Ferritin: 958 ng/mL — ABNORMAL HIGH (ref 24–336)

## 2024-04-24 MED ORDER — APIXABAN 5 MG PO TABS
5.0000 mg | ORAL_TABLET | Freq: Two times a day (BID) | ORAL | Status: DC
  Administered 2024-04-24 – 2024-04-27 (×7): 5 mg
  Filled 2024-04-24 (×7): qty 1

## 2024-04-24 MED ORDER — SENNA 8.6 MG PO TABS: 1.0000 | ORAL_TABLET | Freq: Every day | ORAL | Status: DC

## 2024-04-24 MED ORDER — HEPARIN SODIUM (PORCINE) 1000 UNIT/ML IJ SOLN
3200.0000 [IU] | Freq: Once | INTRAMUSCULAR | Status: AC
  Administered 2024-04-24: 3200 [IU]

## 2024-04-24 MED ORDER — POLYETHYLENE GLYCOL 3350 17 G PO PACK: 17.0000 g | PACK | Freq: Every day | ORAL | Status: DC

## 2024-04-24 MED ORDER — DEXMEDETOMIDINE HCL IN NACL 400 MCG/100ML IV SOLN
0.0000 ug/kg/h | INTRAVENOUS | Status: DC
  Administered 2024-04-24: 0.4 ug/kg/h via INTRAVENOUS
  Administered 2024-04-25: 0.8 ug/kg/h via INTRAVENOUS
  Filled 2024-04-24 (×2): qty 100

## 2024-04-24 MED ORDER — LEVETIRACETAM 500 MG PO TABS
1000.0000 mg | ORAL_TABLET | Freq: Two times a day (BID) | ORAL | Status: DC
  Administered 2024-04-24 – 2024-04-27 (×6): 1000 mg
  Filled 2024-04-24 (×6): qty 2

## 2024-04-24 NOTE — Plan of Care (Signed)
  Problem: Fluid Volume: Goal: Ability to maintain a balanced intake and output will improve Outcome: Progressing   Problem: Metabolic: Goal: Ability to maintain appropriate glucose levels will improve Outcome: Progressing   Problem: Nutritional: Goal: Maintenance of adequate nutrition will improve Outcome: Progressing   Problem: Tissue Perfusion: Goal: Adequacy of tissue perfusion will improve Outcome: Progressing   Problem: Respiratory: Goal: Ability to maintain a clear airway and adequate ventilation will improve Outcome: Progressing   Problem: Clinical Measurements: Goal: Will remain free from infection Outcome: Progressing   Problem: Clinical Measurements: Goal: Respiratory complications will improve Outcome: Progressing   Problem: Safety: Goal: Ability to remain free from injury will improve Outcome: Progressing   Problem: Skin Integrity: Goal: Risk for impaired skin integrity will decrease Outcome: Progressing   Problem: Nutrition Goal: Patient maintains adequate hydration Outcome: Progressing  Plan of care, assessment, treatment, monitoring and intervention (s) ongoing, see MAR see flowsheet

## 2024-04-24 NOTE — Progress Notes (Signed)
 eLink Physician-Brief Progress Note Patient Name: Mitchell [name]. DOB: July 02, 1951 MRN: [id number]   Date of Service  04/24/2024  HPI/Events of Note  Fairly agitated on the vent, has myoclonus and attempting to minimize propofol .  On camera examination, he is visibly gagging on the tube  eICU Interventions  Add Precedex .  As needed fentanyl      Intervention Category Minor Interventions: Agitation / anxiety - evaluation and management  Karlin Heilman 04/24/2024, 8:17 PM

## 2024-04-24 NOTE — Progress Notes (Signed)
 Informed about increased myoclonus  Increased dose of Keppra  to 1 g every 12

## 2024-04-24 NOTE — Plan of Care (Signed)
 Remains on ventilator d/t bradypnea with weaning  this morning.

## 2024-04-24 NOTE — Progress Notes (Signed)
   04/24/24 1945  Vitals  Temp 98.7 F (37.1 C)  BP 131/73  MAP (mmHg) 88  BP Location Left Arm  BP Method Automatic  Patient Position (if appropriate) Lying  Pulse Rate 86  Pulse Rate Source Monitor  ECG Heart Rate 86  Resp (!) 21  Oxygen Therapy  SpO2 100 %  O2 Device Ventilator  During Treatment Monitoring  Blood Flow Rate (mL/min) 0 mL/min  Arterial Pressure (mmHg) -10.7 mmHg  Venous Pressure (mmHg) 49.69 mmHg  TMP (mmHg) 6.06 mmHg  Ultrafiltration Rate (mL/min) 928 mL/min  Dialysate Flow Rate (mL/min) 299 ml/min  Dialysate Potassium Concentration 4  Dialysate Calcium  Concentration 2.25  Duration of HD Treatment -hour(s) 3.5 hour(s)  Cumulative Fluid Removed (mL) per Treatment  2500.13  HD Safety Checks Performed Yes  Intra-Hemodialysis Comments Tx completed  Post Treatment  Dialyzer Clearance Lightly streaked  Liters Processed 84  Fluid Removed (mL) 2500 mL  Tolerated HD Treatment Yes  Hemodialysis Catheter Right Subclavian Double lumen Permanent (Tunneled)  Placement Date/Time: 03/02/24 1402   Placed prior to admission: No  Serial / Lot #: [id number]  Expiration Date: 11/24/28  Time Out: Correct site;Correct patient;Correct procedure  Maximum sterile barrier precautions: Hand hygiene;Cap;Mask;Sterile gown...  Site Condition No complications  Blue Lumen Status Flushed;Heparin  locked  Red Lumen Status Flushed;Heparin  locked  Catheter fill solution Heparin  1000 units/ml  Catheter fill volume (Arterial) 1.6 cc  Catheter fill volume (Venous) 1.6  Dressing Type Transparent  Dressing Status Clean, Dry, Intact  Dressing Change Due 04/29/24

## 2024-04-24 NOTE — Progress Notes (Signed)
 [facility] Progress Note   Subjective:   Patient minimally interactive on my exam.  Primary team reports following some commands.  Objective Vitals:   04/24/24 0715 04/24/24 0733 04/24/24 0737 04/24/24 0800  BP: 139/73 139/73  137/74  Pulse: 72 83  79  Resp: 18 15  15   Temp:   98.8 F (37.1 C)   TempSrc:   Oral   SpO2: 100% 93%  100%  Weight:      Height:       Exam GEN: lying in bed, nad, sedated ENT: no nasal discharge, mmm EYES: no scleral icterus CV: normal rate, no rub PULM: Bilateral breath sounds, ventilated, bilateral chest rise ABD: NABS, non-distended SKIN: no rashes or jaundice EXT: no edema, warm and well perfused     Additional Objective Labs: Basic Metabolic Panel: Recent Labs  Lab 04/20/24 0300 04/20/24 0314 04/22/24 0922 04/23/24 0239 04/24/24 0546 04/24/24 0547  NA 134*   < > 133* 133* 131*  --   K 3.3*   < > 3.6 3.0* 3.0*  --   CL 98   < > 96* 96* 95*  --   CO2 24   < > 22 22 23   --   GLUCOSE 149*   < > 85 95 127*  --   BUN 19   < > 20 27* 44*  --   CREATININE 3.05*   < > 3.59* 5.19* 5.86*  --   CALCIUM  9.0   < > 8.2* 8.0* 8.0*  --   PHOS 4.5  --  2.9  --   --  4.2   < > = values in this interval not displayed.   Liver Function Tests: Recent Labs  Lab 04/20/24 0300  AST 35  ALT 17  ALKPHOS 89  BILITOT 0.6  PROT 6.3*  ALBUMIN 2.6*   No results for input(s): LIPASE, AMYLASE in the last 168 hours. CBC: Recent Labs  Lab 04/20/24 0822 04/21/24 0306 04/22/24 0922 04/23/24 0239 04/24/24 0547  WBC 6.7 6.7 10.2 8.8 5.5  HGB 10.3* 10.2* 10.2* 8.9* 8.9*  HCT 32.0* 31.1* 31.3* 27.1* 26.9*  MCV 88.4 87.6 86.5 85.2 84.3  PLT 206 144* 188 144* 159    CBG: Recent Labs  Lab 04/23/24 1534 04/23/24 1946 04/23/24 2333 04/24/24 0334 04/24/24 0736  GLUCAP 88 106* 119* 127* 127*     Medications:  feeding supplement (OSMOLITE 1.5 CAL) 40 mL/hr at 04/24/24 1000   propofol  (DIPRIVAN ) infusion 20 mcg/kg/min  (04/24/24 0912)    apixaban   5 mg Per Tube BID   Chlorhexidine  Gluconate Cloth  6 each Topical Daily   Chlorhexidine  Gluconate Cloth  6 each Topical Q0600   famotidine   20 mg Per Tube Daily   feeding supplement (PROSource TF20)  60 mL Per Tube Daily   insulin  aspart  0-6 Units Subcutaneous Q4H   levETIRAcetam   500 mg Per Tube BID   multivitamin  1 tablet Per Tube QHS   mouth rinse  15 mL Mouth Rinse Q2H   thiamine   100 mg Per Tube Daily   Home bp meds: Norvasc  10 Coreg  12.5 bid Lasix  60mg  on non-hd days      OP HD: TTS G-O 4h  B400   74.5kg   3K bath  TDC   Heparin  none Last OP HD 7/24, post wt 74.7kg   Good compliance, WG's 1.5-2.5 kg     Assessment/ Plan: Cardiac arrest: out of hospital arrest, early am 7/25. Is in ICU  on vent support per CCM. Concern for anoxic injury; MRI reassuring but likely had some degree of anoxic injury.  DNR but monitoring for recovery. Continue GOC with primary team and family ESRD: on HD TTS.  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 Anticoag per primary Hypotension: Had required pressors.  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  Consider ESA. Obtain iron levels  04/24/2024, 10:07 AM  [employer]

## 2024-04-24 NOTE — Progress Notes (Signed)
 NAME:  Mitchell [name]., MRN:  [id number], DOB:  02-12-1951, LOS: 4 ADMISSION DATE:  04/20/2024, CONSULTATION DATE:  04/20/24 REFERRING MD:  EDP, CHIEF COMPLAINT:  cardiac arrest   History of Present Illness:  73 yo male presented after witnessed cardiac arrest. Pt was reportedly waking up to go to restroom just before 0200. When he was ambulating back from bathroom he became acutely sob and subsequently collapsed. No pulses felt and family began CPR. EMS notified and arrives pt was found to be in PEA, acls begun. Down time 14 mins. Per EMS report pt was combative post ROSC and did req sedation. When he presented to [facility] BP was trending down and levo was initiated. Neurologically, not purposeful but does have cough/gag and spontaneously moves LE.   Per family, pt was in his normal state of health prior to this. Compliant with dialysis sessions, which have been full sessions without decreased time. No recent illness, no fever, chills, cp, cough, dyspnea. No n/v/d   No frank laboratory abnormalities to explain arrest. CTH negative for acute issues. Stat echo pending, may warrant ctpa based on presentation.   Family at bedside provides history in light of pt's intubated status.  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 Family meeting- Made DNR  Interim History / Subjective:   On more sedation for unclear reasons other than cough?  Intermittently following commands again.  MRI without acute changes obtained yesterday.  No sign of clear anoxia.  Objective    Blood pressure (!) 143/71, pulse 68, temperature 98.8 F (37.1 C), temperature source Oral, resp. rate 18, height 5' 8 (1.727 m), weight 72 kg, SpO2 100%.    Vent Mode: PRVC FiO2 (%):  [40 %] 40 % Set  Rate:  [18 bmp] 18 bmp Vt Set:  [540 mL] 540 mL PEEP:  [5 cmH20] 5 cmH20 Pressure Support:  [16 cmH20] 16 cmH20 Plateau Pressure:  [15 cmH20-18 cmH20] 15 cmH20   Intake/Output Summary (Last 24 hours) at 04/24/2024 1033 Last data filed at 04/24/2024 1000 Gross per 24 hour  Intake 1456.8 ml  Output 335 ml  Net 1121.8 ml   Filed Weights   04/22/24 0500 04/23/24 0428 04/24/24 0432  Weight: 73.7 kg 73 kg 72 kg    Examination: Gen:      No acute distress HEENT:  EOMI, sclera anicteric, ETT Neck:     No masses; no thyromegaly Lungs:    Clear to auscultation bilaterally; normal respiratory effort CV:         Regular rate and rhythm; no murmurs Abd:      + bowel sounds; soft, non-tender; no palpable masses, no distension Ext:    No edema; adequate peripheral perfusion Neuro: Arouses and follows commands with eyes and b/l feet. Pupils minimally reactive  Labs/imaging reviewed   Resolved problem list  Hypotension Assessment and Plan  Out-of-hospital cardiac arrest: Unclear etiology.  Do not suspect PE as he is on Eliquis  as an outpatient -- Continue supportive care  Metabolic encephalopathy: Concern for anoxia.  Fortunately it sounds like what appears his exam is improving over time.  Following commands.  Still significantly encephalopathic on minimal sedation. 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  Changed to heparin  drip while in ICU  Goals of care 7/27 - Discussed with wife and sister at bedside.  They do not want  him to suffer and have agreed to DNR Continue vent support for now until his neurologic recovery is assessed They are clear that he would not want long-term support.  Best Practice (right click and Reselect all SmartList Selections daily)   Diet/type: tubefeeds DVT prophylaxis systemic heparin  Pressure ulcer(s): present on admission  GI prophylaxis: H2B Lines: Dialysis Catheter Foley:  N/A Code Status:  full code Last date of multidisciplinary goals of care discussion [ongoing with family]. Wife updated 7/28, plan GOC discussion 7/29  Critical care time:    CRITICAL CARE Performed by: Donnice SAUNDERS Aerionna Moravek   Total critical care time: 30 minutes  Critical care time was exclusive of separately billable procedures and treating other patients.  Critical care was necessary to treat or prevent imminent or life-threatening deterioration.  Critical care was time spent personally by me on the following activities: development of treatment plan with patient and/or surrogate as well as nursing, discussions with consultants, evaluation of patient's response to treatment, examination of patient, obtaining history from patient or surrogate, ordering and performing treatments and interventions, ordering and review of laboratory studies, ordering and review of radiographic studies, pulse oximetry and re-evaluation of patient's condition.   Donnice SAUNDERS Beals, MD [facility] See Amion If no response to pager , please call [phone] until 7pm After 7:00 pm call Elink  [phone] 04/24/2024, 10:33 AM

## 2024-04-25 DIAGNOSIS — I469 Cardiac arrest, cause unspecified: Secondary | ICD-10-CM | POA: Diagnosis not present

## 2024-04-25 DIAGNOSIS — G253 Myoclonus: Secondary | ICD-10-CM

## 2024-04-25 DIAGNOSIS — G9341 Metabolic encephalopathy: Secondary | ICD-10-CM | POA: Diagnosis not present

## 2024-04-25 DIAGNOSIS — I5040 Unspecified combined systolic (congestive) and diastolic (congestive) heart failure: Secondary | ICD-10-CM

## 2024-04-25 DIAGNOSIS — N186 End stage renal disease: Secondary | ICD-10-CM | POA: Diagnosis not present

## 2024-04-25 LAB — CULTURE, BLOOD (ROUTINE X 2)
Culture: NO GROWTH
Culture: NO GROWTH
Special Requests: ADEQUATE

## 2024-04-25 LAB — CBC
HCT: 26.7 % — ABNORMAL LOW (ref 39.0–52.0)
Hemoglobin: 8.9 g/dL — ABNORMAL LOW (ref 13.0–17.0)
MCH: 28 pg (ref 26.0–34.0)
MCHC: 33.3 g/dL (ref 30.0–36.0)
MCV: 84 fL (ref 80.0–100.0)
Platelets: 163 K/uL (ref 150–400)
RBC: 3.18 MIL/uL — ABNORMAL LOW (ref 4.22–5.81)
RDW: 15.9 % — ABNORMAL HIGH (ref 11.5–15.5)
WBC: 4.4 K/uL (ref 4.0–10.5)
nRBC: 0 % (ref 0.0–0.2)

## 2024-04-25 LAB — GLUCOSE, CAPILLARY
Glucose-Capillary: 130 mg/dL — ABNORMAL HIGH (ref 70–99)
Glucose-Capillary: 138 mg/dL — ABNORMAL HIGH (ref 70–99)
Glucose-Capillary: 138 mg/dL — ABNORMAL HIGH (ref 70–99)
Glucose-Capillary: 145 mg/dL — ABNORMAL HIGH (ref 70–99)
Glucose-Capillary: 159 mg/dL — ABNORMAL HIGH (ref 70–99)
Glucose-Capillary: 160 mg/dL — ABNORMAL HIGH (ref 70–99)
Glucose-Capillary: 173 mg/dL — ABNORMAL HIGH (ref 70–99)

## 2024-04-25 LAB — PHOSPHORUS: Phosphorus: 1.8 mg/dL — ABNORMAL LOW (ref 2.5–4.6)

## 2024-04-25 LAB — MAGNESIUM: Magnesium: 1.9 mg/dL (ref 1.7–2.4)

## 2024-04-25 MED ORDER — MIDAZOLAM HCL 2 MG/2ML IJ SOLN
2.0000 mg | INTRAMUSCULAR | Status: DC | PRN
  Administered 2024-04-25 – 2024-04-27 (×16): 2 mg via INTRAVENOUS
  Filled 2024-04-25 (×15): qty 2

## 2024-04-25 MED ORDER — SODIUM PHOSPHATES 45 MMOLE/15ML IV SOLN
30.0000 mmol | Freq: Once | INTRAVENOUS | Status: AC
  Administered 2024-04-25: 30 mmol via INTRAVENOUS
  Filled 2024-04-25: qty 10

## 2024-04-25 NOTE — Plan of Care (Signed)
  Problem: Nutritional: Goal: Maintenance of adequate nutrition will improve Outcome: Progressing   Problem: Tissue Perfusion: Goal: Adequacy of tissue perfusion will improve Outcome: Progressing   Problem: Respiratory: Goal: Ability to maintain a clear airway and adequate ventilation will improve Outcome: Not Progressing   Problem: Role Relationship: Goal: Method of communication will improve Outcome: Not Progressing   Problem: Clinical Measurements: Goal: Respiratory complications will improve Outcome: Progressing Goal: Cardiovascular complication will be avoided Outcome: Progressing   Problem: Coping: Goal: Level of anxiety will decrease Outcome: Progressing   Problem: Safety: Goal: Ability to remain free from injury will improve Outcome: Progressing

## 2024-04-25 NOTE — Progress Notes (Signed)
 [facility] Progress Note   Subjective:   Not interactive on vent.  Objective Vitals:   04/25/24 0808 04/25/24 0900 04/25/24 1000 04/25/24 1112  BP:  135/73 134/69   Pulse: 70 65 68   Resp: (!) 21 (!) 22 (!) 22   Temp:    98.6 F (37 C)  TempSrc:    Axillary  SpO2: 100% 100% 100%   Weight:      Height:       Exam GEN: lying in bed, nad but intermittently jerking movements ENT: no nasal discharge, mmm EYES: no scleral icterus CV: normal rate, no rub PULM: Bilateral breath sounds, ventilated, bilateral chest rise ABD: NABS, non-distended SKIN: no rashes or jaundice EXT: no edema, warm and well perfused     Additional Objective Labs: Basic Metabolic Panel: Recent Labs  Lab 04/22/24 0922 04/23/24 0239 04/24/24 0546 04/24/24 0547 04/25/24 0409  NA 133* 133* 131*  --   --   K 3.6 3.0* 3.0*  --   --   CL 96* 96* 95*  --   --   CO2 22 22 23   --   --   GLUCOSE 85 95 127*  --   --   BUN 20 27* 44*  --   --   CREATININE 3.59* 5.19* 5.86*  --   --   CALCIUM  8.2* 8.0* 8.0*  --   --   PHOS 2.9  --   --  4.2 1.8*   Liver Function Tests: Recent Labs  Lab 04/20/24 0300  AST 35  ALT 17  ALKPHOS 89  BILITOT 0.6  PROT 6.3*  ALBUMIN 2.6*   No results for input(s): LIPASE, AMYLASE in the last 168 hours. CBC: Recent Labs  Lab 04/21/24 0306 04/22/24 0922 04/23/24 0239 04/24/24 0547 04/25/24 0409  WBC 6.7 10.2 8.8 5.5 4.4  HGB 10.2* 10.2* 8.9* 8.9* 8.9*  HCT 31.1* 31.3* 27.1* 26.9* 26.7*  MCV 87.6 86.5 85.2 84.3 84.0  PLT 144* 188 144* 159 163    CBG: Recent Labs  Lab 04/24/24 1939 04/25/24 0003 04/25/24 0353 04/25/24 0753 04/25/24 1114  GLUCAP 139* 130* 160* 159* 173*     Medications:  feeding supplement (OSMOLITE 1.5 CAL) 60 mL/hr at 04/25/24 1000   sodium PHOSPHATE  IVPB (in mmol) 30 mmol (04/25/24 1035)    apixaban   5 mg Per Tube BID   Chlorhexidine  Gluconate Cloth  6 each Topical Daily   Chlorhexidine  Gluconate Cloth  6  each Topical Q0600   famotidine   20 mg Per Tube Daily   feeding supplement (PROSource TF20)  60 mL Per Tube Daily   insulin  aspart  0-6 Units Subcutaneous Q4H   levETIRAcetam   1,000 mg Per Tube BID   multivitamin  1 tablet Per Tube QHS   mouth rinse  15 mL Mouth Rinse Q2H   thiamine   100 mg Per Tube Daily   Home bp meds: Norvasc  10 Coreg  12.5 bid Lasix  60mg  on non-hd days      OP HD: TTS G-O 4h  B400   74.5kg   3K bath  TDC   Heparin  none Last OP HD 7/24, post wt 74.7kg   Good compliance, WG's 1.5-2.5 kg     Assessment/ Plan: Cardiac arrest: out of hospital arrest, early am 7/25. Is in ICU on vent support per CCM. Concern for anoxic injury; MRI reassuring but likely had some degree of anoxic injury.  DNR but monitoring for recovery. Continue GOC with primary team and family ESRD:  on HD TTS.  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 Anticoag per primary Hypotension: Had required pressors.  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  Consider ESA. Iron borderline. May consider IV iron dose pending progress  04/25/2024, 11:16 AM  [employer]

## 2024-04-25 NOTE — Progress Notes (Signed)
 NAME:  Mitchell [name]., MRN:  [id number], DOB:  1950-10-01, LOS: 5 ADMISSION DATE:  04/20/2024, CONSULTATION DATE:  04/20/24 REFERRING MD:  EDP, CHIEF COMPLAINT:  cardiac arrest   History of Present Illness:  73 yo male presented after witnessed cardiac arrest. Pt was reportedly waking up to go to restroom just before 0200. When he was ambulating back from bathroom he became acutely sob and subsequently collapsed. No pulses felt and family began CPR. EMS notified and arrives pt was found to be in PEA, acls begun. Down time 14 mins. Per EMS report pt was combative post ROSC and did req sedation. When he presented to [facility] BP was trending down and levo was initiated. Neurologically, not purposeful but does have cough/gag and spontaneously moves LE.   Per family, pt was in his normal state of health prior to this. Compliant with dialysis sessions, which have been full sessions without decreased time. No recent illness, no fever, chills, cp, cough, dyspnea. No n/v/d   No frank laboratory abnormalities to explain arrest. CTH negative for acute issues. Stat echo pending, may warrant ctpa based on presentation.   Family at bedside provides history in light of pt's intubated status.  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 Family meeting- Made DNR  Interim History / Subjective:   Off propofol .  Myoclonus has worsened.  Keppra  increased.  Still having movements.  Precedex  was started for vent dyssynchrony it seems.  Objective    Blood pressure 130/68, pulse 63, temperature 98.6 F (37 C), temperature source Axillary, resp. rate 16, height 5' 8 (1.727 m), weight 72.5 kg, SpO2 100%.    Vent Mode: PRVC FiO2 (%):  [40 %] 40 % Set Rate:  [18 bmp] 18 bmp Vt Set:   [540 mL] 540 mL PEEP:  [5 cmH20] 5 cmH20 Plateau Pressure:  [15 cmH20-21 cmH20] 16 cmH20   Intake/Output Summary (Last 24 hours) at 04/25/2024 1214 Last data filed at 04/25/2024 1200 Gross per 24 hour  Intake 1494.21 ml  Output 2800 ml  Net -1305.79 ml   Filed Weights   04/24/24 0432 04/24/24 1547 04/25/24 0419  Weight: 72 kg 77.3 kg 72.5 kg    Examination: Gen:      No acute distress HEENT:  EOMI, sclera anicteric, ETT Neck:     No masses; no thyromegaly Lungs:    Clear to auscultation bilaterally; normal respiratory effort CV:         Regular rate and rhythm; no murmurs Abd:      + bowel sounds; soft, non-tender; no palpable masses, no distension Ext:    No edema; adequate peripheral perfusion Neuro: Arouses and follows commands with eyes and b/l feet. Pupils minimally reactive  Labs/imaging reviewed   Resolved problem list  Hypotension Assessment and Plan  Out-of-hospital cardiac arrest: Unclear etiology.  Do not suspect PE as he is on Eliquis  as an outpatient -- Continue supportive care  Metabolic encephalopathy: Concern for anoxia.  Fortunately it sounds like what appears his exam is improving over time.  Following commands.  Still significantly encephalopathic on minimal sedation. -- Minimize sedating meds, stop Precedex  -- MRI brain without acute finding no obvious signs of anoxia although certainly could be present --Clinical myoclonus has worsened with stopping continuous sedatives, this is  an ominous sign and worry that what recovery seen will be all we get and no further improvement  Myoclonus: Clinical signs of brain damage anoxia despite imaging findings. 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  Changed to heparin  drip while in ICU  Goals of care 7/27 - Discussed with wife and sister at bedside.  They do not want him to suffer and have agreed to DNR Continue vent support for now until his neurologic recovery is assessed They are clear that he would not want long-term support. 7/29-discussed given 48 more hours given there is been some neurologic improvement, if no further improvement would consider withdrawal of care, will discuss options in more detail 7/31  Best Practice (right click and Reselect all SmartList Selections daily)   Diet/type: tubefeeds DVT prophylaxis systemic heparin  Pressure ulcer(s): present on admission  GI prophylaxis: H2B Lines: Dialysis Catheter Foley:  N/A Code Status:  full code Last date of multidisciplinary goals of care discussion [ongoing with family]. Wife updated daily, plan GOC discussion 7/31  Critical care time:    CRITICAL CARE Performed by: Donnice SAUNDERS [place]ia   Total critical care time: 32 minutes  Critical care time was exclusive of separately billable procedures and treating other patients.  Critical care was necessary to treat or prevent imminent or life-threatening deterioration.  Critical care was time spent personally by me on the following activities: development of treatment plan with patient and/or surrogate as well as nursing, discussions with consultants, evaluation of patient's response to treatment, examination of patient, obtaining history from patient or surrogate, ordering and performing treatments and interventions, ordering and review of laboratory studies, ordering and review of radiographic studies, pulse oximetry and re-evaluation of patient's condition.   Donnice SAUNDERS Beals, MD [facility] See Amion If no response to pager , please call [phone] until 7pm After 7:00 pm call Elink  [phone] 04/25/2024, 12:14 PM

## 2024-04-25 NOTE — TOC Initial Note (Signed)
 Transition of Care (TOC) - Initial/Assessment Note    Patient Details  Name: Mitchell Gulbranson Sr. MRN: [id number] Date of Birth: 07-19-1951  Transition of Care [facility]) CM/SW Contact:    Lauraine FORBES Saa, LCSW Phone Number: 04/25/2024, 1:33 PM  Clinical Narrative:                  1:33 PM CSW introduced self and role to patient's spouse, Mitchell [name] (patient remains intubated). Mitchell [name] confirmed patients resides with her and their adult daughter. Mitchell [name] confirmed she transports patient to appointments and would be able to transport patient home upon discharge if needed. Mitchell [name] confirmed patient's SNF history at [facility]. Mitchell [name] stated patient had HH through [facility]) in 2018. Mitchell [name] confirmed patient has DME (wooden cane, rolling walker, CPAP) at home. Mitchell [name] confirmed patient attends outpatient dialysis at [facility] TTHS 11:45AM chair time but can check in as early as 11:00AM.  Expected Discharge Plan: Home/Self Care Barriers to Discharge: Continued Medical Work up   Patient Goals and CMS Choice            Expected Discharge Plan and Services       Living arrangements for the past 2 months: Single Family Home                                      Prior Living Arrangements/Services Living arrangements for the past 2 months: Single Family Home Lives with:: Spouse, Adult Children Patient language and need for interpreter reviewed:: Yes        Need for Family Participation in Patient Care: Yes (Comment) Care giver support system in place?: Yes (comment) Current home services: DME Criminal Activity/Legal Involvement Pertinent to Current Situation/Hospitalization: No - Comment as needed  Activities of Daily Living      Permission Sought/Granted Permission sought to share information with : Family Supports Permission granted to share information with : No (Contact information on chart)  Share Information with NAME: Mitchell [name]     Permission granted to share  info w Relationship: Spouse  Permission granted to share info w Contact Information: [phone]  Emotional Assessment   Attitude/Demeanor/Rapport: Unable to Assess, Intubated (Following Commands or Not Following Commands) Affect (typically observed): Unable to Assess   Alcohol / Substance Use: Not Applicable Psych Involvement: No (comment)  Admission diagnosis:  Cardiac arrest 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) 06/29/2018   Normochromic normocytic anemia 06/22/2018   Accelerated hypertension 06/20/2018   Seizure disorder (HCC) 06/15/2018   History of substance abuse (HCC) 06/15/2018   Myoclonus 06/15/2018   Abnormal EKG 06/15/2018   CVA (cerebral vascular accident) (HCC) 06/07/2018   Acute pancreatitis 03/08/2017   Chronic combined systolic and diastolic CHF (congestive heart failure) (HCC) 07/28/2015   PEG (percutaneous endoscopic gastrostomy) status (HCC) 07/28/2015   History of CVA (cerebrovascular accident) 07/28/2015   Hypomagnesemia    Primary gout    Altered mental status    Dysphagia    Acute respiratory failure with hypoxemia (HCC)    Pulmonary edema  Tracheal perforation    CAP (community acquired pneumonia)    Chronic systolic CHF (congestive heart failure) (HCC)    Acute renal failure (HCC)    Alcohol abuse    Anoxic brain injury (HCC)    Thrombocytopenia (HCC)    History of ETT    Cardiac arrest (HCC) 07/08/2015   URINARY TRACT INFECTION, RECURRENT 01/04/2007   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 [place] [address] Phone: [phone] Fax: [phone]     Social Drivers of Health (SDOH) Social History: SDOH Screenings   Food Insecurity: Patient Unable To Answer (04/20/2024)  Housing: Patient Unable To Answer (04/20/2024)  Transportation Needs: Patient Unable To Answer (04/20/2024)  Utilities: Patient Unable To Answer (04/20/2024)  Depression (PHQ2-9): Low Risk  (03/24/2023)  Social Connections: Patient Unable To Answer (04/20/2024)  Tobacco Use: Low Risk  (04/20/2024)   SDOH Interventions:     Readmission Risk Interventions     No data to display

## 2024-04-26 DIAGNOSIS — I469 Cardiac arrest, cause unspecified: Secondary | ICD-10-CM | POA: Diagnosis not present

## 2024-04-26 DIAGNOSIS — G253 Myoclonus: Secondary | ICD-10-CM | POA: Diagnosis not present

## 2024-04-26 DIAGNOSIS — G9341 Metabolic encephalopathy: Secondary | ICD-10-CM | POA: Diagnosis not present

## 2024-04-26 DIAGNOSIS — N186 End stage renal disease: Secondary | ICD-10-CM | POA: Diagnosis not present

## 2024-04-26 LAB — RENAL FUNCTION PANEL
Albumin: 2.3 g/dL — ABNORMAL LOW (ref 3.5–5.0)
Anion gap: 11 (ref 5–15)
BUN: 48 mg/dL — ABNORMAL HIGH (ref 8–23)
CO2: 27 mmol/L (ref 22–32)
Calcium: 8.1 mg/dL — ABNORMAL LOW (ref 8.9–10.3)
Chloride: 100 mmol/L (ref 98–111)
Creatinine, Ser: 5.6 mg/dL — ABNORMAL HIGH (ref 0.61–1.24)
GFR, Estimated: 10 mL/min — ABNORMAL LOW (ref >60)
Glucose, Bld: 143 mg/dL — ABNORMAL HIGH (ref 70–99)
Phosphorus: 3.2 mg/dL (ref 2.5–4.6)
Potassium: 4.2 mmol/L (ref 3.5–5.1)
Sodium: 138 mmol/L (ref 135–145)

## 2024-04-26 LAB — CBC
HCT: 26.7 % — ABNORMAL LOW (ref 39.0–52.0)
Hemoglobin: 8.8 g/dL — ABNORMAL LOW (ref 13.0–17.0)
MCH: 27.8 pg (ref 26.0–34.0)
MCHC: 33 g/dL (ref 30.0–36.0)
MCV: 84.2 fL (ref 80.0–100.0)
Platelets: 179 K/uL (ref 150–400)
RBC: 3.17 MIL/uL — ABNORMAL LOW (ref 4.22–5.81)
RDW: 16.5 % — ABNORMAL HIGH (ref 11.5–15.5)
WBC: 7.3 K/uL (ref 4.0–10.5)
nRBC: 0 % (ref 0.0–0.2)

## 2024-04-26 LAB — GLUCOSE, CAPILLARY
Glucose-Capillary: 137 mg/dL — ABNORMAL HIGH (ref 70–99)
Glucose-Capillary: 142 mg/dL — ABNORMAL HIGH (ref 70–99)
Glucose-Capillary: 145 mg/dL — ABNORMAL HIGH (ref 70–99)
Glucose-Capillary: 157 mg/dL — ABNORMAL HIGH (ref 70–99)
Glucose-Capillary: 141 mg/dL — ABNORMAL HIGH (ref 70–99)
Glucose-Capillary: 147 mg/dL — ABNORMAL HIGH (ref 70–99)

## 2024-04-26 NOTE — Progress Notes (Signed)
  [facility] Progress Note   Subjective:   Not interactive on vent.  Objective Vitals:   04/26/24 0600 04/26/24 0700 04/26/24 0750 04/26/24 0751  BP: (!) 85/58 102/61    Pulse: 90 87  95  Resp: 18 18  18   Temp:   98 F (36.7 C)   TempSrc:   Axillary   SpO2: 98% 96%  100%  Weight:      Height:       Exam GEN: lying in bed, nad but frequent jerking movements of face and extremities  ENT: no nasal discharge, mmm EYES: no scleral icterus CV: normal rate, no rub PULM: Bilateral breath sounds, ventilated, bilateral chest rise ABD: NABS, non-distended SKIN: no rashes or jaundice EXT: no edema, warm and well perfused     Additional Objective Labs: Basic Metabolic Panel: Recent Labs  Lab 04/23/24 0239 04/24/24 0546 04/24/24 0547 04/25/24 0409 04/26/24 0255  NA 133* 131*  --   --  138  K 3.0* 3.0*  --   --  4.2  CL 96* 95*  --   --  100  CO2 22 23  --   --  27  GLUCOSE 95 127*  --   --  143*  BUN 27* 44*  --   --  48*  CREATININE 5.19* 5.86*  --   --  5.60*  CALCIUM  8.0* 8.0*  --   --  8.1*  PHOS  --   --  4.2 1.8* 3.2   Liver Function Tests: Recent Labs  Lab 04/20/24 0300 04/26/24 0255  AST 35  --   ALT 17  --   ALKPHOS 89  --   BILITOT 0.6  --   PROT 6.3*  --   ALBUMIN 2.6* 2.3*   No results for input(s): LIPASE, AMYLASE in the last 168 hours. CBC: Recent Labs  Lab 04/22/24 0922 04/23/24 0239 04/24/24 0547 04/25/24 0409 04/26/24 0255  WBC 10.2 8.8 5.5 4.4 7.3  HGB 10.2* 8.9* 8.9* 8.9* 8.8*  HCT 31.3* 27.1* 26.9* 26.7* 26.7*  MCV 86.5 85.2 84.3 84.0 84.2  PLT 188 144* 159 163 179    CBG: Recent Labs  Lab 04/25/24 1523 04/25/24 2018 04/25/24 2319 04/26/24 0406 04/26/24 0746  GLUCAP 138* 145* 138* 137* 142*     Medications:  feeding supplement (OSMOLITE 1.5 CAL) 1,000 mL (04/26/24 0745)    apixaban   5 mg Per Tube BID   Chlorhexidine  Gluconate Cloth  6 each Topical Daily   Chlorhexidine  Gluconate Cloth  6 each  Topical Q0600   famotidine   20 mg Per Tube Daily   feeding supplement (PROSource TF20)  60 mL Per Tube Daily   insulin  aspart  0-6 Units Subcutaneous Q4H   levETIRAcetam   1,000 mg Per Tube BID   multivitamin  1 tablet Per Tube QHS   mouth rinse  15 mL Mouth Rinse Q2H   thiamine   100 mg Per Tube Daily   Home bp meds: Norvasc  10 Coreg  12.5 bid Lasix  60mg  on non-hd days      OP HD: TTS G-O 4h  B400   74.5kg   3K bath  TDC   Heparin  none Last OP HD 7/24, post wt 74.7kg   Good compliance, WG's 1.5-2.5 kg     Assessment/ Plan: Cardiac arrest: out of hospital arrest, early am 7/25. Is in ICU on vent support per CCM. Concern for anoxic injury; MRI reassuring but likely had some degree of anoxic injury.  DNR but monitoring for  recovery. Continue GOC with primary team and family ESRD: on HD TTS.  Holding dialysis today awaiting goals of care conversations.  Continue with dialysis tomorrow pending these conversations Combined systolic and diastolic HF: last EF 20-30%. Hx of DVT: on Eliquis  as outpatient. Anticoag per primary Hypotension: Had required pressors.  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  Consider ESA. Iron borderline. May consider IV iron dose pending progress  04/26/2024, 9:46 AM  [employer]

## 2024-04-26 NOTE — Progress Notes (Signed)
 NAME:  Mitchell [name]., MRN:  [id number], DOB:  11-16-1950, LOS: 6 ADMISSION DATE:  04/20/2024, CONSULTATION DATE:  04/20/24 REFERRING MD:  EDP, CHIEF COMPLAINT:  cardiac arrest   History of Present Illness:  73 yo male presented after witnessed cardiac arrest. Pt was reportedly waking up to go to restroom just before 0200. When he was ambulating back from bathroom he became acutely sob and subsequently collapsed. No pulses felt and family began CPR. EMS notified and arrives pt was found to be in PEA, acls begun. Down time 14 mins. Per EMS report pt was combative post ROSC and did req sedation. When he presented to [facility] BP was trending down and levo was initiated. Neurologically, not purposeful but does have cough/gag and spontaneously moves LE.   Per family, pt was in his normal state of health prior to this. Compliant with dialysis sessions, which have been full sessions without decreased time. No recent illness, no fever, chills, cp, cough, dyspnea. No n/v/d   No frank laboratory abnormalities to explain arrest. CTH negative for acute issues. Stat echo pending, may warrant ctpa based on presentation.   Family at bedside provides history in light of pt's intubated status.  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 Family meeting- Made DNR  Interim History / Subjective:   Persistent myoclonus despite increasing Keppra  dose.  No further neurologic improvement.  Met with family at bedside.  Electing move to comfort care once additional people can visit.  Objective    Blood pressure 102/61, pulse 95, temperature 98.9 F (37.2 C), temperature source Oral, resp. rate 18, height 5' 8 (1.727 m), weight 72 kg, SpO2 100%.    Vent Mode: PRVC FiO2 (%):  [40 %]  40 % Set Rate:  [18 bmp] 18 bmp Vt Set:  [540 mL] 540 mL PEEP:  [5 cmH20] 5 cmH20 Plateau Pressure:  [19 cmH20-23 cmH20] 19 cmH20   Intake/Output Summary (Last 24 hours) at 04/26/2024 1353 Last data filed at 04/26/2024 0700 Gross per 24 hour  Intake 1236.79 ml  Output --  Net 1236.79 ml   Filed Weights   04/24/24 1547 04/25/24 0419 04/26/24 0500  Weight: 77.3 kg 72.5 kg 72 kg    Examination: Gen:      No acute distress HEENT:  EOMI, sclera anicteric, ETT Neck:     No masses; no thyromegaly Lungs:    Clear to auscultation bilaterally; normal respiratory effort CV:         Regular rate and rhythm; no murmurs Abd:      + bowel sounds; soft, non-tender; no palpable masses, no distension Ext:    No edema; adequate peripheral perfusion Neuro: Arouses and follows commands with eyes and b/l feet. Pupils minimally reactive  Labs/imaging reviewed   Resolved problem list  Hypotension Assessment and Plan  Out-of-hospital cardiac arrest: Unclear etiology.  Do not suspect PE as he is on Eliquis  as an outpatient -- Continue supportive care  Metabolic encephalopathy: Concern for anoxia.  Fortunately it sounds like what appears his exam is improving over time.  Following commands.  Still significantly encephalopathic on minimal sedation. -- Minimize sedating meds, stop Precedex  -- MRI brain without acute finding no obvious signs of anoxia although certainly could be present --Clinical myoclonus has worsened with  stopping continuous sedatives, this is an ominous sign and worry that what recovery seen will be all we get and no further improvement  Myoclonus: Clinical signs of brain damage anoxia despite imaging findings. -- Increased Keppra  7/29  End-stage renal disease -- Appreciate Nephrology assistance  Combined systolic, diastolic heart failure.: EF 25-30% is chronic -- Telemetry monitoring -- Appears euvolemic currently -- Hold home carvedilol  given recent hypotension, volume  status managed via dialysis  History of DVT -- On Eliquis  as outpatient.  Changed to heparin  drip while in ICU  Goals of care 7/27 - Discussed with wife and sister at bedside.  They do not want him to suffer and have agreed to DNR Continue vent support for now until his neurologic recovery is assessed They are clear that he would not want long-term support. 7/29-discussed given 48 more hours given there is been some neurologic improvement, if no further improvement would consider withdrawal of care, will discuss options in more detail 7/31  Best Practice (right click and Reselect all SmartList Selections daily)   Diet/type: tubefeeds DVT prophylaxis systemic heparin  Pressure ulcer(s): present on admission  GI prophylaxis: H2B Lines: Dialysis Catheter Foley:  N/A Code Status:  full code Last date of multidisciplinary goals of care discussion [ongoing with family]. SE IPAL 7/31  Critical care time:    CRITICAL CARE Performed by: Donnice SAUNDERS Caileigh Canche   Total critical care time: 31 minutes  Critical care time was exclusive of separately billable procedures and treating other patients.  Critical care was necessary to treat or prevent imminent or life-threatening deterioration.  Critical care was time spent personally by me on the following activities: development of treatment plan with patient and/or surrogate as well as nursing, discussions with consultants, evaluation of patient's response to treatment, examination of patient, obtaining history from patient or surrogate, ordering and performing treatments and interventions, ordering and review of laboratory studies, ordering and review of radiographic studies, pulse oximetry and re-evaluation of patient's condition.   Donnice SAUNDERS Beals, MD [facility] See Amion If no response to pager , please call [phone] until 7pm After 7:00 pm call Elink  [phone] 04/26/2024, 1:53 PM

## 2024-04-26 NOTE — TOC Progression Note (Signed)
 Transition of Care (TOC) - Progression Note    Patient Details  Name: Mitchell Revard Sr. MRN: [id number] Date of Birth: January 10, 1951  Transition of Care [facility]) CM/SW Contact  Lauraine FORBES Saa, LCSW Phone Number: 04/26/2024, 11:40 AM  Clinical Narrative:     11:40 AM Per progressions, patient is to transition to comfort care upon family arrival to bedside. Patient is anticipated to have an in-hospital death.  Expected Discharge Plan: Home/Self Care Barriers to Discharge: Continued Medical Work up               Expected Discharge Plan and Services       Living arrangements for the past 2 months: Single Family Home                                       Social Drivers of Health (SDOH) Interventions SDOH Screenings   Food Insecurity: Patient Unable To Answer (04/20/2024)  Housing: Patient Unable To Answer (04/20/2024)  Transportation Needs: Patient Unable To Answer (04/20/2024)  Utilities: Patient Unable To Answer (04/20/2024)  Depression (PHQ2-9): Low Risk  (03/24/2023)  Social Connections: Patient Unable To Answer (04/20/2024)  Tobacco Use: Low Risk  (04/20/2024)    Readmission Risk Interventions     No data to display

## 2024-04-26 NOTE — Progress Notes (Signed)
 Brief nephrology note  Patient has transition to inpatient focus on comfort but waiting for family to visit. We will sign off given no further plans for HD.

## 2024-04-26 NOTE — IPAL (Signed)
  Interdisciplinary Goals of Care Family Meeting   Date carried out: 04/26/2024  Location of the meeting: Bedside  Member's involved: Physician, Bedside Registered Nurse, and Family Member or next of kin  Durable Power of Attorney or acting medical decision maker: Wife    Discussion: We discussed goals of care for [employer] .  Wife and daughter present.  We discussed concern for anoxic brain injury.  While earlier in the week there was some intermittent following commands.  There is been no further increase in mentation.  Furthermore, with weaning of propofol  he has had significant myoclonus.  Despite escalating doses of Keppra  this is present.  This is a sign of severe brain injury despite negative MRI.  This will affect his ability to interact or perform ADLs and IADLs.  Wife states in the past he has expressed he was tired.  He would not want to live like this on machines at [facility].  I expressed that there is a high likelihood that he would be dependent on machines or artificial means of support.  Unable to wean from the ventilator given encephalopathy.  We discussed comfort measures versus giving more time.  She elected to move forward with comfort measures.  I requested that family members and loved ones be given the chance to visit.  And when the time comes, wife to let us  know and we will move forward with comfort measures and palliative extubation.   Code status:   Code Status: Do not attempt resuscitation (DNR) PRE-ARREST INTERVENTIONS DESIRED   Disposition: In-patient comfort care  Time spent for the meeting: 10 minutes    Donnice JONELLE Beals, MD  04/26/2024, 1:37 PM

## 2024-04-27 DIAGNOSIS — N186 End stage renal disease: Secondary | ICD-10-CM | POA: Diagnosis not present

## 2024-04-27 DIAGNOSIS — G253 Myoclonus: Secondary | ICD-10-CM | POA: Diagnosis not present

## 2024-04-27 DIAGNOSIS — G9341 Metabolic encephalopathy: Secondary | ICD-10-CM | POA: Diagnosis not present

## 2024-04-27 DIAGNOSIS — I469 Cardiac arrest, cause unspecified: Secondary | ICD-10-CM | POA: Diagnosis not present

## 2024-04-27 LAB — CBC
HCT: 26.1 % — ABNORMAL LOW (ref 39.0–52.0)
Hemoglobin: 8.5 g/dL — ABNORMAL LOW (ref 13.0–17.0)
MCH: 27.5 pg (ref 26.0–34.0)
MCHC: 32.6 g/dL (ref 30.0–36.0)
MCV: 84.5 fL (ref 80.0–100.0)
Platelets: 202 K/uL (ref 150–400)
RBC: 3.09 MIL/uL — ABNORMAL LOW (ref 4.22–5.81)
RDW: 16.8 % — ABNORMAL HIGH (ref 11.5–15.5)
WBC: 8 K/uL (ref 4.0–10.5)
nRBC: 0 % (ref 0.0–0.2)

## 2024-04-27 LAB — GLUCOSE, CAPILLARY
Glucose-Capillary: 153 mg/dL — ABNORMAL HIGH (ref 70–99)
Glucose-Capillary: 152 mg/dL — ABNORMAL HIGH (ref 70–99)

## 2024-04-27 MED ORDER — ACETAMINOPHEN 325 MG PO TABS: 650.0000 mg | ORAL_TABLET | Freq: Four times a day (QID) | ORAL | Status: DC | PRN

## 2024-04-27 MED ORDER — MIDAZOLAM HCL 2 MG/2ML IJ SOLN: 1.0000 mg | INTRAMUSCULAR | Status: DC | PRN

## 2024-04-27 MED ORDER — ACETAMINOPHEN 650 MG RE SUPP: 650.0000 mg | Freq: Four times a day (QID) | RECTAL | Status: DC | PRN

## 2024-04-27 MED ORDER — MIDAZOLAM-SODIUM CHLORIDE 100-0.9 MG/100ML-% IV SOLN
0.0000 mg/h | INTRAVENOUS | Status: DC
  Administered 2024-04-27: 1 mg/h via INTRAVENOUS
  Administered 2024-04-28: 4.5 mg/h via INTRAVENOUS
  Filled 2024-04-27 (×2): qty 100

## 2024-04-27 MED ORDER — MORPHINE BOLUS VIA INFUSION
5.0000 mg | INTRAVENOUS | Status: DC | PRN
  Administered 2024-04-27 (×2): 5 mg via INTRAVENOUS

## 2024-04-27 MED ORDER — GLYCOPYRROLATE 0.2 MG/ML IJ SOLN
0.2000 mg | INTRAMUSCULAR | Status: DC | PRN
  Administered 2024-04-27: 0.2 mg via INTRAVENOUS
  Filled 2024-04-27: qty 1

## 2024-04-27 MED ORDER — MORPHINE 100MG IN NS 100ML (1MG/ML) PREMIX INFUSION
0.0000 mg/h | INTRAVENOUS | Status: DC
  Administered 2024-04-27: 20 mg/h via INTRAVENOUS
  Administered 2024-04-27: 5 mg/h via INTRAVENOUS
  Administered 2024-04-27 – 2024-04-28 (×3): 20 mg/h via INTRAVENOUS
  Filled 2024-04-27 (×9): qty 100

## 2024-04-27 MED ORDER — POLYVINYL ALCOHOL 1.4 % OP SOLN: 1.0000 [drp] | Freq: Four times a day (QID) | OPHTHALMIC | Status: DC | PRN

## 2024-04-27 MED ORDER — MIDAZOLAM BOLUS VIA INFUSION (WITHDRAWAL LIFE SUSTAINING TX)
2.0000 mg | INTRAVENOUS | Status: DC | PRN
  Administered 2024-04-28 (×2): 2 mg via INTRAVENOUS

## 2024-04-27 MED ORDER — SODIUM CHLORIDE 0.9 % IV SOLN: INTRAVENOUS | Status: AC

## 2024-04-27 MED ORDER — GLYCOPYRROLATE 0.2 MG/ML IJ SOLN: 0.2000 mg | INTRAMUSCULAR | Status: DC | PRN

## 2024-04-27 MED ORDER — GLYCOPYRROLATE 1 MG PO TABS: 1.0000 mg | ORAL_TABLET | ORAL | Status: DC | PRN

## 2024-04-27 NOTE — Plan of Care (Signed)
  Problem: Fluid Volume: Goal: Ability to maintain a balanced intake and output will improve Outcome: Progressing   Problem: Metabolic: Goal: Ability to maintain appropriate glucose levels will improve Outcome: Progressing   Problem: Nutritional: Goal: Maintenance of adequate nutrition will improve Outcome: Progressing   Problem: Skin Integrity: Goal: Risk for impaired skin integrity will decrease Outcome: Progressing   Problem: Tissue Perfusion: Goal: Adequacy of tissue perfusion will improve Outcome: Progressing   Problem: Clinical Measurements: Goal: Ability to maintain clinical measurements within normal limits will improve Outcome: Progressing   Problem: Clinical Measurements: Goal: Will remain free from infection Outcome: Progressing   Problem: Clinical Measurements: Goal: Respiratory complications will improve Outcome: Progressing   Problem: Clinical Measurements: Goal: Diagnostic test results will improve Outcome: Progressing   Problem: Clinical Measurements: Goal: Cardiovascular complication will be avoided Outcome: Progressing   Problem: Nutrition: Goal: Adequate nutrition will be maintained Outcome: Progressing   Plan of care, assessment, treatment, monitoring, and intervention (s) ongoing, see , MAR see flowsheet

## 2024-04-27 NOTE — Progress Notes (Signed)
 Met with family and discussed ongoing care  We will given time to spend with the patient and we will proceed with comfort measures once they are ready  Allowed time for questions and they did not have any further questions

## 2024-04-27 NOTE — Progress Notes (Signed)
 RT terminally extubated patient to comfort. Family and RN at bedside. Patient tolerated well. No complications. RT will continue to monitor.

## 2024-04-27 NOTE — Progress Notes (Signed)
 NAME:  Mitchell [name]., MRN:  [id number], DOB:  1951/03/14, LOS: 7 ADMISSION DATE:  04/20/2024, CONSULTATION DATE:  04/20/24 REFERRING MD:  EDP, CHIEF COMPLAINT:  cardiac arrest   History of Present Illness:  73 yo male presented after witnessed cardiac arrest. Pt was reportedly waking up to go to restroom just before 0200. When he was ambulating back from bathroom he became acutely sob and subsequently collapsed. No pulses felt and family began CPR. EMS notified and arrives pt was found to be in PEA, acls begun. Down time 14 mins. Per EMS report pt was combative post ROSC and did req sedation. When he presented to [facility] BP was trending down and levo was initiated. Neurologically, not purposeful but does have cough/gag and spontaneously moves LE.   Per family, pt was in his normal state of health prior to this. Compliant with dialysis sessions, which have been full sessions without decreased time. No recent illness, no fever, chills, cp, cough, dyspnea. No n/v/d   No frank laboratory abnormalities to explain arrest. CTH negative for acute issues. Stat echo pending, may warrant ctpa based on presentation.   Family at bedside provides history in light of pt's intubated status.  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 Family meeting- Made DNR  Interim History / Subjective:   Myoclonus persists No neurological improvement Ongoing goals of care discussions  Objective    Blood pressure 113/64, pulse 95, temperature 98.5 F (36.9 C), temperature source Axillary, resp. rate 20, height 5' 8 (1.727 m), weight 70.1 kg, SpO2 97%.    Vent Mode: PRVC FiO2 (%):  [40 %] 40 % Set Rate:  [18 bmp] 18 bmp Vt Set:  [540 mL] 540 mL PEEP:  [5 cmH20] 5 cmH20 Plateau  Pressure:  [15 cmH20-21 cmH20] 18 cmH20   Intake/Output Summary (Last 24 hours) at 04/27/2024 0846 Last data filed at 04/27/2024 0700 Gross per 24 hour  Intake 1380 ml  Output 110 ml  Net 1270 ml   Filed Weights   04/26/24 0500 04/27/24 0037 04/27/24 0424  Weight: 72 kg 70.1 kg 70.1 kg    Examination: Gen:      In no distress, on vent HEENT: Endotracheal tube in place Neck:     Soft, no adenopathy Lungs:    Clear to auscultation CV:         S1-S2 appreciated Abd:      Bowel sounds appreciated Ext:    No edema, no clubbing Neuro: Arouses and follows commands with eyes and b/l feet. Pupils minimally reactive  I reviewed last 24 h vitals and pain scores, last 48 h intake and output, last 24 h labs and trends, and last 24 h imaging results.  Resolved problem list  Hypotension Assessment and Plan   Out-of-hospital cardiac arrest -Unclear etiology -Was on Eliquis  as outpatient so PE less likely  Metabolic encephalopathy - MRI with no acute findings -Myoclonus worsening -On Keppra  -Remains encephalopathic despite not being on sedation  Myoclonus Likely related to anoxic injury -Increased dose of Keppra   End-stage renal disease - Appreciate renal support  Combined systolic/diastolic heart failure Ejection fraction 25 to 30% - Currently euvolemic - Home carvedilol  on hold  History of DVT - On heparin   Ongoing goals of care discussions  Goals  of care 7/27 - Discussed with wife and sister at bedside.  They do not want him to suffer and have agreed to DNR Continue vent support for now until his neurologic recovery is assessed They are clear that he would not want long-term support. 7/29-discussed given 48 more hours given there is been some neurologic improvement, if no further improvement would consider withdrawal of care, will discuss options in more detail 7/31  Will have further conversations with spouse today  Best Practice (right click and Reselect all SmartList  Selections daily)   Diet/type: tubefeeds DVT prophylaxis systemic heparin  Pressure ulcer(s): present on admission  GI prophylaxis: H2B Lines: Dialysis Catheter Foley:  N/A Code Status:  full code Last date of multidisciplinary goals of care discussion [ongoing with family]. SE IPAL 7/31  Critical care time:    The patient is critically ill with multiple organ systems failure and requires high complexity decision making for assessment and support, frequent evaluation and titration of therapies, application of advanced monitoring technologies and extensive interpretation of multiple databases. Critical Care Time devoted to patient care services described in this note independent of APP/resident time (if applicable)  is 33 minutes.   Jennet Epley MD [facility] Personal pager: See Amion If unanswered, please page CCM On-call: #[phone]

## 2024-04-27 DEATH — deceased

## 2024-04-28 LAB — CBC
HCT: 26.2 % — ABNORMAL LOW (ref 39.0–52.0)
Hemoglobin: 8.4 g/dL — ABNORMAL LOW (ref 13.0–17.0)
MCH: 27.7 pg (ref 26.0–34.0)
MCHC: 32.1 g/dL (ref 30.0–36.0)
MCV: 86.5 fL (ref 80.0–100.0)
Platelets: 249 K/uL (ref 150–400)
RBC: 3.03 MIL/uL — ABNORMAL LOW (ref 4.22–5.81)
RDW: 17.3 % — ABNORMAL HIGH (ref 11.5–15.5)
WBC: 9 K/uL (ref 4.0–10.5)
nRBC: 0 % (ref 0.0–0.2)

## 2024-05-02 ENCOUNTER — Ambulatory Visit (HOSPITAL_COMMUNITY)

## 2024-05-16 ENCOUNTER — Ambulatory Visit: Admitting: Physician Assistant

## 2024-05-28 NOTE — Care Plan (Signed)
 This patient passed away before I was able to see him. Patient was PCCM pickup for today with Comfort care in progress. Patient passed way today in the morning, will confirm timings of the death.

## 2024-05-28 NOTE — Progress Notes (Signed)
 Late Note Entry- April 30, 2024  Contacted [facility] staff to be advised of pt's passing.   Randine Mungo Dialysis Navigator [phone]

## 2024-05-28 NOTE — Progress Notes (Signed)
 Wasted 74 [employer]  w Psychologist, sport and exercise in [employer]

## 2024-05-28 NOTE — Death Summary Note (Signed)
 DEATH SUMMARY   Patient Details  Name: Mitchell Gettel Sr. MRN: [id number] DOB: 1951/05/08  Admission/Discharge Information   Admit Date:  04-25-2024  Date of Death: Date of Death: 05-03-2024  Time of Death: Time of Death: 0836  Length of Stay: 8  Referring Physician: Leigh Lung, MD   Reason(s) for Hospitalization  Patient was brought into the hospital following out-of-hospital cardiac arrest  Diagnoses  Preliminary cause of death:  Anoxic brain injury Secondary Diagnoses (including complications and co-morbidities):  Principal Problem:   Cardiac arrest [facility]) Active Problems:   Protein-calorie malnutrition, severe End-stage renal disease dialysis Metabolic encephalopathy Myoclonus Combined systolic/diastolic heart failure History of deep vein thrombosis History of CVA  Brief Hospital Course (including significant findings, care, treatment, and services provided and events leading to death)  Mitchell Koepp. is a 73 y.o. year old male who apparently was in his normal state of health, history of systolic and diastolic heart failure, end-stage renal disease on dialysis who was brought into the hospital following a cardiac arrest, 14 minutes before ROSC was achieved.  Had a PEA arrest.  He was combative post ROSC.  Did have a cough, gag, moving extremities spontaneously. Remained on small dose of pressors. Goals of care discussions with concern for anoxic brain injury, patient was transition to a DNR status CT head on Apr 25, 2024 did not show an acute injury, encephalomalacia consistent with a chronic infarct.  MRI brain 7/28 did reveal remote right parieto-occipital insult, no evidence of acute intracranial abnormality Patient remains encephalopathic, worsening myoclonus. Continue goals of care discussions with family and with no significant improvement in neurological status decision was made to transition to comfort measures Patient was palliatively extubated 04/28/19/2025  Succumbed to his  illness 2024-05-03 at 0836 hrs.  Anoxic brain injury Systolic and diastolic heart failure    Pertinent Labs and Studies  Significant Diagnostic Studies MR BRAIN W WO CONTRAST Result Date: 04/23/2024 CLINICAL DATA:  Evaluate for anoxic brain injury EXAM: MRI HEAD WITHOUT AND WITH CONTRAST TECHNIQUE: Multiplanar, multiecho pulse sequences of the brain and surrounding structures were obtained without and with intravenous contrast. CONTRAST:  7mL GADAVIST  GADOBUTROL  1 MMOL/ML IV SOLN COMPARISON:  CT head April 25, 2024. FINDINGS: Brain: No acute infarction, hemorrhage, hydrocephalus, extra-axial collection or mass lesion. Remote right parieto-occipital insult with white matter thinning/volume loss and ex vacuo ventricular dilation. Cerebral atrophy. No abnormal enhancement. Vascular: Normal flow voids. Skull and upper cervical spine: Normal marrow signal. Sinuses/Orbits: Mostly clear sinuses.  No acute orbital findings. IMPRESSION: 1. No evidence of acute intracranial abnormality. 2. Remote right parieto-occipital insult Electronically Signed   By: Gilmore GORMAN Molt M.[name].   On: 04/23/2024 20:08   Discontinue Long Term Monitor EEG Result Date: 04/23/2024 Maurine Jurist, MD     04/23/2024 12:05 PM Continuous Video-EEG Monitoring Report CPT/Type of Study: 95720 (EEG with video, 12-26 hours)      Indications for Procedure: Altered mental status Primary neurological diagnosis: G93.1; I46.9 Duration of study: 04/22/2024 07:30 to 04/23/2024 09:15 History: This is a 73 year old male presenting with altered mental status after cardiac arrest. Continuous video-EEG monitoring was performed to evaluate for seizure. Technical Description: The EEG was performed using standard setting per the guidelines of American Clinical Neurophysiology Society (ACNS). A minimum of 21 electrodes were placed on scalp according to the International 10-20 or/and 10-10 Systems. Supplemental electrodes were placed as needed. Single EKG electrode was  also used to detect cardiac arrhythmia. Patient's behavior was continuously recorded on video simultaneously with EEG. A minimum of  16 channels were used for data display. Each epoch of study was reviewed manually daily and as needed using standard referential and bipolar montages. Computerized quantitative EEG analysis (such as compressed spectral array analysis, trending, automated spike & seizure detection) were used as indicated. EEG Description: Epoch: 04/22/2024 07:30 to 04/23/2024 09:15 Background: There was generalized polymorphic delta and theta slowing. No discernible posterior dominant rhythm or sleep architecture was recorded. Diffuse beta activity was seen, likely due to sedative medication effect (propofol ). PERIODIC OR RHYTHMIC PATTERNS: None. EPILEPTIFORM DISCHARGES: None. SEIZURES: None. EVENTS: Intermittent myoclonic movement of head or upper body was captured which did not have EEG correlate. EKG: No significant arrhythmia. Impression: This is an abnormal EEG due to the presence of generalized polymorphic delta and theta slowing, suggesting moderate to severe encephalopathy, which is nonspecific as to etiology (medication effect of propofol  could not be ruled out). No epileptiform discharge or seizure is present. The captured myoclonic movement of head or upper body has no EEG correlate.   DG CHEST PORT 1 VIEW Result Date: 04/22/2024 CLINICAL DATA:  Acute respiratory failure. Endotracheally intubated. EXAM: PORTABLE CHEST 1 VIEW COMPARISON:  04/20/2024 FINDINGS: Endotracheal tube, nasogastric tube, and right jugular center venous catheter remain appropriate position. No pneumothorax visualized. Heart size is normal. Increased atelectasis or consolidation is seen in the left retrocardiac lung base. Probable small left pleural effusion. IMPRESSION: Increased left retrocardiac atelectasis or consolidation. Probable small left pleural effusion. Electronically Signed   By: Norleen DELENA Kil M.[name].   On:  04/22/2024 18:20   Overnight EEG with video Result Date: 04/22/2024 Gregg Lek, MD     04/22/2024  8:14 AM Patient Name: Mitchell Dockendorf Sr. MRN: [id number] Epilepsy Attending: Lek Gregg Referring Physician/Provider: Date: 04/22/2024 Start time: 7/26 at 0921 End Time: 7/27 at 0730 Patient history: 73 year old man, post cardiac arrest, down for 14 mins. EEG to evaluate for seizure Level of alertness: lethargic AEDs during EEG study: LEV,Prop Technical aspects: This EEG study was done with scalp electrodes positioned according to the 10-20 International system of electrode placement. Electrical activity was reviewed with band pass filter of 1-70Hz , sensitivity of 7 uV/mm, display speed of 27mm/sec with a 60Hz  notched filter applied as appropriate. EEG data were recorded continuously and digitally stored.  Video monitoring was available and reviewed as appropriate. Description: EEG showed continuous generalized polymorphic sharply contoured 3 to 6 Hz theta-delta slowing. There were a couple events describes as tremors of the jaws and eyes. Concomitant EEG before, during and after the event did not show any EEG changes suggest seizure. Hyperventilation and photic stimulation were not performed.   ABNORMALITY - Continuous slow, generalized IMPRESSION: This study is suggestive of mild to moderate diffuse encephalopathy, nonspecific etiology but related to sedation, toxic-metabolic etiology, anoxic/hypoxic brain injury. No seizures or epileptiform discharges were seen throughout the recording. Lek Gregg MD Neurology    ECHOCARDIOGRAM COMPLETE Result Date: 04/20/2024    ECHOCARDIOGRAM REPORT   Patient Name:   Orion Mole Sr. Date of Exam: 04/20/2024 Medical Rec #:  [id number]     Height:       68.0 in Accession #:    [id number]    Weight:       168.0 lb Date of Birth:  15-May-1951     BSA:          1.898 m Patient Age:    73 years      BP:           149/80 mmHg Patient Gender:  M             HR:           77 bpm. Exam  Location:  Inpatient Procedure: 2D Echo, 3D Echo, Cardiac Doppler, Color Doppler and Strain Analysis            (Both Spectral and Color Flow Doppler were utilized during            procedure). Indications:    Cardiac Arrest I46.9  History:        Patient has prior history of Echocardiogram examinations, most                 recent 06/08/2018. CHF, Chronic Kidney Disease; Risk                 Factors:Alcohol  Abuse, Hypertension and Dyslipidemia.  Sonographer:    Koleen Popper RDCS Referring Phys: [id number] JESSICA MARSHALL  Sonographer Comments: Global longitudinal strain was attempted. IMPRESSIONS  1. Left ventricular ejection fraction, by estimation, is 25 to 30%. Left ventricular ejection fraction by 3D volume is 35 %. The left ventricle has severely decreased function. The left ventricle demonstrates global hypokinesis. Left ventricular diastolic parameters are consistent with Grade I diastolic dysfunction (impaired relaxation). The average left ventricular global longitudinal strain is -9.7 %. The global longitudinal strain is abnormal.  2. Right ventricular systolic function is normal. The right ventricular size is normal. There is mildly elevated pulmonary artery systolic pressure.  3. The mitral valve is normal in structure. Mild mitral valve regurgitation. No evidence of mitral stenosis.  4. The aortic valve is tricuspid. There is mild calcification of the aortic valve. There is mild thickening of the aortic valve. Aortic valve regurgitation is mild to moderate. No aortic stenosis is present. FINDINGS  Left Ventricle: Left ventricular ejection fraction, by estimation, is 25 to 30%. Left ventricular ejection fraction by 3D volume is 35 %. The left ventricle has severely decreased function. The left ventricle demonstrates global hypokinesis. The average  left ventricular global longitudinal strain is -9.7 %. Strain was performed and the global longitudinal strain is abnormal. The left ventricular internal  cavity size was normal in size. There is no left ventricular hypertrophy. Left ventricular diastolic parameters are consistent with Grade I diastolic dysfunction (impaired relaxation). Right Ventricle: The right ventricular size is normal. No increase in right ventricular wall thickness. Right ventricular systolic function is normal. There is mildly elevated pulmonary artery systolic pressure. The tricuspid regurgitant velocity is 2.34  m/s, and with an assumed right atrial pressure of 15 mmHg, the estimated right ventricular systolic pressure is 36.9 mmHg. Left Atrium: Left atrial size was normal in size. Right Atrium: Right atrial size was normal in size. Pericardium: There is no evidence of pericardial effusion. Mitral Valve: The mitral valve is normal in structure. Mild mitral valve regurgitation. No evidence of mitral valve stenosis. Tricuspid Valve: The tricuspid valve is normal in structure. Tricuspid valve regurgitation is mild . No evidence of tricuspid stenosis. Aortic Valve: The aortic valve is tricuspid. There is mild calcification of the aortic valve. There is mild thickening of the aortic valve. Aortic valve regurgitation is mild to moderate. No aortic stenosis is present. Aortic valve mean gradient measures  4.0 mmHg. Aortic valve peak gradient measures 6.7 mmHg. Aortic valve area, by VTI measures 2.42 cm. Pulmonic Valve: The pulmonic valve was normal in structure. Pulmonic valve regurgitation is not visualized. No evidence of pulmonic stenosis. Aorta: The aortic root is normal in size and  structure. Venous: IVC assessment for right atrial pressure unable to be performed due to mechanical ventilation. IAS/Shunts: No atrial level shunt detected by color flow Doppler. Additional Comments: 3D was performed not requiring image post processing on an independent workstation and was abnormal.  LEFT VENTRICLE PLAX 2D LVIDd:         4.60 cm         Diastology LVIDs:         4.20 cm         LV e' medial:     3.44 cm/s LV PW:         1.00 cm         LV E/e' medial:  13.5 LV IVS:        1.60 cm         LV e' lateral:   10.80 cm/s LVOT diam:     2.10 cm         LV E/e' lateral: 4.3 LV SV:         66 LV SV Index:   35              2D Longitudinal LVOT Area:     3.46 cm        Strain                                2D Strain GLS   -9.7 %                                Avg: LV Volumes (MOD) LV vol [name], MOD    291.0 ml      3D Volume EF A2C:                           LV 3D EF:    Left LV vol s, MOD    187.0 ml                   ventricul A2C:                                        ar LV SV MOD A2C:   104.0 ml                   ejection                                             fraction                                             by 3D                                             volume is  35 %.                                 3D Volume EF:                                3D EF:        35 %                                LV EDV:       317 ml                                LV ESV:       205 ml                                LV SV:        112 ml RIGHT VENTRICLE             IVC RV Basal diam:  3.80 cm     IVC diam: 3.00 cm RV S prime:     15.10 cm/s TAPSE (M-mode): 2.3 cm LEFT ATRIUM           Index        RIGHT ATRIUM           Index LA diam:      3.70 cm 1.95 cm/m   RA Area:     16.50 cm LA Vol (A2C): 60.7 ml 31.99 ml/m  RA Volume:   41.30 ml  21.76 ml/m LA Vol (A4C): 68.6 ml 36.15 ml/m  AORTIC VALVE AV Area (Vmax):    2.71 cm AV Area (Vmean):   2.58 cm AV Area (VTI):     2.42 cm AV Vmax:           129.00 cm/s AV Vmean:          88.000 cm/s AV VTI:            0.272 m AV Peak Grad:      6.7 mmHg AV Mean Grad:      4.0 mmHg LVOT Vmax:         101.00 cm/s LVOT Vmean:        65.500 cm/s LVOT VTI:          0.190 m LVOT/AV VTI ratio: 0.70  AORTA Ao Root diam: 3.60 cm Ao Asc diam:  3.40 cm MITRAL VALVE               TRICUSPID VALVE MV Area (PHT): 3.46 cm    TR Peak grad:   21.9 mmHg MV  Decel Time: 219 msec    TR Vmax:        234.00 cm/s MV E velocity: 46.60 cm/s MV A velocity: 90.10 cm/s  SHUNTS MV E/A ratio:  0.52        Systemic VTI:  0.19 m                            Systemic Diam: 2.10 cm Morene Brownie Electronically signed by Morene Brownie Signature Date/Time: 04/20/2024/2:35:21 PM    Final    CT HEAD WO CONTRAST  Result Date: 04/20/2024 CLINICAL DATA:  Cardiac arrest, intubated, mental status changes. EXAM: CT HEAD WITHOUT CONTRAST TECHNIQUE: Contiguous axial images were obtained from the base of the skull through the vertex without intravenous contrast. RADIATION DOSE REDUCTION: This exam was performed according to the departmental dose-optimization program which includes automated exposure control, adjustment of the mA and/or kV according to patient size and/or use of iterative reconstruction technique. COMPARISON:  MRI brain 06/08/2018. FINDINGS: Brain: New broad area of right parieto-occipital encephalomalacia is noted, with ex vacuo expansion of the posterior horn right lateral ventricle, consistent with either a late-subacute or more likely a chronic infarct. Some extension of encephalomalacia to the occipitotemporal watershed also appears present. Elsewhere there is mild cerebral atrophy and small-vessel disease with normal volume and attenuation in the cerebellum and brainstem. Rest of the ventricles are normal in size. No acute cortical based infarct, hemorrhage, mass effect or midline shift are seen. Basal cisterns are clear. Vascular: Patchy calcific plaque both siphons. No hyperdense central vessel is seen. Skull: Negative for fractures or focal lesions. Sinuses/Orbits: No acute abnormality. Interval right lens replacement. There is a dysconjugate gaze, otherwise negative orbits. Clear sinuses and mastoids apart from a 1 cm retention cyst in the floor of the left maxillary sinus. Other: Oro midline nasal septum. Tracheal and oroenteric intubation partially visible.  IMPRESSION: 1. No acute intracranial CT findings. 2. New broad area of right parieto-occipital encephalomalacia with ex vacuo expansion of the posterior horn right lateral ventricle, consistent with either a late-subacute or more likely a chronic infarct. 3. Mild atrophy and small-vessel disease. 4. Dysconjugate gaze. Electronically Signed   By: Francis Quam M.[name].   On: 04/20/2024 04:35   DG Abd Portable 1V Result Date: 04/20/2024 EXAM: 1 VIEW XRAY OF THE ABDOMEN 04/20/2024 03:12:24 AM COMPARISON: None available. CLINICAL HISTORY: Encounter for OG tube placement. FINDINGS: BOWEL: Nonobstructive bowel gas pattern. SOFT TISSUES: No abnormal calcifications or opaque urinary calculi. Enteric tube terminates in the gastric cardia, in satisfactory position. BONES: No acute osseous abnormality. IMPRESSION: 1. Enteric tube terminates in the gastric cardia, in satisfactory position. Electronically signed by: Pinkie Pebbles MD 04/20/2024 03:17 AM EDT RP Workstation: [id number]   DG Chest Port 1 View Result Date: 04/20/2024 EXAM: 1 VIEW XRAY OF THE CHEST 04/20/2024 03:12:24 AM COMPARISON: 03/02/2024 CLINICAL HISTORY: Respiratory failure. FINDINGS: LUNGS AND PLEURA: Increased interstitial markings, right greater than left, suggesting asymmetric interstitial edema over aspiration. No focal pulmonary opacity. No pleural effusion. No pneumothorax. HEART AND MEDIASTINUM: No acute abnormality of the cardiac and mediastinal silhouettes. BONES AND SOFT TISSUES: No acute osseous abnormality. Defibrillator pads overlying the left hemithorax. LINES AND TUBES: Right IJ venous catheter terminates in the right atrium. Endotracheal tube terminates 2.5 cm above the carina. Lines and tubes are in appropriate position. IMPRESSION: 1. Increased interstitial markings, favoring asymmetric interstitial edema over aspiration. 2. Support apparatus as above. Electronically signed by: Pinkie Pebbles MD 04/20/2024 03:17 AM EDT RP Workstation:  [id number]    Microbiology Recent Results (from the past 240 hours)  Culture, blood (Routine X 2) w Reflex to ID Panel     Status: None   Collection Time: 04/20/24  3:00 AM   Specimen: BLOOD  Result Value Ref Range Status   Specimen Description BLOOD RIGHT ANTECUBITAL  Final   Special Requests   Final    BOTTLES DRAWN AEROBIC AND ANAEROBIC Blood Culture adequate volume   Culture   Final    NO GROWTH 5 DAYS Performed at [facility],  [address], [place], [place] [address]    Report Status 04/25/2024 FINAL  Final  MRSA Next Gen by PCR, Nasal     Status: None   Collection Time: 04/20/24  5:14 AM   Specimen: Nasal Mucosa; Nasal Swab  Result Value Ref Range Status   MRSA by PCR Next Gen NOT DETECTED NOT DETECTED Final    Comment: (NOTE) The GeneXpert MRSA Assay (FDA approved for NASAL specimens only), is one component of a comprehensive MRSA colonization surveillance program. It is not intended to diagnose MRSA infection nor to guide or monitor treatment for MRSA infections. Test performance is not FDA approved in patients less than 22 years old. Performed at [facility], [address], [place], [place] [address]   Culture, blood (Routine X 2) w Reflex to ID Panel     Status: None   Collection Time: 04/20/24  8:22 AM   Specimen: BLOOD RIGHT HAND  Result Value Ref Range Status   Specimen Description BLOOD RIGHT HAND  Final   Special Requests   Final    BOTTLES DRAWN AEROBIC ONLY Blood Culture results may not be optimal due to an inadequate volume of blood received in culture bottles   Culture   Final    NO GROWTH 5 DAYS Performed at [facility], [address], [place], [place] [address]    Report Status 04/25/2024 FINAL  Final    Lab Basic Metabolic Panel: Recent Labs  Lab 04/22/24 [phone] 04/23/24 0239 04/24/24 0546 04/24/24 0547 04/25/24 0409 04/26/24 0255  NA 133* 133* 131*  --   --  138  K 3.6 3.0* 3.0*  --   --  4.2  CL 96* 96* 95*  --    --  100  CO2 22 22 23   --   --  27  GLUCOSE 85 95 127*  --   --  143*  BUN 20 27* 44*  --   --  48*  CREATININE 3.59* 5.19* 5.86*  --   --  5.60*  CALCIUM  8.2* 8.0* 8.0*  --   --  8.1*  MG 1.7  --   --  1.9 1.9  --   PHOS 2.9  --   --  4.2 1.8* 3.2   Liver Function Tests: Recent Labs  Lab 04/26/24 0255  ALBUMIN 2.3*   No results for input(s): LIPASE, AMYLASE in the last 168 hours. No results for input(s): AMMONIA in the last 168 hours. CBC: Recent Labs  Lab 04/24/24 0547 04/25/24 0409 04/26/24 0255 04/27/24 0348 05/21/24 0214  WBC 5.5 4.4 7.3 8.0 9.0  HGB 8.9* 8.9* 8.8* 8.5* 8.4*  HCT 26.9* 26.7* 26.7* 26.1* 26.2*  MCV 84.3 84.0 84.2 84.5 86.5  PLT 159 163 179 202 249   Cardiac Enzymes: No results for input(s): CKTOTAL, CKMB, CKMBINDEX, TROPONINI in the last 168 hours. Sepsis Labs: Recent Labs  Lab 04/25/24 0409 04/26/24 0255 04/27/24 0348 05/21/24 0214  WBC 4.4 7.3 8.0 9.0    Procedures/Operations     Niemah Schwebke A Lashell Moffitt 2024/05/21, 10:13 AM

## 2024-05-28 NOTE — Progress Notes (Signed)
 Pt continuing comfort measures and laying in bed w needs meet and comfortable.

## 2024-05-28 DEATH — deceased

## 2024-06-13 ENCOUNTER — Ambulatory Visit: Admitting: Neurology

## 2024-09-04 ENCOUNTER — Ambulatory Visit: Payer: Medicare Other | Admitting: Neurology

## 2024-09-05 ENCOUNTER — Other Ambulatory Visit

## 2024-09-12 ENCOUNTER — Ambulatory Visit: Admitting: Hematology and Oncology
